# Patient Record
Sex: Female | Born: 1945 | Race: White | Hispanic: No | Smoking: Never smoker
Health system: Southern US, Community
[De-identification: ages and names within clinical notes are randomized; demographics above are authoritative.]

## PROBLEM LIST (undated history)

## (undated) DIAGNOSIS — M069 Rheumatoid arthritis, unspecified: Secondary | ICD-10-CM

## (undated) DIAGNOSIS — D369 Benign neoplasm, unspecified site: Secondary | ICD-10-CM

## (undated) DIAGNOSIS — M810 Age-related osteoporosis without current pathological fracture: Secondary | ICD-10-CM

## (undated) DIAGNOSIS — G809 Cerebral palsy, unspecified: Secondary | ICD-10-CM

## (undated) DIAGNOSIS — I10 Essential (primary) hypertension: Secondary | ICD-10-CM

## (undated) DIAGNOSIS — M1990 Unspecified osteoarthritis, unspecified site: Secondary | ICD-10-CM

## (undated) DIAGNOSIS — J189 Pneumonia, unspecified organism: Secondary | ICD-10-CM

## (undated) DIAGNOSIS — I509 Heart failure, unspecified: Secondary | ICD-10-CM

## (undated) DIAGNOSIS — S7290XA Unspecified fracture of unspecified femur, initial encounter for closed fracture: Secondary | ICD-10-CM

## (undated) DIAGNOSIS — Z9289 Personal history of other medical treatment: Secondary | ICD-10-CM

## (undated) HISTORY — PX: COLONOSCOPY: SHX174

## (undated) HISTORY — DX: Pneumonia, unspecified organism: J18.9

## (undated) HISTORY — PX: TOTAL KNEE ARTHROPLASTY: SHX125

## (undated) HISTORY — PX: JOINT REPLACEMENT: SHX530

## (undated) HISTORY — PX: CORONARY ANGIOPLASTY WITH STENT PLACEMENT: SHX49

## (undated) HISTORY — DX: Heart failure, unspecified: I50.9

---

## 1978-07-17 HISTORY — PX: BREAST ENHANCEMENT SURGERY: SHX7

## 1998-07-17 HISTORY — PX: HIP ARTHROPLASTY: SHX981

## 2007-09-18 DIAGNOSIS — F4321 Adjustment disorder with depressed mood: Secondary | ICD-10-CM | POA: Insufficient documentation

## 2007-09-18 DIAGNOSIS — F4320 Adjustment disorder, unspecified: Secondary | ICD-10-CM | POA: Insufficient documentation

## 2007-09-18 DIAGNOSIS — I10 Essential (primary) hypertension: Secondary | ICD-10-CM | POA: Insufficient documentation

## 2007-09-18 DIAGNOSIS — G809 Cerebral palsy, unspecified: Secondary | ICD-10-CM | POA: Insufficient documentation

## 2007-09-18 DIAGNOSIS — A6000 Herpesviral infection of urogenital system, unspecified: Secondary | ICD-10-CM | POA: Insufficient documentation

## 2008-06-02 ENCOUNTER — Ambulatory Visit: Payer: Self-pay | Admitting: Gastroenterology

## 2008-07-03 DIAGNOSIS — R87629 Unspecified abnormal cytological findings in specimens from vagina: Secondary | ICD-10-CM | POA: Insufficient documentation

## 2008-07-03 DIAGNOSIS — Z860101 Personal history of adenomatous and serrated colon polyps: Secondary | ICD-10-CM | POA: Insufficient documentation

## 2008-07-03 DIAGNOSIS — Z86010 Personal history of colonic polyps: Secondary | ICD-10-CM | POA: Insufficient documentation

## 2008-07-03 DIAGNOSIS — M810 Age-related osteoporosis without current pathological fracture: Secondary | ICD-10-CM | POA: Insufficient documentation

## 2008-07-11 ENCOUNTER — Inpatient Hospital Stay: Payer: Self-pay | Admitting: Internal Medicine

## 2008-08-11 ENCOUNTER — Ambulatory Visit: Payer: Self-pay | Admitting: Internal Medicine

## 2008-08-19 ENCOUNTER — Ambulatory Visit: Payer: Self-pay | Admitting: Internal Medicine

## 2008-10-21 ENCOUNTER — Ambulatory Visit: Payer: Self-pay | Admitting: Family Medicine

## 2008-10-21 DIAGNOSIS — I2510 Atherosclerotic heart disease of native coronary artery without angina pectoris: Secondary | ICD-10-CM | POA: Insufficient documentation

## 2008-10-21 DIAGNOSIS — R319 Hematuria, unspecified: Secondary | ICD-10-CM | POA: Insufficient documentation

## 2009-06-23 ENCOUNTER — Ambulatory Visit: Payer: Self-pay | Admitting: Gastroenterology

## 2009-09-18 DIAGNOSIS — M542 Cervicalgia: Secondary | ICD-10-CM | POA: Insufficient documentation

## 2009-11-29 ENCOUNTER — Ambulatory Visit: Payer: Self-pay | Admitting: Family Medicine

## 2009-11-29 DIAGNOSIS — J309 Allergic rhinitis, unspecified: Secondary | ICD-10-CM | POA: Insufficient documentation

## 2011-10-09 ENCOUNTER — Ambulatory Visit: Payer: Self-pay | Admitting: Cardiology

## 2013-04-07 DIAGNOSIS — I890 Lymphedema, not elsewhere classified: Secondary | ICD-10-CM | POA: Insufficient documentation

## 2013-05-23 LAB — HM MAMMOGRAPHY: HM MAMMO: NORMAL

## 2013-06-02 ENCOUNTER — Ambulatory Visit: Payer: Self-pay | Admitting: Gastroenterology

## 2013-06-02 LAB — HM COLONOSCOPY

## 2013-06-03 LAB — PATHOLOGY REPORT

## 2013-10-07 DIAGNOSIS — M059 Rheumatoid arthritis with rheumatoid factor, unspecified: Secondary | ICD-10-CM | POA: Insufficient documentation

## 2013-10-07 DIAGNOSIS — M545 Low back pain: Secondary | ICD-10-CM

## 2013-10-07 DIAGNOSIS — G8929 Other chronic pain: Secondary | ICD-10-CM | POA: Insufficient documentation

## 2013-10-07 DIAGNOSIS — G809 Cerebral palsy, unspecified: Secondary | ICD-10-CM | POA: Insufficient documentation

## 2013-12-19 LAB — HM PAP SMEAR: HM Pap smear: NORMAL

## 2014-11-18 DIAGNOSIS — M069 Rheumatoid arthritis, unspecified: Secondary | ICD-10-CM | POA: Insufficient documentation

## 2014-11-18 DIAGNOSIS — W19XXXA Unspecified fall, initial encounter: Secondary | ICD-10-CM | POA: Insufficient documentation

## 2014-11-18 DIAGNOSIS — E559 Vitamin D deficiency, unspecified: Secondary | ICD-10-CM | POA: Insufficient documentation

## 2014-11-18 DIAGNOSIS — E038 Other specified hypothyroidism: Secondary | ICD-10-CM | POA: Insufficient documentation

## 2014-11-18 DIAGNOSIS — Z6823 Body mass index (BMI) 23.0-23.9, adult: Secondary | ICD-10-CM | POA: Insufficient documentation

## 2014-11-18 DIAGNOSIS — M19049 Primary osteoarthritis, unspecified hand: Secondary | ICD-10-CM | POA: Insufficient documentation

## 2014-11-18 DIAGNOSIS — G4700 Insomnia, unspecified: Secondary | ICD-10-CM | POA: Insufficient documentation

## 2014-11-18 DIAGNOSIS — R32 Unspecified urinary incontinence: Secondary | ICD-10-CM | POA: Insufficient documentation

## 2014-11-18 DIAGNOSIS — E039 Hypothyroidism, unspecified: Secondary | ICD-10-CM | POA: Insufficient documentation

## 2014-11-18 DIAGNOSIS — E7800 Pure hypercholesterolemia, unspecified: Secondary | ICD-10-CM | POA: Insufficient documentation

## 2015-01-21 ENCOUNTER — Ambulatory Visit (INDEPENDENT_AMBULATORY_CARE_PROVIDER_SITE_OTHER): Payer: Medicare PPO | Admitting: Family Medicine

## 2015-01-21 ENCOUNTER — Encounter: Payer: Self-pay | Admitting: Family Medicine

## 2015-01-21 VITALS — BP 116/74 | HR 76 | Temp 97.5°F | Resp 16 | Wt 118.0 lb

## 2015-01-21 DIAGNOSIS — N393 Stress incontinence (female) (male): Secondary | ICD-10-CM

## 2015-01-21 DIAGNOSIS — E039 Hypothyroidism, unspecified: Secondary | ICD-10-CM

## 2015-01-21 DIAGNOSIS — I10 Essential (primary) hypertension: Secondary | ICD-10-CM | POA: Diagnosis not present

## 2015-01-21 DIAGNOSIS — M069 Rheumatoid arthritis, unspecified: Secondary | ICD-10-CM

## 2015-01-21 DIAGNOSIS — E038 Other specified hypothyroidism: Secondary | ICD-10-CM | POA: Diagnosis not present

## 2015-01-21 DIAGNOSIS — E7800 Pure hypercholesterolemia, unspecified: Secondary | ICD-10-CM

## 2015-01-21 DIAGNOSIS — R110 Nausea: Secondary | ICD-10-CM | POA: Diagnosis not present

## 2015-01-21 MED ORDER — HYDROCODONE-ACETAMINOPHEN 7.5-325 MG PO TABS: 2.0000 | ORAL_TABLET | Freq: Four times a day (QID) | ORAL | Status: DC

## 2015-01-21 MED ORDER — PROMETHAZINE HCL 25 MG PO TABS: 25.0000 mg | ORAL_TABLET | ORAL | Status: DC | PRN

## 2015-01-21 MED ORDER — CYCLOBENZAPRINE HCL 10 MG PO TABS
10.0000 mg | ORAL_TABLET | Freq: Three times a day (TID) | ORAL | Status: DC | PRN
Start: 2015-01-21 — End: 2015-09-10

## 2015-01-21 MED ORDER — TOLTERODINE TARTRATE ER 4 MG PO CP24: 4.0000 mg | ORAL_CAPSULE | Freq: Every day | ORAL | Status: DC
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
## 2015-01-22 ENCOUNTER — Other Ambulatory Visit: Payer: Self-pay | Admitting: Family Medicine

## 2015-01-22 DIAGNOSIS — R110 Nausea: Secondary | ICD-10-CM

## 2015-01-22 MED ORDER — PROMETHAZINE HCL 25 MG PO TABS: 25.0000 mg | ORAL_TABLET | ORAL | Status: DC | PRN

## 2015-01-23 ENCOUNTER — Other Ambulatory Visit: Payer: Self-pay | Admitting: Family Medicine

## 2015-01-23 DIAGNOSIS — F4322 Adjustment disorder with anxiety: Secondary | ICD-10-CM

## 2015-01-25 NOTE — Telephone Encounter (Signed)
Please call in rx.  Thanks.  

## 2015-02-26 ENCOUNTER — Ambulatory Visit: Payer: Self-pay | Admitting: Family Medicine

## 2015-02-26 ENCOUNTER — Telehealth: Payer: Self-pay | Admitting: Family Medicine

## 2015-03-12 ENCOUNTER — Ambulatory Visit (INDEPENDENT_AMBULATORY_CARE_PROVIDER_SITE_OTHER): Payer: Medicare PPO | Admitting: Family Medicine

## 2015-03-12 ENCOUNTER — Encounter: Payer: Self-pay | Admitting: Family Medicine

## 2015-03-12 VITALS — BP 112/70 | HR 72 | Temp 98.3°F | Resp 16 | Wt 119.0 lb

## 2015-03-12 DIAGNOSIS — R110 Nausea: Secondary | ICD-10-CM | POA: Diagnosis not present

## 2015-03-12 DIAGNOSIS — K5900 Constipation, unspecified: Secondary | ICD-10-CM

## 2015-03-12 DIAGNOSIS — R52 Pain, unspecified: Secondary | ICD-10-CM

## 2015-03-12 DIAGNOSIS — N393 Stress incontinence (female) (male): Secondary | ICD-10-CM

## 2015-03-12 DIAGNOSIS — I10 Essential (primary) hypertension: Secondary | ICD-10-CM | POA: Diagnosis not present

## 2015-03-12 DIAGNOSIS — Z5189 Encounter for other specified aftercare: Secondary | ICD-10-CM | POA: Diagnosis not present

## 2015-03-12 MED ORDER — ONDANSETRON HCL 4 MG PO TABS: 4.0000 mg | ORAL_TABLET | Freq: Three times a day (TID) | ORAL | Status: DC | PRN

## 2015-03-12 MED ORDER — SENNOSIDES-DOCUSATE SODIUM 8.6-50 MG PO TABS: 1.0000 | ORAL_TABLET | Freq: Two times a day (BID) | ORAL | Status: DC

## 2015-03-12 NOTE — Progress Notes (Signed)
Patient ID: Tina Salinas, female   DOB: August 02, 1945, 69 y.o.   MRN: [id number]         Patient: Tina Salinas Female    DOB: 24-Oct-1945   69 y.o.   MRN: [id number] Visit Date: 03/12/2015  Today's Provider: Margarita Rana, MD   Chief Complaint  Patient presents with  . Constipation  . Hypertension   Subjective:    Constipation This is a chronic problem. The problem is unchanged. The stool is described as firm. The patient is on a high fiber diet. She exercises regularly. There has been adequate water intake. Pertinent negatives include no abdominal pain, back pain, bloating, diarrhea, fever, nausea, rectal pain or vomiting. Risk factors include change in medication usage/dosage.  Hypertension This is a chronic problem. The problem is controlled. Pertinent negatives include no blurred vision, chest pain, headaches, malaise/fatigue, palpitations or shortness of breath. There are no compliance problems.   Arthritis Presents for follow-up visit. The disease course has been stable. She complains of pain and stiffness. The symptoms have been stable. Pertinent negatives include no diarrhea, fatigue or fever. Her past medical history is significant for rheumatoid arthritis. Past treatments include methotrexate and corticosteroids. The treatment provided significant relief. Compliance with prior treatments has been good.       Allergies  Allergen Reactions  . Etodolac     Other reaction(s): Unknown  . Sulfa Antibiotics   . Tetracycline    Previous Medications   ALENDRONATE (FOSAMAX) 70 MG TABLET    Take by mouth once a week.   ALPRAZOLAM (XANAX) 0.5 MG TABLET    TAKE 1/2 TO 1 TABLET AT BEDTIME   ASPIRIN 81 MG TABLET    Take 81 mg by mouth daily.   CETIRIZINE HCL 10 MG CAPS    Take by mouth daily.   CHOLECALCIFEROL (VITAMIN D) 1000 UNITS TABLET    Take by mouth daily.   CYCLOBENZAPRINE (FLEXERIL) 10 MG TABLET    Take 1 tablet (10 mg total) by mouth 3 (three) times daily as needed.   DIPHENOXYLATE-ATROPINE (LOMOTIL) 2.5-0.025 MG PER TABLET    Take by mouth as needed.   ESCITALOPRAM (LEXAPRO) 5 MG TABLET    Take by mouth daily.   FLUTICASONE (FLONASE) 50 MCG/ACT NASAL SPRAY    Place 2 sprays into the nose daily.   FOLIC ACID (FOLVITE) 1 MG TABLET    Take by mouth daily.   HYDROCODONE-ACETAMINOPHEN (NORCO) 7.5-325 MG PER TABLET    Take 2 tablets by mouth QID. 3 months supply.   HYDROXYCHLOROQUINE (PLAQUENIL) 200 MG TABLET    Take by mouth daily.   IBUPROFEN (ADVIL,MOTRIN) 800 MG TABLET    Take 1 tablet by mouth 3 (three) times daily.   METHOTREXATE 2.5 MG TABLET    Take by mouth once a week.   METOPROLOL TARTRATE (LOPRESSOR) 25 MG TABLET    Take by mouth daily.   MONTELUKAST (SINGULAIR) 10 MG TABLET    Take by mouth daily.   PRAVASTATIN (PRAVACHOL) 40 MG TABLET    Take by mouth at bedtime.   PREDNISONE (DELTASONE) 1 MG TABLET       PROMETHAZINE (PHENERGAN) 25 MG TABLET    Take 1 tablet (25 mg total) by mouth every 4 (four) hours as needed for nausea.   TOLTERODINE (DETROL LA) 4 MG 24 HR CAPSULE    Take 1 capsule (4 mg total) by mouth daily.   VALACYCLOVIR (VALTREX) 1000 MG TABLET    as needed.  Review of Systems  Constitutional: Negative for fever, chills, malaise/fatigue, diaphoresis, activity change, appetite change, fatigue and unexpected weight change.  Eyes: Negative for blurred vision.  Respiratory: Negative for apnea, cough, choking, chest tightness, shortness of breath, wheezing and stridor.   Cardiovascular: Negative for chest pain, palpitations and leg swelling.  Gastrointestinal: Positive for constipation. Negative for nausea, vomiting, abdominal pain, diarrhea, blood in stool, abdominal distention, anal bleeding, rectal pain and bloating.  Musculoskeletal: Positive for arthritis and stiffness. Negative for back pain.  Neurological: Negative for dizziness, tremors, seizures, syncope, facial asymmetry, speech difficulty, weakness, light-headedness, numbness and  headaches.    Social History  Substance Use Topics  . Smoking status: Never Smoker   . Smokeless tobacco: Never Used  . Alcohol Use: No   Objective:   BP 112/70 mmHg  Pulse 72  Temp(Src) 98.3 F (36.8 C) (Oral)  Resp 16  Wt 119 lb (53.978 kg)  Physical Exam  Constitutional: She is oriented to person, place, and time. She appears well-developed and well-nourished.  Neurological: She is alert and oriented to person, place, and time. She has normal reflexes.  Psychiatric: She has a normal mood and affect. Her behavior is normal. Judgment and thought content normal.      Assessment & Plan:     1. Essential (primary) hypertension Condition is stable. Please continue current medication and  plan of care as noted.    2. Constipation, unspecified constipation type Stable on improved medication.  Continue current medication.   - senna-docusate (SENOKOT-S) 8.6-50 MG per tablet; Take 1 tablet by mouth 2 (two) times daily.  Dispense: 60 tablet; Refill: 5  3. Stress incontinence Unchanged.   4. Pain management Condition is stable. Please continue current medication and  plan of care as noted.   - Pain Management Screening Profile (10S)  5. Nausea Refill medication.   - ondansetron (ZOFRAN) 4 MG tablet; Take 1 tablet (4 mg total) by mouth every 8 (eight) hours as needed for nausea or vomiting.  Dispense: 20 tablet; Refill: 0      Margarita Rana, MD  [facility]

## 2015-03-13 LAB — PMP SCREEN PROFILE (10S), URINE
Amphetamine Screen, Ur: NEGATIVE ng/mL
BENZODIAZEPINE SCREEN, URINE: NEGATIVE ng/mL
Barbiturate Screen, Ur: NEGATIVE ng/mL
CREATININE(CRT), U: 50.5 mg/dL (ref 20.0–300.0)
Cannabinoids Ur Ql Scn: NEGATIVE ng/mL
Cocaine(Metab.)Screen, Urine: NEGATIVE ng/mL
Methadone Scn, Ur: NEGATIVE ng/mL
OXYCODONE+OXYMORPHONE UR QL SCN: NEGATIVE ng/mL
Opiate Scrn, Ur: POSITIVE ng/mL
PCP Scrn, Ur: NEGATIVE ng/mL
PROPOXYPHENE SCREEN: NEGATIVE ng/mL
Ph of Urine: 6.8 (ref 4.5–8.9)

## 2015-03-15 ENCOUNTER — Telehealth: Payer: Self-pay

## 2015-03-15 NOTE — Telephone Encounter (Signed)
Patient advised as directed below. Patient requested a copy. Copy printed, up front for pick up.

## 2015-03-15 NOTE — Telephone Encounter (Signed)
-----   Message from Margarita Rana, MD sent at 03/13/2015  2:08 PM EDT ----- Urine tox screen is as expected. Thanks.

## 2015-03-18 ENCOUNTER — Telehealth: Payer: Self-pay | Admitting: Family Medicine

## 2015-03-18 NOTE — Telephone Encounter (Signed)
Pt advised.  She will be due for her flu shot.  She will call back and schedule soon.   Thanks,   -Mickel Baas

## 2015-03-18 NOTE — Telephone Encounter (Signed)
Pt ask Mickel Baas to call her back regarding her pneumonia and flu vaccines.  CAll back is [phone]  Thanks Con Memos

## 2015-03-19 ENCOUNTER — Other Ambulatory Visit: Payer: Self-pay

## 2015-03-19 DIAGNOSIS — A6000 Herpesviral infection of urogenital system, unspecified: Secondary | ICD-10-CM

## 2015-03-19 MED ORDER — ACYCLOVIR 800 MG PO TABS: 800.0000 mg | ORAL_TABLET | Freq: Two times a day (BID) | ORAL | Status: DC

## 2015-03-19 NOTE — Telephone Encounter (Signed)
Pt called and wanted to let Mickel Baas know that she spoke with someone from the mail order pharmacy and she has one refill left and they are going to mail it to her. I asked what medication this was in regards to and she stated she doesn't know how to say it or spell it, just to let Mickel Baas know. Thanks TNP

## 2015-03-29 ENCOUNTER — Telehealth: Payer: Self-pay | Admitting: Family Medicine

## 2015-03-29 NOTE — Telephone Encounter (Signed)
Pt is requesting a call back from Mickel Baas to discuss her medications.  CB#[phone]/MW

## 2015-03-30 NOTE — Telephone Encounter (Signed)
Pt says her Acyclovir 800mg  1 three times a day for five days as needed and she says she normally gets 270 at a time.    Thanks,   -Mickel Baas

## 2015-03-30 NOTE — Telephone Encounter (Signed)
Pt stated that she needs Mickel Baas to call her back today because pt needs Mickel Baas to contact her mail order. Thanks TNP

## 2015-03-30 NOTE — Telephone Encounter (Signed)
LMTCB 03/30/2015   Thanks,   -Mickel Baas

## 2015-03-31 ENCOUNTER — Other Ambulatory Visit: Payer: Self-pay

## 2015-03-31 DIAGNOSIS — I10 Essential (primary) hypertension: Secondary | ICD-10-CM

## 2015-03-31 DIAGNOSIS — M069 Rheumatoid arthritis, unspecified: Secondary | ICD-10-CM

## 2015-03-31 MED ORDER — IBUPROFEN 800 MG PO TABS: 800.0000 mg | ORAL_TABLET | Freq: Three times a day (TID) | ORAL | Status: DC

## 2015-03-31 MED ORDER — METOPROLOL TARTRATE 25 MG PO TABS: 25.0000 mg | ORAL_TABLET | Freq: Two times a day (BID) | ORAL | Status: DC

## 2015-04-08 ENCOUNTER — Ambulatory Visit: Payer: Self-pay

## 2015-04-12 DIAGNOSIS — Z96659 Presence of unspecified artificial knee joint: Secondary | ICD-10-CM | POA: Insufficient documentation

## 2015-04-12 DIAGNOSIS — T849XXA Unspecified complication of internal orthopedic prosthetic device, implant and graft, initial encounter: Secondary | ICD-10-CM | POA: Insufficient documentation

## 2015-04-12 DIAGNOSIS — R269 Unspecified abnormalities of gait and mobility: Secondary | ICD-10-CM | POA: Insufficient documentation

## 2015-04-17 ENCOUNTER — Ambulatory Visit (INDEPENDENT_AMBULATORY_CARE_PROVIDER_SITE_OTHER): Payer: Medicare PPO

## 2015-04-17 DIAGNOSIS — Z23 Encounter for immunization: Secondary | ICD-10-CM

## 2015-05-14 ENCOUNTER — Ambulatory Visit (INDEPENDENT_AMBULATORY_CARE_PROVIDER_SITE_OTHER): Payer: Medicare PPO | Admitting: Family Medicine

## 2015-05-14 ENCOUNTER — Telehealth: Payer: Self-pay | Admitting: Family Medicine

## 2015-05-14 ENCOUNTER — Encounter: Payer: Self-pay | Admitting: Family Medicine

## 2015-05-14 VITALS — BP 116/72 | HR 80 | Temp 98.3°F | Resp 16 | Wt 120.0 lb

## 2015-05-14 DIAGNOSIS — M059 Rheumatoid arthritis with rheumatoid factor, unspecified: Secondary | ICD-10-CM

## 2015-05-14 DIAGNOSIS — M05749 Rheumatoid arthritis with rheumatoid factor of unspecified hand without organ or systems involvement: Secondary | ICD-10-CM

## 2015-05-14 DIAGNOSIS — M158 Other polyosteoarthritis: Secondary | ICD-10-CM | POA: Diagnosis not present

## 2015-05-14 DIAGNOSIS — M1990 Unspecified osteoarthritis, unspecified site: Secondary | ICD-10-CM | POA: Insufficient documentation

## 2015-05-14 MED ORDER — HYDROCODONE-ACETAMINOPHEN 7.5-325 MG PO TABS: 2.0000 | ORAL_TABLET | Freq: Four times a day (QID) | ORAL | Status: DC

## 2015-05-14 NOTE — Telephone Encounter (Signed)
LMTCB 05/14/2015  Thanks,   -Mickel Baas

## 2015-05-14 NOTE — Telephone Encounter (Signed)
Please triage. Thanks.

## 2015-05-14 NOTE — Telephone Encounter (Signed)
Pt just left office but forgot to tell you that she was having some ear pain,   Please call back at  [phone]  Thanks Con Memos

## 2015-05-14 NOTE — Progress Notes (Signed)
Patient ID: Tina Salinas, female   DOB: Sep 12, 1945, 69 y.o.   MRN: [id number]         Patient: Tina Salinas Female    DOB: 07-05-1946   69 y.o.   MRN: [id number] Visit Date: 05/14/2015  Today's Provider: Margarita Rana, MD   Chief Complaint  Patient presents with  . Rheumatoid Arthritis   Subjective:    Arthritis Presents for follow-up visit. The disease course has been stable. She complains of pain. She reports no joint swelling. Affected locations include the left knee and right hip (did see specialist and said nothing could be done. ). Pertinent negatives include no diarrhea, dry eyes, dry mouth, dysuria, fatigue, fever, pain at night, pain while resting, rash, Raynaud's syndrome, uveitis or weight loss. Her past medical history is significant for osteoarthritis and rheumatoid arthritis. Past treatments include methotrexate, corticosteroids and OTC med. The treatment provided mild relief. Compliance with prior treatments has been good.    Needs refill on her pain medication.  Allergies  Allergen Reactions  . Etodolac     Other reaction(s): Unknown  . Sulfa Antibiotics   . Tetracycline    Previous Medications   ACYCLOVIR (ZOVIRAX) 800 MG TABLET    Take 1 tablet (800 mg total) by mouth 2 (two) times daily.   ALENDRONATE (FOSAMAX) 70 MG TABLET    Take by mouth once a week.   ALPRAZOLAM (XANAX) 0.5 MG TABLET    TAKE 1/2 TO 1 TABLET AT BEDTIME   ASPIRIN 81 MG TABLET    Take 81 mg by mouth daily.   CETIRIZINE HCL 10 MG CAPS    Take by mouth daily.   CHOLECALCIFEROL (VITAMIN D) 1000 UNITS TABLET    Take by mouth daily.   CYCLOBENZAPRINE (FLEXERIL) 10 MG TABLET    Take 1 tablet (10 mg total) by mouth 3 (three) times daily as needed.   DIPHENOXYLATE-ATROPINE (LOMOTIL) 2.5-0.025 MG PER TABLET    Take by mouth as needed.   ESCITALOPRAM (LEXAPRO) 5 MG TABLET    Take by mouth daily.   FLUTICASONE (FLONASE) 50 MCG/ACT NASAL SPRAY    Place 2 sprays into the nose daily.   FOLIC ACID (FOLVITE)  1 MG TABLET    Take by mouth daily.   HYDROCODONE-ACETAMINOPHEN (NORCO) 7.5-325 MG PER TABLET    Take 2 tablets by mouth QID. 3 months supply.   HYDROXYCHLOROQUINE (PLAQUENIL) 200 MG TABLET    Take by mouth daily.   IBUPROFEN (ADVIL,MOTRIN) 800 MG TABLET    Take 1 tablet (800 mg total) by mouth 3 (three) times daily.   METHOTREXATE 2.5 MG TABLET    Take by mouth once a week.   METOPROLOL TARTRATE (LOPRESSOR) 25 MG TABLET    Take 1 tablet (25 mg total) by mouth 2 (two) times daily.   MONTELUKAST (SINGULAIR) 10 MG TABLET    Take by mouth daily.   ONDANSETRON (ZOFRAN) 4 MG TABLET    Take 1 tablet (4 mg total) by mouth every 8 (eight) hours as needed for nausea or vomiting.   PRAVASTATIN (PRAVACHOL) 40 MG TABLET    Take by mouth at bedtime.   PREDNISONE (DELTASONE) 1 MG TABLET       PROMETHAZINE (PHENERGAN) 25 MG TABLET    Take 1 tablet (25 mg total) by mouth every 4 (four) hours as needed for nausea.   SENNA-DOCUSATE (SENOKOT-S) 8.6-50 MG PER TABLET    Take 1 tablet by mouth 2 (two) times daily.   TOLTERODINE (DETROL  LA) 4 MG 24 HR CAPSULE    Take 1 capsule (4 mg total) by mouth daily.   VALACYCLOVIR (VALTREX) 1000 MG TABLET    as needed.    Review of Systems  Constitutional: Negative.  Negative for fever, weight loss and fatigue.  Respiratory: Negative.   Cardiovascular: Negative.   Gastrointestinal: Negative for diarrhea.  Genitourinary: Negative for dysuria.  Musculoskeletal: Positive for arthralgias (Chronic issue; under good control with medication.  ) and arthritis. Negative for myalgias, back pain, joint swelling, gait problem, neck pain and neck stiffness.  Skin: Negative for rash.  Neurological: Negative for dizziness, weakness, light-headedness and headaches.    Social History  Substance Use Topics  . Smoking status: Never Smoker   . Smokeless tobacco: Never Used  . Alcohol Use: No   Objective:   BP 116/72 mmHg  Pulse 80  Temp(Src) 98.3 F (36.8 C) (Oral)  Resp 16  Wt  120 lb (54.432 kg)  Physical Exam  Constitutional: She is oriented to person, place, and time. She appears well-developed and well-nourished.  Neurological: She is alert and oriented to person, place, and time. She exhibits abnormal muscle tone. Coordination abnormal.  Psychiatric: She has a normal mood and affect. Her behavior is normal. Judgment and thought content normal.        Assessment & Plan:    1. Seropositive rheumatoid arthritis ([place]) Continue medication as below.   2. Rheumatoid arthritis involving hand with positive rheumatoid factor, unspecified laterality (HCC) Refill pain medication and follow up with rheumatology.  - HYDROcodone-acetaminophen (NORCO) 7.5-325 MG tablet; Take 2 tablets by mouth QID. 3 months supply.  Dispense: 720 tablet; Refill: 0  3. Other osteoarthritis involving multiple joints Continue pain medication.   Margarita Rana, MD  [facility]

## 2015-06-01 ENCOUNTER — Telehealth: Payer: Self-pay | Admitting: Family Medicine

## 2015-06-01 NOTE — Telephone Encounter (Signed)
Pt states her left ear is popping and feels like it is stopped up.  Pt is requesting a name of a specialist to see about her ear if possible.  CB#[phone]/MW

## 2015-06-01 NOTE — Telephone Encounter (Signed)
Would be an ENT if nasal steroids do not work. Please see if patient want a referral. Thanks.

## 2015-06-01 NOTE — Telephone Encounter (Signed)
Tried calling; no answer.  06/01/2015   Thanks,   -Mickel Baas

## 2015-06-04 NOTE — Telephone Encounter (Signed)
LM for pt requesting to call if she is interested in ENT referral. sd

## 2015-06-22 NOTE — Telephone Encounter (Signed)
Advised pt of last year's lab results.   Thanks,   -Mickel Baas

## 2015-06-22 NOTE — Telephone Encounter (Signed)
Pt would like her last years lab results were from her physical  Call back is [phone]  Thanks teri

## 2015-06-25 ENCOUNTER — Encounter: Payer: Self-pay | Admitting: Family Medicine

## 2015-06-28 ENCOUNTER — Encounter: Payer: Self-pay | Admitting: Family Medicine

## 2015-06-28 ENCOUNTER — Ambulatory Visit (INDEPENDENT_AMBULATORY_CARE_PROVIDER_SITE_OTHER): Payer: Medicare PPO | Admitting: Family Medicine

## 2015-06-28 VITALS — BP 112/68 | HR 68 | Temp 98.3°F | Resp 16 | Wt 119.0 lb

## 2015-06-28 DIAGNOSIS — E039 Hypothyroidism, unspecified: Secondary | ICD-10-CM

## 2015-06-28 DIAGNOSIS — E038 Other specified hypothyroidism: Secondary | ICD-10-CM | POA: Diagnosis not present

## 2015-06-28 DIAGNOSIS — I10 Essential (primary) hypertension: Secondary | ICD-10-CM | POA: Diagnosis not present

## 2015-06-28 DIAGNOSIS — E7800 Pure hypercholesterolemia, unspecified: Secondary | ICD-10-CM | POA: Diagnosis not present

## 2015-06-28 DIAGNOSIS — M810 Age-related osteoporosis without current pathological fracture: Secondary | ICD-10-CM

## 2015-06-28 DIAGNOSIS — Z0000 Encounter for general adult medical examination without abnormal findings: Secondary | ICD-10-CM

## 2015-06-28 DIAGNOSIS — E559 Vitamin D deficiency, unspecified: Secondary | ICD-10-CM | POA: Diagnosis not present
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
## 2015-06-29 ENCOUNTER — Telehealth: Payer: Self-pay | Admitting: Family Medicine

## 2015-06-29 NOTE — Telephone Encounter (Signed)
Pt reports she is "Catching a cold" and wanted to know what she could take for it.  I advised her to try Vitamin C, Musinex 600mg , and Delsym OTC.  I told her if she doesn't improve or worsens that she will have to schedule an office visit.  She agreed to call if she isn't better.   Thanks,   -Mickel Baas

## 2015-06-29 NOTE — Telephone Encounter (Signed)
Pt request that Mickel Baas return her call. Thanks TNP

## 2015-06-29 NOTE — Telephone Encounter (Signed)
LMTCB 06/29/2015  Thanks,   -Mickel Baas

## 2015-07-01 NOTE — Telephone Encounter (Signed)
Pt called saying she isnt feeling any better. She would like for you to call her back.  [place]

## 2015-07-01 NOTE — Telephone Encounter (Signed)
FYI...  Pt called to report she is still having cold symptoms, she is not running fevers, not wheezing or short of breath.  She is going to continue using OTC medications to help with her symptoms.  She will call back if anything changes.   Thanks,   -Mickel Baas

## 2015-07-05 ENCOUNTER — Encounter: Payer: Self-pay | Admitting: Family Medicine

## 2015-07-15 ENCOUNTER — Telehealth: Payer: Self-pay | Admitting: Family Medicine

## 2015-07-15 NOTE — Telephone Encounter (Signed)
Pt has lost her lab slip.  Pt is requesting a new lab slip printed.  Pt would like to pick this up today if possible/MW

## 2015-07-15 NOTE — Telephone Encounter (Signed)
Lab slip printed and placed up front for patient to pick up. Patient advised.

## 2015-07-17 LAB — CBC WITH DIFFERENTIAL/PLATELET
BASOS ABS: 0 10*3/uL (ref 0.0–0.2)
Basos: 0 %
EOS (ABSOLUTE): 0.2 10*3/uL (ref 0.0–0.4)
EOS: 3 %
HEMATOCRIT: 39.7 % (ref 34.0–46.6)
HEMOGLOBIN: 13.5 g/dL (ref 11.1–15.9)
IMMATURE GRANS (ABS): 0 10*3/uL (ref 0.0–0.1)
Immature Granulocytes: 0 %
LYMPHS ABS: 1 10*3/uL (ref 0.7–3.1)
LYMPHS: 13 %
MCH: 33.5 pg — AB (ref 26.6–33.0)
MCHC: 34 g/dL (ref 31.5–35.7)
MCV: 99 fL — ABNORMAL HIGH (ref 79–97)
MONOCYTES: 9 %
Monocytes Absolute: 0.7 10*3/uL (ref 0.1–0.9)
NEUTROS ABS: 5.7 10*3/uL (ref 1.4–7.0)
Neutrophils: 75 %
Platelets: 234 10*3/uL (ref 150–379)
RBC: 4.03 x10E6/uL (ref 3.77–5.28)
RDW: 13.9 % (ref 12.3–15.4)
WBC: 7.6 10*3/uL (ref 3.4–10.8)

## 2015-07-17 LAB — LIPID PANEL
CHOL/HDL RATIO: 1.9 ratio (ref 0.0–4.4)
CHOLESTEROL TOTAL: 169 mg/dL (ref 100–199)
HDL: 87 mg/dL (ref >39)
LDL Calculated: 71 mg/dL (ref 0–99)
TRIGLYCERIDES: 56 mg/dL (ref 0–149)
VLDL Cholesterol Cal: 11 mg/dL (ref 5–40)

## 2015-07-17 LAB — COMPREHENSIVE METABOLIC PANEL
ALBUMIN: 3.9 g/dL (ref 3.6–4.8)
ALK PHOS: 90 IU/L (ref 39–117)
ALT: 11 IU/L (ref 0–32)
AST: 23 IU/L (ref 0–40)
Albumin/Globulin Ratio: 2 (ref 1.1–2.5)
BUN/Creatinine Ratio: 16 (ref 11–26)
BUN: 13 mg/dL (ref 8–27)
Bilirubin Total: 0.7 mg/dL (ref 0.0–1.2)
CO2: 23 mmol/L (ref 18–29)
Calcium: 9.4 mg/dL (ref 8.7–10.3)
Chloride: 103 mmol/L (ref 96–106)
Creatinine, Ser: 0.79 mg/dL (ref 0.57–1.00)
GFR calc Af Amer: 90 mL/min/{1.73_m2} (ref >59)
GFR, EST NON AFRICAN AMERICAN: 78 mL/min/{1.73_m2} (ref >59)
GLOBULIN, TOTAL: 2 g/dL (ref 1.5–4.5)
GLUCOSE: 75 mg/dL (ref 65–99)
POTASSIUM: 4.2 mmol/L (ref 3.5–5.2)
SODIUM: 142 mmol/L (ref 134–144)
TOTAL PROTEIN: 5.9 g/dL — AB (ref 6.0–8.5)

## 2015-07-17 LAB — TSH: TSH: 4.32 u[IU]/mL (ref 0.450–4.500)

## 2015-07-17 LAB — VITAMIN D 25 HYDROXY (VIT D DEFICIENCY, FRACTURES): Vit D, 25-Hydroxy: 29.4 ng/mL — ABNORMAL LOW (ref 30.0–100.0)

## 2015-07-20 ENCOUNTER — Other Ambulatory Visit: Payer: Self-pay

## 2015-07-20 ENCOUNTER — Other Ambulatory Visit: Payer: Self-pay | Admitting: Family Medicine

## 2015-07-20 ENCOUNTER — Telehealth: Payer: Self-pay

## 2015-07-20 DIAGNOSIS — R05 Cough: Secondary | ICD-10-CM

## 2015-07-20 DIAGNOSIS — R059 Cough, unspecified: Secondary | ICD-10-CM

## 2015-07-20 MED ORDER — BENZONATATE 100 MG PO CAPS: ORAL_CAPSULE | ORAL | Status: DC

## 2015-07-20 NOTE — Telephone Encounter (Signed)
Pt advised as directed below.   Thanks,   -Marten Iles  

## 2015-07-20 NOTE — Telephone Encounter (Signed)
-----   Message from Margarita Rana, MD sent at 07/17/2015  8:01 AM EST ----- Labs stable except vit d right at lower limits.  Add more food containing D like fortified milk products or low dose supplement, maybe 500 iu a day. Thanks.

## 2015-07-22 ENCOUNTER — Other Ambulatory Visit: Payer: Self-pay | Admitting: Family Medicine

## 2015-07-22 DIAGNOSIS — R05 Cough: Secondary | ICD-10-CM

## 2015-07-22 DIAGNOSIS — R059 Cough, unspecified: Secondary | ICD-10-CM

## 2015-07-22 MED ORDER — BENZONATATE 100 MG PO CAPS: ORAL_CAPSULE | ORAL | Status: DC

## 2015-07-22 NOTE — Telephone Encounter (Signed)
Sent medication in to pharmacy.

## 2015-07-22 NOTE — Telephone Encounter (Signed)
Pt called back.    Please return [phone]  Thanks teri

## 2015-07-22 NOTE — Telephone Encounter (Signed)
Tina Salinas with [facility] would like Tina Salinas to return his call b/c pt had called the pharmacy stating that we were going to send in an RX for benzonatate (TESSALON) 100 MG capsule and wanted to know if plans had changed. It looks like the RX was sent to [facility] on 07/20/15 instead of [facility]. Please advise. Thanks TNP

## 2015-08-23 ENCOUNTER — Other Ambulatory Visit: Payer: Self-pay | Admitting: Family Medicine

## 2015-08-23 DIAGNOSIS — F4322 Adjustment disorder with anxiety: Secondary | ICD-10-CM

## 2015-08-23 MED ORDER — ALPRAZOLAM 0.5 MG PO TABS: ORAL_TABLET | ORAL | Status: DC

## 2015-08-23 NOTE — Telephone Encounter (Signed)
Pt contacted office for refill request on the following medications:  ALPRAZolam (XANAX) 0.5 MG tablet.  90 day supply.  [facility] mail order.  CB#[phone]/MW

## 2015-08-23 NOTE — Telephone Encounter (Signed)
Printed, please fax or call in to pharmacy. Thank you.   

## 2015-09-10 ENCOUNTER — Ambulatory Visit (INDEPENDENT_AMBULATORY_CARE_PROVIDER_SITE_OTHER): Payer: Medicare PPO | Admitting: Family Medicine

## 2015-09-10 ENCOUNTER — Encounter: Payer: Self-pay | Admitting: Family Medicine

## 2015-09-10 VITALS — BP 114/68 | HR 72 | Temp 98.5°F | Resp 16 | Wt 118.0 lb

## 2015-09-10 DIAGNOSIS — M05749 Rheumatoid arthritis with rheumatoid factor of unspecified hand without organ or systems involvement: Secondary | ICD-10-CM | POA: Diagnosis not present

## 2015-09-10 DIAGNOSIS — F4322 Adjustment disorder with anxiety: Secondary | ICD-10-CM | POA: Diagnosis not present

## 2015-09-10 DIAGNOSIS — A6000 Herpesviral infection of urogenital system, unspecified: Secondary | ICD-10-CM | POA: Diagnosis not present

## 2015-09-10 DIAGNOSIS — T84099D Other mechanical complication of unspecified internal joint prosthesis, subsequent encounter: Secondary | ICD-10-CM

## 2015-09-10 MED ORDER — ACYCLOVIR 800 MG PO TABS: 800.0000 mg | ORAL_TABLET | Freq: Three times a day (TID) | ORAL | Status: DC | PRN

## 2015-09-10 MED ORDER — CYCLOBENZAPRINE HCL 10 MG PO TABS
10.0000 mg | ORAL_TABLET | Freq: Three times a day (TID) | ORAL | Status: DC | PRN
Start: 2015-09-10 — End: 2015-10-07

## 2015-09-10 MED ORDER — ALPRAZOLAM 0.5 MG PO TABS: ORAL_TABLET | ORAL | Status: DC

## 2015-09-10 MED ORDER — HYDROCODONE-ACETAMINOPHEN 7.5-325 MG PO TABS: 2.0000 | ORAL_TABLET | Freq: Four times a day (QID) | ORAL | Status: DC

## 2015-09-10 MED ORDER — IBUPROFEN 800 MG PO TABS: 800.0000 mg | ORAL_TABLET | Freq: Three times a day (TID) | ORAL | Status: DC

## 2015-09-10 MED ORDER — HYDROCODONE-ACETAMINOPHEN 7.5-325 MG PO TABS: 2.0000 | ORAL_TABLET | Freq: Three times a day (TID) | ORAL | Status: DC

## 2015-09-10 NOTE — Progress Notes (Signed)
Patient ID: Tina Salinas, female   DOB: 1945/12/23, 70 y.o.   MRN: [id number]        Patient: Tina Salinas Female    DOB: 11/29/1945   70 y.o.   MRN: [id number] Visit Date: 09/10/2015  Today's Provider: Margarita Rana, MD   Chief Complaint  Patient presents with  . Arthritis   Subjective:    Arthritis Presents for follow-up visit. The disease course has been stable. She complains of pain and joint swelling. (Under good control with current medications.) The symptoms have been stable. Pertinent negatives include no fatigue, fever, rash or Raynaud's syndrome. Her past medical history is significant for osteoarthritis and rheumatoid arthritis. Past treatments include methotrexate, NSAIDs and an opioid. The treatment provided significant relief. Compliance with prior treatments has been good.       Allergies  Allergen Reactions  . Etodolac     Other reaction(s): Unknown  . Sulfa Antibiotics   . Tetracycline    Previous Medications   ACYCLOVIR (ZOVIRAX) 800 MG TABLET    Take 1 tablet (800 mg total) by mouth 2 (two) times daily.   ALENDRONATE (FOSAMAX) 70 MG TABLET    Take by mouth once a week.   ALPRAZOLAM (XANAX) 0.5 MG TABLET    TAKE 1/2 TO 1 TABLET AT BEDTIME   ASPIRIN 81 MG TABLET    Take 81 mg by mouth daily.   BENZONATATE (TESSALON) 100 MG CAPSULE    Take 1-2 every four hours as needed for cough.   CETIRIZINE HCL 10 MG CAPS    Take by mouth daily.   CHOLECALCIFEROL (VITAMIN D) 1000 UNITS TABLET    Take by mouth daily.   CYCLOBENZAPRINE (FLEXERIL) 10 MG TABLET    Take 1 tablet (10 mg total) by mouth 3 (three) times daily as needed.   DIPHENOXYLATE-ATROPINE (LOMOTIL) 2.5-0.025 MG PER TABLET    Take by mouth as needed.   ESCITALOPRAM (LEXAPRO) 5 MG TABLET    Take by mouth daily.   FLUTICASONE (FLONASE) 50 MCG/ACT NASAL SPRAY    Place 2 sprays into the nose daily.   FOLIC ACID (FOLVITE) 1 MG TABLET    Take by mouth daily.   HYDROCODONE-ACETAMINOPHEN (NORCO) 7.5-325 MG TABLET     Take 2 tablets by mouth QID. 3 months supply.   HYDROXYCHLOROQUINE (PLAQUENIL) 200 MG TABLET    Take by mouth daily.   IBUPROFEN (ADVIL,MOTRIN) 800 MG TABLET    Take 1 tablet (800 mg total) by mouth 3 (three) times daily.   METHOTREXATE 2.5 MG TABLET    Take by mouth once a week.   METOPROLOL TARTRATE (LOPRESSOR) 25 MG TABLET    Take 1 tablet (25 mg total) by mouth 2 (two) times daily.   MONTELUKAST (SINGULAIR) 10 MG TABLET    Take by mouth daily.   ONDANSETRON (ZOFRAN) 4 MG TABLET    Take 1 tablet (4 mg total) by mouth every 8 (eight) hours as needed for nausea or vomiting.   PRAVASTATIN (PRAVACHOL) 40 MG TABLET    Take by mouth at bedtime.   PREDNISONE (DELTASONE) 1 MG TABLET       PROMETHAZINE (PHENERGAN) 25 MG TABLET    Take 1 tablet (25 mg total) by mouth every 4 (four) hours as needed for nausea.   SENNA-DOCUSATE (SENOKOT-S) 8.6-50 MG PER TABLET    Take 1 tablet by mouth 2 (two) times daily.   TOLTERODINE (DETROL LA) 4 MG 24 HR CAPSULE    Take 1 capsule (4  mg total) by mouth daily.   VALACYCLOVIR (VALTREX) 1000 MG TABLET    as needed.    Review of Systems  Constitutional: Negative.  Negative for fever and fatigue.  Respiratory: Negative.   Cardiovascular: Negative.   Gastrointestinal: Negative.   Musculoskeletal: Positive for back pain, joint swelling, arthralgias and arthritis. Negative for myalgias, gait problem, neck pain and neck stiffness.       History of RA; pt reports symptoms are under good control with current medications.   Skin: Negative for rash.  Neurological: Negative for dizziness, light-headedness and headaches.    Social History  Substance Use Topics  . Smoking status: Never Smoker   . Smokeless tobacco: Never Used  . Alcohol Use: No   Objective:   BP 114/68 mmHg  Pulse 72  Temp(Src) 98.5 F (36.9 C) (Oral)  Resp 16  Wt 118 lb (53.524 kg)  Physical Exam      Assessment & Plan:     1. Rheumatoid arthritis involving hand with positive rheumatoid  factor, unspecified laterality ([place]) Continue current medication. Will attempt to decrease medication.   Recheck in 3 months.    - ibuprofen (ADVIL,MOTRIN) 800 MG tablet; Take 1 tablet (800 mg total) by mouth 3 (three) times daily.  Dispense: 270 tablet; Refill: 1 - cyclobenzaprine (FLEXERIL) 10 MG tablet; Take 1 tablet (10 mg total) by mouth 3 (three) times daily as needed.  Dispense: 270 tablet; Refill: 1 - HYDROcodone-acetaminophen (NORCO) 7.5-325 MG tablet; Take 2 tablets by mouth QID. 3 months supply.  Dispense: 720 tablet; Refill: 0  2. Genital herpes Refill medication.   - acyclovir (ZOVIRAX) 800 MG tablet; Take 1 tablet (800 mg total) by mouth 3 (three) times daily as needed.  Dispense: 270 tablet; Refill: 1  3. Adjustment disorder with anxious mood Continue current medication  - ALPRAZolam (XANAX) 0.5 MG tablet; TAKE 1/2 TO 1 TABLET AT BEDTIME  Dispense: 90 tablet; Refill: 1     Patient was seen and examined by Jerrell Belfast, MD, and note scribed by Ashley Royalty, CMA.  I have reviewed the document for accuracy and completeness and I agree with above. - Jerrell Belfast, MD   Margarita Rana, MD  [facility]

## 2015-10-07 ENCOUNTER — Telehealth: Payer: Self-pay | Admitting: Family Medicine

## 2015-10-07 ENCOUNTER — Other Ambulatory Visit: Payer: Self-pay | Admitting: Family Medicine

## 2015-10-07 DIAGNOSIS — M05749 Rheumatoid arthritis with rheumatoid factor of unspecified hand without organ or systems involvement: Secondary | ICD-10-CM

## 2015-10-07 DIAGNOSIS — F4322 Adjustment disorder with anxiety: Secondary | ICD-10-CM

## 2015-10-07 MED ORDER — ALPRAZOLAM 0.5 MG PO TABS: ORAL_TABLET | ORAL | Status: DC

## 2015-10-07 MED ORDER — CYCLOBENZAPRINE HCL 10 MG PO TABS: 10.0000 mg | ORAL_TABLET | Freq: Three times a day (TID) | ORAL | Status: DC | PRN

## 2015-10-07 NOTE — Telephone Encounter (Signed)
Printed, please fax or call in to pharmacy. Thank you.   

## 2015-10-07 NOTE — Telephone Encounter (Signed)
Pt contacted office for refill request on the following medications:  ALPRAZolam (XANAX) 0.5 MG tablet.  [facility].  CB#[phone]/MW   Pt states the mail order has not mailed this yet so she is requesting this resent to the local pharmacy.

## 2015-10-07 NOTE — Telephone Encounter (Signed)
Pt contacted office for refill request on the following medications:  cyclobenzaprine (FLEXERIL) 10 MG tablet.  [facility].  CB#[phone]/MW  Pt states this was sent to [facility] order but should be sent to local pharmacy.

## 2015-11-29 ENCOUNTER — Other Ambulatory Visit: Payer: Self-pay | Admitting: Family Medicine

## 2015-11-29 DIAGNOSIS — E7800 Pure hypercholesterolemia, unspecified: Secondary | ICD-10-CM

## 2015-11-29 MED ORDER — PRAVASTATIN SODIUM 40 MG PO TABS: 40.0000 mg | ORAL_TABLET | Freq: Every day | ORAL | Status: DC

## 2015-11-29 NOTE — Telephone Encounter (Signed)
Pt contacted office for refill request on the following medications:  pravastatin (PRAVACHOL) 40 MG tablet.  [facility] mail order.  CB#[phone]/MW

## 2015-11-30 NOTE — Telephone Encounter (Signed)
Opened in error

## 2015-12-10 ENCOUNTER — Ambulatory Visit (INDEPENDENT_AMBULATORY_CARE_PROVIDER_SITE_OTHER): Payer: Medicare PPO | Admitting: Family Medicine

## 2015-12-10 ENCOUNTER — Encounter: Payer: Self-pay | Admitting: Family Medicine

## 2015-12-10 VITALS — BP 108/64 | HR 84 | Temp 98.7°F | Resp 16 | Wt 117.0 lb

## 2015-12-10 DIAGNOSIS — I10 Essential (primary) hypertension: Secondary | ICD-10-CM | POA: Diagnosis not present

## 2015-12-10 DIAGNOSIS — J309 Allergic rhinitis, unspecified: Secondary | ICD-10-CM

## 2015-12-10 DIAGNOSIS — M05749 Rheumatoid arthritis with rheumatoid factor of unspecified hand without organ or systems involvement: Secondary | ICD-10-CM | POA: Diagnosis not present

## 2015-12-10 DIAGNOSIS — M05741 Rheumatoid arthritis with rheumatoid factor of right hand without organ or systems involvement: Secondary | ICD-10-CM | POA: Diagnosis not present

## 2015-12-10 DIAGNOSIS — E7800 Pure hypercholesterolemia, unspecified: Secondary | ICD-10-CM | POA: Diagnosis not present

## 2015-12-10 DIAGNOSIS — M79676 Pain in unspecified toe(s): Secondary | ICD-10-CM

## 2015-12-10 MED ORDER — METOPROLOL TARTRATE 25 MG PO TABS: 12.5000 mg | ORAL_TABLET | Freq: Two times a day (BID) | ORAL | Status: DC

## 2015-12-10 NOTE — Progress Notes (Signed)
Patient ID: REES OBENOUR, female   DOB: [birth year], 70 y.o.   MRN: [id number]         Patient: EMILYNN Salinas Female    DOB: Sep 26, 1945   70 y.o.   MRN: [id number] Visit Date: 12/10/2015  Today's Provider: Margarita Rana, MD   Chief Complaint  Patient presents with  . Arthritis  . Hypertension   Subjective:    Arthritis Presents for follow-up visit. The disease course has been worsening (Pt is having a RA flare in her hand.  She has an appointment with Dr. Jefm Bryant on Wednesday.). She complains of pain, stiffness and joint swelling. Affected location: Left Hand. Pertinent negatives include no diarrhea, dry eyes, dry mouth, dysuria, fatigue, fever, pain at night, pain while resting, rash, Raynaud's syndrome, uveitis or weight loss. Her past medical history is significant for rheumatoid arthritis. Past treatments include methotrexate, NSAIDs and an opioid. Compliance with prior treatments has been good.  Hypertension This is a chronic problem. The problem is unchanged. The problem is controlled. Associated symptoms include anxiety (Pt reports she is under stress this week. ). Pertinent negatives include no blurred vision, chest pain, headaches, malaise/fatigue, neck pain, orthopnea, palpitations, peripheral edema, PND, shortness of breath or sweats. There are no compliance problems.        Allergies  Allergen Reactions  . Etodolac     Other reaction(s): Unknown  . Sulfa Antibiotics   . Tetracycline    Previous Medications   ACYCLOVIR (ZOVIRAX) 800 MG TABLET    Take 1 tablet (800 mg total) by mouth 3 (three) times daily as needed.   ALENDRONATE (FOSAMAX) 70 MG TABLET    Take by mouth once a week.   ALPRAZOLAM (XANAX) 0.5 MG TABLET    TAKE 1/2 TO 1 TABLET AT BEDTIME   ASPIRIN 81 MG TABLET    Take 81 mg by mouth daily.   CHOLECALCIFEROL (VITAMIN D) 1000 UNITS TABLET    Take by mouth daily.   CYCLOBENZAPRINE (FLEXERIL) 10 MG TABLET    Take 1 tablet (10 mg total) by mouth 3 (three) times  daily as needed.   FOLIC ACID (FOLVITE) 1 MG TABLET    Take by mouth daily.   HYDROCODONE-ACETAMINOPHEN (NORCO) 7.5-325 MG TABLET    Take 2 tablets by mouth 3 (three) times daily. 3 months supply.   HYDROXYCHLOROQUINE (PLAQUENIL) 200 MG TABLET    Take by mouth daily.   IBUPROFEN (ADVIL,MOTRIN) 800 MG TABLET    Take 1 tablet (800 mg total) by mouth 3 (three) times daily.   METHOTREXATE 2.5 MG TABLET    Take by mouth once a week.   METOPROLOL TARTRATE (LOPRESSOR) 25 MG TABLET    Take 1 tablet (25 mg total) by mouth 2 (two) times daily.   PRAVASTATIN (PRAVACHOL) 40 MG TABLET    Take 1 tablet (40 mg total) by mouth at bedtime.   PREDNISONE (DELTASONE) 1 MG TABLET       SENNA-DOCUSATE (SENOKOT-S) 8.6-50 MG PER TABLET    Take 1 tablet by mouth 2 (two) times daily.   TOLTERODINE (DETROL LA) 4 MG 24 HR CAPSULE    Take 1 capsule (4 mg total) by mouth daily.   VALACYCLOVIR (VALTREX) 1000 MG TABLET    as needed.    Review of Systems  Constitutional: Negative for fever, weight loss, malaise/fatigue and fatigue.  Eyes: Negative for blurred vision.  Respiratory: Negative for shortness of breath.   Cardiovascular: Negative for chest pain, palpitations, orthopnea and PND.  Gastrointestinal: Negative for diarrhea.  Genitourinary: Negative for dysuria.  Musculoskeletal: Positive for joint swelling, arthritis and stiffness. Negative for neck pain.  Skin: Negative for rash.  Neurological: Negative for headaches.    Social History  Substance Use Topics  . Smoking status: Never Smoker   . Smokeless tobacco: Never Used  . Alcohol Use: No   Objective:   BP 108/64 mmHg  Pulse 84  Temp(Src) 98.7 F (37.1 C) (Oral)  Resp 16  Wt 117 lb (53.071 kg)  Physical Exam  Constitutional: She is oriented to person, place, and time. She appears well-developed and well-nourished.  Cardiovascular: Normal rate, regular rhythm and normal heart sounds.   Pulmonary/Chest: Effort normal and breath sounds normal.    Musculoskeletal: She exhibits edema (left lower extremity with some erythema.   Left great toe over 2nd digit. Callous noted.  ).  Neurological: She is alert and oriented to person, place, and time.  Skin: Skin is warm and dry.  Psychiatric: She has a normal mood and affect. Her behavior is normal. Judgment and thought content normal.      Assessment & Plan:     1. Rheumatoid arthritis involving right hand with positive rheumatoid factor (HCC) Recent flare; will follow up with Dr. Jefm Bryant on Wednesday.  2. Essential (primary) hypertension BP under good control; will reduce Metoprolol to 12.5mg  twice a day.   - metoprolol tartrate (LOPRESSOR) 25 MG tablet; Take 0.5 tablets (12.5 mg total) by mouth 2 (two) times daily.  Dispense: 90 tablet; Refill: 1  3. Hypercholesteremia Stable continue current medications.   4. Allergic rhinitis, unspecified allergic rhinitis type Pt reports allergies under good control right now.  She does not need allergy medications right now.    Patient was seen and examined by Jerrell Belfast, MD, and note scribed by Ashley Royalty, CMA.  I have reviewed the document for accuracy and completeness and I agree with above. - Jerrell Belfast, MD      Margarita Rana, MD  [facility]

## 2015-12-30 ENCOUNTER — Other Ambulatory Visit: Payer: Self-pay | Admitting: Family Medicine

## 2015-12-30 DIAGNOSIS — R05 Cough: Secondary | ICD-10-CM

## 2015-12-30 DIAGNOSIS — R059 Cough, unspecified: Secondary | ICD-10-CM

## 2015-12-30 NOTE — Telephone Encounter (Signed)
Pt is requesting a Rx to help with her cough.  [facility].  CB#[phone]/MW

## 2015-12-31 MED ORDER — BENZONATATE 100 MG PO CAPS: ORAL_CAPSULE | ORAL | Status: DC

## 2015-12-31 NOTE — Telephone Encounter (Signed)
Rx sent to [facility]  Thanks,   -laura

## 2015-12-31 NOTE — Telephone Encounter (Signed)
Ok to send in [facility]  Thanks.

## 2016-01-07 ENCOUNTER — Ambulatory Visit (INDEPENDENT_AMBULATORY_CARE_PROVIDER_SITE_OTHER): Payer: Medicare PPO | Admitting: Family Medicine

## 2016-01-07 ENCOUNTER — Encounter: Payer: Self-pay | Admitting: Family Medicine

## 2016-01-07 VITALS — BP 114/72 | HR 88 | Temp 97.6°F | Resp 16 | Wt 117.0 lb

## 2016-01-07 DIAGNOSIS — R05 Cough: Secondary | ICD-10-CM | POA: Diagnosis not present

## 2016-01-07 DIAGNOSIS — M05749 Rheumatoid arthritis with rheumatoid factor of unspecified hand without organ or systems involvement: Secondary | ICD-10-CM | POA: Diagnosis not present

## 2016-01-07 DIAGNOSIS — F4322 Adjustment disorder with anxiety: Secondary | ICD-10-CM

## 2016-01-07 DIAGNOSIS — R059 Cough, unspecified: Secondary | ICD-10-CM

## 2016-01-07 DIAGNOSIS — J019 Acute sinusitis, unspecified: Secondary | ICD-10-CM

## 2016-01-07 DIAGNOSIS — M05741 Rheumatoid arthritis with rheumatoid factor of right hand without organ or systems involvement: Secondary | ICD-10-CM | POA: Diagnosis not present

## 2016-01-07 DIAGNOSIS — M158 Other polyosteoarthritis: Secondary | ICD-10-CM | POA: Diagnosis not present

## 2016-01-07 MED ORDER — CYCLOBENZAPRINE HCL 10 MG PO TABS: 10.0000 mg | ORAL_TABLET | Freq: Three times a day (TID) | ORAL | Status: DC | PRN

## 2016-01-07 MED ORDER — AMOXICILLIN-POT CLAVULANATE 875-125 MG PO TABS
1.0000 | ORAL_TABLET | Freq: Two times a day (BID) | ORAL | Status: DC
Start: 2016-01-07 — End: 2016-05-19

## 2016-01-07 MED ORDER — PROMETHAZINE HCL 25 MG PO TABS: 25.0000 mg | ORAL_TABLET | Freq: Three times a day (TID) | ORAL | Status: DC | PRN

## 2016-01-07 MED ORDER — ALPRAZOLAM 0.5 MG PO TABS: ORAL_TABLET | ORAL | Status: DC

## 2016-01-07 MED ORDER — FLUCONAZOLE 150 MG PO TABS: 150.0000 mg | ORAL_TABLET | Freq: Once | ORAL | Status: DC

## 2016-01-07 MED ORDER — BENZONATATE 100 MG PO CAPS
ORAL_CAPSULE | ORAL | Status: DC
Start: 2016-01-07 — End: 2016-05-19

## 2016-01-07 MED ORDER — HYDROCODONE-ACETAMINOPHEN 7.5-325 MG PO TABS: 2.0000 | ORAL_TABLET | Freq: Three times a day (TID) | ORAL | Status: DC

## 2016-01-07 NOTE — Progress Notes (Signed)
Patient: Tina Salinas Female    DOB: 24-May-1946   70 y.o.   MRN: [id number] Visit Date: 01/07/2016  Today's Provider: Margarita Rana, MD   Chief Complaint  Patient presents with  . Arthritis  . Sinusitis   Subjective:    Arthritis Presents for follow-up visit. She complains of joint swelling. Pertinent negatives include no fatigue, fever or rash.  URI  This is a new problem. The current episode started 1 to 4 weeks ago. The problem has been gradually improving. Associated symptoms include congestion and coughing. Pertinent negatives include no abdominal pain, chest pain, ear pain, headaches, neck pain, rash, rhinorrhea, sinus pain, sneezing, sore throat, swollen glands or wheezing.       Allergies  Allergen Reactions  . Etodolac     Other reaction(s): Unknown  . Sulfa Antibiotics   . Tetracycline    No outpatient prescriptions have been marked as taking for the 01/07/16 encounter (Office Visit) with Margarita Rana, MD.    Review of Systems  Constitutional: Negative.  Negative for fever, chills, diaphoresis, fatigue and unexpected weight change.  HENT: Positive for congestion and postnasal drip. Negative for ear discharge, ear pain, hoarse voice, nosebleeds, rhinorrhea, sinus pressure, sneezing, sore throat, tinnitus, trouble swallowing and voice change.   Respiratory: Positive for cough. Negative for apnea, choking, chest tightness, shortness of breath and wheezing.   Cardiovascular: Negative for chest pain.  Gastrointestinal: Negative.  Negative for abdominal pain.  Musculoskeletal: Positive for joint swelling, arthralgias, gait problem and arthritis. Negative for myalgias, back pain, neck pain and neck stiffness.  Skin: Negative for rash.  Neurological: Negative for dizziness, light-headedness and headaches.    Social History  Substance Use Topics  . Smoking status: Never Smoker   . Smokeless tobacco: Never Used  . Alcohol Use: No   Objective:   BP 114/72  mmHg  Pulse 88  Temp(Src) 97.6 F (36.4 C) (Oral)  Resp 16  Wt 117 lb (53.071 kg)  Physical Exam  Constitutional: She is oriented to person, place, and time. She appears well-developed and well-nourished.  HENT:  Head: Normocephalic and atraumatic.  Right Ear: External ear normal.  Left Ear: External ear normal.  Nose: Mucosal edema present. Right sinus exhibits no maxillary sinus tenderness and no frontal sinus tenderness. Left sinus exhibits no maxillary sinus tenderness and no frontal sinus tenderness.  Mouth/Throat: Oropharynx is clear and moist.  Cardiovascular: Normal rate, regular rhythm and normal heart sounds.   Pulmonary/Chest: Effort normal and breath sounds normal.  Neurological: She is alert and oriented to person, place, and time.  Skin: Skin is warm and dry.  Psychiatric: She has a normal mood and affect. Her behavior is normal. Judgment and thought content normal.      Assessment & Plan:     1. 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 Follow up with Dr. Jefm Bryant.    2. Other osteoarthritis involving multiple joints Followed up with Dr. Jefm Bryant.    3. Acute sinusitis, recurrence not specified, unspecified location Condition is stable. Please continue current medication and  plan of care as noted.   - amoxicillin-clavulanate (AUGMENTIN) 875-125 MG tablet; Take 1 tablet by mouth 2 (two) times daily.  Dispense: 14 tablet; Refill: 0  4. 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 Cough Continue current medication.  -  benzonatate (TESSALON) 100 MG capsule; Take 1-2 every four hours as needed for cough.  Dispense: 60 capsule; Refill: 0  6. Rheumatoid arthritis involving hand with positive rheumatoid factor, unspecified laterality (HCC) Condition is stable. Please continue current medication and   plan of care as noted.   - cyclobenzaprine (FLEXERIL) 10 MG tablet; Take 1 tablet (10 mg total) by mouth 3 (three) times daily as needed.  Dispense: 270 tablet; Refill: 0 - HYDROcodone-acetaminophen (NORCO) 7.5-325 MG tablet; Take 2 tablets by mouth 3 (three) times daily. 3 months supply.  Dispense: 540 tablet; Refill: 0      Patient was seen and examined by Jerrell Belfast, MD, and note scribed by Ashley Royalty, CMA.  I have reviewed the document for accuracy and completeness and I agree with above. - Jerrell Belfast, MD   Margarita Rana, MD  [facility]

## 2016-01-11 ENCOUNTER — Other Ambulatory Visit: Payer: Self-pay | Admitting: Family Medicine

## 2016-01-11 DIAGNOSIS — I872 Venous insufficiency (chronic) (peripheral): Secondary | ICD-10-CM

## 2016-01-11 NOTE — Telephone Encounter (Signed)
Pt wants to know if ALPRAZolam Duanne Moron) 0.5 MG tablet was called into mail order.  She states she was in Friday and didn't get the ALPRAZolam Duanne Moron) 0.5 MG tablet at the local pharmacy.  She is requesting Mickel Baas call her back

## 2016-01-12 ENCOUNTER — Telehealth: Payer: Self-pay | Admitting: Family Medicine

## 2016-01-12 NOTE — Telephone Encounter (Signed)
That should be fine. Thanks

## 2016-01-12 NOTE — Telephone Encounter (Signed)
Pt's appointment with Dr Lucky Cowboy is not until 02/08/16.She is worried this may be to far out.Please advise.Pt also wanted to thank you for everything.She also wants you to know that she loves you and is going to miss you

## 2016-01-12 NOTE — Addendum Note (Signed)
Addended by: Ashley Royalty E on: 01/12/2016 01:12 PM   Modules accepted: Orders

## 2016-01-12 NOTE — Telephone Encounter (Signed)
LMTCB 01/12/2016  Thanks,   -Mickel Baas

## 2016-01-12 NOTE — Telephone Encounter (Signed)
Pt advised that she has a hard copy of Alprazolam (Printed with her Hydrocodone).  She will send that into her mail order.  Pt also wanted to talk about the redness in her legs.  She is concern that there is something vascular wrong and would like a referral to a vascular surgeon to evaluate and treat.    Thanks,   -Mickel Baas

## 2016-01-31 ENCOUNTER — Ambulatory Visit: Payer: Self-pay | Admitting: Family Medicine

## 2016-02-13 LAB — HM DEXA SCAN

## 2016-02-23 NOTE — Telephone Encounter (Signed)
error 

## 2016-03-30 ENCOUNTER — Ambulatory Visit (INDEPENDENT_AMBULATORY_CARE_PROVIDER_SITE_OTHER): Payer: Medicare PPO

## 2016-03-30 DIAGNOSIS — Z23 Encounter for immunization: Secondary | ICD-10-CM

## 2016-04-01 ENCOUNTER — Ambulatory Visit: Payer: Self-pay

## 2016-04-08 ENCOUNTER — Ambulatory Visit: Payer: Self-pay

## 2016-04-10 ENCOUNTER — Other Ambulatory Visit: Payer: Self-pay

## 2016-04-10 ENCOUNTER — Telehealth: Payer: Self-pay | Admitting: Family Medicine

## 2016-04-10 DIAGNOSIS — N393 Stress incontinence (female) (male): Secondary | ICD-10-CM

## 2016-04-10 MED ORDER — TOLTERODINE TARTRATE ER 4 MG PO CP24: 4.0000 mg | ORAL_CAPSULE | Freq: Every day | ORAL | 3 refills | Status: DC

## 2016-04-10 NOTE — Telephone Encounter (Signed)
  I believe the patient is now a Dr. Caryn Section patient. However, can you still refill for the patient? Please review. Thanks!      Tina Salinas at 04/10/2016 11:46 AM   Status: Signed    Pt contacted office for refill request on the following medications:  tolterodine (DETROL LA) 4 MG 24 hr capsule. 90 day supply.   [facility].  CB#[phone]/MW  This is a Dr Venia Minks pt/MW

## 2016-04-10 NOTE — Telephone Encounter (Signed)
Sent Tawanna Sat a message regarding the patient's refill.

## 2016-04-10 NOTE — Telephone Encounter (Signed)
Pt contacted office for refill request on the following medications:  tolterodine (DETROL LA) 4 MG 24 hr capsule. 90 day supply.   [facility].  CB#[phone]/MW  This is a Dr Venia Minks pt/MW

## 2016-04-19 ENCOUNTER — Other Ambulatory Visit: Payer: Self-pay | Admitting: Family Medicine

## 2016-04-19 DIAGNOSIS — F4322 Adjustment disorder with anxiety: Secondary | ICD-10-CM

## 2016-04-19 MED ORDER — ALPRAZOLAM 0.5 MG PO TABS: ORAL_TABLET | ORAL | 1 refills | Status: DC

## 2016-04-19 NOTE — Telephone Encounter (Signed)
Pt contacted office for refill request on the following medications:  ALPRAZolam (XANAX) 0.5 MG tablet.  Pt is requesting this resent to  [facility] mail order.  CB#[phone]/MW  This is a Dr Venia Minks pt/MW

## 2016-04-19 NOTE — Telephone Encounter (Signed)
Please call in alprazolam.  

## 2016-04-20 NOTE — Telephone Encounter (Signed)
Rx called in to pharmacy. 

## 2016-05-19 ENCOUNTER — Encounter: Payer: Self-pay | Admitting: Family Medicine

## 2016-05-19 ENCOUNTER — Ambulatory Visit (INDEPENDENT_AMBULATORY_CARE_PROVIDER_SITE_OTHER): Payer: Medicare PPO | Admitting: Family Medicine

## 2016-05-19 VITALS — BP 92/56 | HR 91 | Temp 98.0°F | Resp 16 | Wt 114.0 lb

## 2016-05-19 DIAGNOSIS — M810 Age-related osteoporosis without current pathological fracture: Secondary | ICD-10-CM | POA: Diagnosis not present

## 2016-05-19 DIAGNOSIS — N393 Stress incontinence (female) (male): Secondary | ICD-10-CM | POA: Diagnosis not present

## 2016-05-19 DIAGNOSIS — I2510 Atherosclerotic heart disease of native coronary artery without angina pectoris: Secondary | ICD-10-CM

## 2016-05-19 DIAGNOSIS — M05741 Rheumatoid arthritis with rheumatoid factor of right hand without organ or systems involvement: Secondary | ICD-10-CM

## 2016-05-19 DIAGNOSIS — M05749 Rheumatoid arthritis with rheumatoid factor of unspecified hand without organ or systems involvement: Secondary | ICD-10-CM | POA: Diagnosis not present

## 2016-05-19 DIAGNOSIS — I10 Essential (primary) hypertension: Secondary | ICD-10-CM

## 2016-05-19 MED ORDER — HYDROCODONE-ACETAMINOPHEN 7.5-325 MG PO TABS: 2.0000 | ORAL_TABLET | Freq: Three times a day (TID) | ORAL | 0 refills | Status: DC

## 2016-05-19 NOTE — Progress Notes (Signed)
Patient: Tina Salinas Female    DOB: February 17, 1946   70 y.o.   MRN: [id number] Visit Date: 05/19/2016  Today's Provider: Lelon Huh, MD   Chief Complaint  Patient presents with  . Follow-up  . Rheumatoid Arthritis   Subjective:     This is a previous patient of Dr. Venia Minks present today as new patient to me to establish care and follow up on chronic medical problems.   HPI Follow up Rheumatoid Arthritis: Patient was last seen by Dr. Venia Minks 4 months ago and no changes were made. Patient reports good compliance with treatment, good tolerance and fair symptom control. She continues routine follow up with Dr. Jefm Bryant and reports he recently changed her from low dose prednisone to methotrexate.    Follow up Adaptation Reaction: Patient was last seen for this problem 4 months ago and no changes were made. Xanax was refilled. Patient reports good compliance with treatment, good tolerance and good symptom control.    Follow up Vitamin D Deficiency: Patient was last seen 11 months ago for this problem and was started on OTC Vitamin D 500 units daily. Patient reports poor compliance with treatment. She states she has not been taking Vitamin D supplements regularly.   Follow up osteoporosis.  Reports she is still taking Fosamax and tolerating well. She states she is scheduled form BMD at Dr. Nancie Neas office next week.   Irritable bladder She states generic Detrol is working well and wishes to continue current dose unchanged. Not aware of any adverse effects.      Allergies  Allergen Reactions  . Etodolac     Other reaction(s): Unknown  . Sulfa Antibiotics   . Tetracycline      Current Outpatient Prescriptions:  .  acyclovir (ZOVIRAX) 800 MG tablet, Take 1 tablet (800 mg total) by mouth 3 (three) times daily as needed., Disp: 270 tablet, Rfl: 1 .  alendronate (FOSAMAX) 70 MG tablet, Take by mouth once a week., Disp: , Rfl:  .  ALPRAZolam (XANAX) 0.5 MG tablet, TAKE 1/2  TO 1 TABLET AT BEDTIME, Disp: 90 tablet, Rfl: 1 .  aspirin 81 MG tablet, Take 81 mg by mouth daily., Disp: , Rfl:  .  cyclobenzaprine (FLEXERIL) 10 MG tablet, Take 1 tablet (10 mg total) by mouth 3 (three) times daily as needed., Disp: 270 tablet, Rfl: 0 .  folic acid (FOLVITE) 1 MG tablet, Take by mouth daily., Disp: , Rfl:  .  HYDROcodone-acetaminophen (NORCO) 7.5-325 MG tablet, Take 2 tablets by mouth 3 (three) times daily. 3 months supply., Disp: 540 tablet, Rfl: 0 .  hydroxychloroquine (PLAQUENIL) 200 MG tablet, Take by mouth daily., Disp: , Rfl:  .  ibuprofen (ADVIL,MOTRIN) 800 MG tablet, Take 1 tablet (800 mg total) by mouth 3 (three) times daily., Disp: 270 tablet, Rfl: 1 .  Methotrexate, PF, 20 MG/0.4ML SOAJ, Inject into the skin. Inject 20 mg subcutaneously once a week., Disp: , Rfl:  .  metoprolol tartrate (LOPRESSOR) 25 MG tablet, Take 0.5 tablets (12.5 mg total) by mouth 2 (two) times daily., Disp: 90 tablet, Rfl: 1 .  pravastatin (PRAVACHOL) 40 MG tablet, Take 1 tablet (40 mg total) by mouth at bedtime., Disp: 90 tablet, Rfl: 3 .  promethazine (PHENERGAN) 25 MG tablet, Take 1 tablet (25 mg total) by mouth every 8 (eight) hours as needed for nausea or vomiting., Disp: 120 tablet, Rfl: 0 .  senna-docusate (SENOKOT-S) 8.6-50 MG per tablet, Take 1 tablet by mouth 2 (  two) times daily., Disp: 60 tablet, Rfl: 5 .  tolterodine (DETROL LA) 4 MG 24 hr capsule, Take 1 capsule (4 mg total) by mouth daily., Disp: 90 capsule, Rfl: 3 .  valACYclovir (VALTREX) 1000 MG tablet, as needed., Disp: , Rfl:  .  cholecalciferol (VITAMIN D) 1000 UNITS tablet, Take by mouth daily., Disp: , Rfl:   Review of Systems  Constitutional: Negative for appetite change, chills, fatigue and fever.  Respiratory: Negative for chest tightness and shortness of breath.   Cardiovascular: Negative for chest pain and palpitations.  Gastrointestinal: Positive for diarrhea. Negative for abdominal pain, nausea and vomiting.    Endocrine: Negative for cold intolerance, heat intolerance, polydipsia, polyphagia and polyuria.  Musculoskeletal: Positive for arthralgias and gait problem.  Neurological: Negative for dizziness and weakness.   Social History  Substance Use Topics  . Smoking status: Never Smoker  . Smokeless tobacco: Never Used  . Alcohol use No    Social History  Substance Use Topics  . Smoking status: Never Smoker  . Smokeless tobacco: Never Used  . Alcohol use No   Objective:   BP (!) 92/56 (BP Location: Left Arm, Patient Position: Sitting, Cuff Size: Normal)   Pulse 91   Temp 98 F (36.7 C) (Oral)   Resp 16   Wt 114 lb (51.7 kg)   SpO2 97% Comment: room air  BMI 21.54 kg/m   Physical Exam   General Appearance:    Alert, cooperative, no distress  Eyes:    PERRL, conjunctiva/corneas clear, EOM's intact       Lungs:     Clear to auscultation bilaterally, respirations unlabored  Heart:    Regular rate and rhythm  Neurologic:   Awake, alert, oriented x 3. No apparent focal neurological           defect.           Assessment & Plan:     1. Rheumatoid arthritis involving hand with positive rheumatoid factor, unspecified laterality (HCC) Pain adequately controlled on current medications.  - HYDROcodone-acetaminophen (NORCO) 7.5-325 MG tablet; Take 2 tablets by mouth 3 (three) times daily. 3 months supply.  Dispense: 540 tablet; Refill: 0  2. Essential (primary) hypertension Well controlled.  Continue current medications.    3. Arteriosclerosis of coronary artery Asymptomatic. Compliant with medication.  Continue aggressive risk factor modification.    4. Osteoporosis, unspecified osteoporosis type, unspecified pathological fracture presence She reports compliance with alendronate. To have BMD at Dr[facility] later this month.   5. 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 Stress incontinence of urine Continue generic detrol.         Lelon Huh, MD  [facility]

## 2016-05-26 ENCOUNTER — Ambulatory Visit: Payer: Self-pay | Admitting: Family Medicine

## 2016-05-31 ENCOUNTER — Other Ambulatory Visit: Payer: Self-pay | Admitting: Family Medicine

## 2016-05-31 DIAGNOSIS — I10 Essential (primary) hypertension: Secondary | ICD-10-CM

## 2016-05-31 DIAGNOSIS — E7800 Pure hypercholesterolemia, unspecified: Secondary | ICD-10-CM
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
## 2016-06-01 MED ORDER — PRAVASTATIN SODIUM 40 MG PO TABS
40.0000 mg | ORAL_TABLET | Freq: Every day | ORAL | 3 refills | Status: DC
Start: 2016-06-01 — End: 2016-06-01

## 2016-06-01 MED ORDER — METOPROLOL TARTRATE 25 MG PO TABS: 12.5000 mg | ORAL_TABLET | Freq: Two times a day (BID) | ORAL | 0 refills | Status: DC

## 2016-06-01 MED ORDER — METOPROLOL TARTRATE 25 MG PO TABS
12.5000 mg | ORAL_TABLET | Freq: Two times a day (BID) | ORAL | 3 refills | Status: DC
Start: 2016-06-01 — End: 2016-06-01

## 2016-06-01 MED ORDER — PRAVASTATIN SODIUM 40 MG PO TABS: 40.0000 mg | ORAL_TABLET | Freq: Every day | ORAL | 0 refills | Status: DC

## 2016-06-01 NOTE — Telephone Encounter (Signed)
Can you please cancel the refills that were sent to [facility] this morning. They have been resent to [facility]. Thanks.

## 2016-06-01 NOTE — Telephone Encounter (Signed)
Please review. KW 

## 2016-06-01 NOTE — Telephone Encounter (Signed)
Called [facility] and canceled rx's.

## 2016-06-01 NOTE — Telephone Encounter (Signed)
See request below. KW

## 2016-06-02 ENCOUNTER — Telehealth: Payer: Self-pay | Admitting: Family Medicine

## 2016-06-02 DIAGNOSIS — F4322 Adjustment disorder with anxiety: Secondary | ICD-10-CM

## 2016-06-02 MED ORDER — ALPRAZOLAM 0.5 MG PO TABS: ORAL_TABLET | ORAL | 1 refills | Status: DC

## 2016-06-02 NOTE — Telephone Encounter (Signed)
Prescription has been called in and patient has been notified. KW

## 2016-06-02 NOTE — Telephone Encounter (Signed)
Please advise 

## 2016-06-02 NOTE — Telephone Encounter (Signed)
Please call in alprazolam.  

## 2016-06-02 NOTE — Telephone Encounter (Signed)
no
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
## 2016-06-02 NOTE — Telephone Encounter (Addendum)
Patient is requesting that we send in a 3 month supply. KW

## 2016-06-15 LAB — HM DEXA SCAN

## 2016-06-30 ENCOUNTER — Encounter: Payer: Self-pay | Admitting: Family Medicine

## 2016-06-30 ENCOUNTER — Ambulatory Visit (INDEPENDENT_AMBULATORY_CARE_PROVIDER_SITE_OTHER): Payer: Medicare PPO | Admitting: Family Medicine

## 2016-06-30 VITALS — BP 88/60 | HR 81 | Temp 98.2°F | Resp 16 | Ht 61.0 in | Wt 116.0 lb

## 2016-06-30 DIAGNOSIS — E559 Vitamin D deficiency, unspecified: Secondary | ICD-10-CM

## 2016-06-30 DIAGNOSIS — Z0000 Encounter for general adult medical examination without abnormal findings: Secondary | ICD-10-CM | POA: Diagnosis not present

## 2016-06-30 DIAGNOSIS — M810 Age-related osteoporosis without current pathological fracture: Secondary | ICD-10-CM

## 2016-06-30 DIAGNOSIS — I10 Essential (primary) hypertension: Secondary | ICD-10-CM

## 2016-06-30 DIAGNOSIS — E7800 Pure hypercholesterolemia, unspecified: Secondary | ICD-10-CM

## 2016-06-30 DIAGNOSIS — E039 Hypothyroidism, unspecified: Secondary | ICD-10-CM | POA: Diagnosis not present

## 2016-06-30 DIAGNOSIS — E038 Other specified hypothyroidism: Secondary | ICD-10-CM
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
## 2016-07-07 ENCOUNTER — Telehealth: Payer: Self-pay | Admitting: Family Medicine

## 2016-07-07 NOTE — Telephone Encounter (Signed)
Pt states she has lost the lab slip that Dr Caryn Section gave her from her last appointment.  Pt will come in Tuesday to pick up another lab slip/MW

## 2016-07-07 NOTE — Telephone Encounter (Signed)
Pt informed and voiced understanding of results. 

## 2016-07-26 ENCOUNTER — Telehealth: Payer: Self-pay | Admitting: Family Medicine

## 2016-07-26 MED ORDER — FLUTICASONE PROPIONATE 50 MCG/ACT NA SUSP: 2.0000 | Freq: Every day | NASAL | 1 refills | Status: DC

## 2016-07-26 NOTE — Telephone Encounter (Signed)
LMTCB to get further information about symptoms

## 2016-07-26 NOTE — Telephone Encounter (Signed)
Patient reports that she has had a runny nose X 5 days. She reports that she has been taking Mucinex, Zyrtrec and store brand cold and flu medication to help with symptoms. Patient is requesting a nasal spray to help with her runny nose. She also wants to know how long should she continue taking the Mucinex. Please advise. Thanks!

## 2016-07-26 NOTE — Telephone Encounter (Signed)
Pt states she has a sinus congestion and runny nose.  Pt is requesting a Rx to help with this.  Pt does not want to come in for an appointment.  [facility]  CB#[phone]/MW

## 2016-09-05 ENCOUNTER — Encounter: Payer: Self-pay | Admitting: Family Medicine

## 2016-09-05 ENCOUNTER — Telehealth: Payer: Self-pay | Admitting: *Deleted

## 2016-09-05 ENCOUNTER — Ambulatory Visit (INDEPENDENT_AMBULATORY_CARE_PROVIDER_SITE_OTHER): Payer: Medicare PPO | Admitting: Family Medicine

## 2016-09-05 VITALS — BP 102/60 | HR 106 | Temp 98.0°F | Resp 18 | Wt 122.0 lb

## 2016-09-05 DIAGNOSIS — E7800 Pure hypercholesterolemia, unspecified: Secondary | ICD-10-CM

## 2016-09-05 DIAGNOSIS — L249 Irritant contact dermatitis, unspecified cause: Secondary | ICD-10-CM | POA: Diagnosis not present

## 2016-09-05 DIAGNOSIS — E039 Hypothyroidism, unspecified: Secondary | ICD-10-CM

## 2016-09-05 DIAGNOSIS — E038 Other specified hypothyroidism: Secondary | ICD-10-CM

## 2016-09-05 DIAGNOSIS — E559 Vitamin D deficiency, unspecified: Secondary | ICD-10-CM

## 2016-09-05 DIAGNOSIS — I10 Essential (primary) hypertension: Secondary | ICD-10-CM

## 2016-09-05 NOTE — Progress Notes (Signed)
Patient: Tina Salinas Female    DOB: 1946/02/14   71 y.o.   MRN: [id number] Visit Date: 09/05/2016  Today's Provider: Lelon Huh, MD   Chief Complaint  Patient presents with  . Rash   Subjective:    Rash  This is a new problem. The current episode started in the past 7 days. The problem is unchanged. Location: lower back. The rash is characterized by itchiness and redness. She was exposed to nothing. Pertinent negatives include no fatigue, fever, shortness of breath or vomiting. Past treatments include nothing.  Patient has small red bumps on the lower back.      Allergies  Allergen Reactions  . Etodolac     Other reaction(s): Unknown  . Sulfa Antibiotics   . Tetracycline      Current Outpatient Prescriptions:  .  acyclovir (ZOVIRAX) 800 MG tablet, Take 1 tablet (800 mg total) by mouth 3 (three) times daily as needed., Disp: 270 tablet, Rfl: 1 .  alendronate (FOSAMAX) 70 MG tablet, Take by mouth once a week., Disp: , Rfl:  .  ALPRAZolam (XANAX) 0.5 MG tablet, TAKE 1/2 TO 1 TABLET AT BEDTIME, Disp: 30 tablet, Rfl: 1 .  aspirin 81 MG tablet, Take 81 mg by mouth daily., Disp: , Rfl:  .  cholecalciferol (VITAMIN D) 1000 UNITS tablet, Take by mouth daily., Disp: , Rfl:  .  cyclobenzaprine (FLEXERIL) 10 MG tablet, Take 1 tablet (10 mg total) by mouth 3 (three) times daily as needed., Disp: 270 tablet, Rfl: 0 .  fluticasone (FLONASE) 50 MCG/ACT nasal spray, Place 2 sprays into both nostrils daily., Disp: 16 g, Rfl: 1 .  folic acid (FOLVITE) 1 MG tablet, Take by mouth daily., Disp: , Rfl:  .  HYDROcodone-acetaminophen (NORCO) 7.5-325 MG tablet, Take 2 tablets by mouth 3 (three) times daily. 3 months supply., Disp: 540 tablet, Rfl: 0 .  hydroxychloroquine (PLAQUENIL) 200 MG tablet, Take by mouth daily., Disp: , Rfl:  .  ibuprofen (ADVIL,MOTRIN) 800 MG tablet, Take 1 tablet (800 mg total) by mouth 3 (three) times daily., Disp: 270 tablet, Rfl: 1 .  Methotrexate, PF, 20  MG/0.4ML SOAJ, Inject into the skin. Inject 20 mg subcutaneously once a week., Disp: , Rfl:  .  metoprolol tartrate (LOPRESSOR) 25 MG tablet, Take 0.5 tablets (12.5 mg total) by mouth 2 (two) times daily., Disp: 60 tablet, Rfl: 0 .  pravastatin (PRAVACHOL) 40 MG tablet, Take 1 tablet (40 mg total) by mouth at bedtime., Disp: 30 tablet, Rfl: 0 .  promethazine (PHENERGAN) 25 MG tablet, Take 1 tablet (25 mg total) by mouth every 8 (eight) hours as needed for nausea or vomiting., Disp: 120 tablet, Rfl: 0 .  senna-docusate (SENOKOT-S) 8.6-50 MG per tablet, Take 1 tablet by mouth 2 (two) times daily., Disp: 60 tablet, Rfl: 5 .  tolterodine (DETROL LA) 4 MG 24 hr capsule, Take 1 capsule (4 mg total) by mouth daily., Disp: 90 capsule, Rfl: 3 .  valACYclovir (VALTREX) 1000 MG tablet, as needed., Disp: , Rfl:   Review of Systems  Constitutional: Negative for appetite change, chills, fatigue and fever.  Respiratory: Negative for chest tightness and shortness of breath.   Cardiovascular: Negative for chest pain and palpitations.  Gastrointestinal: Negative for abdominal pain, nausea and vomiting.  Skin: Positive for rash.  Neurological: Negative for dizziness and weakness.    Social History  Substance Use Topics  . Smoking status: Never Smoker  . Smokeless tobacco: Never Used  .  Alcohol use No   Objective:   BP 102/60 (BP Location: Left Arm, Patient Position: Sitting, Cuff Size: Normal)   Pulse (!) 106   Temp 98 F (36.7 C) (Oral)   Resp 18   Wt 122 lb (55.3 kg)   SpO2 98% Comment: room air  BMI 23.05 kg/m   Physical Exam  Several small papules across lower back bilaterally.     Assessment & Plan:     1. Irritant contact dermatitis, unspecified trigger Can apply small amounts of OTC hydrocortisone cream.  Call if symptoms change or if not rapidly improving.       The entirety of the information documented in the History of Present Illness, Review of Systems and Physical Exam were  personally obtained by me. Portions of this information were initially documented by Meyer Cory, CMA and reviewed by me for thoroughness and accuracy.    Lelon Huh, MD  [facility]

## 2016-09-05 NOTE — Telephone Encounter (Addendum)
Patient called office requesting a copy of her lab slip. No labs pending in epic. Is patient due for any labs?

## 2016-09-13 ENCOUNTER — Telehealth: Payer: Self-pay | Admitting: Family Medicine

## 2016-09-13 DIAGNOSIS — N393 Stress incontinence (female) (male): Secondary | ICD-10-CM

## 2016-09-13 NOTE — Telephone Encounter (Signed)
Pt needs refill on her incontinence medication.  She said it is Oxybuton 5mg ?  I did not see it listed in her rx's  She [place] now.  Her call back is [phone]  Thank s Con Memos

## 2016-09-13 NOTE — Telephone Encounter (Signed)
Per her list she is on Detrol she use to be on Oxybutynin in 2016-aa

## 2016-09-13 NOTE — Telephone Encounter (Signed)
Did she call the pharmacy. A years worth of refills was sent to [facility] on 04/10/2017

## 2016-09-13 NOTE — Telephone Encounter (Signed)
[id number]-aa 

## 2016-09-14 MED ORDER — OXYBUTYNIN CHLORIDE ER 5 MG PO TB24: 5.0000 mg | ORAL_TABLET | Freq: Every day | ORAL | 4 refills | Status: DC

## 2016-09-14 NOTE — Telephone Encounter (Signed)
Patient states that she spoke with representative at insurance and they will not cover Detrol (brand or generic) and that they will cover Oxybutynin 5 mg at [place] for 90 day supply. Please review.  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 She just wanted to let you know this part-aa

## 2016-09-18 ENCOUNTER — Telehealth: Payer: Self-pay | Admitting: Family Medicine

## 2016-09-18 DIAGNOSIS — R21 Rash and other nonspecific skin eruption: Secondary | ICD-10-CM

## 2016-09-18 DIAGNOSIS — H409 Unspecified glaucoma: Secondary | ICD-10-CM

## 2016-09-18 NOTE — Telephone Encounter (Signed)
Pt called requesting a referral to a dermatologist for a rash and would also like to see an ophthalmologist for glaucoma per pt.Records from previous doctors are on your desk

## 2016-09-18 NOTE — Telephone Encounter (Signed)
Please review. Thanks!  

## 2016-09-19 NOTE — Telephone Encounter (Signed)
OK for referral to dermatology and ophthalmology

## 2016-09-29 ENCOUNTER — Encounter: Payer: Self-pay | Admitting: Family Medicine

## 2016-10-05 ENCOUNTER — Other Ambulatory Visit: Payer: Self-pay | Admitting: *Deleted

## 2016-10-05 DIAGNOSIS — F4322 Adjustment disorder with anxiety: Secondary | ICD-10-CM

## 2016-10-05 DIAGNOSIS — M05749 Rheumatoid arthritis with rheumatoid factor of unspecified hand without organ or systems involvement: Secondary | ICD-10-CM

## 2016-10-05 MED ORDER — ALPRAZOLAM 0.5 MG PO TABS: ORAL_TABLET | ORAL | 1 refills | Status: DC

## 2016-10-05 MED ORDER — IBUPROFEN 800 MG PO TABS: 800.0000 mg | ORAL_TABLET | Freq: Three times a day (TID) | ORAL | 1 refills | Status: DC | PRN

## 2016-10-05 NOTE — Telephone Encounter (Signed)
Please call in alprazolam.  

## 2016-10-05 NOTE — Telephone Encounter (Signed)
Done. Alprazolam called into mail order pharmacy.

## 2016-10-05 NOTE — Telephone Encounter (Signed)
Mail order pharmacy requesting refills. Thanks!  

## 2016-10-06 ENCOUNTER — Ambulatory Visit (INDEPENDENT_AMBULATORY_CARE_PROVIDER_SITE_OTHER): Payer: Medicare PPO | Admitting: Family Medicine

## 2016-10-06 ENCOUNTER — Encounter: Payer: Self-pay | Admitting: Family Medicine

## 2016-10-06 VITALS — BP 110/60 | HR 96 | Temp 98.0°F | Resp 16 | Wt 127.0 lb

## 2016-10-06 DIAGNOSIS — E039 Hypothyroidism, unspecified: Secondary | ICD-10-CM

## 2016-10-06 DIAGNOSIS — M810 Age-related osteoporosis without current pathological fracture: Secondary | ICD-10-CM | POA: Diagnosis not present

## 2016-10-06 DIAGNOSIS — M05749 Rheumatoid arthritis with rheumatoid factor of unspecified hand without organ or systems involvement: Secondary | ICD-10-CM

## 2016-10-06 DIAGNOSIS — I10 Essential (primary) hypertension: Secondary | ICD-10-CM

## 2016-10-06 DIAGNOSIS — E038 Other specified hypothyroidism: Secondary | ICD-10-CM

## 2016-10-06 MED ORDER — HYDROCODONE-ACETAMINOPHEN 7.5-325 MG PO TABS: 2.0000 | ORAL_TABLET | Freq: Three times a day (TID) | ORAL | 0 refills | Status: DC

## 2016-10-06 NOTE — Progress Notes (Signed)
Patient: Tina Salinas Female    DOB: 10-18-1945   71 y.o.   MRN: [id number] Visit Date: 10/06/2016  Today's Provider: Lelon Huh, MD   Chief Complaint  Patient presents with  . Hypertension    follow up  . Hypothyroidism    follow up  . Osteoporosis    follow up   Subjective:    HPI  Hypertension, follow-up:  BP Readings from Last 3 Encounters:  09/05/16 102/60  06/30/16 (!) 88/60  05/19/16 (!) 92/56    She was last seen for hypertension 3 months ago.  BP at that visit was 88/60. Management since that visit includes no changes. She reports good compliance with treatment. She is not having side effects.  She is not exercising. She is adherent to low salt diet.   Outside blood pressures are not being checked. She is experiencing lower extremity edema.  Patient denies chest pain, chest pressure/discomfort, claudication, dyspnea, fatigue, irregular heart beat, near-syncope, orthopnea, palpitations, paroxysmal nocturnal dyspnea, syncope and tachypnea.   Cardiovascular risk factors include advanced age (older than 20 for men, 55 for women), hypertension and sedentary lifestyle.  Use of agents associated with hypertension: NSAIDS.     Weight trend: fluctuating a bit Wt Readings from Last 3 Encounters:  09/05/16 122 lb (55.3 kg)  06/30/16 116 lb (52.6 kg)  05/19/16 114 lb (51.7 kg)    Current diet: in general, an "unhealthy" diet  ------------------------------------------------------------------------ Follow up Hypothyroidism:  Patient was last seen for this problem 3 months ago and no changes were made.    Lab Results  Component Value Date   TSH 4.320 07/16/2015     Follow up of Osteoporosis and Vitamin D deficiency:  Patient was last seen for this problem 3 months ago and no changes were made. Patient has not been taking any Vitamin D supplements. Lab Results  Component Value Date   VD25OH 29.4 (L) 07/16/2015     She had labs ordered in  February, but she has not been to lab yet to get blood drawn. She anticipates going next week.     Allergies  Allergen Reactions  . Etodolac     Other reaction(s): Unknown  . Sulfa Antibiotics   . Tetracycline      Current Outpatient Prescriptions:  .  acyclovir (ZOVIRAX) 800 MG tablet, Take 1 tablet (800 mg total) by mouth 3 (three) times daily as needed., Disp: 270 tablet, Rfl: 1 .  alendronate (FOSAMAX) 70 MG tablet, Take by mouth once a week., Disp: , Rfl:  .  ALPRAZolam (XANAX) 0.5 MG tablet, TAKE 1/2 TO 1 TABLET AT BEDTIME, Disp: 90 tablet, Rfl: 1 .  aspirin 81 MG tablet, Take 81 mg by mouth daily., Disp: , Rfl:  .  cyclobenzaprine (FLEXERIL) 10 MG tablet, Take 1 tablet (10 mg total) by mouth 3 (three) times daily as needed., Disp: 270 tablet, Rfl: 0 .  fluticasone (FLONASE) 50 MCG/ACT nasal spray, Place 2 sprays into both nostrils daily., Disp: 16 g, Rfl: 1 .  folic acid (FOLVITE) 1 MG tablet, Take by mouth daily., Disp: , Rfl:  .  HYDROcodone-acetaminophen (NORCO) 7.5-325 MG tablet, Take 2 tablets by mouth 3 (three) times daily. 3 months supply., Disp: 540 tablet, Rfl: 0 .  hydroxychloroquine (PLAQUENIL) 200 MG tablet, Take by mouth daily., Disp: , Rfl:  .  ibuprofen (ADVIL,MOTRIN) 800 MG tablet, Take 1 tablet (800 mg total) by mouth every 8 (eight) hours as needed for moderate pain.,  Disp: 270 tablet, Rfl: 1 .  Methotrexate, PF, 20 MG/0.4ML SOAJ, Inject into the skin. Inject 20 mg subcutaneously once a week., Disp: , Rfl:  .  metoprolol tartrate (LOPRESSOR) 25 MG tablet, Take 0.5 tablets (12.5 mg total) by mouth 2 (two) times daily., Disp: 60 tablet, Rfl: 0 .  oxybutynin (DITROPAN-XL) 5 MG 24 hr tablet, Take 1 tablet (5 mg total) by mouth at bedtime., Disp: 90 tablet, Rfl: 4 .  pravastatin (PRAVACHOL) 40 MG tablet, Take 1 tablet (40 mg total) by mouth at bedtime., Disp: 30 tablet, Rfl: 0 .  promethazine (PHENERGAN) 25 MG tablet, Take 1 tablet (25 mg total) by mouth every 8 (eight)  hours as needed for nausea or vomiting., Disp: 120 tablet, Rfl: 0 .  senna-docusate (SENOKOT-S) 8.6-50 MG per tablet, Take 1 tablet by mouth 2 (two) times daily., Disp: 60 tablet, Rfl: 5 .  valACYclovir (VALTREX) 1000 MG tablet, as needed., Disp: , Rfl:  .  cholecalciferol (VITAMIN D) 1000 UNITS tablet, Take by mouth daily., Disp: , Rfl:   Review of Systems  Constitutional: Negative for appetite change, chills, fatigue and fever.  Respiratory: Negative for chest tightness and shortness of breath.   Cardiovascular: Negative for chest pain and palpitations.  Gastrointestinal: Negative for abdominal pain, nausea and vomiting.  Musculoskeletal: Positive for arthralgias, back pain and gait problem.  Neurological: Negative for dizziness and weakness.  Hematological: Bruises/bleeds easily.    Social History  Substance Use Topics  . Smoking status: Never Smoker  . Smokeless tobacco: Never Used  . Alcohol use No   Objective:   BP 110/60 (BP Location: Right Arm, Patient Position: Sitting, Cuff Size: Normal)   Pulse 96   Temp 98 F (36.7 C) (Oral)   Resp 16   Wt 127 lb (57.6 kg)   SpO2 97% Comment: room air  BMI 24.00 kg/m  There were no vitals filed for this visit.   Physical Exam   General Appearance:    Alert, cooperative, no distress  Eyes:    PERRL, conjunctiva/corneas clear, EOM's intact       Lungs:     Clear to auscultation bilaterally, respirations unlabored  Heart:    Regular rate and rhythm  Neurologic:   Awake, alert, oriented x 3. No apparent focal neurological           defect.           Assessment & Plan:     1. Essential (primary) hypertension Well controlled.  Continue current medications.    2. 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  - HYDROcodone-acetaminophen (NORCO) 7.5-325 MG tablet; Take 2 tablets by mouth 3 (three) times  daily. 3 months supply.  Dispense: 540 tablet; Refill: 0  3. Osteoporosis, unspecified osteoporosis type, unspecified pathological fracture presence Continue Fosamax.   4. Subclinical hypothyroidism She still has lab order from February and plans on taking it to [place] in the next several day.        Lelon Huh, MD  [facility]

## 2016-10-24 ENCOUNTER — Telehealth: Payer: Self-pay | Admitting: Family Medicine

## 2016-10-24 NOTE — Telephone Encounter (Signed)
Pt and [employer] called wanting to get PA for her    oxybutynin (DITROPAN-XL) 5 MG 24 hr tablet  ALPRAZolam (XANAX) 0.5 MG tablet  Taking 10/05/16 -- Birdie Sons, MD    TAKE 1/2 TO 1 TABLET AT BEDTIME     You can either fax the PA to [phone]  Or call [phone]

## 2016-10-24 NOTE — Telephone Encounter (Signed)
Please advise 

## 2016-10-26 NOTE — Telephone Encounter (Signed)
Have filled out prior auth already, but insurance returned them for more information. Filled out more forms today.

## 2016-10-26 NOTE — Telephone Encounter (Signed)
Pt called today to see if this has been completed.  CB#[phone]/MW

## 2016-10-27 ENCOUNTER — Other Ambulatory Visit: Payer: Self-pay | Admitting: Family Medicine

## 2016-10-27 MED ORDER — OXYBUTYNIN CHLORIDE 5 MG PO TABS: 5.0000 mg | ORAL_TABLET | Freq: Two times a day (BID) | ORAL | 1 refills | Status: DC | PRN

## 2016-11-02 ENCOUNTER — Other Ambulatory Visit: Payer: Self-pay | Admitting: Family Medicine

## 2016-11-02 NOTE — Telephone Encounter (Signed)
Please review

## 2016-11-02 NOTE — Telephone Encounter (Signed)
Per pharmacist at [facility], medication oxybutynin 5 mg bid is approved through [place] for 90 day supply with $0 out of pocket for pt.

## 2016-11-02 NOTE — Telephone Encounter (Signed)
Patient and [place] with [employer] calling to request a appeal on the following medication that the patient is in need of. [place] states she will not be in the office after today until [facility] she left the phone # and fax # to appeal the medication. If you have and questions to please call patient also. Patient states she really needs this medication, that it really helps her. Thanks CC  To appeal a medication   Phone # [phone] Fax #  [phone]     oxybutynin (DITROPAN) 5 MG tablet

## 2016-11-02 NOTE — Telephone Encounter (Signed)
I already did an appeal and was denied. Please call and find out which urinary incontinence medication they do cover. They must cover either oxybutynin, tolterodine, or solifenacin. I don't care which one she takes, but she needs something.

## 2016-11-03 NOTE — Telephone Encounter (Signed)
LMOVM for pt to return call 

## 2016-11-03 NOTE — Telephone Encounter (Signed)
That's what was prescribed and sent to [facility] on 10-27-2016. I don't understand what the problem is.

## 2016-11-06 NOTE — Telephone Encounter (Signed)
Tina Salinas spoke to patient concerning medication PA. Patient is fine with taking oxybutyin 5 mg bid.

## 2016-11-09 ENCOUNTER — Other Ambulatory Visit: Payer: Self-pay | Admitting: Family Medicine

## 2016-11-09 MED ORDER — ACYCLOVIR 800 MG PO TABS: 800.0000 mg | ORAL_TABLET | Freq: Three times a day (TID) | ORAL | 3 refills | Status: DC | PRN

## 2016-11-09 NOTE — Telephone Encounter (Signed)
[facility] faxed a request for the following medication.  Thanks CC   acyclovir (ZOVIRAX) 800 MG tablet  Take 1 Tablet three times daily as needed.

## 2017-01-05 ENCOUNTER — Ambulatory Visit: Payer: Self-pay | Admitting: Family Medicine

## 2017-01-08 ENCOUNTER — Ambulatory Visit: Payer: Self-pay | Admitting: Family Medicine

## 2017-01-15 ENCOUNTER — Ambulatory Visit: Payer: Self-pay | Admitting: Family Medicine

## 2017-01-15 ENCOUNTER — Telehealth: Payer: Self-pay | Admitting: Family Medicine

## 2017-01-15 DIAGNOSIS — I10 Essential (primary) hypertension: Secondary | ICD-10-CM

## 2017-01-15 DIAGNOSIS — E7800 Pure hypercholesterolemia, unspecified: Secondary | ICD-10-CM

## 2017-01-15 DIAGNOSIS — Z86010 Personal history of colonic polyps: Secondary | ICD-10-CM

## 2017-01-15 DIAGNOSIS — E038 Other specified hypothyroidism: Secondary | ICD-10-CM

## 2017-01-15 DIAGNOSIS — E559 Vitamin D deficiency, unspecified: Secondary | ICD-10-CM

## 2017-01-15 DIAGNOSIS — E039 Hypothyroidism, unspecified: Secondary | ICD-10-CM

## 2017-01-15 NOTE — Telephone Encounter (Signed)
Please reprint labs ordered 09/05/2016. Order for colonoscopy has been sent to [facility]

## 2017-01-15 NOTE — Telephone Encounter (Signed)
Pt is requesting another lab slip to pick up on Friday.  Pt is requesting a referral to have a colonoscopy with [facility].  CB#[phone]/MW

## 2017-01-15 NOTE — Telephone Encounter (Signed)
Please review-aa 

## 2017-01-15 NOTE — Telephone Encounter (Signed)
Labs re ordered and printed, LMTCB-aa

## 2017-01-22 ENCOUNTER — Ambulatory Visit: Payer: Medicare PPO | Admitting: Family Medicine

## 2017-01-29 ENCOUNTER — Encounter: Payer: Self-pay | Admitting: Family Medicine

## 2017-01-29 ENCOUNTER — Ambulatory Visit (INDEPENDENT_AMBULATORY_CARE_PROVIDER_SITE_OTHER): Payer: Medicare PPO | Admitting: Family Medicine

## 2017-01-29 VITALS — BP 102/62 | HR 72 | Temp 98.1°F | Resp 16 | Wt 117.0 lb

## 2017-01-29 DIAGNOSIS — T148XXA Other injury of unspecified body region, initial encounter: Secondary | ICD-10-CM

## 2017-01-29 DIAGNOSIS — E038 Other specified hypothyroidism: Secondary | ICD-10-CM

## 2017-01-29 DIAGNOSIS — S61439A Puncture wound without foreign body of unspecified hand, initial encounter: Secondary | ICD-10-CM | POA: Diagnosis not present

## 2017-01-29 DIAGNOSIS — I2510 Atherosclerotic heart disease of native coronary artery without angina pectoris: Secondary | ICD-10-CM | POA: Diagnosis not present

## 2017-01-29 DIAGNOSIS — M05749 Rheumatoid arthritis with rheumatoid factor of unspecified hand without organ or systems involvement: Secondary | ICD-10-CM | POA: Diagnosis not present

## 2017-01-29 DIAGNOSIS — E039 Hypothyroidism, unspecified: Secondary | ICD-10-CM | POA: Diagnosis not present

## 2017-01-29 DIAGNOSIS — Z23 Encounter for immunization: Secondary | ICD-10-CM

## 2017-01-29 DIAGNOSIS — I10 Essential (primary) hypertension: Secondary | ICD-10-CM

## 2017-01-29 MED ORDER — HYDROCODONE-ACETAMINOPHEN 7.5-325 MG PO TABS: 2.0000 | ORAL_TABLET | Freq: Three times a day (TID) | ORAL | 0 refills | Status: DC

## 2017-01-29 NOTE — Progress Notes (Signed)
Patient: Tina Salinas Female    DOB: [birth year]   71 y.o.   MRN: [id number] Visit Date: 01/29/2017  Today's Provider: Lelon Huh, MD   Chief Complaint  Patient presents with  . Hypertension  . Hypothyroidism  . Rheumatoid Arthritis   Subjective:    HPI  Hypertension, follow-up:  BP Readings from Last 3 Encounters:  01/29/17 102/62  10/06/16 110/60  09/05/16 102/60    She was last seen for hypertension 4 months ago.  BP at that visit was 110/60. Management since that visit includes no changes. She reports good compliance with treatment. She is not having side effects.  She is not exercising. She is adherent to low salt diet.   Outside blood pressures are not being checked. She is experiencing none.  Patient denies chest pressure/discomfort, lower extremity edema and palpitations.        Weight trend: stable Wt Readings from Last 3 Encounters:  01/29/17 117 lb (53.1 kg)  10/06/16 127 lb (57.6 kg)  09/05/16 122 lb (55.3 kg)    Current diet: well balanced   Osteoporosis, follow up: Patient was seen in the office 4 months ago. No changes were made in her medications. She is currently taking fosamax once weekly, and reports good symptom control. She also takes OTC Vitamin D.   Rheumatoid arthritis, follow up: Patient was last seen 4 months ago. No changes were made in her medications. She reports good symptom control.  She also reports she was bit by a small dog on her hand a few days ago and would like me take a look at wound.     Allergies  Allergen Reactions  . Etodolac     Other reaction(s): Unknown  . Sulfa Antibiotics   . Tetracycline      Current Outpatient Prescriptions:  .  acyclovir (ZOVIRAX) 800 MG tablet, Take 1 tablet (800 mg total) by mouth 3 (three) times daily as needed., Disp: 270 tablet, Rfl: 3 .  alendronate (FOSAMAX) 70 MG tablet, Take by mouth once a week., Disp: , Rfl:  .  ALPRAZolam (XANAX) 0.5 MG tablet, TAKE 1/2 TO 1  TABLET AT BEDTIME, Disp: 90 tablet, Rfl: 1 .  aspirin 81 MG tablet, Take 81 mg by mouth daily., Disp: , Rfl:  .  cholecalciferol (VITAMIN D) 1000 UNITS tablet, Take by mouth daily., Disp: , Rfl:  .  cyclobenzaprine (FLEXERIL) 10 MG tablet, Take 1 tablet (10 mg total) by mouth 3 (three) times daily as needed., Disp: 270 tablet, Rfl: 0 .  fluticasone (FLONASE) 50 MCG/ACT nasal spray, Place 2 sprays into both nostrils daily., Disp: 16 g, Rfl: 1 .  folic acid (FOLVITE) 1 MG tablet, Take by mouth daily., Disp: , Rfl:  .  HYDROcodone-acetaminophen (NORCO) 7.5-325 MG tablet, Take 2 tablets by mouth 3 (three) times daily. 3 months supply., Disp: 540 tablet, Rfl: 0 .  hydroxychloroquine (PLAQUENIL) 200 MG tablet, Take by mouth daily., Disp: , Rfl:  .  ibuprofen (ADVIL,MOTRIN) 800 MG tablet, Take 1 tablet (800 mg total) by mouth every 8 (eight) hours as needed for moderate pain., Disp: 270 tablet, Rfl: 1 .  Methotrexate, PF, 20 MG/0.4ML SOAJ, Inject into the skin. Inject 20 mg subcutaneously once a week., Disp: , Rfl:  .  metoprolol tartrate (LOPRESSOR) 25 MG tablet, Take 0.5 tablets (12.5 mg total) by mouth 2 (two) times daily., Disp: 60 tablet, Rfl: 0 .  oxybutynin (DITROPAN) 5 MG tablet, Take 1 tablet (5 mg  total) by mouth 2 (two) times daily as needed for bladder spasms., Disp: 180 tablet, Rfl: 1 .  pravastatin (PRAVACHOL) 40 MG tablet, Take 1 tablet (40 mg total) by mouth at bedtime., Disp: 30 tablet, Rfl: 0 .  promethazine (PHENERGAN) 25 MG tablet, Take 1 tablet (25 mg total) by mouth every 8 (eight) hours as needed for nausea or vomiting., Disp: 120 tablet, Rfl: 0 .  senna-docusate (SENOKOT-S) 8.6-50 MG per tablet, Take 1 tablet by mouth 2 (two) times daily., Disp: 60 tablet, Rfl: 5 .  valACYclovir (VALTREX) 1000 MG tablet, as needed., Disp: , Rfl:   Review of Systems  Constitutional: Negative.   Respiratory: Negative.   Cardiovascular: Negative.   Musculoskeletal: Negative.   Neurological:  Negative.     Social History  Substance Use Topics  . Smoking status: Never Smoker  . Smokeless tobacco: Never Used  . Alcohol use No   Objective:   BP 102/62 (BP Location: Right Arm, Patient Position: Sitting, Cuff Size: Normal)   Pulse 72   Temp 98.1 F (36.7 C)   Resp 16   Wt 117 lb (53.1 kg)   BMI 22.11 kg/m     Physical Exam   General Appearance:    Alert, cooperative, no distress  Eyes:    PERRL, conjunctiva/corneas clear, EOM's intact       Lungs:     Clear to auscultation bilaterally, respirations unlabored  Heart:    Regular rate and rhythm  Neurologic:   Awake, alert, oriented x 3. No apparent focal neurological           defect.   Skin:   Small puncture and abraded skin around bit mark on dorsum of hand, no drainage, no erythema.        Assessment & Plan:     1. Subclinical hypothyroidism Check TSH.   2. Essential (primary) hypertension Well controlled.  Continue current medications.  Check renal   3. Arteriosclerosis of coronary artery Check lipid panel, currently on pravastatin.   4. 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 3 months supply.  Dispense: 540 tablet; Refill: 0  5. Bite by animal No sign of infection.  - Td : Tetanus/diphtheria >7yo Preservative  free     The entirety of the information documented in the History of Present Illness, Review of Systems and Physical Exam were personally obtained by me. Portions of this information were initially documented by Wilburt Finlay, CMA and reviewed by me for thoroughness and accuracy.    Lelon Huh, MD  [facility]

## 2017-03-15 ENCOUNTER — Other Ambulatory Visit: Payer: Self-pay | Admitting: Family Medicine

## 2017-03-15 DIAGNOSIS — F4322 Adjustment disorder with anxiety: Secondary | ICD-10-CM

## 2017-03-15 NOTE — Telephone Encounter (Signed)
Please call in alprazolam.  

## 2017-03-16 NOTE — Telephone Encounter (Signed)
Rx called in to pharmacy. 

## 2017-05-02 ENCOUNTER — Ambulatory Visit: Payer: Self-pay | Admitting: Family Medicine

## 2017-05-09 ENCOUNTER — Other Ambulatory Visit: Payer: Self-pay | Admitting: Cardiology

## 2017-05-15 ENCOUNTER — Other Ambulatory Visit: Payer: Self-pay | Admitting: Cardiology

## 2017-05-16 ENCOUNTER — Encounter: Payer: Self-pay | Admitting: *Deleted

## 2017-05-16 ENCOUNTER — Ambulatory Visit
Admission: RE | Admit: 2017-05-16 | Discharge: 2017-05-16 | Disposition: A | Discharge status: Home / Self Care | Payer: Medicare PPO | Source: Ambulatory Visit | Attending: Cardiology | Admitting: Cardiology

## 2017-05-16 ENCOUNTER — Encounter: Admission: RE | Payer: Self-pay | Source: Ambulatory Visit

## 2017-05-16 ENCOUNTER — Ambulatory Visit: Admission: RE | Admit: 2017-05-16 | Payer: Medicare PPO | Source: Ambulatory Visit | Admitting: Cardiology

## 2017-05-16 ENCOUNTER — Encounter
Admission: RE | Disposition: A | Discharge status: Home / Self Care | Payer: Self-pay | Source: Ambulatory Visit | Attending: Cardiology

## 2017-05-16 DIAGNOSIS — R079 Chest pain, unspecified: Secondary | ICD-10-CM | POA: Diagnosis present

## 2017-05-16 DIAGNOSIS — I2510 Atherosclerotic heart disease of native coronary artery without angina pectoris: Secondary | ICD-10-CM | POA: Insufficient documentation

## 2017-05-16 DIAGNOSIS — I340 Nonrheumatic mitral (valve) insufficiency: Secondary | ICD-10-CM | POA: Diagnosis not present

## 2017-05-16 DIAGNOSIS — M1990 Unspecified osteoarthritis, unspecified site: Secondary | ICD-10-CM | POA: Diagnosis not present

## 2017-05-16 DIAGNOSIS — M069 Rheumatoid arthritis, unspecified: Secondary | ICD-10-CM | POA: Insufficient documentation

## 2017-05-16 DIAGNOSIS — I10 Essential (primary) hypertension: Secondary | ICD-10-CM | POA: Insufficient documentation

## 2017-05-16 DIAGNOSIS — Z881 Allergy status to other antibiotic agents status: Secondary | ICD-10-CM | POA: Insufficient documentation

## 2017-05-16 DIAGNOSIS — G809 Cerebral palsy, unspecified: Secondary | ICD-10-CM | POA: Insufficient documentation

## 2017-05-16 DIAGNOSIS — Z794 Long term (current) use of insulin: Secondary | ICD-10-CM | POA: Diagnosis not present

## 2017-05-16 DIAGNOSIS — Z7982 Long term (current) use of aspirin: Secondary | ICD-10-CM | POA: Diagnosis not present

## 2017-05-16 DIAGNOSIS — Z79899 Other long term (current) drug therapy: Secondary | ICD-10-CM | POA: Insufficient documentation

## 2017-05-16 DIAGNOSIS — I890 Lymphedema, not elsewhere classified: Secondary | ICD-10-CM | POA: Diagnosis not present

## 2017-05-16 HISTORY — PX: LEFT HEART CATH AND CORONARY ANGIOGRAPHY: CATH118249

## 2017-05-16 SURGERY — LEFT HEART CATH AND CORONARY ANGIOGRAPHY
Anesthesia: Moderate Sedation | Laterality: Left

## 2017-05-16 SURGERY — LEFT HEART CATH AND CORONARY ANGIOGRAPHY
Anesthesia: Moderate Sedation

## 2017-05-16 MED ORDER — ASPIRIN 81 MG PO CHEW
81.0000 mg | CHEWABLE_TABLET | ORAL | Status: AC
  Administered 2017-05-16: 81 mg via ORAL

## 2017-05-16 MED ORDER — ASPIRIN 81 MG PO CHEW
CHEWABLE_TABLET | ORAL | Status: AC
  Filled 2017-05-16: qty 1

## 2017-05-16 MED ORDER — LIDOCAINE HCL (PF) 1 % IJ SOLN
INTRAMUSCULAR | Status: AC
  Filled 2017-05-16: qty 30

## 2017-05-16 MED ORDER — SODIUM CHLORIDE 0.9 % IV SOLN: INTRAVENOUS | Status: DC

## 2017-05-16 MED ORDER — ASPIRIN 81 MG PO CHEW: 81.0000 mg | CHEWABLE_TABLET | ORAL | Status: DC

## 2017-05-16 MED ORDER — MIDAZOLAM HCL 2 MG/2ML IJ SOLN
INTRAMUSCULAR | Status: AC
  Filled 2017-05-16: qty 2

## 2017-05-16 MED ORDER — SODIUM CHLORIDE 0.9% FLUSH: 3.0000 mL | Freq: Two times a day (BID) | INTRAVENOUS | Status: DC

## 2017-05-16 MED ORDER — SODIUM CHLORIDE 0.9% FLUSH: 3.0000 mL | INTRAVENOUS | Status: DC | PRN

## 2017-05-16 MED ORDER — SODIUM CHLORIDE 0.9 % IV SOLN
INTRAVENOUS | Status: DC
  Administered 2017-05-16: 08:00:00 via INTRAVENOUS

## 2017-05-16 MED ORDER — HEPARIN (PORCINE) IN NACL 2-0.9 UNIT/ML-% IJ SOLN
INTRAMUSCULAR | Status: AC
  Filled 2017-05-16: qty 500

## 2017-05-16 MED ORDER — IOPAMIDOL (ISOVUE-300) INJECTION 61%
INTRAVENOUS | Status: DC | PRN
  Administered 2017-05-16: 100 mL via INTRA_ARTERIAL

## 2017-05-16 MED ORDER — MIDAZOLAM HCL 2 MG/2ML IJ SOLN
INTRAMUSCULAR | Status: DC | PRN
  Administered 2017-05-16: 1 mg via INTRAVENOUS

## 2017-05-16 MED ORDER — SODIUM CHLORIDE 0.9 % IV SOLN: 250.0000 mL | INTRAVENOUS | Status: DC | PRN

## 2017-05-16 MED ORDER — LIDOCAINE HCL (PF) 1 % IJ SOLN
INTRAMUSCULAR | Status: DC | PRN
  Administered 2017-05-16: 18 mL via INTRADERMAL

## 2017-05-16 SURGICAL SUPPLY — 9 items
CATH INFINITI 5FR ANG PIGTAIL (CATHETERS) ×3 IMPLANT
CATH INFINITI 5FR JL4 (CATHETERS) ×3 IMPLANT
CATH INFINITI JR4 5F (CATHETERS) ×3 IMPLANT
DEVICE CLOSURE MYNXGRIP 5F (Vascular Products) ×3 IMPLANT
KIT MANI 3VAL PERCEP (MISCELLANEOUS) ×3 IMPLANT
NEEDLE PERC 18GX7CM (NEEDLE) ×3 IMPLANT
PACK CARDIAC CATH (CUSTOM PROCEDURE TRAY) ×3 IMPLANT
SHEATH AVANTI 5FR X 11CM (SHEATH) ×3 IMPLANT
WIRE EMERALD 3MM-J .035X150CM (WIRE) ×3 IMPLANT

## 2017-05-16 NOTE — H&P (Signed)
Chief Complaint: Chief Complaint  Patient presents with  . Follow-up  had an Echo and Lexiscan  . Chest Pain  under some stress sat and did have some chest pain  Date of Service: 05/07/2017 Date of Birth: [birth year] PCP: Birdie Sons, MD  History of Present Illness: Ms. Fehrenbach is a 71 y.o.female patient who returns for follow-up visit. Patient recently began noting more chest pain. This can occur with rest but predominantly with exertion. She has some associated shortness of breath. She is limited in her activity due to her cerebral palsy however. She had an EF 33% in the past. She underwent an echocardiogram showed ejection fraction of 20-25% with a functional study showing similar ejection fraction and evidence of possible inferolateral scar with peri-infarct ischemia. She has ongoing exertional chest pain. Will need to proceed with left heart cath to evaluate coronary anatomy given her high risk functional study. Past Medical and Surgical History  Past Medical History Past Medical History:  Diagnosis Date  . Adenoma  . Cerebral palsy (CMS-HCC)  . Coronary artery disease  . Femur fracture (CMS-HCC)  . History of blood transfusion 1994  . Hypertension  . Osteoarthritis  . Osteoporosis, post-menopausal  Femur fracture. Alendronate  . Rheumatoid arthritis (CMS-HCC)  Seropositive. Positive any CCP antibodies some. Methotrexate. Plaquenil.   Past Surgical History She has a past surgical history that includes Joint replacement; Fusion Arthrodesis Ankle W/Arthroscopy; Knee arthroscopy; and Colonoscopy (06/02/2013).   Medications and Allergies  Current Medications  Current Outpatient Medications  Medication Sig Dispense Refill  . acyclovir (ZOVIRAX) 800 MG tablet Take by mouth.  Marland Kitchen alendronate (FOSAMAX) 70 MG tablet Take 1 tablet (70 mg total) by mouth every 7 (seven) days. Take with a full glass of water. Do not lie down for the next 30 min. 12 tablet 3  . ALPRAZolam (XANAX) 0.5  MG tablet  . aspirin 81 MG EC tablet 1 tab by mouth daily  . benzonatate (TESSALON) 100 MG capsule Take 1-2 every four hours as needed for cough.  . calcipotriene (DOVONEX) 0.005 % cream Apply topically 2 (two) times daily. To chest and back 60 g 5  . calcium carbonate-vitamin D3 500mg  (1,250mg ) -600 unit Tab  . cyclobenzaprine (FLEXERIL) 10 MG tablet  . folic acid (FOLVITE) 1 MG tablet TAKE TWO TABLETS EVERY DAY 360 tablet 0  . HYDROcodone-acetaminophen (NORCO) 7.5-325 mg tablet  . hydroxychloroquine (PLAQUENIL) 200 mg tablet Take 2 tablets (400 mg total) by mouth once daily. 180 tablet 1  . ibuprofen (ADVIL,MOTRIN) 800 MG tablet  . insulin syringe-needle U-100 (INSULIN SYRINGE MICROFINE) 1 mL 27 gauge x 5/8" Syrg 1 syringe to use once a week to administer methotrexate. 12 Syringe 1  . metoprolol tartrate (LOPRESSOR) 25 MG tablet Take 1 tablet (25 mg total) by mouth daily after dinner. 90 tablet 3  . oxybutynin (DITROPAN) 5 MG tablet  . pravastatin (PRAVACHOL) 40 MG tablet Take 1 tablet (40 mg total) by mouth nightly. 90 tablet 3  . predniSONE (DELTASONE) 5 MG tablet TAKE 1 TABLET ONE TIME DAILY 90 tablet 1  . promethazine (PHENERGAN) 25 MG tablet Take by mouth.  . triamcinolone 0.1 % cream Apply topically 2 (two) times daily.  Marland Kitchen alcohol swabs PadM Apply 2 each topically once a week. 40 each 1  . methotrexate 25 mg/mL injection 0.57mL subcue once a week. 10 mL 1   No current facility-administered medications for this visit.   Allergies: Etodolac; Tetracycline; and Sulfa (sulfonamide antibiotics)  Social  and Family History  Social History reports that she has never smoked. She has never used smokeless tobacco. She reports that she does not drink alcohol or use drugs.  Family History Family History  Problem Relation Age of Onset  . Alcohol abuse Father  . Raynaud syndrome Sister   Review of Systems  Review of Systems  Constitutional: Negative for chills, diaphoresis, fever,  malaise/fatigue and weight loss.  HENT: Negative for congestion, ear discharge, hearing loss and tinnitus.  Eyes: Negative for blurred vision.  Respiratory: Positive for shortness of breath. Negative for cough, hemoptysis, sputum production and wheezing.  Cardiovascular: Positive for chest pain. Negative for palpitations, orthopnea, claudication, leg swelling and PND.  Gastrointestinal: Negative for abdominal pain, blood in stool, constipation, diarrhea, heartburn, melena, nausea and vomiting.  Genitourinary: Negative for dysuria, frequency, hematuria and urgency.  Musculoskeletal: Positive for joint pain and myalgias. Negative for back pain and falls.  Skin: Negative for itching and rash.  Neurological: Negative for dizziness, tingling, focal weakness, loss of consciousness, weakness and headaches.  Endo/Heme/Allergies: Negative for polydipsia. Does not bruise/bleed easily.  Psychiatric/Behavioral: Negative for depression, memory loss and substance abuse. The patient is not nervous/anxious.   Physical Examination   Vitals:BP 100/60  Pulse 64  Resp 12  Ht 154.9 cm (5\' 1" )  Wt 51.7 kg (114 lb)  BMI 21.54 kg/m  Ht:154.9 cm (5\' 1" ) Wt:51.7 kg (114 lb) SWF:UXNA surface area is 1.49 meters squared. Body mass index is 21.54 kg/m.  Wt Readings from Last 3 Encounters:  05/07/17 51.7 kg (114 lb)  04/12/17 51.7 kg (114 lb)  04/02/17 52.2 kg (115 lb)   BP Readings from Last 3 Encounters:  05/07/17 100/60  04/12/17 100/64  04/02/17 128/64   General appearance appears in no acute distress  Head Mouth and Eye exam Normocephalic, without obvious abnormality, atraumatic Dentition is good Eyes appear anicteric   Neck exam Thyroid: normal  Nodes: no obvious adenopathy  LUNGS Breath Sounds: Normal Percussion: Normal  CARDIOVASCULAR JVP CV wave: no HJR: no Elevation at 90 degrees: None Carotid Pulse: normal pulsation bilaterally Bruit: None Apex: apical impulse  normal  Auscultation Rhythm: normal sinus rhythm S1: normal S2: normal Clicks: no Rub: no Murmurs: no murmurs  Gallop: None ABDOMEN Liver enlargement: no Pulsatile aorta: no Ascites: no Bruits: no  EXTREMITIES Clubbing: no Edema: trace to 1+ bilateral pedal edema Pulses: peripheral pulses symmetrical Femoral Bruits: no Amputation: no SKIN Rash: no Cyanosis: no Embolic phemonenon: no Bruising: no NEURO Alert and Oriented to person, place and time: yes Non focal: yes  PSYCH: Pt appears to have normal affect  LABS REVIEWED Last 3 CBC results: Lab Results  Component Value Date  WBC 6.3 05/07/2017  WBC 6.3 03/26/2017  WBC 6.6 12/26/2016   Lab Results  Component Value Date  HGB 12.6 05/07/2017  HGB 12.2 03/26/2017  HGB 12.9 12/26/2016   Lab Results  Component Value Date  HCT 39.5 05/07/2017  HCT 37.7 03/26/2017  HCT 40.1 12/26/2016   Lab Results  Component Value Date  PLT 212 05/07/2017  PLT 216 03/26/2017  PLT 228 12/26/2016   Lab Results  Component Value Date  CREATININE 0.7 03/26/2017  BUN 6 (L) 01/30/2008  NA 140 01/30/2008  K 3.4 (L) 01/30/2008  CL 103 01/30/2008  CO2 31 (H) 01/30/2008   No results found for: HGBA1C  Lab Results  Component Value Date  HDL 73.9 09/19/2016   Lab Results  Component Value Date  LDLCALC 71 09/19/2016   Lab  Results  Component Value Date  TRIG 69 09/19/2016   Lab Results  Component Value Date  ALT 7 03/26/2017  AST 19 03/26/2017  ALKPHOS 73 09/19/2016   Lab Results  Component Value Date  TSH 1.50 04/21/2009   Diagnostic Studies Reviewed:  EKG EKG demonstrated normal sinus rhythm, nonspecific ST and T waves changes.  Assessment and Plan   71 y.o. female with  ICD-10-CM ICD-9-CM  1. Essential hypertension-doing well with current regimen. Will continue with her current regimen including metoprolol 25 mg twice daily. DASH diet is recommended. I10 401.9   2. Coronary artery disease of native  artery of native heart with stable angina pectoris (CMS-HCC)-etiology of chest pain is unclear. Given reduced LV function as well as evidence of inferolateral scar with peri-infarct ischemia will need to proceed with left heart cath to evaluate coronary anatomy to guide further therapy. I25.118 414.01  413.9  3. Lymphedema-currently stable. I89.0 457.1   Return in about 4 weeks (around 06/04/2017).  These notes generated with voice recognition software. I apologize for typographical errors.  Sydnee Levans, MD     Pt seen and examined. No change from above.

## 2017-05-18 ENCOUNTER — Ambulatory Visit (INDEPENDENT_AMBULATORY_CARE_PROVIDER_SITE_OTHER): Payer: Medicare PPO

## 2017-05-18 ENCOUNTER — Ambulatory Visit (INDEPENDENT_AMBULATORY_CARE_PROVIDER_SITE_OTHER): Payer: Medicare PPO | Admitting: Family Medicine

## 2017-05-18 VITALS — BP 88/52 | HR 80 | Temp 98.3°F | Ht 61.0 in | Wt 116.6 lb

## 2017-05-18 DIAGNOSIS — M05749 Rheumatoid arthritis with rheumatoid factor of unspecified hand without organ or systems involvement: Secondary | ICD-10-CM

## 2017-05-18 DIAGNOSIS — E038 Other specified hypothyroidism: Secondary | ICD-10-CM

## 2017-05-18 DIAGNOSIS — Z0000 Encounter for general adult medical examination without abnormal findings: Secondary | ICD-10-CM | POA: Diagnosis not present

## 2017-05-18 DIAGNOSIS — Z23 Encounter for immunization: Secondary | ICD-10-CM | POA: Diagnosis not present

## 2017-05-18 DIAGNOSIS — Z1159 Encounter for screening for other viral diseases: Secondary | ICD-10-CM | POA: Diagnosis not present

## 2017-05-18 DIAGNOSIS — E039 Hypothyroidism, unspecified: Secondary | ICD-10-CM | POA: Diagnosis not present

## 2017-05-18 DIAGNOSIS — M810 Age-related osteoporosis without current pathological fracture: Secondary | ICD-10-CM | POA: Diagnosis not present

## 2017-05-18 DIAGNOSIS — E559 Vitamin D deficiency, unspecified: Secondary | ICD-10-CM

## 2017-05-18 DIAGNOSIS — I2510 Atherosclerotic heart disease of native coronary artery without angina pectoris: Secondary | ICD-10-CM

## 2017-05-18 DIAGNOSIS — E7800 Pure hypercholesterolemia, unspecified: Secondary | ICD-10-CM

## 2017-05-18 MED ORDER — HYDROCODONE-ACETAMINOPHEN 7.5-325 MG PO TABS: 2.0000 | ORAL_TABLET | Freq: Three times a day (TID) | ORAL | 0 refills | Status: DC

## 2017-05-18 NOTE — Progress Notes (Signed)
Patient: Tina Salinas Female    DOB: [birth year]   71 y.o.   MRN: [id number] Visit Date: 05/18/2017  Today's Provider: Lelon Huh, MD   Chief Complaint  Patient presents with  . Hypertension    follow up  . Hypothyroidism    follow up  . Rheumatoid Arthritis    follow up   Subjective:    HPI  Hypertension, follow-up:  BP Readings from Last 3 Encounters:  05/18/17 (!) 88/52  05/16/17 118/64  01/29/17 102/62   Weight trend: stable Wt Readings from Last 3 Encounters:  05/18/17 116 lb 9.6 oz (52.9 kg)  05/16/17 115 lb (52.2 kg)  01/29/17 117 lb (53.1 kg)    Current diet: not asked  ------------------------------------------------------------------------ Follow up of Hypothyroidism:  Patient was last seen for this problem 4 months ago and no changes were made. Patient reports good compliance with treatment, and good tolerance.  Lab Results  Component Value Date   TSH 4.320 07/16/2015     Follow up of Rheumatoid arthritis:  Patient was last seen for this problem 3 months ago and no changes were made. Patient reports good compliance with treatment, good tolerance and good symptom control.     Allergies  Allergen Reactions  . Etodolac     Other reaction(s): Unknown  . Sulfa Antibiotics   . Tetracycline      Current Outpatient Prescriptions:  .  acyclovir (ZOVIRAX) 800 MG tablet, Take 1 tablet (800 mg total) by mouth 3 (three) times daily as needed., Disp: 270 tablet, Rfl: 3 .  alendronate (FOSAMAX) 70 MG tablet, Take by mouth once a week., Disp: , Rfl:  .  ALPRAZolam (XANAX) 0.5 MG tablet, TAKE 1/2 TO 1 TABLET AT BEDTIME (Patient not taking: Reported on 05/16/2017), Disp: 90 tablet, Rfl: 1 .  aspirin 81 MG tablet, Take 81 mg by mouth daily., Disp: , Rfl:  .  cholecalciferol (VITAMIN D) 1000 UNITS tablet, Take by mouth daily., Disp: , Rfl:  .  cyclobenzaprine (FLEXERIL) 10 MG tablet, Take 1 tablet (10 mg total) by mouth 3 (three) times daily as needed.  (Patient not taking: Reported on 05/16/2017), Disp: 270 tablet, Rfl: 0 .  fluticasone (FLONASE) 50 MCG/ACT nasal spray, Place 2 sprays into both nostrils daily. (Patient not taking: Reported on 05/16/2017), Disp: 16 g, Rfl: 1 .  folic acid (FOLVITE) 1 MG tablet, Take by mouth daily., Disp: , Rfl:  .  HYDROcodone-acetaminophen (NORCO) 7.5-325 MG tablet, Take 2 tablets by mouth 3 (three) times daily. 3 months supply., Disp: 540 tablet, Rfl: 0 .  hydroxychloroquine (PLAQUENIL) 200 MG tablet, Take by mouth daily., Disp: , Rfl:  .  ibuprofen (ADVIL,MOTRIN) 800 MG tablet, Take 1 tablet (800 mg total) by mouth every 8 (eight) hours as needed for moderate pain. (Patient not taking: Reported on 05/16/2017), Disp: 270 tablet, Rfl: 1 .  Methotrexate, PF, 20 MG/0.4ML SOAJ, Inject into the skin. Inject 20 mg subcutaneously once a week., Disp: , Rfl:  .  metoprolol tartrate (LOPRESSOR) 25 MG tablet, Take 0.5 tablets (12.5 mg total) by mouth 2 (two) times daily., Disp: 60 tablet, Rfl: 0 .  oxybutynin (DITROPAN) 5 MG tablet, Take 1 tablet (5 mg total) by mouth 2 (two) times daily as needed for bladder spasms., Disp: 180 tablet, Rfl: 1 .  pravastatin (PRAVACHOL) 40 MG tablet, Take 1 tablet (40 mg total) by mouth at bedtime., Disp: 30 tablet, Rfl: 0 .  predniSONE (DELTASONE) 5 MG tablet, Take  5 mg by mouth daily with breakfast., Disp: , Rfl:  .  promethazine (PHENERGAN) 25 MG tablet, Take 1 tablet (25 mg total) by mouth every 8 (eight) hours as needed for nausea or vomiting. (Patient not taking: Reported on 05/16/2017), Disp: 120 tablet, Rfl: 0 .  senna-docusate (SENOKOT-S) 8.6-50 MG per tablet, Take 1 tablet by mouth 2 (two) times daily. (Patient not taking: Reported on 05/16/2017), Disp: 60 tablet, Rfl: 5 .  valACYclovir (VALTREX) 1000 MG tablet, as needed., Disp: , Rfl:   Review of Systems  Constitutional: Negative for appetite change, chills, fatigue and fever.  Respiratory: Negative for chest tightness and  shortness of breath.   Cardiovascular: Negative for chest pain and palpitations.  Gastrointestinal: Negative for abdominal pain, nausea and vomiting.  Neurological: Negative for dizziness and weakness.    Social History  Substance Use Topics  . Smoking status: Never Smoker  . Smokeless tobacco: Never Used  . Alcohol use No   Objective:   There were no vitals taken for this visit. There were no vitals filed for this visit.  BP  88/52 (BP Location: Left Arm)   Pulse 80   Temp 98.3 F (36.8 C) (Oral)   Ht 5\' 1"  (1.549 m)   Wt 116 lb 9.6 oz (52.9 kg)   BMI 22.03 kg/m   BSA 1.51 m      Physical Exam   General Appearance:    Alert, cooperative, no distress  Eyes:    PERRL, conjunctiva/corneas clear, EOM's intact       Lungs:     Clear to auscultation bilaterally, respirations unlabored  Heart:    Regular rate and rhythm  Neurologic:   Awake, alert, oriented x 3. No apparent focal neurological           defect.           Assessment & Plan:     1. Arteriosclerosis of coronary artery Asymptomatic. Compliant with medication.  Continue aggressive risk factor modification.   - Lipid panel  2. Subclinical hypothyroidism  - TSH  3. Osteoporosis, unspecified osteoporosis type, unspecified pathological fracture presence Followed by Dr. Jefm Bryant.  4. Hypercholesteremia She is tolerating pravastatin well with no adverse effects.    5. Avitaminosis D - VITAMIN D 25 Hydroxy (Vit-D Deficiency, Fractures)  6. Need for pneumococcal vaccination  - Pneumococcal polysaccharide vaccine 23-valent greater than or equal to 2yo subcutaneous/IM  7. Need for hepatitis C screening test  - Hepatitis C antibody  8. 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 3 months supply.  Dispense: 540  tablet; Refill: 0       Lelon Huh, MD  [facility]

## 2017-05-18 NOTE — Patient Instructions (Signed)
Tina Salinas , Thank you for taking time to come for your Medicare Wellness Visit. I appreciate your ongoing commitment to your health goals. Please review the following plan we discussed and let me know if I can assist you in the future.   Screening recommendations/referrals: Colonoscopy: Up to date Mammogram: pt to set this up Bone Density: Up to date Recommended yearly ophthalmology/optometry visit for glaucoma screening and checkup Recommended yearly dental visit for hygiene and checkup  Vaccinations: Influenza vaccine: completed Pneumococcal vaccine: declined Pneumovax 23 today Tdap vaccine: completed Shingles vaccine: completed  Advanced directives: Advance directive discussed with you today. I have provided a copy for you to complete at home and have notarized. Once this is complete please bring a copy in to our office so we can scan it into your chart.  Conditions/risks identified: Recommend increasing water intake to 4-6 gasses a day.   Next appointment: 2:30 PM today   Preventive Care 65 Years and Older, Female Preventive care refers to lifestyle choices and visits with your health care provider that can promote health and wellness. What does preventive care include?  A yearly physical exam. This is also called an annual well check.  Dental exams once or twice a year.  Routine eye exams. Ask your health care provider how often you should have your eyes checked.  Personal lifestyle choices, including:  Daily care of your teeth and gums.  Regular physical activity.  Eating a healthy diet.  Avoiding tobacco and drug use.  Limiting alcohol use.  Practicing safe sex.  Taking low-dose aspirin every day.  Taking vitamin and mineral supplements as recommended by your health care provider. What happens during an annual well check? The services and screenings done by your health care provider during your annual well check will depend on your age, overall health,  lifestyle risk factors, and family history of disease. Counseling  Your health care provider may ask you questions about your:  Alcohol use.  Tobacco use.  Drug use.  Emotional well-being.  Home and relationship well-being.  Sexual activity.  Eating habits.  History of falls.  Memory and ability to understand (cognition).  Work and work Statistician.  Reproductive health. Screening  You may have the following tests or measurements:  Height, weight, and BMI.  Blood pressure.  Lipid and cholesterol levels. These may be checked every 5 years, or more frequently if you are over 36 years old.  Skin check.  Lung cancer screening. You may have this screening every year starting at age 46 if you have a 30-pack-year history of smoking and currently smoke or have quit within the past 15 years.  Fecal occult blood test (FOBT) of the stool. You may have this test every year starting at age 67.  Flexible sigmoidoscopy or colonoscopy. You may have a sigmoidoscopy every 5 years or a colonoscopy every 10 years starting at age 29.  Hepatitis C blood test.  Hepatitis B blood test.  Sexually transmitted disease (STD) testing.  Diabetes screening. This is done by checking your blood sugar (glucose) after you have not eaten for a while (fasting). You may have this done every 1-3 years.  Bone density scan. This is done to screen for osteoporosis. You may have this done starting at age 47.  Mammogram. This may be done every 1-2 years. Talk to your health care provider about how often you should have regular mammograms. Talk with your health care provider about your test results, treatment options, and if necessary, the  need for more tests. Vaccines  Your health care provider may recommend certain vaccines, such as:  Influenza vaccine. This is recommended every year.  Tetanus, diphtheria, and acellular pertussis (Tdap, Td) vaccine. You may need a Td booster every 10 years.  Zoster  vaccine. You may need this after age 68.  Pneumococcal 13-valent conjugate (PCV13) vaccine. One dose is recommended after age 39.  Pneumococcal polysaccharide (PPSV23) vaccine. One dose is recommended after age 74. Talk to your health care provider about which screenings and vaccines you need and how often you need them. This information is not intended to replace advice given to you by your health care provider. Make sure you discuss any questions you have with your health care provider. Document Released: 07/30/2015 Document Revised: 03/22/2016 Document Reviewed: 05/04/2015 Elsevier Interactive Patient Education  2017 [place] Prevention in the Home Falls can cause injuries. They can happen to people of all ages. There are many things you can do to make your home safe and to help prevent falls. What can I do on the outside of my home?  Regularly fix the edges of walkways and driveways and fix any cracks.  Remove anything that might make you trip as you walk through a door, such as a raised step or threshold.  Trim any bushes or trees on the path to your home.  Use bright outdoor lighting.  Clear any walking paths of anything that might make someone trip, such as rocks or tools.  Regularly check to see if handrails are loose or broken. Make sure that both sides of any steps have handrails.  Any raised decks and porches should have guardrails on the edges.  Have any leaves, snow, or ice cleared regularly.  Use sand or salt on walking paths during winter.  Clean up any spills in your garage right away. This includes oil or grease spills. What can I do in the bathroom?  Use night lights.  Install grab bars by the toilet and in the tub and shower. Do not use towel bars as grab bars.  Use non-skid mats or decals in the tub or shower.  If you need to sit down in the shower, use a plastic, non-slip stool.  Keep the floor dry. Clean up any water that spills on the  floor as soon as it happens.  Remove soap buildup in the tub or shower regularly.  Attach bath mats securely with double-sided non-slip rug tape.  Do not have throw rugs and other things on the floor that can make you trip. What can I do in the bedroom?  Use night lights.  Make sure that you have a light by your bed that is easy to reach.  Do not use any sheets or blankets that are too big for your bed. They should not hang down onto the floor.  Have a firm chair that has side arms. You can use this for support while you get dressed.  Do not have throw rugs and other things on the floor that can make you trip. What can I do in the kitchen?  Clean up any spills right away.  Avoid walking on wet floors.  Keep items that you use a lot in easy-to-reach places.  If you need to reach something above you, use a strong step stool that has a grab bar.  Keep electrical cords out of the way.  Do not use floor polish or wax that makes floors slippery. If you must use wax,  use non-skid floor wax.  Do not have throw rugs and other things on the floor that can make you trip. What can I do with my stairs?  Do not leave any items on the stairs.  Make sure that there are handrails on both sides of the stairs and use them. Fix handrails that are broken or loose. Make sure that handrails are as long as the stairways.  Check any carpeting to make sure that it is firmly attached to the stairs. Fix any carpet that is loose or worn.  Avoid having throw rugs at the top or bottom of the stairs. If you do have throw rugs, attach them to the floor with carpet tape.  Make sure that you have a light switch at the top of the stairs and the bottom of the stairs. If you do not have them, ask someone to add them for you. What else can I do to help prevent falls?  Wear shoes that:  Do not have high heels.  Have rubber bottoms.  Are comfortable and fit you well.  Are closed at the toe. Do not wear  sandals.  If you use a stepladder:  Make sure that it is fully opened. Do not climb a closed stepladder.  Make sure that both sides of the stepladder are locked into place.  Ask someone to hold it for you, if possible.  Clearly mark and make sure that you can see:  Any grab bars or handrails.  First and last steps.  Where the edge of each step is.  Use tools that help you move around (mobility aids) if they are needed. These include:  Canes.  Walkers.  Scooters.  Crutches.  Turn on the lights when you go into a dark area. Replace any light bulbs as soon as they burn out.  Set up your furniture so you have a clear path. Avoid moving your furniture around.  If any of your floors are uneven, fix them.  If there are any pets around you, be aware of where they are.  Review your medicines with your doctor. Some medicines can make you feel dizzy. This can increase your chance of falling. Ask your doctor what other things that you can do to help prevent falls. This information is not intended to replace advice given to you by your health care provider. Make sure you discuss any questions you have with your health care provider. Document Released: 04/29/2009 Document Revised: 12/09/2015 Document Reviewed: 08/07/2014 Elsevier Interactive Patient Education  2017 [employer].

## 2017-05-18 NOTE — Progress Notes (Signed)
Subjective:   Tina Salinas is a 71 y.o. female who presents for Medicare Annual (Subsequent) preventive examination.  Review of Systems:  N/A  Cardiac Risk Factors include: advanced age (>27men, >22 women);dyslipidemia     Objective:     Vitals: BP (!) 88/52 (BP Location: Left Arm)   Pulse 80   Temp 98.3 F (36.8 C) (Oral)   Ht 5\' 1"  (1.549 m)   Wt 116 lb 9.6 oz (52.9 kg)   BMI 22.03 kg/m   Body mass index is 22.03 kg/m.   Tobacco History  Smoking Status  . Never Smoker  Smokeless Tobacco  . Never Used     Counseling given: Not Answered   Past Medical History:  Diagnosis Date  . Hyperlipidemia    Past Surgical History:  Procedure Laterality Date  . BREAST ENHANCEMENT SURGERY Bilateral 1980  . CORONARY ANGIOPLASTY WITH STENT PLACEMENT  07/13/2008, 08/19/2008  . HIP ARTHROPLASTY N/A 2000  . LEFT HEART CATH AND CORONARY ANGIOGRAPHY N/A 05/16/2017   Procedure: LEFT HEART CATH AND CORONARY ANGIOGRAPHY;  Surgeon: Teodoro Spray, MD;  Location: [facility];  Service: Cardiovascular;  Laterality: N/A;  . TOTAL KNEE ARTHROPLASTY Left 09/2007, 01/2008   History reviewed. No pertinent family history. History  Sexual Activity  . Sexual activity: Not on file    Outpatient Encounter Prescriptions as of 05/18/2017  Medication Sig  . acyclovir (ZOVIRAX) 800 MG tablet Take 1 tablet (800 mg total) by mouth 3 (three) times daily as needed.  Marland Kitchen alendronate (FOSAMAX) 70 MG tablet Take by mouth once a week.  . ALPRAZolam (XANAX) 0.5 MG tablet TAKE 1/2 TO 1 TABLET AT BEDTIME  . aspirin 81 MG tablet Take 81 mg by mouth daily.  . cholecalciferol (VITAMIN D) 1000 UNITS tablet Take by mouth daily.  . cyclobenzaprine (FLEXERIL) 10 MG tablet Take 1 tablet (10 mg total) by mouth 3 (three) times daily as needed.  . fluticasone (FLONASE) 50 MCG/ACT nasal spray Place 2 sprays into both nostrils daily.  . folic acid (FOLVITE) 1 MG tablet Take by mouth daily.  Marland Kitchen  HYDROcodone-acetaminophen (NORCO) 7.5-325 MG tablet Take 2 tablets by mouth 3 (three) times daily. 3 months supply.  . hydroxychloroquine (PLAQUENIL) 200 MG tablet Take by mouth daily.  Marland Kitchen ibuprofen (ADVIL,MOTRIN) 800 MG tablet Take 1 tablet (800 mg total) by mouth every 8 (eight) hours as needed for moderate pain.  . Methotrexate, PF, 20 MG/0.4ML SOAJ Inject into the skin. Inject 20 mg subcutaneously once a week.  . Methylcellulose, Laxative, (GNP FIBER THERAPY PO) Take by mouth.  . metoprolol tartrate (LOPRESSOR) 25 MG tablet Take 0.5 tablets (12.5 mg total) by mouth 2 (two) times daily.  Marland Kitchen oxybutynin (DITROPAN) 5 MG tablet Take 1 tablet (5 mg total) by mouth 2 (two) times daily as needed for bladder spasms.  . pravastatin (PRAVACHOL) 40 MG tablet Take 1 tablet (40 mg total) by mouth at bedtime.  . predniSONE (DELTASONE) 5 MG tablet Take 5 mg by mouth daily with breakfast.  . promethazine (PHENERGAN) 25 MG tablet Take 1 tablet (25 mg total) by mouth every 8 (eight) hours as needed for nausea or vomiting.  . valACYclovir (VALTREX) 1000 MG tablet as needed.  . [DISCONTINUED] senna-docusate (SENOKOT-S) 8.6-50 MG per tablet Take 1 tablet by mouth 2 (two) times daily.   No facility-administered encounter medications on file as of 05/18/2017.     Activities of Daily Living In your present state of health, do you have  any difficulty performing the following activities: 05/18/2017 06/30/2016  Hearing? N N  Vision? N N  Difficulty concentrating or making decisions? N N  Walking or climbing stairs? N Y  Dressing or bathing? N N  Doing errands, shopping? N N  Preparing Food and eating ? N -  Using the Toilet? N -  In the past six months, have you accidently leaked urine? Y -  Comment on Oxybutynin for this -  Do you have problems with loss of bowel control? N -  Managing your Medications? N -  Managing your Finances? N -  Housekeeping or managing your Housekeeping? N -  Some recent data might be  hidden    Patient Care Team: Birdie Sons, MD as PCP - General (Family Medicine) Emmaline Kluver., MD (Rheumatology) Teodoro Spray, MD as Consulting Physician (Cardiology) Lorelee Cover., MD as Consulting Physician (Ophthalmology)    Assessment:     Exercise Activities and Dietary recommendations Current Exercise Habits: The patient does not participate in regular exercise at present, Exercise limited by: orthopedic condition(s)  Goals    . Increase water intake          Recommend increasing water intake to 4-6 gasses a day.       Fall Risk Fall Risk  05/18/2017 06/30/2016 06/28/2015  Falls in the past year? No Yes No  Number falls in past yr: - 1 -  Injury with Fall? - No -   Depression Screen PHQ 2/9 Scores 05/18/2017 06/30/2016 06/28/2015  PHQ - 2 Score 0 1 0  PHQ- 9 Score - 1 -     Cognitive Function     6CIT Screen 05/18/2017  What Year? 0 points  What month? 0 points  What time? 0 points  Count back from 20 0 points  Months in reverse 0 points  Repeat phrase 4 points  Total Score 4    Immunization History  Administered Date(s) Administered  . Influenza, High Dose Seasonal PF 04/17/2015, 03/30/2016  . Influenza-Unspecified 03/17/2014  . PPD Test 06/02/2014  . Pneumococcal Conjugate-13 03/30/2014  . Pneumococcal Polysaccharide-23 04/10/2012  . Td 01/29/2017  . Zoster 03/13/2011   Screening Tests Health Maintenance  Topic Date Due  . Hepatitis C Screening  18-Oct-1945  . MAMMOGRAM  05/24/2015  . INFLUENZA VACCINE  02/14/2017  . PNA vac Low Risk Adult (2 of 2 - PPSV23) 04/10/2017  . COLONOSCOPY  06/03/2023  . TETANUS/TDAP  01/30/2027  . DEXA SCAN  Completed      Plan:  I have personally reviewed and addressed the Medicare Annual Wellness questionnaire and have noted the following in the patient's chart:  A. Medical and social history B. Use of alcohol, tobacco or illicit drugs  C. Current medications and supplements D. Functional  ability and status E.  Nutritional status F.  Physical activity G. Advance directives H. List of other physicians I.  Hospitalizations, surgeries, and ER visits in previous 12 months J.  [place] such as hearing and vision if needed, cognitive and depression L. Referrals and appointments - none  In addition, I have reviewed and discussed with patient certain preventive protocols, quality metrics, and best practice recommendations. A written personalized care plan for preventive services as well as general preventive health recommendations were provided to patient.  See attached scanned questionnaire for additional information.   Signed,  Fabio Neighbors, LPN Nurse Health Advisor   MD Recommendations: None. Pt declined the Hepatitis C screening today. Pt to  have the Hep C lab done with additional blood work at physical in 12/18. Pt would like to discuss receiving the Pneumovax 23 with PCP today. Pt states she will set up a mammogram this year.

## 2017-05-28 ENCOUNTER — Other Ambulatory Visit: Payer: Self-pay | Admitting: Family Medicine

## 2017-05-28 DIAGNOSIS — M05749 Rheumatoid arthritis with rheumatoid factor of unspecified hand without organ or systems involvement: Secondary | ICD-10-CM

## 2017-05-31 ENCOUNTER — Other Ambulatory Visit: Payer: Self-pay | Admitting: Family Medicine

## 2017-06-01 ENCOUNTER — Ambulatory Visit: Admit: 2017-06-01 | Payer: Self-pay | Admitting: Gastroenterology

## 2017-06-01 SURGERY — COLONOSCOPY WITH PROPOFOL
Anesthesia: General

## 2017-06-21 ENCOUNTER — Telehealth: Payer: Self-pay | Admitting: Family Medicine

## 2017-06-21 DIAGNOSIS — Z1231 Encounter for screening mammogram for malignant neoplasm of breast: Secondary | ICD-10-CM

## 2017-06-21 NOTE — Telephone Encounter (Signed)
Patient is also due for a mammogram. Ok to put the order in if she agrees? Please advise. Thanks!

## 2017-06-21 NOTE — Telephone Encounter (Signed)
Please advise 

## 2017-06-21 NOTE — Telephone Encounter (Signed)
Pt is requesting a lab slip to have labs done before her CPE.  CB#[phone]/MW

## 2017-06-26 NOTE — Telephone Encounter (Signed)
OK for screening mammogram

## 2017-06-26 NOTE — Telephone Encounter (Signed)
Mammogram ordered. Pt aware to call number to schedule. She usually gets these done through [facility], and will need to contact them to ensure it has been a year since her mammogram.  FYI- pt is asking about Td vaccine given on 01/29/2017. Is not covered by insurance. Was informed that the diagnosis code was changed, but she states she is still getting calls from collection agency regarding this charge. Please review.

## 2017-06-28 ENCOUNTER — Inpatient Hospital Stay: Admission: RE | Admit: 2017-06-28 | Payer: Medicare PPO | Source: Ambulatory Visit

## 2017-07-02 ENCOUNTER — Encounter: Payer: Self-pay | Admitting: Family Medicine

## 2017-07-02 ENCOUNTER — Telehealth: Payer: Self-pay | Admitting: Family Medicine

## 2017-07-02 NOTE — Telephone Encounter (Signed)
Please review. Ok to order labs?  

## 2017-07-02 NOTE — Telephone Encounter (Signed)
Please print req for labs ordered 05-18-17

## 2017-07-02 NOTE — Telephone Encounter (Signed)
Pt is requesting a lab slip to have labs done before her Physical.  CB#[phone]/MW

## 2017-07-03 NOTE — Telephone Encounter (Signed)
Lab reg printed and pt advised.

## 2017-07-13 ENCOUNTER — Other Ambulatory Visit: Payer: Self-pay

## 2017-07-13 ENCOUNTER — Other Ambulatory Visit: Payer: Self-pay | Admitting: Family Medicine

## 2017-07-13 ENCOUNTER — Ambulatory Visit (INDEPENDENT_AMBULATORY_CARE_PROVIDER_SITE_OTHER): Payer: Medicare PPO | Admitting: Family Medicine

## 2017-07-13 ENCOUNTER — Encounter: Payer: Self-pay | Admitting: Family Medicine

## 2017-07-13 VITALS — BP 92/58 | HR 76 | Temp 98.6°F | Resp 16 | Ht 61.0 in | Wt 117.0 lb

## 2017-07-13 DIAGNOSIS — Z1159 Encounter for screening for other viral diseases: Secondary | ICD-10-CM

## 2017-07-13 DIAGNOSIS — E038 Other specified hypothyroidism: Secondary | ICD-10-CM

## 2017-07-13 DIAGNOSIS — E039 Hypothyroidism, unspecified: Secondary | ICD-10-CM

## 2017-07-13 DIAGNOSIS — E559 Vitamin D deficiency, unspecified: Secondary | ICD-10-CM | POA: Diagnosis not present

## 2017-07-13 DIAGNOSIS — I2510 Atherosclerotic heart disease of native coronary artery without angina pectoris: Secondary | ICD-10-CM | POA: Diagnosis not present

## 2017-07-13 DIAGNOSIS — Z0000 Encounter for general adult medical examination without abnormal findings: Secondary | ICD-10-CM

## 2017-07-13 MED ORDER — FLUTICASONE PROPIONATE 50 MCG/ACT NA SUSP: 2.0000 | Freq: Every day | NASAL | 6 refills | Status: DC

## 2017-07-13 NOTE — Telephone Encounter (Signed)
[facility] pharmacy faxed a refill request for the following medication. Thanks CC  fluticasone (FLONASE) 50 MCG/ACT nasal spray

## 2017-07-13 NOTE — Telephone Encounter (Signed)
Rx was already sent

## 2017-07-13 NOTE — Telephone Encounter (Signed)
Patient was seen for an office visit today and forgot to mention that she needs a refill on Flonase sent to her mail order pharmacy.

## 2017-07-13 NOTE — Patient Instructions (Addendum)
   The CDC recommends two doses of Shingrix (the shingles vaccine) separated by 2 to 6 months for adults age 69 years and older. I recommend checking with your insurance plan regarding coverage for this vaccine.    Please call the [facility] [phone]) to schedule a routine screening mammogram.
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
## 2017-08-09 ENCOUNTER — Other Ambulatory Visit: Payer: Self-pay | Admitting: Family Medicine

## 2017-08-09 DIAGNOSIS — F4322 Adjustment disorder with anxiety: Secondary | ICD-10-CM

## 2017-09-06 ENCOUNTER — Telehealth: Payer: Self-pay | Admitting: Family Medicine

## 2017-09-06 NOTE — Telephone Encounter (Signed)
Patient advised lab order is still active from December 2018. Lab slip reprinted.

## 2017-09-06 NOTE — Telephone Encounter (Signed)
Pt stated she didn't have her labs done that were ordered in December 2018. Pt is requesting to pick up a new lab slip so she can have the same labs done before her F/U appt on 09/11/17. Pt request a call back when lab slip is ready. Please advise. Thanks TNP

## 2017-09-11 ENCOUNTER — Ambulatory Visit: Payer: Medicare PPO | Admitting: Family Medicine

## 2017-09-11 ENCOUNTER — Encounter: Payer: Self-pay | Admitting: Family Medicine

## 2017-09-11 VITALS — BP 124/72 | HR 83 | Temp 98.3°F | Resp 18 | Wt 119.6 lb

## 2017-09-11 DIAGNOSIS — R110 Nausea: Secondary | ICD-10-CM | POA: Diagnosis not present

## 2017-09-11 DIAGNOSIS — E039 Hypothyroidism, unspecified: Secondary | ICD-10-CM

## 2017-09-11 DIAGNOSIS — G4700 Insomnia, unspecified: Secondary | ICD-10-CM | POA: Diagnosis not present

## 2017-09-11 DIAGNOSIS — I10 Essential (primary) hypertension: Secondary | ICD-10-CM

## 2017-09-11 DIAGNOSIS — M05749 Rheumatoid arthritis with rheumatoid factor of unspecified hand without organ or systems involvement: Secondary | ICD-10-CM

## 2017-09-11 DIAGNOSIS — E038 Other specified hypothyroidism: Secondary | ICD-10-CM

## 2017-09-11 MED ORDER — ALPRAZOLAM 0.5 MG PO TABS: ORAL_TABLET | ORAL | 1 refills | Status: DC

## 2017-09-11 MED ORDER — HYDROCODONE-ACETAMINOPHEN 7.5-325 MG PO TABS: 2.0000 | ORAL_TABLET | Freq: Three times a day (TID) | ORAL | 0 refills | Status: DC

## 2017-09-11 MED ORDER — CYCLOBENZAPRINE HCL 10 MG PO TABS: 10.0000 mg | ORAL_TABLET | Freq: Three times a day (TID) | ORAL | 0 refills | Status: DC | PRN

## 2017-09-11 MED ORDER — PROMETHAZINE HCL 25 MG PO TABS: 25.0000 mg | ORAL_TABLET | Freq: Three times a day (TID) | ORAL | 0 refills | Status: DC | PRN

## 2017-09-11 MED ORDER — PROMETHAZINE HCL 25 MG PO TABS: 25.0000 mg | ORAL_TABLET | Freq: Three times a day (TID) | ORAL | 1 refills | Status: DC | PRN

## 2017-09-11 NOTE — Progress Notes (Signed)
Patient: Tina Salinas Female    DOB: 03-03-1946   72 y.o.   MRN: [id number] Visit Date: 09/11/2017  Today's Provider: Lelon Huh, MD   No chief complaint on file.  Subjective:    HPI  Hypertension, follow-up:  BP Readings from Last 3 Encounters:  09/11/17 124/72  07/13/17 (!) 92/58  05/18/17 (!) 88/52    She was last seen for hypertension 7 months ago.  BP at that visit was 102/62. Management since that visit includes no changes. She reports good compliance with treatment. She is not having side effects.  She is not exercising. She is adherent to low salt diet.   Outside blood pressures are not being checked. She is experiencing none.  Patient denies exertional chest pressure/discomfort, fatigue and palpitations.   Cardiovascular risk factors include sedentary lifestyle.   Weight trend: stable Wt Readings from Last 3 Encounters:  09/11/17 119 lb 9.6 oz (54.3 kg)  07/13/17 117 lb (53.1 kg)  05/18/17 116 lb 9.6 oz (52.9 kg)    Current diet: well balanced  Hypothyroidism, follow up: Patient was seen 8 weeks ago. No changes were made in her medications. She is currently not taking anything for her thyroid. TSH was ordered in December but she has not been to the lab yet.  Lab Results  Component Value Date   TSH 4.320 07/16/2015    Follow up pain medications for RA which she feels are working well and wishes to continue  Follow up on alprazolam which she takes every night. She requests any increase in dose to 1mg  , but advised should only take lowest effective dose due to potential of cognitive side effects.      Allergies  Allergen Reactions  . Etodolac     Other reaction(s): Unknown  . Sulfa Antibiotics   . Tetracycline      Current Outpatient Medications:  .  acyclovir (ZOVIRAX) 800 MG tablet, Take 1 tablet (800 mg total) by mouth 3 (three) times daily as needed., Disp: 270 tablet, Rfl: 3 .  alendronate (FOSAMAX) 70 MG tablet, Take by mouth once  a week., Disp: , Rfl:  .  ALPRAZolam (XANAX) 0.5 MG tablet, TAKE 1/2 TO 1 TABLET AT BEDTIME, Disp: 90 tablet, Rfl: 1 .  aspirin 81 MG tablet, Take 81 mg by mouth daily., Disp: , Rfl:  .  carvedilol (COREG) 3.125 MG tablet, Take 1 tablet by mouth 2 (two) times daily., Disp: , Rfl:  .  cholecalciferol (VITAMIN D) 1000 UNITS tablet, Take by mouth daily., Disp: , Rfl:  .  cyclobenzaprine (FLEXERIL) 10 MG tablet, Take 1 tablet (10 mg total) by mouth 3 (three) times daily as needed., Disp: 270 tablet, Rfl: 0 .  fluticasone (FLONASE) 50 MCG/ACT nasal spray, Place 2 sprays into both nostrils daily., Disp: 16 g, Rfl: 6 .  folic acid (FOLVITE) 1 MG tablet, Take by mouth daily., Disp: , Rfl:  .  HYDROcodone-acetaminophen (NORCO) 7.5-325 MG tablet, Take 2 tablets by mouth 3 (three) times daily. 3 months supply., Disp: 540 tablet, Rfl: 0 .  hydroxychloroquine (PLAQUENIL) 200 MG tablet, Take by mouth daily., Disp: , Rfl:  .  ibuprofen (ADVIL,MOTRIN) 800 MG tablet, TAKE 1 TABLET EVERY 8 HOURS AS NEEDED  FOR  MODERATE  PAIN, Disp: 270 tablet, Rfl: 3 .  Methotrexate, PF, 20 MG/0.4ML SOAJ, Inject into the skin. Inject 20 mg subcutaneously once a week., Disp: , Rfl:  .  Methylcellulose, Laxative, (GNP FIBER THERAPY PO), Take  by mouth., Disp: , Rfl:  .  oxybutynin (DITROPAN) 5 MG tablet, TAKE 1 TABLET TWICE DAILY AS NEEDED  FOR  BLADDER  SPASMS, Disp: 180 tablet, Rfl: 4 .  pravastatin (PRAVACHOL) 40 MG tablet, Take 1 tablet (40 mg total) by mouth at bedtime., Disp: 30 tablet, Rfl: 0 .  predniSONE (DELTASONE) 5 MG tablet, Take 5 mg by mouth daily with breakfast., Disp: , Rfl:  .  promethazine (PHENERGAN) 25 MG tablet, Take 1 tablet (25 mg total) by mouth every 8 (eight) hours as needed for nausea or vomiting., Disp: 120 tablet, Rfl: 0 .  valACYclovir (VALTREX) 1000 MG tablet, as needed., Disp: , Rfl:   Review of Systems  Constitutional: Negative.   Respiratory: Negative.   Cardiovascular: Negative.     Musculoskeletal: Positive for arthralgias, back pain, gait problem and myalgias.    Social History   Tobacco Use  . Smoking status: Never Smoker  . Smokeless tobacco: Never Used  Substance Use Topics  . Alcohol use: No   Objective:   BP 124/72 (BP Location: Right Arm, Patient Position: Sitting, Cuff Size: Normal)   Pulse 83   Temp 98.3 F (36.8 C)   Resp 18   Wt 119 lb 9.6 oz (54.3 kg)   SpO2 98%   BMI 22.60 kg/m  Vitals:   09/11/17 1652  BP: 124/72  Pulse: 83  Resp: 18  Temp: 98.3 F (36.8 C)  SpO2: 98%  Weight: 119 lb 9.6 oz (54.3 kg)     Physical Exam   General Appearance:    Alert, cooperative, no distress  Eyes:    PERRL, conjunctiva/corneas clear, EOM's intact       eLungs:     Clear to auscultation bilaterally, respirations unlabored  Heart:    Regular rate and rhythm  Neurologic:   Awake, alert, oriented x 3. No apparent focal neurological           defect.           Assessment & Plan:     1. 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  - HYDROcodone-acetaminophen (NORCO) 7.5-325 MG tablet; Take 2 tablets by mouth 3 (three) times daily. 3 months supply.  Dispense: 540 tablet; Refill: 0 - cyclobenzaprine (FLEXERIL) 10 MG tablet; Take 1 tablet (10 mg total) by mouth 3 (three) times daily as needed.  Dispense: 270 tablet; Refill: 0  2. Essential (primary) hypertension Well controlled.  Continue current medications.    4. Subclinical hypothyroidism She has order for TSH but not done yet. She states she will be getting blood work done by Dr. Jefm Bryant soon and will return to our lab after words  5. Insomnia, unspecified type Doing well on  ALPRAZolam (XANAX) 0.5 MG tablet; TAKE 1/2 TO 1 TABLET AT BEDTIME  Dispense: 90 tablet; Refill: 1 Which is refilled today.   6. Nausea She requests refill for  promethazine (PHENERGAN)  25 MG tablet; Take 1 tablet (25 mg total) by mouth every 8 (eight) hours as needed for nausea or vomiting.  Dispense: 75 tablet; Refill: 1 Which she states she takes from time to time and has had refilled since 2017.   Return in about 3 months (around 12/09/2017).       Lelon Huh, MD  [facility]

## 2017-09-17 ENCOUNTER — Other Ambulatory Visit: Payer: Self-pay | Admitting: Orthopedic Surgery

## 2017-09-27 ENCOUNTER — Other Ambulatory Visit: Payer: Self-pay | Admitting: Orthopedic Surgery

## 2017-10-22 ENCOUNTER — Telehealth: Payer: Self-pay | Admitting: Family Medicine

## 2017-10-22 NOTE — Telephone Encounter (Signed)
Pt requesting lab slip.  States she wasn't able to get the lab due to being ill and now the slip has expired.  Pt states to leave a message when the lab slip is ready for pick up.

## 2017-10-22 NOTE — Telephone Encounter (Signed)
Patient advised labs ordered are still active. ahe just needs to come by office and get labs drawn.

## 2017-11-14 ENCOUNTER — Telehealth: Payer: Self-pay

## 2017-11-14 ENCOUNTER — Ambulatory Visit: Payer: Self-pay | Admitting: Family Medicine

## 2017-11-14 NOTE — Telephone Encounter (Signed)
Patient called the office to reschedule her missed appointment from this morning. She told me she lost her balance and fell onto the floor. Patient has not been able to get up. She states she called her neighbor who is on his way to help her up. Patient denies sustaining any injuries or bleeding. She denies any loss of consciousness. Patient refused to call 911, and refused to have me call 911 for her. She says she is fine and her neighbor will arrive shortly. Patient rescheduled her appointment for tomorrow at 11:20am. I called patient back 10 minutes later and spoke with her neighbor who was with patient. She confirmed with me that the patient is out of the floor and is ok.

## 2017-11-14 NOTE — Progress Notes (Deleted)
Patient: Tina Salinas Female    DOB: 11/06/1945   72 y.o.   MRN: [id number] Visit Date: 11/14/2017  Today's Provider: Lelon Huh, MD   No chief complaint on file.  Subjective:    Cough  Pertinent negatives include no chest pain, chills, fever or shortness of breath.       Allergies  Allergen Reactions  . Etodolac     Other reaction(s): Unknown  . Sulfa Antibiotics   . Tetracycline      Current Outpatient Medications:  .  acyclovir (ZOVIRAX) 800 MG tablet, Take 1 tablet (800 mg total) by mouth 3 (three) times daily as needed., Disp: 270 tablet, Rfl: 3 .  alendronate (FOSAMAX) 70 MG tablet, Take by mouth once a week., Disp: , Rfl:  .  ALPRAZolam (XANAX) 0.5 MG tablet, TAKE 1/2 TO 1 TABLET AT BEDTIME, Disp: 90 tablet, Rfl: 1 .  aspirin 81 MG tablet, Take 81 mg by mouth daily., Disp: , Rfl:  .  carvedilol (COREG) 3.125 MG tablet, Take 1 tablet by mouth 2 (two) times daily., Disp: , Rfl:  .  cholecalciferol (VITAMIN D) 1000 UNITS tablet, Take by mouth daily., Disp: , Rfl:  .  cyclobenzaprine (FLEXERIL) 10 MG tablet, Take 1 tablet (10 mg total) by mouth 3 (three) times daily as needed., Disp: 270 tablet, Rfl: 0 .  fluticasone (FLONASE) 50 MCG/ACT nasal spray, Place 2 sprays into both nostrils daily., Disp: 16 g, Rfl: 6 .  folic acid (FOLVITE) 1 MG tablet, Take by mouth daily., Disp: , Rfl:  .  HYDROcodone-acetaminophen (NORCO) 7.5-325 MG tablet, Take 2 tablets by mouth 3 (three) times daily. 3 months supply., Disp: 540 tablet, Rfl: 0 .  hydroxychloroquine (PLAQUENIL) 200 MG tablet, Take by mouth daily., Disp: , Rfl:  .  ibuprofen (ADVIL,MOTRIN) 800 MG tablet, TAKE 1 TABLET EVERY 8 HOURS AS NEEDED  FOR  MODERATE  PAIN, Disp: 270 tablet, Rfl: 3 .  Methotrexate, PF, 20 MG/0.4ML SOAJ, Inject into the skin. Inject 20 mg subcutaneously once a week., Disp: , Rfl:  .  Methylcellulose, Laxative, (GNP FIBER THERAPY PO), Take by mouth., Disp: , Rfl:  .  oxybutynin (DITROPAN) 5 MG  tablet, TAKE 1 TABLET TWICE DAILY AS NEEDED  FOR  BLADDER  SPASMS, Disp: 180 tablet, Rfl: 4 .  pravastatin (PRAVACHOL) 40 MG tablet, Take 1 tablet (40 mg total) by mouth at bedtime., Disp: 30 tablet, Rfl: 0 .  predniSONE (DELTASONE) 5 MG tablet, Take 5 mg by mouth daily with breakfast., Disp: , Rfl:  .  promethazine (PHENERGAN) 25 MG tablet, Take 1 tablet (25 mg total) by mouth every 8 (eight) hours as needed for nausea or vomiting., Disp: 75 tablet, Rfl: 1 .  valACYclovir (VALTREX) 1000 MG tablet, as needed., Disp: , Rfl:   Review of Systems  Constitutional: Negative for appetite change, chills, fatigue and fever.  Respiratory: Positive for cough. Negative for chest tightness and shortness of breath.   Cardiovascular: Negative for chest pain and palpitations.  Gastrointestinal: Negative for abdominal pain, nausea and vomiting.  Neurological: Negative for dizziness and weakness.    Social History   Tobacco Use  . Smoking status: Never Smoker  . Smokeless tobacco: Never Used  Substance Use Topics  . Alcohol use: No   Objective:   There were no vitals taken for this visit. There were no vitals filed for this visit.   Physical Exam      Assessment & Plan:  Lelon Huh, MD  [facility]

## 2017-11-15 ENCOUNTER — Encounter: Payer: Self-pay | Admitting: Emergency Medicine

## 2017-11-15 ENCOUNTER — Ambulatory Visit: Payer: Self-pay | Admitting: Physician Assistant

## 2017-11-15 ENCOUNTER — Encounter: Payer: Self-pay | Admitting: Family Medicine

## 2017-11-15 ENCOUNTER — Telehealth: Payer: Self-pay

## 2017-11-15 ENCOUNTER — Ambulatory Visit
Admission: RE | Admit: 2017-11-15 | Discharge: 2017-11-15 | Disposition: A | Discharge status: Home / Self Care | Payer: Medicare PPO | Source: Ambulatory Visit | Attending: Family Medicine | Admitting: Family Medicine

## 2017-11-15 ENCOUNTER — Ambulatory Visit: Payer: Medicare PPO | Admitting: Family Medicine

## 2017-11-15 ENCOUNTER — Other Ambulatory Visit: Payer: Self-pay

## 2017-11-15 ENCOUNTER — Inpatient Hospital Stay
Admission: EM | Admit: 2017-11-15 | Discharge: 2017-11-21 | DRG: 291 | Disposition: A | Discharge status: Skilled Nursing Facility | Payer: Medicare PPO | Attending: Internal Medicine | Admitting: Internal Medicine

## 2017-11-15 VITALS — BP 110/60 | HR 99 | Temp 98.4°F | Resp 16

## 2017-11-15 DIAGNOSIS — M810 Age-related osteoporosis without current pathological fracture: Secondary | ICD-10-CM | POA: Insufficient documentation

## 2017-11-15 DIAGNOSIS — M25552 Pain in left hip: Secondary | ICD-10-CM

## 2017-11-15 DIAGNOSIS — Z79899 Other long term (current) drug therapy: Secondary | ICD-10-CM

## 2017-11-15 DIAGNOSIS — I5043 Acute on chronic combined systolic (congestive) and diastolic (congestive) heart failure: Secondary | ICD-10-CM | POA: Diagnosis present

## 2017-11-15 DIAGNOSIS — R05 Cough: Secondary | ICD-10-CM | POA: Insufficient documentation

## 2017-11-15 DIAGNOSIS — J069 Acute upper respiratory infection, unspecified: Secondary | ICD-10-CM

## 2017-11-15 DIAGNOSIS — M79605 Pain in left leg: Secondary | ICD-10-CM

## 2017-11-15 DIAGNOSIS — R937 Abnormal findings on diagnostic imaging of other parts of musculoskeletal system: Secondary | ICD-10-CM | POA: Diagnosis present

## 2017-11-15 DIAGNOSIS — I4891 Unspecified atrial fibrillation: Secondary | ICD-10-CM | POA: Diagnosis present

## 2017-11-15 DIAGNOSIS — G809 Cerebral palsy, unspecified: Secondary | ICD-10-CM | POA: Diagnosis not present

## 2017-11-15 DIAGNOSIS — M059 Rheumatoid arthritis with rheumatoid factor, unspecified: Secondary | ICD-10-CM | POA: Diagnosis present

## 2017-11-15 DIAGNOSIS — R296 Repeated falls: Secondary | ICD-10-CM

## 2017-11-15 DIAGNOSIS — Z9114 Patient's other noncompliance with medication regimen: Secondary | ICD-10-CM

## 2017-11-15 DIAGNOSIS — Z882 Allergy status to sulfonamides status: Secondary | ICD-10-CM

## 2017-11-15 DIAGNOSIS — Z96652 Presence of left artificial knee joint: Secondary | ICD-10-CM | POA: Insufficient documentation

## 2017-11-15 DIAGNOSIS — Z881 Allergy status to other antibiotic agents status: Secondary | ICD-10-CM

## 2017-11-15 DIAGNOSIS — I480 Paroxysmal atrial fibrillation: Secondary | ICD-10-CM | POA: Diagnosis present

## 2017-11-15 DIAGNOSIS — R778 Other specified abnormalities of plasma proteins: Secondary | ICD-10-CM | POA: Diagnosis present

## 2017-11-15 DIAGNOSIS — I517 Cardiomegaly: Secondary | ICD-10-CM | POA: Insufficient documentation

## 2017-11-15 DIAGNOSIS — Z7952 Long term (current) use of systemic steroids: Secondary | ICD-10-CM

## 2017-11-15 DIAGNOSIS — I5042 Chronic combined systolic (congestive) and diastolic (congestive) heart failure: Secondary | ICD-10-CM | POA: Diagnosis present

## 2017-11-15 DIAGNOSIS — I10 Essential (primary) hypertension: Secondary | ICD-10-CM | POA: Diagnosis present

## 2017-11-15 DIAGNOSIS — J189 Pneumonia, unspecified organism: Secondary | ICD-10-CM

## 2017-11-15 DIAGNOSIS — Z811 Family history of alcohol abuse and dependence: Secondary | ICD-10-CM

## 2017-11-15 DIAGNOSIS — Z955 Presence of coronary angioplasty implant and graft: Secondary | ICD-10-CM

## 2017-11-15 DIAGNOSIS — I248 Other forms of acute ischemic heart disease: Secondary | ICD-10-CM | POA: Diagnosis present

## 2017-11-15 DIAGNOSIS — J9811 Atelectasis: Secondary | ICD-10-CM

## 2017-11-15 DIAGNOSIS — R410 Disorientation, unspecified: Secondary | ICD-10-CM | POA: Diagnosis not present

## 2017-11-15 DIAGNOSIS — Z7982 Long term (current) use of aspirin: Secondary | ICD-10-CM

## 2017-11-15 DIAGNOSIS — E785 Hyperlipidemia, unspecified: Secondary | ICD-10-CM | POA: Diagnosis present

## 2017-11-15 DIAGNOSIS — I110 Hypertensive heart disease with heart failure: Secondary | ICD-10-CM | POA: Diagnosis not present

## 2017-11-15 DIAGNOSIS — R059 Cough, unspecified: Secondary | ICD-10-CM

## 2017-11-15 DIAGNOSIS — Z7983 Long term (current) use of bisphosphonates: Secondary | ICD-10-CM

## 2017-11-15 DIAGNOSIS — I080 Rheumatic disorders of both mitral and aortic valves: Secondary | ICD-10-CM | POA: Diagnosis present

## 2017-11-15 DIAGNOSIS — R29898 Other symptoms and signs involving the musculoskeletal system: Secondary | ICD-10-CM

## 2017-11-15 DIAGNOSIS — R531 Weakness: Secondary | ICD-10-CM

## 2017-11-15 DIAGNOSIS — M19049 Primary osteoarthritis, unspecified hand: Secondary | ICD-10-CM

## 2017-11-15 DIAGNOSIS — R7989 Other specified abnormal findings of blood chemistry: Secondary | ICD-10-CM

## 2017-11-15 DIAGNOSIS — R0902 Hypoxemia: Secondary | ICD-10-CM | POA: Diagnosis present

## 2017-11-15 DIAGNOSIS — R0989 Other specified symptoms and signs involving the circulatory and respiratory systems: Secondary | ICD-10-CM | POA: Diagnosis present

## 2017-11-15 DIAGNOSIS — E440 Moderate protein-calorie malnutrition: Secondary | ICD-10-CM | POA: Diagnosis present

## 2017-11-15 DIAGNOSIS — I2510 Atherosclerotic heart disease of native coronary artery without angina pectoris: Secondary | ICD-10-CM | POA: Diagnosis present

## 2017-11-15 DIAGNOSIS — Z792 Long term (current) use of antibiotics: Secondary | ICD-10-CM

## 2017-11-15 DIAGNOSIS — E7800 Pure hypercholesterolemia, unspecified: Secondary | ICD-10-CM | POA: Diagnosis present

## 2017-11-15 DIAGNOSIS — Z6820 Body mass index (BMI) 20.0-20.9, adult: Secondary | ICD-10-CM

## 2017-11-15 DIAGNOSIS — I420 Dilated cardiomyopathy: Secondary | ICD-10-CM | POA: Diagnosis present

## 2017-11-15 DIAGNOSIS — R269 Unspecified abnormalities of gait and mobility: Secondary | ICD-10-CM | POA: Diagnosis present

## 2017-11-15 DIAGNOSIS — Z791 Long term (current) use of non-steroidal anti-inflammatories (NSAID): Secondary | ICD-10-CM

## 2017-11-15 DIAGNOSIS — I493 Ventricular premature depolarization: Secondary | ICD-10-CM | POA: Diagnosis present

## 2017-11-15 DIAGNOSIS — Z888 Allergy status to other drugs, medicaments and biological substances status: Secondary | ICD-10-CM

## 2017-11-15 DIAGNOSIS — Z86010 Personal history of colonic polyps: Secondary | ICD-10-CM

## 2017-11-15 DIAGNOSIS — Z9181 History of falling: Secondary | ICD-10-CM

## 2017-11-15 LAB — COMPREHENSIVE METABOLIC PANEL
ALBUMIN: 3.2 g/dL — AB (ref 3.5–5.0)
ALK PHOS: 63 U/L (ref 38–126)
ALT: 21 U/L (ref 14–54)
ANION GAP: 11 (ref 5–15)
AST: 45 U/L — ABNORMAL HIGH (ref 15–41)
BUN: 12 mg/dL (ref 6–20)
CHLORIDE: 102 mmol/L (ref 101–111)
CO2: 21 mmol/L — AB (ref 22–32)
Calcium: 8.2 mg/dL — ABNORMAL LOW (ref 8.9–10.3)
Creatinine, Ser: 0.78 mg/dL (ref 0.44–1.00)
GFR calc non Af Amer: >60 mL/min (ref >60)
Glucose, Bld: 119 mg/dL — ABNORMAL HIGH (ref 65–99)
Potassium: 3 mmol/L — ABNORMAL LOW (ref 3.5–5.1)
SODIUM: 134 mmol/L — AB (ref 135–145)
Total Bilirubin: 2.2 mg/dL — ABNORMAL HIGH (ref 0.3–1.2)
Total Protein: 6.7 g/dL (ref 6.5–8.1)

## 2017-11-15 LAB — CBC
HEMATOCRIT: 38.2 % (ref 35.0–47.0)
HEMOGLOBIN: 13.1 g/dL (ref 12.0–16.0)
MCH: 33.2 pg (ref 26.0–34.0)
MCHC: 34.4 g/dL (ref 32.0–36.0)
MCV: 96.3 fL (ref 80.0–100.0)
Platelets: 204 10*3/uL (ref 150–440)
RBC: 3.96 MIL/uL (ref 3.80–5.20)
RDW: 17.1 % — ABNORMAL HIGH (ref 11.5–14.5)
WBC: 9.6 10*3/uL (ref 3.6–11.0)

## 2017-11-15 LAB — TROPONIN I: Troponin I: 0.12 ng/mL (ref <0.03)

## 2017-11-15 MED ORDER — AZITHROMYCIN 250 MG PO TABS: ORAL_TABLET | ORAL | 0 refills | Status: DC

## 2017-11-15 NOTE — Telephone Encounter (Signed)
Megan from [facility] CT called with a call report:   IMPRESSION: 1. No acute osseous injury of the pelvis. Given the patient's age and osteopenia, if there is persistent clinical concern for an occult hip fracture, a MRI of the hip is recommended for increased sensitivity.  I called Dr. Caryn Section and advised him over the phone of the results. He advised me that patient could go home, and we would be making arrangements for a home health nurse to come out tomorrow to check on patient. Patient was advised of this and was in agreement with plan. I have placed an order for home heath referral. Patient is requesting medication for her cold symptoms.

## 2017-11-15 NOTE — Patient Instructions (Signed)
   Go to [facility] Entrance at [facility] and ask to be directed to outpatient radiology

## 2017-11-15 NOTE — Progress Notes (Signed)
Patient: Tina Salinas Female    DOB: 09-May-1946   72 y.o.   MRN: [id number] Visit Date: 11/15/2017  Today's Provider: Lelon Huh, MD   Chief Complaint  Patient presents with  . Cough  . Fall   Subjective:    Cough  This is a new problem. The current episode started in the past 7 days (4 days ). The problem has been unchanged. The cough is non-productive. Associated symptoms include ear congestion, nasal congestion, rhinorrhea, shortness of breath and wheezing. Pertinent negatives include no chest pain, chills, ear pain, fever, headaches, heartburn, hemoptysis, myalgias, postnasal drip, rash, sore throat, sweats or weight loss. She has tried nothing for the symptoms.   Patient has had a non-productive cough for 4 days. Patient also has symptoms of shortness of breath, wheezing, nasal congestion, and ear congestion. Patient has also been off balance and had fell yesterday. She was unable to get herself off the floor and had to wait several hours for a friend to help her get up. Today she has bruise on back of her head and has pain in her left leg and hip.  Patient states she is feeling weak due to her cold. She usually ambulates with walker but is now unable to even lift herself out of a chair.     Allergies  Allergen Reactions  . Etodolac     Other reaction(s): Unknown  . Sulfa Antibiotics   . Tetracycline      Current Outpatient Medications:  .  acyclovir (ZOVIRAX) 800 MG tablet, Take 1 tablet (800 mg total) by mouth 3 (three) times daily as needed., Disp: 270 tablet, Rfl: 3 .  alendronate (FOSAMAX) 70 MG tablet, Take by mouth once a week., Disp: , Rfl:  .  ALPRAZolam (XANAX) 0.5 MG tablet, TAKE 1/2 TO 1 TABLET AT BEDTIME, Disp: 90 tablet, Rfl: 1 .  aspirin 81 MG tablet, Take 81 mg by mouth daily., Disp: , Rfl:  .  carvedilol (COREG) 3.125 MG tablet, Take 1 tablet by mouth 2 (two) times daily., Disp: , Rfl:  .  cholecalciferol (VITAMIN D) 1000 UNITS tablet, Take by mouth  daily., Disp: , Rfl:  .  cyclobenzaprine (FLEXERIL) 10 MG tablet, Take 1 tablet (10 mg total) by mouth 3 (three) times daily as needed., Disp: 270 tablet, Rfl: 0 .  fluticasone (FLONASE) 50 MCG/ACT nasal spray, Place 2 sprays into both nostrils daily., Disp: 16 g, Rfl: 6 .  folic acid (FOLVITE) 1 MG tablet, Take by mouth daily., Disp: , Rfl:  .  HYDROcodone-acetaminophen (NORCO) 7.5-325 MG tablet, Take 2 tablets by mouth 3 (three) times daily. 3 months supply., Disp: 540 tablet, Rfl: 0 .  hydroxychloroquine (PLAQUENIL) 200 MG tablet, Take by mouth daily., Disp: , Rfl:  .  ibuprofen (ADVIL,MOTRIN) 800 MG tablet, TAKE 1 TABLET EVERY 8 HOURS AS NEEDED  FOR  MODERATE  PAIN, Disp: 270 tablet, Rfl: 3 .  Methotrexate, PF, 20 MG/0.4ML SOAJ, Inject into the skin. Inject 20 mg subcutaneously once a week., Disp: , Rfl:  .  Methylcellulose, Laxative, (GNP FIBER THERAPY PO), Take by mouth., Disp: , Rfl:  .  oxybutynin (DITROPAN) 5 MG tablet, TAKE 1 TABLET TWICE DAILY AS NEEDED  FOR  BLADDER  SPASMS, Disp: 180 tablet, Rfl: 4 .  pravastatin (PRAVACHOL) 40 MG tablet, Take 1 tablet (40 mg total) by mouth at bedtime., Disp: 30 tablet, Rfl: 0 .  predniSONE (DELTASONE) 5 MG tablet, Take 5 mg by mouth  daily with breakfast., Disp: , Rfl:  .  promethazine (PHENERGAN) 25 MG tablet, Take 1 tablet (25 mg total) by mouth every 8 (eight) hours as needed for nausea or vomiting., Disp: 75 tablet, Rfl: 1 .  valACYclovir (VALTREX) 1000 MG tablet, as needed., Disp: , Rfl:   Review of Systems  Constitutional: Negative for appetite change, chills, fatigue, fever and weight loss.  HENT: Positive for rhinorrhea. Negative for ear pain, postnasal drip and sore throat.   Respiratory: Positive for cough, shortness of breath and wheezing. Negative for hemoptysis and chest tightness.   Cardiovascular: Negative for chest pain and palpitations.  Gastrointestinal: Negative for abdominal pain, heartburn, nausea and vomiting.    Musculoskeletal: Negative for myalgias.  Skin: Negative for rash.  Neurological: Negative for dizziness, weakness and headaches.    Social History   Tobacco Use  . Smoking status: Never Smoker  . Smokeless tobacco: Never Used  Substance Use Topics  . Alcohol use: No   Objective:   BP 110/60 (BP Location: Right Arm, Patient Position: Sitting, Cuff Size: Normal)   Pulse 99   Temp 98.4 F (36.9 C) (Oral)   Resp 16   SpO2 95%  Vitals:   11/15/17 1157  BP: 110/60  Pulse: 99  Resp: 16  Temp: 98.4 F (36.9 C)  TempSrc: Oral  SpO2: 95%     Physical Exam   General Appearance:    Alert, cooperative, no distress  HENT:   ENT exam normal, no neck nodes or sinus tenderness  Eyes:    PERRL, conjunctiva/corneas clear, EOM's intact       Lungs:     Occasional expiratory wheeze, no rales,  respirations unlabored  Heart:    Regular rate and rhythm  MS:   Left lower leg red, tender and swollen. She can only stand from chair with assistance and cannot bear full weight on left leg.   Neurologic:   Awake, alert, oriented x 3.           Assessment & Plan:     1. Falls frequently  - DG Tibia/Fibula Left; Future - DG HIP UNILAT WITH PELVIS 2-3 VIEWS LEFT; Future - CT PELVIS WO CONTRAST; Future  2. Upper respiratory tract infection, unspecified type   3. Cough  - DG Chest 2 View; Future  4. Left hip pain  - DG HIP UNILAT WITH PELVIS 2-3 VIEWS LEFT; Future  5. Left leg pain  - DG Tibia/Fibula Left; Future  6. Osteoarthritis of hand, unspecified laterality, unspecified osteoarthritis type   7. Cerebral palsy, unspecified type ([place])  Hip/pelvic xray shows area of possible pelvic fracture and requires further imaging for definitive diagnosis. She is at high risk for pelvic fracture. She is strongly opposed to hospitalization. However if she does have fracture she will need extensive physical therapy and will not be safe to return home as she lives alone. Will obtain CT  of pelvis for definitve diagnosis.    Over half of this complicated 45 minute visit were spent in counseling and coordinating care of multiple medical problems.        Lelon Huh, MD  [facility]

## 2017-11-15 NOTE — ED Notes (Signed)
Pt to room 4 via w/c by EDT [facility] to be placed on card monitor for further eval; report called to care nurse Ebony Hail, RN

## 2017-11-15 NOTE — Telephone Encounter (Signed)
Patient seen 11-15-2017

## 2017-11-15 NOTE — ED Triage Notes (Addendum)
Pt to triage via w/c with no distress noted, brought in by EMS; pt irritable with triage questioning & insisting that she was seen here in the ED today and "they should have kept me"; pt was not seen here but eval by her PCP; pt doesn't not know why or what was told to her; pt st "I'm hoarse"; pt denies pain; pt is accomp by friend who reports pt lives alone and seems confused; pt apparently eval by PCP today and had CXR, hip xray with CT

## 2017-11-15 NOTE — Addendum Note (Signed)
Addended by: Birdie Sons on: 11/15/2017 03:40 PM   Modules accepted: Orders

## 2017-11-16 ENCOUNTER — Inpatient Hospital Stay
Admit: 2017-11-16 | Discharge: 2017-11-16 | Disposition: A | Discharge status: Home / Self Care | Payer: Medicare PPO | Attending: Internal Medicine | Admitting: Internal Medicine

## 2017-11-16 ENCOUNTER — Other Ambulatory Visit: Payer: Self-pay

## 2017-11-16 ENCOUNTER — Encounter: Payer: Self-pay | Admitting: Internal Medicine

## 2017-11-16 ENCOUNTER — Emergency Department: Payer: Medicare PPO

## 2017-11-16 DIAGNOSIS — R410 Disorientation, unspecified: Secondary | ICD-10-CM | POA: Diagnosis present

## 2017-11-16 DIAGNOSIS — Z882 Allergy status to sulfonamides status: Secondary | ICD-10-CM | POA: Diagnosis not present

## 2017-11-16 DIAGNOSIS — J189 Pneumonia, unspecified organism: Secondary | ICD-10-CM | POA: Diagnosis present

## 2017-11-16 DIAGNOSIS — E440 Moderate protein-calorie malnutrition: Secondary | ICD-10-CM | POA: Diagnosis present

## 2017-11-16 DIAGNOSIS — M059 Rheumatoid arthritis with rheumatoid factor, unspecified: Secondary | ICD-10-CM | POA: Diagnosis present

## 2017-11-16 DIAGNOSIS — Z811 Family history of alcohol abuse and dependence: Secondary | ICD-10-CM | POA: Diagnosis not present

## 2017-11-16 DIAGNOSIS — J9811 Atelectasis: Secondary | ICD-10-CM | POA: Diagnosis present

## 2017-11-16 DIAGNOSIS — I2510 Atherosclerotic heart disease of native coronary artery without angina pectoris: Secondary | ICD-10-CM | POA: Diagnosis present

## 2017-11-16 DIAGNOSIS — R778 Other specified abnormalities of plasma proteins: Secondary | ICD-10-CM | POA: Diagnosis present

## 2017-11-16 DIAGNOSIS — Z7982 Long term (current) use of aspirin: Secondary | ICD-10-CM | POA: Diagnosis not present

## 2017-11-16 DIAGNOSIS — I110 Hypertensive heart disease with heart failure: Secondary | ICD-10-CM | POA: Diagnosis present

## 2017-11-16 DIAGNOSIS — R0902 Hypoxemia: Secondary | ICD-10-CM | POA: Diagnosis present

## 2017-11-16 DIAGNOSIS — Z792 Long term (current) use of antibiotics: Secondary | ICD-10-CM | POA: Diagnosis not present

## 2017-11-16 DIAGNOSIS — I5042 Chronic combined systolic (congestive) and diastolic (congestive) heart failure: Secondary | ICD-10-CM | POA: Diagnosis present

## 2017-11-16 DIAGNOSIS — Z955 Presence of coronary angioplasty implant and graft: Secondary | ICD-10-CM | POA: Diagnosis not present

## 2017-11-16 DIAGNOSIS — Z96652 Presence of left artificial knee joint: Secondary | ICD-10-CM | POA: Diagnosis present

## 2017-11-16 DIAGNOSIS — Z881 Allergy status to other antibiotic agents status: Secondary | ICD-10-CM | POA: Diagnosis not present

## 2017-11-16 DIAGNOSIS — I480 Paroxysmal atrial fibrillation: Secondary | ICD-10-CM | POA: Diagnosis present

## 2017-11-16 DIAGNOSIS — Z791 Long term (current) use of non-steroidal anti-inflammatories (NSAID): Secondary | ICD-10-CM | POA: Diagnosis not present

## 2017-11-16 DIAGNOSIS — R269 Unspecified abnormalities of gait and mobility: Secondary | ICD-10-CM | POA: Diagnosis present

## 2017-11-16 DIAGNOSIS — E785 Hyperlipidemia, unspecified: Secondary | ICD-10-CM | POA: Diagnosis present

## 2017-11-16 DIAGNOSIS — I248 Other forms of acute ischemic heart disease: Secondary | ICD-10-CM | POA: Diagnosis present

## 2017-11-16 DIAGNOSIS — I5043 Acute on chronic combined systolic (congestive) and diastolic (congestive) heart failure: Secondary | ICD-10-CM | POA: Diagnosis present

## 2017-11-16 DIAGNOSIS — I4891 Unspecified atrial fibrillation: Secondary | ICD-10-CM | POA: Diagnosis present

## 2017-11-16 DIAGNOSIS — Z888 Allergy status to other drugs, medicaments and biological substances status: Secondary | ICD-10-CM | POA: Diagnosis not present

## 2017-11-16 DIAGNOSIS — R7989 Other specified abnormal findings of blood chemistry: Secondary | ICD-10-CM

## 2017-11-16 DIAGNOSIS — R0989 Other specified symptoms and signs involving the circulatory and respiratory systems: Secondary | ICD-10-CM | POA: Diagnosis present

## 2017-11-16 DIAGNOSIS — E7800 Pure hypercholesterolemia, unspecified: Secondary | ICD-10-CM | POA: Diagnosis present

## 2017-11-16 DIAGNOSIS — Z6820 Body mass index (BMI) 20.0-20.9, adult: Secondary | ICD-10-CM | POA: Diagnosis not present

## 2017-11-16 HISTORY — DX: Pneumonia, unspecified organism: J18.9

## 2017-11-16 LAB — URINALYSIS, COMPLETE (UACMP) WITH MICROSCOPIC
BILIRUBIN URINE: NEGATIVE
GLUCOSE, UA: NEGATIVE mg/dL
Ketones, ur: 5 mg/dL — AB
Leukocytes, UA: NEGATIVE
Nitrite: NEGATIVE
PH: 5 (ref 5.0–8.0)
Protein, ur: 100 mg/dL — AB
SPECIFIC GRAVITY, URINE: 1.021 (ref 1.005–1.030)
Squamous Epithelial / LPF: NONE SEEN (ref 0–5)

## 2017-11-16 LAB — BASIC METABOLIC PANEL
Anion gap: 10 (ref 5–15)
BUN: 11 mg/dL (ref 6–20)
CHLORIDE: 108 mmol/L (ref 101–111)
CO2: 20 mmol/L — ABNORMAL LOW (ref 22–32)
Calcium: 7.2 mg/dL — ABNORMAL LOW (ref 8.9–10.3)
Creatinine, Ser: 0.65 mg/dL (ref 0.44–1.00)
GFR calc Af Amer: >60 mL/min (ref >60)
GLUCOSE: 94 mg/dL (ref 65–99)
POTASSIUM: 2.9 mmol/L — AB (ref 3.5–5.1)
Sodium: 138 mmol/L (ref 135–145)

## 2017-11-16 LAB — PROCALCITONIN: PROCALCITONIN: 1.71 ng/mL

## 2017-11-16 LAB — INFLUENZA PANEL BY PCR (TYPE A & B)
INFLBPCR: NEGATIVE
Influenza A By PCR: NEGATIVE

## 2017-11-16 LAB — CBC
HEMATOCRIT: 34.4 % — AB (ref 35.0–47.0)
Hemoglobin: 11.7 g/dL — ABNORMAL LOW (ref 12.0–16.0)
MCH: 33.2 pg (ref 26.0–34.0)
MCHC: 34.1 g/dL (ref 32.0–36.0)
MCV: 97.3 fL (ref 80.0–100.0)
Platelets: 178 10*3/uL (ref 150–440)
RBC: 3.53 MIL/uL — ABNORMAL LOW (ref 3.80–5.20)
RDW: 16.9 % — AB (ref 11.5–14.5)
WBC: 8.5 10*3/uL (ref 3.6–11.0)

## 2017-11-16 LAB — MAGNESIUM: MAGNESIUM: 1.8 mg/dL (ref 1.7–2.4)

## 2017-11-16 LAB — TROPONIN I
Troponin I: 0.12 ng/mL (ref <0.03)
Troponin I: 0.13 ng/mL (ref <0.03)
Troponin I: 0.12 ng/mL (ref <0.03)
Troponin I: 0.1 ng/mL (ref <0.03)

## 2017-11-16 LAB — ECHOCARDIOGRAM COMPLETE
HEIGHTINCHES: 61 in
Weight: 1736 oz

## 2017-11-16 LAB — POTASSIUM: POTASSIUM: 3.4 mmol/L — AB (ref 3.5–5.1)

## 2017-11-16 LAB — BRAIN NATRIURETIC PEPTIDE: B Natriuretic Peptide: 1015 pg/mL — ABNORMAL HIGH (ref 0.0–100.0)

## 2017-11-16 MED ORDER — AMIODARONE LOAD VIA INFUSION: 150.0000 mg | Freq: Once | INTRAVENOUS | Status: DC

## 2017-11-16 MED ORDER — PREDNISONE 10 MG PO TABS
5.0000 mg | ORAL_TABLET | Freq: Every day | ORAL | Status: DC
  Administered 2017-11-16 – 2017-11-21 (×6): 5 mg via ORAL
  Filled 2017-11-16 (×6): qty 1

## 2017-11-16 MED ORDER — POTASSIUM CHLORIDE CRYS ER 20 MEQ PO TBCR
40.0000 meq | EXTENDED_RELEASE_TABLET | Freq: Once | ORAL | Status: AC
  Administered 2017-11-16: 40 meq via ORAL

## 2017-11-16 MED ORDER — MAGNESIUM SULFATE 2 GM/50ML IV SOLN
2.0000 g | Freq: Once | INTRAVENOUS | Status: AC
  Administered 2017-11-16: 2 g via INTRAVENOUS
  Filled 2017-11-16: qty 50

## 2017-11-16 MED ORDER — POTASSIUM CHLORIDE 20 MEQ PO PACK
40.0000 meq | PACK | Freq: Once | ORAL | Status: DC
  Filled 2017-11-16: qty 2

## 2017-11-16 MED ORDER — SODIUM CHLORIDE 0.9 % IV SOLN: 500.0000 mg | INTRAVENOUS | Status: DC

## 2017-11-16 MED ORDER — POTASSIUM CHLORIDE 20 MEQ PO PACK
40.0000 meq | PACK | Freq: Once | ORAL | Status: AC
  Administered 2017-11-16: 40 meq via ORAL
  Filled 2017-11-16: qty 2

## 2017-11-16 MED ORDER — AMIODARONE HCL IN DEXTROSE 360-4.14 MG/200ML-% IV SOLN
INTRAVENOUS | Status: AC
  Administered 2017-11-16: 150 mg via INTRAVENOUS
  Filled 2017-11-16: qty 200

## 2017-11-16 MED ORDER — AMIODARONE HCL 200 MG PO TABS
400.0000 mg | ORAL_TABLET | Freq: Two times a day (BID) | ORAL | Status: DC
  Administered 2017-11-16 – 2017-11-21 (×10): 400 mg via ORAL
  Filled 2017-11-16 (×10): qty 2

## 2017-11-16 MED ORDER — SODIUM CHLORIDE 0.9 % IV SOLN
1.0000 g | Freq: Once | INTRAVENOUS | Status: AC
  Administered 2017-11-16: 1 g via INTRAVENOUS
  Filled 2017-11-16: qty 10

## 2017-11-16 MED ORDER — ASPIRIN EC 81 MG PO TBEC
81.0000 mg | DELAYED_RELEASE_TABLET | Freq: Every day | ORAL | Status: DC
  Administered 2017-11-16 – 2017-11-21 (×6): 81 mg via ORAL
  Filled 2017-11-16 (×6): qty 1

## 2017-11-16 MED ORDER — AZITHROMYCIN 250 MG PO TABS
500.0000 mg | ORAL_TABLET | Freq: Every day | ORAL | Status: DC
  Administered 2017-11-16 – 2017-11-18 (×3): 500 mg via ORAL
  Filled 2017-11-16 (×3): qty 2

## 2017-11-16 MED ORDER — ONDANSETRON HCL 4 MG PO TABS: 4.0000 mg | ORAL_TABLET | Freq: Four times a day (QID) | ORAL | Status: DC | PRN

## 2017-11-16 MED ORDER — CARVEDILOL 3.125 MG PO TABS
3.1250 mg | ORAL_TABLET | Freq: Two times a day (BID) | ORAL | Status: DC
  Administered 2017-11-16: 3.125 mg via ORAL
  Filled 2017-11-16: qty 1

## 2017-11-16 MED ORDER — SODIUM CHLORIDE 0.9 % IV SOLN
500.0000 mg | Freq: Once | INTRAVENOUS | Status: AC
  Administered 2017-11-16: 500 mg via INTRAVENOUS
  Filled 2017-11-16: qty 500

## 2017-11-16 MED ORDER — ALPRAZOLAM 0.25 MG PO TABS
0.2500 mg | ORAL_TABLET | Freq: Every evening | ORAL | Status: DC | PRN
  Administered 2017-11-19 – 2017-11-20 (×2): 0.25 mg via ORAL
  Filled 2017-11-16 (×2): qty 1

## 2017-11-16 MED ORDER — AMIODARONE LOAD VIA INFUSION
150.0000 mg | Freq: Once | INTRAVENOUS | Status: AC
  Administered 2017-11-16: 150 mg via INTRAVENOUS
  Filled 2017-11-16: qty 83.34

## 2017-11-16 MED ORDER — PRAVASTATIN SODIUM 40 MG PO TABS
40.0000 mg | ORAL_TABLET | Freq: Every day | ORAL | Status: DC
  Administered 2017-11-16 – 2017-11-20 (×5): 40 mg via ORAL
  Filled 2017-11-16 (×5): qty 1

## 2017-11-16 MED ORDER — AMIODARONE HCL IN DEXTROSE 360-4.14 MG/200ML-% IV SOLN
30.0000 mg/h | INTRAVENOUS | Status: DC
  Administered 2017-11-16: 30 mg/h via INTRAVENOUS

## 2017-11-16 MED ORDER — ONDANSETRON HCL 4 MG/2ML IJ SOLN: 4.0000 mg | Freq: Four times a day (QID) | INTRAMUSCULAR | Status: DC | PRN

## 2017-11-16 MED ORDER — HYDROXYCHLOROQUINE SULFATE 200 MG PO TABS
200.0000 mg | ORAL_TABLET | Freq: Every day | ORAL | Status: DC
  Administered 2017-11-16 – 2017-11-21 (×6): 200 mg via ORAL
  Filled 2017-11-16 (×6): qty 1

## 2017-11-16 MED ORDER — DIGOXIN 0.25 MG/ML IJ SOLN
0.2500 mg | Freq: Once | INTRAMUSCULAR | Status: AC
  Administered 2017-11-16: 0.25 mg via INTRAVENOUS
  Filled 2017-11-16: qty 2

## 2017-11-16 MED ORDER — ACETAMINOPHEN 650 MG RE SUPP: 650.0000 mg | Freq: Four times a day (QID) | RECTAL | Status: DC | PRN

## 2017-11-16 MED ORDER — SODIUM CHLORIDE 0.9 % IV BOLUS: 500.0000 mL | Freq: Once | INTRAVENOUS | Status: DC

## 2017-11-16 MED ORDER — AMIODARONE HCL IN DEXTROSE 360-4.14 MG/200ML-% IV SOLN
60.0000 mg/h | INTRAVENOUS | Status: AC
  Administered 2017-11-16: 60 mg/h via INTRAVENOUS
  Filled 2017-11-16: qty 200

## 2017-11-16 MED ORDER — DIGOXIN 0.25 MG/ML IJ SOLN
0.2500 mg | Freq: Once | INTRAMUSCULAR | Status: DC
  Filled 2017-11-16 (×2): qty 1

## 2017-11-16 MED ORDER — DIGOXIN 0.25 MG/ML IJ SOLN
125.0000 ug | Freq: Every day | INTRAMUSCULAR | Status: DC
  Administered 2017-11-16: 125 ug via INTRAVENOUS
  Filled 2017-11-16 (×3): qty 0.5

## 2017-11-16 MED ORDER — CEFTRIAXONE SODIUM 1 G IJ SOLR
1.0000 g | INTRAMUSCULAR | Status: DC
Start: 2017-11-17 — End: 2017-11-19
  Administered 2017-11-17 – 2017-11-19 (×3): 1 g via INTRAVENOUS
  Filled 2017-11-16 (×3): qty 10

## 2017-11-16 MED ORDER — ADULT MULTIVITAMIN W/MINERALS CH
1.0000 | ORAL_TABLET | Freq: Every day | ORAL | Status: DC
  Administered 2017-11-16 – 2017-11-21 (×6): 1 via ORAL
  Filled 2017-11-16 (×6): qty 1

## 2017-11-16 MED ORDER — GUAIFENESIN-DM 100-10 MG/5ML PO SYRP
5.0000 mL | ORAL_SOLUTION | ORAL | Status: DC | PRN
  Administered 2017-11-16 – 2017-11-17 (×3): 5 mL via ORAL
  Filled 2017-11-16 (×3): qty 5

## 2017-11-16 MED ORDER — ACETAMINOPHEN 325 MG PO TABS: 650.0000 mg | ORAL_TABLET | Freq: Four times a day (QID) | ORAL | Status: DC | PRN

## 2017-11-16 MED ORDER — ENOXAPARIN SODIUM 40 MG/0.4ML ~~LOC~~ SOLN
40.0000 mg | SUBCUTANEOUS | Status: DC
  Administered 2017-11-16 – 2017-11-20 (×5): 40 mg via SUBCUTANEOUS
  Filled 2017-11-16 (×5): qty 0.4

## 2017-11-16 MED ORDER — SODIUM CHLORIDE 0.9 % IV BOLUS
1000.0000 mL | Freq: Once | INTRAVENOUS | Status: AC
  Administered 2017-11-16: 1000 mL via INTRAVENOUS

## 2017-11-16 MED ORDER — AMIODARONE HCL IN DEXTROSE 360-4.14 MG/200ML-% IV SOLN
INTRAVENOUS | Status: AC
  Filled 2017-11-16: qty 200

## 2017-11-16 NOTE — Progress Notes (Signed)
Pharmacy Antibiotic Note  Tina Salinas is a 72 y.o. female admitted on 11/15/2017 with pneumonia.  Pharmacy has been consulted for ceftriaxone dosing.  Plan: Ceftriaxone 1 gram daily ordered.  Height: 5\' 1"  (154.9 cm) Weight: 107 lb (48.5 kg) IBW/kg (Calculated) : 47.8  Temp (24hrs), Avg:98.9 F (37.2 C), Min:98.4 F (36.9 C), Max:99.6 F (37.6 C)  Recent Labs  Lab 11/15/17 2243  WBC 9.6  CREATININE 0.78    Estimated Creatinine Clearance: 49.4 mL/min (by C-G formula based on SCr of 0.78 mg/dL).    Allergies  Allergen Reactions  . Etodolac     Other reaction(s): Unknown  . Sulfa Antibiotics   . Tetracycline     Antimicrobials this admission: Azithromycin, ceftriaxone 5/3  >>    >>  Dose adjustments this admission:   Microbiology results: No micro  Thank you for allowing pharmacy to be a part of this patient's care.  Terrance Lanahan S 11/16/2017 5:19 AM

## 2017-11-16 NOTE — H&P (Signed)
[facility] [place] NAME: Tina Salinas    MR#:  [id number]  DATE OF BIRTH:  June 29, 1946  DATE OF ADMISSION:  11/15/2017  PRIMARY CARE PHYSICIAN: Birdie Sons, MD   REQUESTING/REFERRING PHYSICIAN: Joni Fears, MD  CHIEF COMPLAINT:   Chief Complaint  Patient presents with  . Altered Mental Status    HISTORY OF PRESENT ILLNESS:  Tina Salinas  is a 72 y.o. female who presents with cough, shortness of breath, malaise.  Patient states she has had some sputum production, yellow in color.  Here in the ED work-up is consistent with heart failure exacerbation, and potentially community acquired pneumonia.  Hospitalist were called for admission  PAST MEDICAL HISTORY:   Past Medical History:  Diagnosis Date  . CAD (coronary artery disease)   . Chronic combined systolic and diastolic CHF (congestive heart failure) ([place])   . HTN (hypertension)   . Hyperlipidemia      PAST SURGICAL HISTORY:   Past Surgical History:  Procedure Laterality Date  . BREAST ENHANCEMENT SURGERY Bilateral 1980  . CORONARY ANGIOPLASTY WITH STENT PLACEMENT  07/13/2008, 08/19/2008  . HIP ARTHROPLASTY N/A 2000  . LEFT HEART CATH AND CORONARY ANGIOGRAPHY N/A 05/16/2017   Procedure: LEFT HEART CATH AND CORONARY ANGIOGRAPHY;  Surgeon: Teodoro Spray, MD;  Location: [facility];  Service: Cardiovascular;  Laterality: N/A;  . TOTAL KNEE ARTHROPLASTY Left 09/2007, 01/2008     SOCIAL HISTORY:   Social History   Tobacco Use  . Smoking status: Never Smoker  . Smokeless tobacco: Never Used  Substance Use Topics  . Alcohol use: No     FAMILY HISTORY:   Family History  Problem Relation Age of Onset  . Alcohol abuse Father   . Raynaud syndrome Sister      DRUG ALLERGIES:   Allergies  Allergen Reactions  . Etodolac     Other reaction(s): Unknown  . Sulfa Antibiotics   . Tetracycline     MEDICATIONS AT HOME:   Prior to Admission medications    Medication Sig Start Date End Date Taking? Authorizing Provider  acyclovir (ZOVIRAX) 800 MG tablet Take 1 tablet (800 mg total) by mouth 3 (three) times daily as needed. 11/09/16   Birdie Sons, MD  alendronate (FOSAMAX) 70 MG tablet Take by mouth once a week. 03/30/14   [provider]  ALPRAZolam Duanne Moron) 0.5 MG tablet TAKE 1/2 TO 1 TABLET AT BEDTIME 09/11/17   Birdie Sons, MD  aspirin 81 MG tablet Take 81 mg by mouth daily.    [provider]  azithromycin (ZITHROMAX) 250 MG tablet 2 by mouth today, then 1 daily for 4 days 11/15/17 11/20/17  Birdie Sons, MD  carvedilol (COREG) 3.125 MG tablet Take 1 tablet by mouth 2 (two) times daily. 05/28/17 05/28/18  [provider]  cholecalciferol (VITAMIN D) 1000 UNITS tablet Take by mouth daily. 04/25/10   [provider]  cyclobenzaprine (FLEXERIL) 10 MG tablet Take 1 tablet (10 mg total) by mouth 3 (three) times daily as needed. 09/11/17   Birdie Sons, MD  fluticasone (FLONASE) 50 MCG/ACT nasal spray Place 2 sprays into both nostrils daily. 07/13/17   Birdie Sons, MD  folic acid (FOLVITE) 1 MG tablet Take by mouth daily. 10/31/13   [provider]  HYDROcodone-acetaminophen (NORCO) 7.5-325 MG tablet Take 2 tablets by mouth 3 (three) times daily. 3 months supply. 09/11/17   Birdie Sons, MD  hydroxychloroquine (  PLAQUENIL) 200 MG tablet Take by mouth daily.    [provider]  ibuprofen (ADVIL,MOTRIN) 800 MG tablet TAKE 1 TABLET EVERY 8 HOURS AS NEEDED  FOR  MODERATE  PAIN 05/29/17   Birdie Sons, MD  Methotrexate, PF, 20 MG/0.4ML SOAJ Inject 20 mg into the skin once a week. Inject 20 mg subcutaneously once a week. 05/10/16   [provider]  Methylcellulose, Laxative, (GNP FIBER THERAPY PO) Take by mouth.    [provider]  oxybutynin (DITROPAN) 5 MG tablet TAKE 1 TABLET TWICE DAILY AS NEEDED  FOR  BLADDER  SPASMS 05/31/17   Birdie Sons, MD  pravastatin  (PRAVACHOL) 40 MG tablet Take 1 tablet (40 mg total) by mouth at bedtime. 06/01/16   Birdie Sons, MD  predniSONE (DELTASONE) 5 MG tablet Take 5 mg by mouth daily with breakfast.    [provider]  promethazine (PHENERGAN) 25 MG tablet Take 1 tablet (25 mg total) by mouth every 8 (eight) hours as needed for nausea or vomiting. 09/11/17   Birdie Sons, MD  valACYclovir (VALTREX) 1000 MG tablet as needed. 06/03/09   [provider]    REVIEW OF SYSTEMS:  Review of Systems  Constitutional: Negative for chills, fever, malaise/fatigue and weight loss.  HENT: Negative for ear pain, hearing loss and tinnitus.   Eyes: Negative for blurred vision, double vision, pain and redness.  Respiratory: Positive for cough, sputum production and shortness of breath. Negative for hemoptysis.   Cardiovascular: Negative for chest pain, palpitations, orthopnea and leg swelling.  Gastrointestinal: Negative for abdominal pain, constipation, diarrhea, nausea and vomiting.  Genitourinary: Negative for dysuria, frequency and hematuria.  Musculoskeletal: Negative for back pain, joint pain and neck pain.  Skin:       No acne, rash, or lesions  Neurological: Negative for dizziness, tremors, focal weakness and weakness.  Endo/Heme/Allergies: Negative for polydipsia. Does not bruise/bleed easily.  Psychiatric/Behavioral: Negative for depression. The patient is not nervous/anxious and does not have insomnia.      VITAL SIGNS:   Vitals:   11/16/17 0052 11/16/17 0109 11/16/17 0130 11/16/17 0200  BP:   (!) 106/59 110/64  Pulse: (!) 101  (!) 106 98  Resp: 19  (!) 25 19  Temp:  98.8 F (37.1 C)    TempSrc:  Oral    SpO2: 94%  96% 93%  Weight:      Height:       Wt Readings from Last 3 Encounters:  11/15/17 48.5 kg (107 lb)  09/11/17 54.3 kg (119 lb 9.6 oz)  07/13/17 53.1 kg (117 lb)    PHYSICAL EXAMINATION:  Physical Exam  Vitals reviewed. Constitutional: She is oriented to person,  place, and time. She appears well-developed and well-nourished. No distress.  HENT:  Head: Normocephalic and atraumatic.  Mouth/Throat: Oropharynx is clear and moist.  Eyes: Pupils are equal, round, and reactive to light. Conjunctivae and EOM are normal. No scleral icterus.  Neck: Normal range of motion. Neck supple. No JVD present. No thyromegaly present.  Cardiovascular: Normal rate, regular rhythm and intact distal pulses. Exam reveals no gallop and no friction rub.  No murmur heard. Respiratory: Effort normal. No respiratory distress. She has no wheezes. She has no rales.  coarse breath sounds  GI: Soft. Bowel sounds are normal. She exhibits no distension. There is no tenderness.  Musculoskeletal: Normal range of motion. She exhibits no edema.  No arthritis, no gout  Lymphadenopathy:    She has no  cervical adenopathy.  Neurological: She is alert and oriented to person, place, and time. No cranial nerve deficit.  No dysarthria, no aphasia  Skin: Skin is warm and dry. No rash noted. No erythema.  Psychiatric: She has a normal mood and affect. Her behavior is normal. Judgment and thought content normal.    LABORATORY PANEL:   CBC Recent Labs  Lab 11/15/17 2243  WBC 9.6  HGB 13.1  HCT 38.2  PLT 204   ------------------------------------------------------------------------------------------------------------------  Chemistries  Recent Labs  Lab 11/15/17 2243  NA 134*  K 3.0*  CL 102  CO2 21*  GLUCOSE 119*  BUN 12  CREATININE 0.78  CALCIUM 8.2*  AST 45*  ALT 21  ALKPHOS 63  BILITOT 2.2*   ------------------------------------------------------------------------------------------------------------------  Cardiac Enzymes Recent Labs  Lab 11/15/17 2243  TROPONINI 0.12*   ------------------------------------------------------------------------------------------------------------------  RADIOLOGY:  Dg Chest 2 View  Result Date: 11/16/2017 CLINICAL DATA:   72 year old female with cough and shortness of breath. EXAM: CHEST - 2 VIEW COMPARISON:  Chest radiograph dated 11/15/2017 FINDINGS: There is no focal consolidation, pleural effusion, or pneumothorax. There is moderate cardiomegaly. Coronary vascular stent and calcified mitral annulus noted. There is osteopenia with degenerative changes of the spine. No acute osseous pathology. IMPRESSION: No acute cardiopulmonary process.  Cardiomegaly. Electronically Signed   By: Anner Crete M.D.   On: 11/16/2017 01:01   Dg Chest 2 View  Result Date: 11/15/2017 CLINICAL DATA:  Nonproductive cough. EXAM: CHEST - 2 VIEW COMPARISON:  11/29/2009. FINDINGS: Mediastinum hilar structures normal. Mild right base subsegmental atelectasis. No pleural effusion or pneumothorax. Coronary stents. No acute bony abnormality. Degenerative changes both shoulders. Degenerative changes thoracic spine. IMPRESSION: 1.  Cardiomegaly.  No pulmonary venous congestion.  Coronary stents. 2.  Low lung volumes with mild right base subsegmental atelectasis. Electronically Signed   By: Marcello Moores  Register   On: 11/15/2017 13:36   Dg Tibia/fibula Left  Result Date: 11/15/2017 CLINICAL DATA:  Pain following fall EXAM: LEFT TIBIA AND FIBULA - 2 VIEW COMPARISON:  None. FINDINGS: Frontal and lateral views were obtained. Bones are osteoporotic. There is an old healed fracture of the distal fibular diaphysis with alignment near anatomic. There is postoperative change in the talus region. There is a total knee replacement with prosthetic components well-seated. No acute fracture or dislocation. No abnormal periosteal reaction. There are phleboliths at several sites in the lower extremity. IMPRESSION: No acute fracture or dislocation. Bones osteoporotic. Old healed fracture with remodeling distal fibula. Postoperative change in the talus noted as well as total knee replacement. Electronically Signed   By: Lowella Grip III M.D.   On: 11/15/2017 13:36   Ct  Pelvis Wo Contrast  Result Date: 11/15/2017 CLINICAL DATA:  Status post fall. Pelvic fracture suspected. Fell 2 weeks ago. EXAM: CT PELVIS WITHOUT CONTRAST TECHNIQUE: Multidetector CT imaging of the pelvis was performed following the standard protocol without intravenous contrast. COMPARISON:  None. FINDINGS: Bones/Joint/Cartilage Generalized osteopenia. No acute fracture or dislocation. Old right hip fracture status post ORIF. Normal alignment. No joint effusion. Mild osteoarthritis of sacroiliac joints. Ligaments Ligaments are suboptimally evaluated by CT. Muscles and Tendons Muscles are normal Soft tissue No fluid collection or hematoma.  No soft tissue mass. IMPRESSION: 1. No acute osseous injury of the pelvis. Given the patient's age and osteopenia, if there is persistent clinical concern for an occult hip fracture, a MRI of the hip is recommended for increased sensitivity. Electronically Signed   By: Kathreen Devoid   On: 11/15/2017  15:02   Dg Hip Unilat With Pelvis 2-3 Views Left  Result Date: 11/15/2017 CLINICAL DATA:  Left hip pain and weakness. EXAM: DG HIP (WITH OR WITHOUT PELVIS) 2-3V LEFT COMPARISON:  None. FINDINGS: Bones are diffusely demineralized. Patient is status post ORIF of the right acetabulum and proximal right femur. Right femoral hardware incompletely visualized. SI joints and symphysis pubis unremarkable. AP and frog-leg lateral views of the left hip show no evidence for femoral neck fracture. Focus of cortical irregularity identified along the posterior aspect of the left inferior pubic ramus with subtle linear lucency in the anterior aspect of the left pubic bone. IMPRESSION: 1. No evidence for left femoral neck fracture. 2. Subtle changes in the left inferior pubic bone and posterior left inferior pubic ramus raising the question of nondisplaced fracture. Although not definite, if the patient has signs/symptoms referable to this region, Judet views of the pelvis may prove helpful to  further evaluate. Electronically Signed   By: Misty Stanley M.D.   On: 11/15/2017 14:06    EKG:   Orders placed or performed during the hospital encounter of 11/15/17  . ED EKG  . ED EKG    IMPRESSION AND PLAN:  Principal Problem:   Acute on chronic combined systolic and diastolic CHF (congestive heart failure) ([place]) -patient has significant elevated BNP as well as elevated troponin.  She has a history of severe heart failure with an EF of 20% and grade 3-4 diastolic dysfunction.  Her blood pressure is on the low end tonight, so we are unable to give her much diuretic.  However, I suspect that her heart failure exacerbation is also somewhat due to her infection, see below for treatment.  We will trend her cardiac enzymes tonight and get an echocardiogram and a cardiology consult. Active Problems:   CAP (community acquired pneumonia) -IV antibiotics given, imaging did not show focal pneumonia, though on my read of her x-ray there is potentially some right basilar infiltrate, is consistent with physical exam and coarse breath sounds.  Continue IV antibiotics, other supportive treatment PRN   Essential (primary) hypertension -blood pressure on the low end, hold antihypertensives for now   Rheumatoid arthritis with rheumatoid factor ([place]) -continue home meds   Elevated troponin -cycle enzymes, other work-up as above   Hypercholesteremia -Home dose antilipid  Chart review performed and case discussed with ED provider. Labs, imaging and/or ECG reviewed by provider and discussed with patient/family. Management plans discussed with the patient and/or family.  DVT PROPHYLAXIS: SubQ lovenox  GI PROPHYLAXIS: None  ADMISSION STATUS: Observation  CODE STATUS: Full Code Status History    Date Active Date Inactive Code Status Order ID Comments User Context   05/16/2017 0938 05/16/2017 1504 Full Code [id number]  Teodoro Spray, MD Inpatient      TOTAL TIME TAKING CARE OF THIS PATIENT: 40  minutes.   Jannifer Franklin, Courtenay Creger FIELDING 11/16/2017, 3:01 AM  [employer]  [id number]  CC: Primary care physician; Birdie Sons, MD  Note:  This document was prepared using Dragon voice recognition software and may include unintentional dictation errors.

## 2017-11-16 NOTE — Progress Notes (Signed)
Pharmacy Antibiotic Note  Tina Salinas is a 72 y.o. female admitted on 11/15/2017 with pneumonia.  Pharmacy has been consulted for ceftriaxone dosing.  Plan: Continue ceftriaxone 1 gram daily  Pharmacy to sign off  Height: 5\' 1"  (154.9 cm) Weight: 108 lb 8 oz (49.2 kg) IBW/kg (Calculated) : 47.8  Temp (24hrs), Avg:98.7 F (37.1 C), Min:97.5 F (36.4 C), Max:99.6 F (37.6 C)  Recent Labs  Lab 11/15/17 2243 11/16/17 0516  WBC 9.6 8.5  CREATININE 0.78 0.65    Estimated Creatinine Clearance: 49.4 mL/min (by C-G formula based on SCr of 0.65 mg/dL).    Allergies  Allergen Reactions  . Etodolac     Other reaction(s): Unknown  . Sulfa Antibiotics   . Tetracycline     Antimicrobials this admission: Azithromycin, ceftriaxone 5/3  >>    >>  Dose adjustments this admission:   Microbiology results: No micro  Thank you for allowing pharmacy to be a part of this patient's care.  Ramond Dial 11/16/2017 2:57 PM

## 2017-11-16 NOTE — Progress Notes (Signed)
Principal Problem: *Acute on chronic combined systolic and diastolic CHF (congestive heart failure) ([facility]) -patient has significant elevated BNP as well as elevated troponin.  She has a history of severe heart failure with an EF of 20% and grade 3-4 diastolic dysfunction.  Her blood pressure is on the low end tonight, so we are unable to give her much diuretic.  However, I suspect that her heart failure exacerbation is also somewhat due to her infection, see below for treatment.  We will trend her cardiac enzymes tonight and get an echocardiogram and a cardiology consult. Active Problems:  *  CAP (community acquired pneumonia) -IV antibiotics given, imaging did not show focal pneumonia, though on my read of her x-ray there is potentially some right basilar infiltrate, is consistent with physical exam and coarse breath sounds.  Continue IV antibiotics, other supportive treatment PRN   Essential (primary) hypertension -blood pressure on the low end, hold antihypertensives for now   Rheumatoid arthritis with rheumatoid factor (HCC) -continue home meds   Elevated troponin -cycle enzymes, other work-up as above   Hypercholesteremia -Home dose antilipid  *Acute A. fib with RVR, newly diagnosed Rapid response call, Dr. fast/cardiology to see patient while in house-patient improved on amiodarone drip Rule out acute coronary syndrome with cardiac enzymes, supplement oxygen as needed, replete potassium, check magnesium level, check potassium level at 2 PM, follow-up on echocardiogram, and await further cardiology recommendations, change to admit status   Disposition pending clinical course  Full code

## 2017-11-16 NOTE — Progress Notes (Signed)
Chaplain responded to Rapid Response and offered energetic prayer to patient and care team.  No family were present requiring care and the patient was awake and alert. Chaplain closed response with a prayer for staff skill and discernment.

## 2017-11-16 NOTE — Care Management (Signed)
Attempted to have observation notice signed and a rapid response being called for elevated heart rate.  Notified UR so can monitor for admission

## 2017-11-16 NOTE — Plan of Care (Signed)
Patient's HR maintaining from 140s to 180s without resolving.  Patinet asymptomatic on assessment. Do Fath(Cardio) paged to inform.

## 2017-11-16 NOTE — Progress Notes (Signed)
Just received pt from ED, pt with productive cough with yellow/green phlegm, low grade fever. MD paged to see if flu swab needed, Dr. Jannifer [place] gave orders to swab pt for the flu. Isolation placed. Will continue to monitor. Conley Simmonds, RN, BSN

## 2017-11-16 NOTE — Progress Notes (Signed)
Page prime to inform pt. Has converted to SR with HR in 60 and seventies and request amiodarone gtt be discontinued. Dr. Vianne Bulls returned page and said to call cardiologist. Will page Dr. Ubaldo Glassing.

## 2017-11-16 NOTE — Progress Notes (Signed)
*  PRELIMINARY RESULTS* Echocardiogram 2D Echocardiogram has been performed.  Tina Salinas [name] 11/16/2017, 2:58 PM

## 2017-11-16 NOTE — Progress Notes (Signed)
Initial Nutrition Assessment  DOCUMENTATION CODES:   Non-severe (moderate) malnutrition in context of acute illness/injury  INTERVENTION:   Magic cup TID with meals, each supplement provides 290 kcal and 9 grams of protein  MVI daily  Liberalize diet   Vital Cuisine TID, each supplement provides 520kcal and 22g of protein.   Pt is likely at moderate refeeding risk; recommend monitor K, Mg, and P labs.    NUTRITION DIAGNOSIS:   Moderate Malnutrition related to acute illness(CAP, CHF) as evidenced by energy intake < 75% for > 7 days, mild fat depletion, mild muscle depletion, 9 percent weight loss in 2 months.  GOAL:   Patient will meet greater than or equal to 90% of their needs  MONITOR:   PO intake, Supplement acceptance, Labs, Weight trends, Skin, I & O's  REASON FOR ASSESSMENT:   Malnutrition Screening Tool    ASSESSMENT:   72 year old female with nonischemic cardiomyopathy and EF of 20 to 25% admitted with shortness of breath and noted to have developed atrial fibrillation with rapid ventricular response.   Met with pt in room today. Pt reports poor appetite and oral intake for the past 1-2 weeks. Pt reports that her appetite is still poor today. Pt does not like supplements. Pt does love vanilla and orange ice cream; RD will add Magic Cups. RD will also liberalize pt's diet. Per chart, pt has lost 11lbs(9%) over the past 2 months; this is significant given the time frame. Pt is likely at moderate refeeding risk; recommend monitor K, Mg, and P labs.    Medications reviewed and include: aspirin, lovenox, KCl, prednisone, azithromycin, ceftriaxone   Labs reviewed: K 2.9(L), Ca 7.2(L) BNP- 1015(H)- 5/2  NUTRITION - FOCUSED PHYSICAL EXAM:    Most Recent Value  Orbital Region  Mild depletion  Upper Arm Region  Mild depletion  Thoracic and Lumbar Region  Mild depletion  Buccal Region  Mild depletion  Temple Region  No depletion  Clavicle Bone Region  Mild depletion   Clavicle and Acromion Bone Region  Mild depletion  Scapular Bone Region  Mild depletion  Dorsal Hand  Moderate depletion  Patellar Region  Mild depletion  Anterior Thigh Region  Mild depletion  Posterior Calf Region  Mild depletion  Edema (RD Assessment)  Mild  Hair  Reviewed  Eyes  Reviewed  Mouth  Reviewed  Skin  Reviewed  Nails  Reviewed     Diet Order:   Diet Order           Diet Heart Room service appropriate? Yes; Fluid consistency: Thin  Diet effective now         EDUCATION NEEDS:   Education needs have been addressed  Skin:  Skin Assessment: Reviewed RN Assessment(Cellulitis BLE)  Last BM:  pta  Height:   Ht Readings from Last 1 Encounters:  11/16/17 5' 1"  (1.549 m)    Weight:   Wt Readings from Last 1 Encounters:  11/16/17 108 lb 8 oz (49.2 kg)    Ideal Body Weight:  47.7 kg  BMI:  Body mass index is 20.5 kg/m.  Estimated Nutritional Needs:   Kcal:  1200-1400kcal/day   Protein:  74-84g/day   Fluid:  per MD  Koleen Distance MS, RD, LDN Pager #[phone] After Hours Pager: [phone]

## 2017-11-16 NOTE — Plan of Care (Addendum)
Patient informed me that she has had a throbbing pain in her Rt carotid for about 15-20 minutes. D/t her continued high HR a Rapid was called. STAT EKG completed.  Dr. Ubaldo Glassing re-paged and returned call. Gave verbal orders to give 180mg  Amiodarone bolus and then start on drip. Picture taken of EKG results and sent to Dr. Ubaldo Glassing phone. Will round when able.  Patient remains asymptomatic except continued Rt carotid pressure and pain.  Caryl Pina, RN resuming care to administer drips as ordered. Patient informed of change in care. Hosp informed of Rapid Response.

## 2017-11-16 NOTE — Care Management Obs Status (Signed)
[facility] NOTIFICATION   Patient Details  Name: KASEY HANSELL MRN: [id number] Date of Birth: 06/25/1946   Medicare Observation Status Notification Given:  No  Discharge order placed in < 24hr of being placed on observation   Katrina Stack, RN 11/16/2017, 3:19 PM

## 2017-11-16 NOTE — Progress Notes (Signed)
Dr. Ubaldo Glassing returned page, D/C amiodarone gtt and start pt. On amiodarone p.o. 400mg , BID starting at 2200.

## 2017-11-16 NOTE — Progress Notes (Signed)
PHARMACIST - PHYSICIAN COMMUNICATION DR:   Salary CONCERNING: Antibiotic IV to Oral Route Change Policy  RECOMMENDATION: This patient is receiving azithromycin by the intravenous route.  Based on criteria approved by the Pharmacy and Therapeutics Committee, the antibiotic(s) is/are being converted to the equivalent oral dose form(s).   DESCRIPTION: These criteria include:  Patient being treated for a respiratory tract infection, urinary tract infection, cellulitis or clostridium difficile associated diarrhea if on metronidazole  The patient is not neutropenic and does not exhibit a GI malabsorption state  The patient is eating (either orally or via tube) and/or has been taking other orally administered medications for a least 24 hours  The patient is improving clinically and has a Tmax < 100.5  If you have questions about this conversion, please contact the Pharmacy Department  []  ( [phone] )  [place] [x]  ( [phone] )  [facility] []  ( [phone] )  [place] []  ( [phone] )  [facility] []  ( [phone] )  [facility]   
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
## 2017-11-16 NOTE — ED Notes (Addendum)
Pt assisted to BR (x2 assist) to attempt to obtain urine specimen; pt with strong odor of urine, generalized weakness noted upon standing; pt unable to void at this time; pt assisted back into w/c with warm blanket; charge nurse made award of pt's weakness; pt to lobby accomp by friend with instructions not to get up unassisted
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
## 2017-11-16 NOTE — Progress Notes (Addendum)
Noted that patient had not had much output today so a bladder scan was preformed and showed 545. Notified Dr. Jerelyn Charles orders received to place a foley catheter due to patient being on a drip.

## 2017-11-16 NOTE — ED Notes (Signed)
Water provided, oral swabs and mouth moisturizer.

## 2017-11-16 NOTE — Consult Note (Signed)
Cardiology Consultation Note    Patient ID: Tina Salinas, MRN: [id number], DOB/AGE: 70-Jul-1947 70 y.o. Admit date: 11/15/2017   Date of Consult: 11/16/2017 Primary Physician: Birdie Sons, MD Primary Cardiologist: Ubaldo Glassing  Chief Complaint: chf/sob Reason for Consultation: afib with rvr Requesting MD: Dr. Jerelyn Charles  HPI: Tina Salinas is a 72 y.o. 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  She was not felt to have critical coronary artery disease.  She was treated medically.  She states she was noncompliant with her medications for approximately 1 week prior to presentation.  She presented to the emergency room complaining of shortness of breath and fatigue.  She has a history of chronic lower extremity lymphedema.  She presented to the emergency room with complaints of shortness of breath cough and sputum production of yellow sputum.  Chest x-ray revealed no acute cardiopulmonary problems.  There was no pulmonary edema.  There is moderate cardiomegaly.  EKG revealed probable sinus tachycardia with PVCs.  She developed atrial fibrillation with rapid ventricular response today with resultant drop in her blood pressure.  She was given IV amiodarone bolus and drip with further drop in her blood pressure.  She was given a mild fluid bolus with improvement.  She remains somewhat hypertensive and tachycardic but is able to respond appropriately to questions and given.  She was on carvedilol 3.125 mg twice daily as an outpatient.  She is also on pravastatin.  She appears to have ruled out for myocardial infarction with minimal troponin elevation no significant troponin elevation.  Past Medical History:  Diagnosis Date  . CAD (coronary artery disease)   . Chronic  combined systolic and diastolic CHF (congestive heart failure) ([place])   . HTN (hypertension)   . Hyperlipidemia       Surgical History:  Past Surgical History:  Procedure Laterality Date  . BREAST ENHANCEMENT SURGERY Bilateral 1980  . CORONARY ANGIOPLASTY WITH STENT PLACEMENT  07/13/2008, 08/19/2008  . HIP ARTHROPLASTY N/A 2000  . LEFT HEART CATH AND CORONARY ANGIOGRAPHY N/A 05/16/2017   Procedure: LEFT HEART CATH AND CORONARY ANGIOGRAPHY;  Surgeon: Teodoro Spray, MD;  Location: [facility];  Service: Cardiovascular;  Laterality: N/A;  . TOTAL KNEE ARTHROPLASTY Left 09/2007, 01/2008     Home Meds: Prior to Admission medications   Medication Sig Start Date End Date Taking? Authorizing Provider  acyclovir (ZOVIRAX) 800 MG tablet Take 1 tablet (800 mg total) by mouth 3 (three) times daily as needed. 11/09/16   Birdie Sons, MD  alendronate (FOSAMAX) 70 MG tablet Take by mouth once a week. 03/30/14   [provider]  ALPRAZolam Duanne Moron) 0.5 MG tablet TAKE 1/2 TO 1 TABLET AT BEDTIME 09/11/17   Birdie Sons, MD  aspirin 81 MG tablet Take 81 mg by mouth daily.    [provider]  azithromycin (ZITHROMAX) 250 MG tablet 2 by mouth today, then 1 daily for 4 days 11/15/17 11/20/17  Birdie Sons, MD  carvedilol (COREG) 3.125 MG tablet Take 1 tablet by mouth 2 (two) times daily. 05/28/17 05/28/18  [provider]  cholecalciferol (VITAMIN D) 1000 UNITS tablet Take by mouth daily. 04/25/10   [provider]  cyclobenzaprine (FLEXERIL) 10 MG tablet Take 1 tablet (10  mg total) by mouth 3 (three) times daily as needed. 09/11/17   Birdie Sons, MD  fluticasone (FLONASE) 50 MCG/ACT nasal spray Place 2 sprays into both nostrils daily. 07/13/17   Birdie Sons, MD  folic acid (FOLVITE) 1 MG tablet Take by mouth daily. 10/31/13   [provider]  HYDROcodone-acetaminophen (NORCO) 7.5-325 MG tablet Take 2 tablets by mouth 3 (three) times daily. 3  months supply. 09/11/17   Birdie Sons, MD  hydroxychloroquine (PLAQUENIL) 200 MG tablet Take by mouth daily.    [provider]  ibuprofen (ADVIL,MOTRIN) 800 MG tablet TAKE 1 TABLET EVERY 8 HOURS AS NEEDED  FOR  MODERATE  PAIN 05/29/17   Birdie Sons, MD  Methotrexate, PF, 20 MG/0.4ML SOAJ Inject 20 mg into the skin once a week. Inject 20 mg subcutaneously once a week. 05/10/16   [provider]  Methylcellulose, Laxative, (GNP FIBER THERAPY PO) Take by mouth.    [provider]  oxybutynin (DITROPAN) 5 MG tablet TAKE 1 TABLET TWICE DAILY AS NEEDED  FOR  BLADDER  SPASMS 05/31/17   Birdie Sons, MD  pravastatin (PRAVACHOL) 40 MG tablet Take 1 tablet (40 mg total) by mouth at bedtime. 06/01/16   Birdie Sons, MD  predniSONE (DELTASONE) 5 MG tablet Take 5 mg by mouth daily with breakfast.    [provider]  promethazine (PHENERGAN) 25 MG tablet Take 1 tablet (25 mg total) by mouth every 8 (eight) hours as needed for nausea or vomiting. 09/11/17   Birdie Sons, MD  valACYclovir (VALTREX) 1000 MG tablet as needed. 06/03/09   [provider]    Inpatient Medications:  . aspirin EC  81 mg Oral Daily  . digoxin  125 mcg Intravenous Daily  . enoxaparin (LOVENOX) injection  40 mg Subcutaneous Q24H  . hydroxychloroquine  200 mg Oral Daily  . potassium chloride  40 mEq Oral Once  . pravastatin  40 mg Oral QHS  . predniSONE  5 mg Oral Q breakfast   . amiodarone 60 mg/hr (11/16/17 1123)   Followed by  . amiodarone    . [START ON 11/17/2017] azithromycin (ZITHROMAX) 500 MG IVPB (Vial-Mate [employer])    . [START ON 11/17/2017] cefTRIAXone (ROCEPHIN) IVPB 1 gram/100 mL NS (Mini-Bag Plus)      Allergies:  Allergies  Allergen Reactions  . Etodolac     Other reaction(s): Unknown  . Sulfa Antibiotics   . Tetracycline     Social History   Socioeconomic History  . Marital status: Widowed    Spouse name: Not on file  . Number of children:  Not on file  . Years of education: Not on file  . Highest education level: Not on file  Occupational History  . Not on file  Social Needs  . Financial resource strain: Not on file  . Food insecurity:    Worry: Not on file    Inability: Not on file  . Transportation needs:    Medical: Not on file    Non-medical: Not on file  Tobacco Use  . Smoking status: Never Smoker  . Smokeless tobacco: Never Used  Substance and Sexual Activity  . Alcohol use: No  . Drug use: No  . Sexual activity: Not on file  Lifestyle  . Physical activity:    Days per week: Not on file    Minutes per session: Not on file  . Stress: Not on file  Relationships  . Social connections:  Talks on phone: Not on file    Gets together: Not on file    Attends religious service: Not on file    Active member of club or organization: Not on file    Attends meetings of clubs or organizations: Not on file    Relationship status: Not on file  . Intimate partner violence:    Fear of current or ex partner: Not on file    Emotionally abused: Not on file    Physically abused: Not on file    Forced sexual activity: Not on file  Other Topics Concern  . Not on file  Social History Narrative  . Not on file     Family History  Problem Relation Age of Onset  . Alcohol abuse Father   . Raynaud syndrome Sister      Review of Systems: A 12-system review of systems was performed and is negative except as noted in the HPI.  Labs: Recent Labs    11/15/17 2243 11/16/17 0153 11/16/17 0516 11/16/17 1016  TROPONINI 0.12* 0.12* 0.13* 0.12*   Lab Results  Component Value Date   WBC 8.5 11/16/2017   HGB 11.7 (L) 11/16/2017   HCT 34.4 (L) 11/16/2017   MCV 97.3 11/16/2017   PLT 178 11/16/2017    Recent Labs  Lab 11/15/17 2243 11/16/17 0516  NA 134* 138  K 3.0* 2.9*  CL 102 108  CO2 21* 20*  BUN 12 11  CREATININE 0.78 0.65  CALCIUM 8.2* 7.2*  PROT 6.7  --   BILITOT 2.2*  --   ALKPHOS 63  --   ALT 21   --   AST 45*  --   GLUCOSE 119* 94   Lab Results  Component Value Date   CHOL 169 07/16/2015   HDL 87 07/16/2015   LDLCALC 71 07/16/2015   TRIG 56 07/16/2015   No results found for: DDIMER  Radiology/Studies:  Dg Chest 2 View  Result Date: 11/16/2017 CLINICAL DATA:  72 year old female with cough and shortness of breath. EXAM: CHEST - 2 VIEW COMPARISON:  Chest radiograph dated 11/15/2017 FINDINGS: There is no focal consolidation, pleural effusion, or pneumothorax. There is moderate cardiomegaly. Coronary vascular stent and calcified mitral annulus noted. There is osteopenia with degenerative changes of the spine. No acute osseous pathology. IMPRESSION: No acute cardiopulmonary process.  Cardiomegaly. Electronically Signed   By: Anner Crete M.D.   On: 11/16/2017 01:01   Dg Chest 2 View  Result Date: 11/15/2017 CLINICAL DATA:  Nonproductive cough. EXAM: CHEST - 2 VIEW COMPARISON:  11/29/2009. FINDINGS: Mediastinum hilar structures normal. Mild right base subsegmental atelectasis. No pleural effusion or pneumothorax. Coronary stents. No acute bony abnormality. Degenerative changes both shoulders. Degenerative changes thoracic spine. IMPRESSION: 1.  Cardiomegaly.  No pulmonary venous congestion.  Coronary stents. 2.  Low lung volumes with mild right base subsegmental atelectasis. Electronically Signed   By: Marcello Moores  Register   On: 11/15/2017 13:36   Dg Tibia/fibula Left  Result Date: 11/15/2017 CLINICAL DATA:  Pain following fall EXAM: LEFT TIBIA AND FIBULA - 2 VIEW COMPARISON:  None. FINDINGS: Frontal and lateral views were obtained. Bones are osteoporotic. There is an old healed fracture of the distal fibular diaphysis with alignment near anatomic. There is postoperative change in the talus region. There is a total knee replacement with prosthetic components well-seated. No acute fracture or dislocation. No abnormal periosteal reaction. There are phleboliths at several sites in the lower  extremity. IMPRESSION: No acute fracture or dislocation. Bones  osteoporotic. Old healed fracture with remodeling distal fibula. Postoperative change in the talus noted as well as total knee replacement. Electronically Signed   By: Lowella Grip III M.D.   On: 11/15/2017 13:36   Ct Pelvis Wo Contrast  Result Date: 11/15/2017 CLINICAL DATA:  Status post fall. Pelvic fracture suspected. Fell 2 weeks ago. EXAM: CT PELVIS WITHOUT CONTRAST TECHNIQUE: Multidetector CT imaging of the pelvis was performed following the standard protocol without intravenous contrast. COMPARISON:  None. FINDINGS: Bones/Joint/Cartilage Generalized osteopenia. No acute fracture or dislocation. Old right hip fracture status post ORIF. Normal alignment. No joint effusion. Mild osteoarthritis of sacroiliac joints. Ligaments Ligaments are suboptimally evaluated by CT. Muscles and Tendons Muscles are normal Soft tissue No fluid collection or hematoma.  No soft tissue mass. IMPRESSION: 1. No acute osseous injury of the pelvis. Given the patient's age and osteopenia, if there is persistent clinical concern for an occult hip fracture, a MRI of the hip is recommended for increased sensitivity. Electronically Signed   By: Kathreen Devoid   On: 11/15/2017 15:02   Dg Hip Unilat With Pelvis 2-3 Views Left  Result Date: 11/15/2017 CLINICAL DATA:  Left hip pain and weakness. EXAM: DG HIP (WITH OR WITHOUT PELVIS) 2-3V LEFT COMPARISON:  None. FINDINGS: Bones are diffusely demineralized. Patient is status post ORIF of the right acetabulum and proximal right femur. Right femoral hardware incompletely visualized. SI joints and symphysis pubis unremarkable. AP and frog-leg lateral views of the left hip show no evidence for femoral neck fracture. Focus of cortical irregularity identified along the posterior aspect of the left inferior pubic ramus with subtle linear lucency in the anterior aspect of the left pubic bone. IMPRESSION: 1. No evidence for left  femoral neck fracture. 2. Subtle changes in the left inferior pubic bone and posterior left inferior pubic ramus raising the question of nondisplaced fracture. Although not definite, if the patient has signs/symptoms referable to this region, Judet views of the pelvis may prove helpful to further evaluate. Electronically Signed   By: Misty Stanley M.D.   On: 11/15/2017 14:06    Wt Readings from Last 3 Encounters:  11/16/17 49.2 kg (108 lb 8 oz)  09/11/17 54.3 kg (119 lb 9.6 oz)  07/13/17 53.1 kg (117 lb)    EKG: Initially sinus tachycardia with nonspecific ST-T wave changes.  Currently atrial fibrillation with rapid ventricular response.  Physical Exam:  Blood pressure (!) 75/64, pulse 79, temperature (!) 97.5 F (36.4 C), resp. rate 18, height 5\' 1"  (1.549 m), weight 49.2 kg (108 lb 8 oz), SpO2 92 %. Body mass index is 20.5 kg/m. General: Well developed, well nourished, in no acute distress. Head: Normocephalic, atraumatic, sclera non-icteric, no xanthomas, nares are without discharge.  Neck: Negative for carotid bruits. JVD not elevated. Lungs: Clear bilaterally to auscultation without wheezes, rales does have bilateral rhonchi. Breathing is occasionally labored. Heart: Irregularly irregular with S1 S2. No murmurs, rubs, or gallops appreciated. Abdomen: Soft, non-tender, non-distended with normoactive bowel sounds. No hepatomegaly. No rebound/guarding. No obvious abdominal masses. Msk:  Strength and tone appear normal for age. Extremities: No clubbing or cyanosis. No edema.  Distal pedal pulses are 2+ and equal bilaterally. Neuro: Alert and oriented X 3. No facial asymmetry. No focal deficit. Moves all extremities spontaneously. Psych:  Responds to questions appropriately with a normal affect.     Assessment and Plan  72 year old female with nonischemic cardiomyopathy and EF of 20 to 25% admitted with shortness of breath and noted to have  developed atrial fibrillation with rapid  ventricular response.  Patient reports noncompliance with the medications instructions for approximately 1 week.  She is currently in atrial fibrillation with fairly rapid ventricular response.  She has hemodynamic instability with blood pressure of 70-1 10 depending on her heart rate.  She was given IV amiodarone with some improvement in her heart rate but consistent hypotension when her heart rate increases over 140.  She is mildly hypokalemic.  We will need to replete potassium and continue with amiodarone drip.  Will use digoxin 0.125 mg IV as needed for rate control due to hemodynamics.  Will follow and make further recommendations based on clinical response to the aforementioned maneuvers.  Her troponin does not extend to suggest acute ischemic event.  Signed, Teodoro Spray MD 11/16/2017, 12:32 PM Pager: [phone]

## 2017-11-16 NOTE — Progress Notes (Signed)
Rapid Response Event Note  Overview: Patient complains of neck pain radiating to the jaw but denies chest pain but states she has a heaviness. HR on monitor found to be between 170-180's. BP 70's/60's. Alert and oriented. Ekg done and read by Dr. Fletcher Anon and interpreted to be AFIB RVR. Dr. Ubaldo Glassing notified and orders received to place patient on an amiodarone drip. Dr. Ubaldo Glassing did note patients BP. Will monitor patients bp while amio drip.       Initial Focused Assessment: HR 170's-180's BP 75/64. SPO2 97%. Heart sounds irregular lung sound fine crackles in bases. Alert and oriented.   Interventions:Initiated Amiodarone drip with bolus. Started an additional IV in the right upper forearm.   Plan of Care (if not transferred):keep patient on Amio drip and monitor blood pressure for further evaluation.   Event Summary:   at      at          [facility]

## 2017-11-17 DIAGNOSIS — E440 Moderate protein-calorie malnutrition: Secondary | ICD-10-CM

## 2017-11-17 MED ORDER — BENZONATATE 100 MG PO CAPS
100.0000 mg | ORAL_CAPSULE | Freq: Three times a day (TID) | ORAL | Status: DC | PRN
  Administered 2017-11-17 – 2017-11-21 (×3): 100 mg via ORAL
  Filled 2017-11-17 (×3): qty 1

## 2017-11-17 MED ORDER — HYDROCOD POLST-CPM POLST ER 10-8 MG/5ML PO SUER
5.0000 mL | Freq: Two times a day (BID) | ORAL | Status: DC | PRN
  Administered 2017-11-20 – 2017-11-21 (×2): 5 mL via ORAL
  Filled 2017-11-17 (×2): qty 5

## 2017-11-17 NOTE — Progress Notes (Addendum)
[name] NAME: Tina Salinas    MR#:  [id number]  DATE OF BIRTH:  12-Jan-1946  SUBJECTIVE:  Patient seen and evaluated today Has decreased shortness of breath Has palpitations No chest pain  Has borderline blood pressure No dizziness  REVIEW OF SYSTEMS:    ROS  CONSTITUTIONAL: No documented fever. No fatigue, weakness. No weight gain, no weight loss.  EYES: No blurry or double vision.  ENT: No tinnitus. No postnasal drip. No redness of the oropharynx.  RESPIRATORY: No cough, no wheeze, no hemoptysis. Has dyspnea.  CARDIOVASCULAR: No chest pain. No orthopnea. Has palpitations. No syncope.  GASTROINTESTINAL: No nausea, no vomiting or diarrhea. No abdominal pain. No melena or hematochezia.  GENITOURINARY: No dysuria or hematuria.  ENDOCRINE: No polyuria or nocturia. No heat or cold intolerance.  HEMATOLOGY: No anemia. No bruising. No bleeding.  INTEGUMENTARY: No rashes. No lesions.  MUSCULOSKELETAL: No arthritis. No swelling. No gout.  NEUROLOGIC: No numbness, tingling, or ataxia. No seizure-type activity.  PSYCHIATRIC: No anxiety. No insomnia. No ADD.   DRUG ALLERGIES:   Allergies  Allergen Reactions  . Etodolac     Other reaction(s): Unknown  . Sulfa Antibiotics   . Tetracycline     VITALS:  Blood pressure (!) 91/46, pulse 60, temperature 98.1 F (36.7 C), temperature source Oral, resp. rate 18, height 5\' 1"  (1.549 m), weight 49.2 kg (108 lb 8 oz), SpO2 97 %.  PHYSICAL EXAMINATION:   Physical Exam  GENERAL:  72 y.o.-year-old patient lying in the bed with no acute distress.  EYES: Pupils equal, round, reactive to light and accommodation. No scleral icterus. Extraocular muscles intact.  HEENT: Head atraumatic, normocephalic. Oropharynx and nasopharynx clear.  NECK:  Supple, no jugular venous distention. No thyroid enlargement, no tenderness.  LUNGS: Decreased breath sounds bilaterally, bibasilar crepitations heard.  No use of accessory muscles of respiration.  CARDIOVASCULAR: S1, S2 irregular. No murmurs, rubs, or gallops.  ABDOMEN: Soft, nontender, nondistended. Bowel sounds present. No organomegaly or mass.  EXTREMITIES: No cyanosis, clubbing or edema b/l.    NEUROLOGIC: Cranial nerves II through XII are intact. No focal Motor or sensory deficits b/l.   PSYCHIATRIC: The patient is alert and oriented x 3.  SKIN: No obvious rash, lesion, or ulcer.   LABORATORY PANEL:   CBC Recent Labs  Lab 11/16/17 0516  WBC 8.5  HGB 11.7*  HCT 34.4*  PLT 178   ------------------------------------------------------------------------------------------------------------------ Chemistries  Recent Labs  Lab 11/15/17 2243 11/16/17 0516 11/16/17 1401  NA 134* 138  --   K 3.0* 2.9* 3.4*  CL 102 108  --   CO2 21* 20*  --   GLUCOSE 119* 94  --   BUN 12 11  --   CREATININE 0.78 0.65  --   CALCIUM 8.2* 7.2*  --   MG  --   --  1.8  AST 45*  --   --   ALT 21  --   --   ALKPHOS 63  --   --   BILITOT 2.2*  --   --    ------------------------------------------------------------------------------------------------------------------  Cardiac Enzymes Recent Labs  Lab 11/16/17 1634  TROPONINI 0.10*   ------------------------------------------------------------------------------------------------------------------  RADIOLOGY:  Dg Chest 2 View  Result Date: 11/16/2017 CLINICAL DATA:  72 year old female with cough and shortness of breath. EXAM: CHEST - 2 VIEW COMPARISON:  Chest radiograph dated 11/15/2017 FINDINGS: There is no focal consolidation, pleural effusion, or pneumothorax. There is moderate cardiomegaly. Coronary vascular  stent and calcified mitral annulus noted. There is osteopenia with degenerative changes of the spine. No acute osseous pathology. IMPRESSION: No acute cardiopulmonary process.  Cardiomegaly. Electronically Signed   By: Anner Crete M.D.   On: 11/16/2017 01:01   Dg Chest 2  View  Result Date: 11/15/2017 CLINICAL DATA:  Nonproductive cough. EXAM: CHEST - 2 VIEW COMPARISON:  11/29/2009. FINDINGS: Mediastinum hilar structures normal. Mild right base subsegmental atelectasis. No pleural effusion or pneumothorax. Coronary stents. No acute bony abnormality. Degenerative changes both shoulders. Degenerative changes thoracic spine. IMPRESSION: 1.  Cardiomegaly.  No pulmonary venous congestion.  Coronary stents. 2.  Low lung volumes with mild right base subsegmental atelectasis. Electronically Signed   By: Marcello Moores  Register   On: 11/15/2017 13:36   Dg Tibia/fibula Left  Result Date: 11/15/2017 CLINICAL DATA:  Pain following fall EXAM: LEFT TIBIA AND FIBULA - 2 VIEW COMPARISON:  None. FINDINGS: Frontal and lateral views were obtained. Bones are osteoporotic. There is an old healed fracture of the distal fibular diaphysis with alignment near anatomic. There is postoperative change in the talus region. There is a total knee replacement with prosthetic components well-seated. No acute fracture or dislocation. No abnormal periosteal reaction. There are phleboliths at several sites in the lower extremity. IMPRESSION: No acute fracture or dislocation. Bones osteoporotic. Old healed fracture with remodeling distal fibula. Postoperative change in the talus noted as well as total knee replacement. Electronically Signed   By: Lowella Grip III M.D.   On: 11/15/2017 13:36   Ct Pelvis Wo Contrast  Result Date: 11/15/2017 CLINICAL DATA:  Status post fall. Pelvic fracture suspected. Fell 2 weeks ago. EXAM: CT PELVIS WITHOUT CONTRAST TECHNIQUE: Multidetector CT imaging of the pelvis was performed following the standard protocol without intravenous contrast. COMPARISON:  None. FINDINGS: Bones/Joint/Cartilage Generalized osteopenia. No acute fracture or dislocation. Old right hip fracture status post ORIF. Normal alignment. No joint effusion. Mild osteoarthritis of sacroiliac joints. Ligaments  Ligaments are suboptimally evaluated by CT. Muscles and Tendons Muscles are normal Soft tissue No fluid collection or hematoma.  No soft tissue mass. IMPRESSION: 1. No acute osseous injury of the pelvis. Given the patient's age and osteopenia, if there is persistent clinical concern for an occult hip fracture, a MRI of the hip is recommended for increased sensitivity. Electronically Signed   By: Kathreen Devoid   On: 11/15/2017 15:02   Dg Hip Unilat With Pelvis 2-3 Views Left  Result Date: 11/15/2017 CLINICAL DATA:  Left hip pain and weakness. EXAM: DG HIP (WITH OR WITHOUT PELVIS) 2-3V LEFT COMPARISON:  None. FINDINGS: Bones are diffusely demineralized. Patient is status post ORIF of the right acetabulum and proximal right femur. Right femoral hardware incompletely visualized. SI joints and symphysis pubis unremarkable. AP and frog-leg lateral views of the left hip show no evidence for femoral neck fracture. Focus of cortical irregularity identified along the posterior aspect of the left inferior pubic ramus with subtle linear lucency in the anterior aspect of the left pubic bone. IMPRESSION: 1. No evidence for left femoral neck fracture. 2. Subtle changes in the left inferior pubic bone and posterior left inferior pubic ramus raising the question of nondisplaced fracture. Although not definite, if the patient has signs/symptoms referable to this region, Judet views of the pelvis may prove helpful to further evaluate. Electronically Signed   By: Misty Stanley M.D.   On: 11/15/2017 14:06     ASSESSMENT AND PLAN:  72 year old female patient with history of coronary artery disease, combined systolic  and diastolic heart failure, hypertension, hyperlipidemia currently under hospitalist service for heart failure exacerbation.  1 decompensated heart failure.  Blood pressure is borderline so we will hold off on diuretics Troponins were elevated secondary to demand ischemia CHF exacerbation secondary to  pneumonia Echo - Left ventricle: The cavity size was moderately dilated. Systolic   function was severely reduced. The estimated ejection fraction   was in the range of 20% to 25%. - Aortic valve: There was moderate stenosis. Valve area (VTI): 0.84   cm^2. Valve area (Vmax): 1 cm^2. Valve area (Vmean): 1.01 cm^2. - Mitral valve: There was moderate regurgitation. Valve area by   continuity equation (using LVOT flow): 1.25 cm^2. - Left atrium: The atrium was mildly dilated.   2 atrial fibrillation with rapid rate.  Off amiodarone drip Continue oral amiodarone Cardiology follow-up  3.  Right lung pneumonia Continue Rocephin and Zithromax antibiotics  4.  Coronary artery disease Continue aspirin and statin  5.  Monitor electrolytes  6.  DVT prophylaxis subcu Lovenox daily  All the records are reviewed and case discussed with Care Management/Social Worker. Management plans discussed with the patient, family and they are in agreement.  CODE STATUS: Full code  DVT Prophylaxis: SCDs  TOTAL TIME TAKING CARE OF THIS PATIENT: 36 minutes.   POSSIBLE D/C IN 2 to 3 DAYS, DEPENDING ON CLINICAL CONDITION.  Saundra Shelling M.D on 11/17/2017 at 12:18 PM  Between 7am to 6pm - Pager - [phone]  After 6pm go to [email or web address] - password [email or web address] [facility]  [facility] [place] [facility]  Office  [phone]  CC: Primary care physician; Birdie Sons, MD  Note: This dictation was prepared with Dragon dictation along with smaller phrase technology. Any transcriptional errors that result from this process are unintentional.

## 2017-11-17 NOTE — Plan of Care (Signed)
Patient alert and oriented, denies any pain this shift. Minimal dyspnea with exertion noted, patient compliant with medications and states that she feels she is doing better than yesterday, other than cough is still bothering her. Vitals stable- BP baseline low per report. MD aware. Patient on PO amiodarone this shift. IV abx being given for PNA.

## 2017-11-17 NOTE — Progress Notes (Signed)
Pt. Has been awake all night. She keeps the television and the bright light on throughout the night. Pt. Asked if we could dim her lights, she said no, "I like the bright light".

## 2017-11-17 NOTE — Progress Notes (Signed)
CCMD called- patient had a 5 beat run of V-tach, patient asymptomatic- MD, Pyreddy made aware. Patient just received PO amiodarone. No new orders at this time.

## 2017-11-18 LAB — BASIC METABOLIC PANEL
ANION GAP: 5 (ref 5–15)
BUN: 10 mg/dL (ref 6–20)
CHLORIDE: 111 mmol/L (ref 101–111)
CO2: 23 mmol/L (ref 22–32)
Calcium: 7.8 mg/dL — ABNORMAL LOW (ref 8.9–10.3)
Creatinine, Ser: 0.69 mg/dL (ref 0.44–1.00)
GFR calc non Af Amer: >60 mL/min (ref >60)
Glucose, Bld: 91 mg/dL (ref 65–99)
Potassium: 3.9 mmol/L (ref 3.5–5.1)
Sodium: 139 mmol/L (ref 135–145)

## 2017-11-18 LAB — MAGNESIUM: Magnesium: 2.4 mg/dL (ref 1.7–2.4)

## 2017-11-18 NOTE — Evaluation (Addendum)
Physical Therapy Evaluation Patient Details Name: Tina Salinas MRN: [id number] DOB: 10-24-1945 Today's Date: 11/18/2017   History of Present Illness  presented to ER secondary to cough, SOB; admitted with acute/chronic CHF (EF 20%), CAP.  Hospital course complicated by afib with RVR (managed with IV, now transitioned to PO, amioderone); ruled out for ACS.  Clinical Impression  Upon evaluation, patient alert and oriented; follows all commands and demonstrates good effort with all mobility tasks.  Eager for OOB, gait attempts; strongly voicing desire to return home at discharge.  Bilat UE strength and ROM grossly symmetrical and WFL; LEs with baseline strength, tone and ROM deficits (associated with baseline CP).  Currently able to complete all bed mobility with min assist; sit/stand, basic transfer and gait (175') with RW, cga/min assist.  Partially reciprocal stepping pattern with excessive bilat LE adduct/IR (R > L) noted throughout gait cycle; no overt buckling or LOB noted.  Confidence in gait improving as distance progressed. HR stable in 80-90s throughout session; denies chest pain/pressure. Would benefit from skilled PT to address above deficits and promote optimal return to PLOF; Recommend transition to [place] upon discharge from acute hospitalization.     Follow Up Recommendations Home health PT(HHOT, HHRN, HHaide)    Equipment Recommendations  Rolling walker with 5" wheels(youth RW-states hers is in bad repair; would like another if qualified)    Recommendations for Other Services       Precautions / Restrictions Precautions Precautions: Fall Restrictions Weight Bearing Restrictions: No      Mobility  Bed Mobility Overal bed mobility: Needs Assistance Bed Mobility: Supine to Sit     Supine to sit: Min assist        Transfers Overall transfer level: Needs assistance Equipment used: Rolling walker (2 wheeled) Transfers: Sit to/from Stand Sit to Stand: Min guard;Min  assist         General transfer comment: very heavy reliance of UEs on RW for movement transitions  Ambulation/Gait Ambulation/Gait assistance: Min guard Ambulation Distance (Feet): 175 Feet Assistive device: Rolling walker (2 wheeled)       General Gait Details: partially reciprocal stepping pattern with excessive bilat LE IR (R > L); forward flexed posture; slow and guarded with increased adduct/HS tone (?). No overt buckling or LOB; patient feels performance is near baseline (and much improved from week prior to admission)  Stairs            Wheelchair Mobility    Modified Rankin (Stroke Patients Only)       Balance Overall balance assessment: Needs assistance Sitting-balance support: No upper extremity supported;Feet supported Sitting balance-Leahy Scale: Good     Standing balance support: Bilateral upper extremity supported Standing balance-Leahy Scale: Fair                               Pertinent Vitals/Pain Pain Assessment: No/denies pain    Home Living Family/patient expects to be discharged to:: Private residence Living Arrangements: Alone Available Help at Discharge: Friend(s);Available PRN/intermittently Type of Home: House Home Access: Ramped entrance     Home Layout: One level Home Equipment: Walker - 2 wheels(3WRW)      Prior Function Level of Independence: Independent with assistive device(s)         Comments: Mod indep with ADLs, household and limited community mobilization with RW; denies fall history in previous six months (though week immediately prior to admission, reports daily falls related to this illness)  Hand Dominance        Extremity/Trunk Assessment   Upper Extremity Assessment Upper Extremity Assessment: Overall WFL for tasks assessed    Lower Extremity Assessment Lower Extremity Assessment: Generalized weakness(grossly 3-/5 throughout bilat LEs, bilat hip IR with increased tone/activation of  bilat hip adduct, bilat ankle DF to neutral)       Communication   Communication: HOH  Cognition Arousal/Alertness: Awake/alert Behavior During Therapy: WFL for tasks assessed/performed Overall Cognitive Status: Within Functional Limits for tasks assessed                                        General Comments      Exercises Other Exercises Other Exercises: Rolling bilat, min assist, for mangement of incontinent bowel.  Reviewed toileting schedule to assist with bowel/bladder management and prevention of incontinence episodes.  Patient voiced understanding of information; CNA encouraged for OOB to [facility] for all future toileting needs.   Assessment/Plan    PT Assessment Patient needs continued PT services  PT Problem List Decreased strength;Decreased range of motion;Decreased activity tolerance;Decreased balance;Decreased mobility;Cardiopulmonary status limiting activity;Impaired tone       PT Treatment Interventions DME instruction;Gait training;Functional mobility training;Therapeutic activities;Therapeutic exercise;Balance training;Patient/family education    PT Goals (Current goals can be found in the Care Plan section)  Acute Rehab PT Goals Patient Stated Goal: I want to go home! PT Goal Formulation: With patient Time For Goal Achievement: 12/02/17 Potential to Achieve Goals: Good    Frequency Min 2X/week   Barriers to discharge Decreased caregiver support      Co-evaluation               AM-PAC PT "6 Clicks" Daily Activity  Outcome Measure Difficulty turning over in bed (including adjusting bedclothes, sheets and blankets)?: Unable Difficulty moving from lying on back to sitting on the side of the bed? : Unable Difficulty sitting down on and standing up from a chair with arms (e.g., wheelchair, bedside commode, etc,.)?: A Little Help needed moving to and from a bed to chair (including a wheelchair)?: A Little Help needed walking in hospital  room?: A Little Help needed climbing 3-5 steps with a railing? : A Lot 6 Click Score: 13    End of Session Equipment Utilized During Treatment: Gait belt Activity Tolerance: Patient tolerated treatment well Patient left: in chair;with chair alarm set;with call bell/phone within reach;with nursing/sitter in room(CNA at bedside for ADL) Nurse Communication: Mobility status PT Visit Diagnosis: Muscle weakness (generalized) (M62.81);Difficulty in walking, not elsewhere classified (R26.2)    Time: 6712-4580 PT Time Calculation (min) (ACUTE ONLY): 37 min   Charges:   PT Evaluation $PT Eval Moderate Complexity: 1 Mod PT Treatments $Gait Training: 8-22 mins $Therapeutic Activity: 8-22 mins   PT G Codes:        Roderick Sweezy H. Owens Shark, PT, DPT, NCS 11/18/17, 4:13 PM [phone]

## 2017-11-18 NOTE — Plan of Care (Signed)
  Problem: Clinical Measurements: Goal: Ability to maintain clinical measurements within normal limits will improve Outcome: Not Progressing Note:  RBC's are only at 3.5 today. Will continue to monitor lab values. Wenda Low [facility]

## 2017-11-18 NOTE — Progress Notes (Signed)
Asked patient this AM if she knows when she needs to have a BM. Looks straight at me and says, "I haven't looked." Then patient tries calling the cafeteria to ask for corn flakes with the TV remote. Had to explain to the patient that the phone would be a better option for ordering food. Wenda Low [facility]

## 2017-11-18 NOTE — Progress Notes (Signed)
Patient Name: Tina Salinas Date of Encounter: 11/18/2017  Hospital Problem List     Principal Problem:   Acute on chronic combined systolic and diastolic CHF (congestive heart failure) ([place]) Active Problems:   Essential (primary) hypertension   Hypercholesteremia   Rheumatoid arthritis with rheumatoid factor (HCC)   Elevated troponin   CAP (community acquired pneumonia)   A-fib ([place])   Malnutrition of moderate degree    Patient Profile     Pt with cardiomyopathy admitted with incresaing sob and noted to be in afib with rvr. COnverted to nsr with amiodarone.   Subjective   Somewhat confused but remains in nsr. Desires to go home.  Inpatient Medications    . amiodarone  400 mg Oral BID  . aspirin EC  81 mg Oral Daily  . azithromycin  500 mg Oral Q2200  . enoxaparin (LOVENOX) injection  40 mg Subcutaneous Q24H  . hydroxychloroquine  200 mg Oral Daily  . multivitamin with minerals  1 tablet Oral Daily  . pravastatin  40 mg Oral QHS  . predniSONE  5 mg Oral Q breakfast    Vital Signs    Vitals:   11/17/17 1624 11/17/17 2014 11/18/17 0516 11/18/17 0853  BP: 108/85 (!) 97/56 110/65 (!) 107/57  Pulse: 78 72 70 70  Resp: 18 17 17 18   Temp: 97.6 F (36.4 C) 97.7 F (36.5 C) 98.3 F (36.8 C) 97.9 F (36.6 C)  TempSrc: Oral Oral Oral Oral  SpO2: 96% 99% 96% 97%  Weight:   50.6 kg (111 lb 8 oz)   Height:        Intake/Output Summary (Last 24 hours) at 11/18/2017 1323 Last data filed at 11/18/2017 1123 Gross per 24 hour  Intake 240 ml  Output 725 ml  Net -485 ml   Filed Weights   11/16/17 0423 11/17/17 0524 11/18/17 0516  Weight: 49.2 kg (108 lb 8 oz) 49.2 kg (108 lb 8 oz) 50.6 kg (111 lb 8 oz)    Physical Exam    GEN: Well nourished, well developed, in no acute distress.  HEENT: normal.  Neck: Supple, no JVD, carotid bruits, or masses. Cardiac: RRR, no murmurs, rubs, or gallops. No clubbing, cyanosis, edema.  Radials/DP/PT 2+ and equal bilaterally.   Respiratory:  Respirations regular and unlabored, clear to auscultation bilaterally. GI: Soft, nontender, nondistended, BS + x 4. MS: no deformity or atrophy. Skin: warm and dry, no rash. Neuro:  Strength and sensation are intact. Psych: Normal affect.  Labs    CBC Recent Labs    11/15/17 2243 11/16/17 0516  WBC 9.6 8.5  HGB 13.1 11.7*  HCT 38.2 34.4*  MCV 96.3 97.3  PLT 204 956   Basic Metabolic Panel Recent Labs    11/16/17 0516 11/16/17 1401 11/18/17 0525  NA 138  --  139  K 2.9* 3.4* 3.9  CL 108  --  111  CO2 20*  --  23  GLUCOSE 94  --  91  BUN 11  --  10  CREATININE 0.65  --  0.69  CALCIUM 7.2*  --  7.8*  MG  --  1.8 2.4   Liver Function Tests Recent Labs    11/15/17 2243  AST 45*  ALT 21  ALKPHOS 63  BILITOT 2.2*  PROT 6.7  ALBUMIN 3.2*   No results for input(s): LIPASE, AMYLASE in the last 72 hours. Cardiac Enzymes Recent Labs    11/16/17 0516 11/16/17 1016 11/16/17 1634  TROPONINI 0.13*  0.12* 0.10*   BNP Recent Labs    11/15/17 2243  BNP 1,015.0*   D-Dimer No results for input(s): DDIMER in the last 72 hours. Hemoglobin A1C No results for input(s): HGBA1C in the last 72 hours. Fasting Lipid Panel No results for input(s): CHOL, HDL, LDLCALC, TRIG, CHOLHDL, LDLDIRECT in the last 72 hours. Thyroid Function Tests No results for input(s): TSH, T4TOTAL, T3FREE, THYROIDAB in the last 72 hours.  Invalid input(s): FREET3  Telemetry    nsr  ECG    nsr  Radiology    Dg Chest 2 View  Result Date: 11/16/2017 CLINICAL DATA:  72 year old female with cough and shortness of breath. EXAM: CHEST - 2 VIEW COMPARISON:  Chest radiograph dated 11/15/2017 FINDINGS: There is no focal consolidation, pleural effusion, or pneumothorax. There is moderate cardiomegaly. Coronary vascular stent and calcified mitral annulus noted. There is osteopenia with degenerative changes of the spine. No acute osseous pathology. IMPRESSION: No acute cardiopulmonary  process.  Cardiomegaly. Electronically Signed   By: Anner Crete M.D.   On: 11/16/2017 01:01   Dg Chest 2 View  Result Date: 11/15/2017 CLINICAL DATA:  Nonproductive cough. EXAM: CHEST - 2 VIEW COMPARISON:  11/29/2009. FINDINGS: Mediastinum hilar structures normal. Mild right base subsegmental atelectasis. No pleural effusion or pneumothorax. Coronary stents. No acute bony abnormality. Degenerative changes both shoulders. Degenerative changes thoracic spine. IMPRESSION: 1.  Cardiomegaly.  No pulmonary venous congestion.  Coronary stents. 2.  Low lung volumes with mild right base subsegmental atelectasis. Electronically Signed   By: Marcello Moores  Register   On: 11/15/2017 13:36   Dg Tibia/fibula Left  Result Date: 11/15/2017 CLINICAL DATA:  Pain following fall EXAM: LEFT TIBIA AND FIBULA - 2 VIEW COMPARISON:  None. FINDINGS: Frontal and lateral views were obtained. Bones are osteoporotic. There is an old healed fracture of the distal fibular diaphysis with alignment near anatomic. There is postoperative change in the talus region. There is a total knee replacement with prosthetic components well-seated. No acute fracture or dislocation. No abnormal periosteal reaction. There are phleboliths at several sites in the lower extremity. IMPRESSION: No acute fracture or dislocation. Bones osteoporotic. Old healed fracture with remodeling distal fibula. Postoperative change in the talus noted as well as total knee replacement. Electronically Signed   By: Lowella Grip III M.D.   On: 11/15/2017 13:36   Ct Pelvis Wo Contrast  Result Date: 11/15/2017 CLINICAL DATA:  Status post fall. Pelvic fracture suspected. Fell 2 weeks ago. EXAM: CT PELVIS WITHOUT CONTRAST TECHNIQUE: Multidetector CT imaging of the pelvis was performed following the standard protocol without intravenous contrast. COMPARISON:  None. FINDINGS: Bones/Joint/Cartilage Generalized osteopenia. No acute fracture or dislocation. Old right hip fracture  status post ORIF. Normal alignment. No joint effusion. Mild osteoarthritis of sacroiliac joints. Ligaments Ligaments are suboptimally evaluated by CT. Muscles and Tendons Muscles are normal Soft tissue No fluid collection or hematoma.  No soft tissue mass. IMPRESSION: 1. No acute osseous injury of the pelvis. Given the patient's age and osteopenia, if there is persistent clinical concern for an occult hip fracture, a MRI of the hip is recommended for increased sensitivity. Electronically Signed   By: Kathreen Devoid   On: 11/15/2017 15:02   Dg Hip Unilat With Pelvis 2-3 Views Left  Result Date: 11/15/2017 CLINICAL DATA:  Left hip pain and weakness. EXAM: DG HIP (WITH OR WITHOUT PELVIS) 2-3V LEFT COMPARISON:  None. FINDINGS: Bones are diffusely demineralized. Patient is status post ORIF of the right acetabulum and proximal right femur.  Right femoral hardware incompletely visualized. SI joints and symphysis pubis unremarkable. AP and frog-leg lateral views of the left hip show no evidence for femoral neck fracture. Focus of cortical irregularity identified along the posterior aspect of the left inferior pubic ramus with subtle linear lucency in the anterior aspect of the left pubic bone. IMPRESSION: 1. No evidence for left femoral neck fracture. 2. Subtle changes in the left inferior pubic bone and posterior left inferior pubic ramus raising the question of nondisplaced fracture. Although not definite, if the patient has signs/symptoms referable to this region, Judet views of the pelvis may prove helpful to further evaluate. Electronically Signed   By: Misty Stanley M.D.   On: 11/15/2017 14:06    Assessment & Plan    afib-remains in nsr. Will continue with amidarone. High fall risk with frequent falls at home. Will defer antiocagulation at presnt.   Signed, Javier Docker Lorelee Mclaurin MD 11/18/2017, 1:23 PM  Pager: [phone]

## 2017-11-18 NOTE — Progress Notes (Signed)
[place] at [place] NAME: Tina Salinas    MR#:  [id number]  DATE OF BIRTH:  09-15-1945  SUBJECTIVE:  Patient seen and evaluated today Has decreased shortness of breath and weaned off oxygen Has no palpitations No chest pain  No dizziness Patient has APS following at home  REVIEW OF SYSTEMS:    ROS  CONSTITUTIONAL: No documented fever. No fatigue, weakness. No weight gain, no weight loss.  EYES: No blurry or double vision.  ENT: No tinnitus. No postnasal drip. No redness of the oropharynx.  RESPIRATORY: No cough, no wheeze, no hemoptysis. Has dyspnea.  CARDIOVASCULAR: No chest pain. No orthopnea. Has palpitations. No syncope.  GASTROINTESTINAL: No nausea, no vomiting or diarrhea. No abdominal pain. No melena or hematochezia.  GENITOURINARY: No dysuria or hematuria.  ENDOCRINE: No polyuria or nocturia. No heat or cold intolerance.  HEMATOLOGY: No anemia. No bruising. No bleeding.  INTEGUMENTARY: No rashes. No lesions.  MUSCULOSKELETAL: No arthritis. No swelling. No gout.  NEUROLOGIC: No numbness, tingling, or ataxia. No seizure-type activity.  PSYCHIATRIC: No anxiety. No insomnia. No ADD.   DRUG ALLERGIES:   Allergies  Allergen Reactions  . Etodolac     Other reaction(s): Unknown  . Sulfa Antibiotics   . Tetracycline     VITALS:  Blood pressure (!) 107/57, pulse 70, temperature 97.9 F (36.6 C), temperature source Oral, resp. rate 18, height 5\' 1"  (1.549 m), weight 50.6 kg (111 lb 8 oz), SpO2 97 %.  PHYSICAL EXAMINATION:   Physical Exam  GENERAL:  72 y.o.-year-old patient lying in the bed with no acute distress.  EYES: Pupils equal, round, reactive to light and accommodation. No scleral icterus. Extraocular muscles intact.  HEENT: Head atraumatic, normocephalic. Oropharynx and nasopharynx clear.  NECK:  Supple, no jugular venous distention. No thyroid enlargement, no tenderness.  LUNGS: Improved breath sounds bilaterally,  bibasilar crepitations improved. No use of accessory muscles of respiration.  CARDIOVASCULAR: S1, S2 irregular. No murmurs, rubs, or gallops.  ABDOMEN: Soft, nontender, nondistended. Bowel sounds present. No organomegaly or mass.  EXTREMITIES: No cyanosis, clubbing or edema b/l.    NEUROLOGIC: Cranial nerves II through XII are intact. No focal Motor or sensory deficits b/l.   PSYCHIATRIC: The patient is alert and oriented x 3.  SKIN: No obvious rash, lesion, or ulcer.   LABORATORY PANEL:   CBC Recent Labs  Lab 11/16/17 0516  WBC 8.5  HGB 11.7*  HCT 34.4*  PLT 178   ------------------------------------------------------------------------------------------------------------------ Chemistries  Recent Labs  Lab 11/15/17 2243  11/18/17 0525  NA 134*   < > 139  K 3.0*   < > 3.9  CL 102   < > 111  CO2 21*   < > 23  GLUCOSE 119*   < > 91  BUN 12   < > 10  CREATININE 0.78   < > 0.69  CALCIUM 8.2*   < > 7.8*  MG  --    < > 2.4  AST 45*  --   --   ALT 21  --   --   ALKPHOS 63  --   --   BILITOT 2.2*  --   --    < > = values in this interval not displayed.   ------------------------------------------------------------------------------------------------------------------  Cardiac Enzymes Recent Labs  Lab 11/16/17 1634  TROPONINI 0.10*   ------------------------------------------------------------------------------------------------------------------  RADIOLOGY:  No results found.   ASSESSMENT AND PLAN:  72 year old female patient with history of coronary artery disease,  combined systolic and diastolic heart failure, hypertension, hyperlipidemia currently under hospitalist service for heart failure exacerbation.  1 decompensated heart failure.  Blood pressure is borderline so diuretics on hold Troponins were elevated secondary to demand ischemia CHF exacerbation secondary to pneumonia Echo - Left ventricle: The cavity size was moderately dilated. Systolic   function  was severely reduced. The estimated ejection fraction   was in the range of 20% to 25%. - Aortic valve: There was moderate stenosis. Valve area (VTI): 0.84   cm^2. Valve area (Vmax): 1 cm^2. Valve area (Vmean): 1.01 cm^2. - Mitral valve: There was moderate regurgitation. Valve area by   continuity equation (using LVOT flow): 1.25 cm^2. - Left atrium: The atrium was mildly dilated.   2 atrial fibrillation with rapid rate.  Off amiodarone drip Continue oral amiodarone Cardiology follow-up Rete better controlled  3.  Right lung pneumonia improving Continue Rocephin and Zithromax antibiotics  4.  Coronary artery disease Continue aspirin and statin Elevated troponin secondary to demand ischemia  5.  Monitor electrolytes  6.  DVT prophylaxis subcu Lovenox daily  7.  Physical therapy evaluation for gait and balance training   All the records are reviewed and case discussed with Care Management/Social Worker. Management plans discussed with the patient, family and they are in agreement.  CODE STATUS: Full code  DVT Prophylaxis: SCDs  TOTAL TIME TAKING CARE OF THIS PATIENT: 36 minutes.   POSSIBLE D/C IN 1 to 2 DAYS, DEPENDING ON CLINICAL CONDITION.  Saundra Shelling M.D on 11/18/2017 at 10:36 AM  Between 7am to 6pm - Pager - [phone]  After 6pm go to [email or web address] - password [email or web address] [facility]  [facility] [place] [facility]  Office  [phone]  CC: Primary care physician; Birdie Sons, MD  Note: This dictation was prepared with Dragon dictation along with smaller phrase technology. Any transcriptional errors that result from this process are unintentional.

## 2017-11-18 NOTE — Progress Notes (Signed)
There was no overnight nursing assessment charted on this patient. This is the second of my patients today with no overnight head to toe assessment charted. Working on putting my dayshift assessment in the computer now. Wenda Low [facility]

## 2017-11-19 MED ORDER — GUAIFENESIN ER 600 MG PO TB12
600.0000 mg | ORAL_TABLET | Freq: Two times a day (BID) | ORAL | Status: DC
  Administered 2017-11-19 – 2017-11-21 (×5): 600 mg via ORAL
  Filled 2017-11-19 (×5): qty 1

## 2017-11-19 MED ORDER — AMOXICILLIN-POT CLAVULANATE 875-125 MG PO TABS
1.0000 | ORAL_TABLET | Freq: Two times a day (BID) | ORAL | Status: DC
  Administered 2017-11-19 – 2017-11-21 (×4): 1 via ORAL
  Filled 2017-11-19 (×5): qty 1

## 2017-11-19 MED ORDER — FUROSEMIDE 20 MG PO TABS
20.0000 mg | ORAL_TABLET | Freq: Every day | ORAL | Status: DC
  Administered 2017-11-19 – 2017-11-21 (×3): 20 mg via ORAL
  Filled 2017-11-19 (×3): qty 1

## 2017-11-19 NOTE — Progress Notes (Signed)
[place] at [place] NAME: Tina Salinas    MR#:  [id number]  DATE OF BIRTH:  May 16, 1946  SUBJECTIVE:  Patient seen and evaluated today Has decreased shortness of breath and weaned off oxygen Has no palpitations No chest pain  Has difficulty hearing in the left ear secondary to wax Complains of chest congestion No dizziness  REVIEW OF SYSTEMS:    ROS  CONSTITUTIONAL: No documented fever. No fatigue, weakness. No weight gain, no weight loss.  EYES: No blurry or double vision.  ENT: No tinnitus. No postnasal drip. No redness of the oropharynx.  RESPIRATORY: No cough, no wheeze, no hemoptysis. Has dyspnea.  CARDIOVASCULAR: No chest pain. No orthopnea. Has palpitations. No syncope.  GASTROINTESTINAL: No nausea, no vomiting or diarrhea. No abdominal pain. No melena or hematochezia.  GENITOURINARY: No dysuria or hematuria.  ENDOCRINE: No polyuria or nocturia. No heat or cold intolerance.  HEMATOLOGY: No anemia. No bruising. No bleeding.  INTEGUMENTARY: No rashes. No lesions.  MUSCULOSKELETAL: No arthritis. No swelling. No gout.  NEUROLOGIC: No numbness, tingling, or ataxia. No seizure-type activity.  PSYCHIATRIC: No anxiety. No insomnia. No ADD.   DRUG ALLERGIES:   Allergies  Allergen Reactions  . Etodolac     Other reaction(s): Unknown  . Sulfa Antibiotics   . Tetracycline     VITALS:  Blood pressure 115/64, pulse 80, temperature 98.3 F (36.8 C), resp. rate 18, height 5\' 1"  (1.549 m), weight 50.2 kg (110 lb 11.2 oz), SpO2 98 %.  PHYSICAL EXAMINATION:   Physical Exam  GENERAL:  72 y.o.-year-old patient lying in the bed with no acute distress.  EYES: Pupils equal, round, reactive to light and accommodation. No scleral icterus. Extraocular muscles intact.  HEENT: Head atraumatic, normocephalic. Oropharynx and nasopharynx clear.  NECK:  Supple, no jugular venous distention. No thyroid enlargement, no tenderness.  LUNGS: Improved  breath sounds bilaterally, Decreased bibasilar crepitations heard. No use of accessory muscles of respiration.  CARDIOVASCULAR: S1, S2 irregular. No murmurs, rubs, or gallops.  ABDOMEN: Soft, nontender, nondistended. Bowel sounds present. No organomegaly or mass.  EXTREMITIES: No cyanosis, clubbing or edema b/l.    NEUROLOGIC: Cranial nerves II through XII are intact. No focal Motor or sensory deficits b/l.   PSYCHIATRIC: The patient is alert and oriented x 3.  SKIN: No obvious rash, lesion, or ulcer.   LABORATORY PANEL:   CBC Recent Labs  Lab 11/16/17 0516  WBC 8.5  HGB 11.7*  HCT 34.4*  PLT 178   ------------------------------------------------------------------------------------------------------------------ Chemistries  Recent Labs  Lab 11/15/17 2243  11/18/17 0525  NA 134*   < > 139  K 3.0*   < > 3.9  CL 102   < > 111  CO2 21*   < > 23  GLUCOSE 119*   < > 91  BUN 12   < > 10  CREATININE 0.78   < > 0.69  CALCIUM 8.2*   < > 7.8*  MG  --    < > 2.4  AST 45*  --   --   ALT 21  --   --   ALKPHOS 63  --   --   BILITOT 2.2*  --   --    < > = values in this interval not displayed.   ------------------------------------------------------------------------------------------------------------------  Cardiac Enzymes Recent Labs  Lab 11/16/17 1634  TROPONINI 0.10*   ------------------------------------------------------------------------------------------------------------------  RADIOLOGY:  No results found.   ASSESSMENT AND PLAN:  72 year old female patient with  history of coronary artery disease, combined systolic and diastolic heart failure, hypertension, hyperlipidemia currently under hospitalist service for heart failure exacerbation.  1 acute on chronic systolic heart failure Resume diuretics at low-dose if ok with cardiology Troponins were elevated secondary to demand ischemia CHF exacerbation secondary to pneumonia Echo - Left ventricle: The cavity size  was moderately dilated. Systolic   function was severely reduced. The estimated ejection fraction   was in the range of 20% to 25%. - Aortic valve: There was moderate stenosis. Valve area (VTI): 0.84   cm^2. Valve area (Vmax): 1 cm^2. Valve area (Vmean): 1.01 cm^2. - Mitral valve: There was moderate regurgitation. Valve area by   continuity equation (using LVOT flow): 1.25 cm^2. - Left atrium: The atrium was mildly dilated.   2 atrial fibrillation with rapid rate.  Off amiodarone drip Continue oral amiodarone for rate control Cardiology follow-up appreciated  3.  Right lung pneumonia improved Disontinue Rocephin and Zithromax antibiotics Start oral Augmentin Start oral decongestant that is Mucinex  4.  Coronary artery disease Continue aspirin and statin  5.  Monitor electrolytes  6.  DVT prophylaxis subcu Lovenox daily  7.  Discontinue Foley catheter Ambulation recommended  8.  I appreciate physical therapy evaluation  9.  Disposition Home with home health services and physical therapy in a.m.  All the records are reviewed and case discussed with Care Management/Social Worker. Management plans discussed with the patient, family and they are in agreement.  CODE STATUS: Full code  DVT Prophylaxis: SCDs  TOTAL TIME TAKING CARE OF THIS PATIENT: 35 minutes.   POSSIBLE D/C IN 1 DAYS, DEPENDING ON CLINICAL CONDITION.  Saundra Shelling M.D on 11/19/2017 at 2:24 PM  Between 7am to 6pm - Pager - [phone]  After 6pm go to [email or web address] - password [email or web address] [facility]  [facility] [place] [facility]  Office  [phone]  CC: Primary care physician; Birdie Sons, MD  Note: This dictation was prepared with Dragon dictation along with smaller phrase technology. Any transcriptional errors that result from this process are unintentional.

## 2017-11-19 NOTE — Plan of Care (Signed)

## 2017-11-19 NOTE — Care Management (Signed)
Attempted to speak with patient regarding discharge but she had to go to the bathroom and refused to discuss anything until she went

## 2017-11-19 NOTE — Care Management Important Message (Signed)
Copy of signed IM left in patient's room.    

## 2017-11-19 NOTE — Evaluation (Signed)
Occupational Therapy Evaluation Patient Details Name: Tina Salinas MRN: [id number] DOB: 09-04-1945 Today's Date: 11/19/2017    History of Present Illness 72yo female pt presented to ER secondary to cough, SOB; admitted with acute/chronic CHF (EF 20%), CAP.  Hospital course complicated by afib with RVR (managed with IV, now transitioned to PO, amioderone); ruled out for ACS.   Clinical Impression   Pt seen for OT evaluation this date. Pt was independent in all ADLs and modified independent using a RW for mobility, driving, and living in a 1 story home with ramped entrance. Pt reports becoming fatigued or out of breath with minimize exertion, endorses frequent falls within past 1 week of onset of illness. Pt currently requires minimal assist for LB ADL and CGA for transfers due to poor activity tolerance and mild safety awareness deficits. Pt states "I'm not moving anything in my home. I've got everything where I want it for the past 30 years." Pt educated in energy conservation conservation strategies and falls prevention strategies with a handout provided. Pt unable to verbalize reasons for or possible contributors to recent falls other than due to head congestion. Pt would benefit from additional skilled OT services to maximize problem solving and carryover of learned techniques and facilitate implementation of learned techniques into daily routines. Upon discharge, recommend [facility] services.      Follow Up Recommendations  Home health OT;Supervision - Intermittent    Equipment Recommendations  None recommended by OT    Recommendations for Other Services       Precautions / Restrictions Precautions Precautions: Fall Restrictions Weight Bearing Restrictions: No      Mobility Bed Mobility               General bed mobility comments: deferred, up in recliner   Transfers Overall transfer level: Needs assistance Equipment used: Rolling walker (2 wheeled) Transfers: Sit to/from  Stand Sit to Stand: Min guard         General transfer comment: additional time/effort to perform, heavy reliance on BUE    Balance Overall balance assessment: Needs assistance Sitting-balance support: No upper extremity supported;Feet supported Sitting balance-Leahy Scale: Good     Standing balance support: Bilateral upper extremity supported Standing balance-Leahy Scale: Fair                             ADL either performed or assessed with clinical judgement   ADL Overall ADL's : Needs assistance/impaired Eating/Feeding: Sitting;Independent   Grooming: Sitting;Independent   Upper Body Bathing: Sitting;Supervision/ safety;Set up   Lower Body Bathing: Sit to/from stand;Minimal assistance   Upper Body Dressing : Sitting;Supervision/safety;Set up   Lower Body Dressing: Sit to/from stand;Minimal assistance   Toilet Transfer: RW;Ambulation;BSC   Toileting- Clothing Manipulation and Hygiene: Sitting/lateral lean;Independent       Functional mobility during ADLs: Min guard;Rolling walker       Vision Baseline Vision/History: Wears glasses Wears Glasses: At all times Patient Visual Report: No change from baseline Vision Assessment?: No apparent visual deficits     Perception     Praxis      Pertinent Vitals/Pain Pain Assessment: No/denies pain(reports "not that much", just "aches" and her back is "sore" from when she fell into the tub)     Hand Dominance Right   Extremity/Trunk Assessment Upper Extremity Assessment Upper Extremity Assessment: Overall WFL for tasks assessed   Lower Extremity Assessment Lower Extremity Assessment: Generalized weakness       Communication  Communication Communication: HOH(L < R pt reports congestion in ears has worsened her HOH)   Cognition Arousal/Alertness: Awake/alert Behavior During Therapy: WFL for tasks assessed/performed Overall Cognitive Status: Within Functional Limits for tasks assessed                                      General Comments       Exercises Other Exercises Other Exercises: Pt educated in energy conservation strategies including with handout provided in order to minimize fatigue, flare up of arthritis pain, minimize falls risk, and minimize caregiver burden.    Shoulder Instructions      Home Living Family/patient expects to be discharged to:: Private residence Living Arrangements: Alone Available Help at Discharge: Friend(s);Available PRN/intermittently Type of Home: House Home Access: Ramped entrance     Home Layout: One level     Bathroom Shower/Tub: Teacher, early years/pre: Standard     Home Equipment: Environmental consultant - 2 wheels;Tub bench(3WRW)          Prior Functioning/Environment Level of Independence: Independent with assistive device(s)        Comments: Mod indep with ADLs, household and limited community mobilization with RW; denies fall history in previous six months (though week immediately prior to admission, reports daily falls related to this illness). +drives        OT Problem List: Cardiopulmonary status limiting activity;Decreased activity tolerance;Impaired balance (sitting and/or standing);Decreased strength;Decreased knowledge of use of DME or AE;Decreased safety awareness      OT Treatment/Interventions: Self-care/ADL training;Balance training;Therapeutic exercise;Therapeutic activities;Energy conservation;DME and/or AE instruction;Patient/family education    OT Goals(Current goals can be found in the care plan section) Acute Rehab OT Goals Patient Stated Goal: I want to go home! OT Goal Formulation: With patient Time For Goal Achievement: 12/03/17 Potential to Achieve Goals: Good ADL Goals Additional ADL Goal #1: Pt will verbalize plan to implement at least 1 learned energy conservation strategy into her daily routine to maximize safety and independence. Additional ADL Goal #2: Pt will trial use of  toileting schedule and/or use of incontinence products and verbalize plan for implementing into daily routine in order to improve self management of incontinence and minimize risk of falls or caregiver burden.  OT Frequency: Min 1X/week   Barriers to D/C:            Co-evaluation              AM-PAC PT "6 Clicks" Daily Activity     Outcome Measure Help from another person eating meals?: None Help from another person taking care of personal grooming?: A Little Help from another person toileting, which includes using toliet, bedpan, or urinal?: A Little Help from another person bathing (including washing, rinsing, drying)?: A Little Help from another person to put on and taking off regular upper body clothing?: A Little Help from another person to put on and taking off regular lower body clothing?: A Little 6 Click Score: 19   End of Session    Activity Tolerance: Patient tolerated treatment well Patient left: in chair;with call bell/phone within reach;with family/visitor present  OT Visit Diagnosis: Other abnormalities of gait and mobility (R26.89)                Time: 1610-9604 OT Time Calculation (min): 22 min Charges:  OT General Charges $OT Visit: 1 Visit OT Evaluation $OT Eval Low Complexity: 1 Low OT Treatments $Self  Care/Home Management : 8-22 mins  Jeni Salles, MPH, MS, OTR/L ascom [phone] 11/19/17, 3:15 PM

## 2017-11-20 NOTE — Progress Notes (Signed)
[facility] NAME: Tina Salinas    MR#:  [id number]  DATE OF BIRTH:  1946/05/22  SUBJECTIVE:  Patient seen and evaluated today Has been weaned off oxygen Has no palpitations No chest pain  Has difficulty hearing in the left ear secondary to wax Decreased chest congestion No dizziness  REVIEW OF SYSTEMS:    ROS  CONSTITUTIONAL: No documented fever. No fatigue, weakness. No weight gain, no weight loss.  EYES: No blurry or double vision.  ENT: No tinnitus. No postnasal drip. No redness of the oropharynx.  RESPIRATORY: No cough, no wheeze, no hemoptysis. Has dyspnea.  CARDIOVASCULAR: No chest pain. No orthopnea. Has palpitations. No syncope.  GASTROINTESTINAL: No nausea, no vomiting or diarrhea. No abdominal pain. No melena or hematochezia.  GENITOURINARY: No dysuria or hematuria.  ENDOCRINE: No polyuria or nocturia. No heat or cold intolerance.  HEMATOLOGY: No anemia. No bruising. No bleeding.  INTEGUMENTARY: No rashes. No lesions.  MUSCULOSKELETAL: No arthritis. No swelling. No gout.  NEUROLOGIC: No numbness, tingling, or ataxia. No seizure-type activity.  PSYCHIATRIC: No anxiety. No insomnia. No ADD.   DRUG ALLERGIES:   Allergies  Allergen Reactions  . Etodolac     Other reaction(s): Unknown  . Sulfa Antibiotics   . Tetracycline     VITALS:  Blood pressure (!) 108/58, pulse 84, temperature 99.5 F (37.5 C), temperature source Oral, resp. rate 18, height 5\' 1"  (1.549 m), weight 48.4 kg (106 lb 9.6 oz), SpO2 96 %.  PHYSICAL EXAMINATION:   Physical Exam  GENERAL:  72 y.o.-year-old patient lying in the bed with no acute distress.  EYES: Pupils equal, round, reactive to light and accommodation. No scleral icterus. Extraocular muscles intact.  HEENT: Head atraumatic, normocephalic. Oropharynx and nasopharynx clear.  NECK:  Supple, no jugular venous distention. No thyroid enlargement, no tenderness.  LUNGS: Improved breath  sounds bilaterally, No crepitations heard. No use of accessory muscles of respiration.  CARDIOVASCULAR: S1, S2 irregular. No murmurs, rubs, or gallops.  ABDOMEN: Soft, nontender, nondistended. Bowel sounds present. No organomegaly or mass.  EXTREMITIES: No cyanosis, clubbing or edema b/l.    NEUROLOGIC: Cranial nerves II through XII are intact. No focal Motor or sensory deficits b/l.   PSYCHIATRIC: The patient is alert and oriented x 3.  SKIN: No obvious rash, lesion, or ulcer.   LABORATORY PANEL:   CBC Recent Labs  Lab 11/16/17 0516  WBC 8.5  HGB 11.7*  HCT 34.4*  PLT 178   ------------------------------------------------------------------------------------------------------------------ Chemistries  Recent Labs  Lab 11/15/17 2243  11/18/17 0525  NA 134*   < > 139  K 3.0*   < > 3.9  CL 102   < > 111  CO2 21*   < > 23  GLUCOSE 119*   < > 91  BUN 12   < > 10  CREATININE 0.78   < > 0.69  CALCIUM 8.2*   < > 7.8*  MG  --    < > 2.4  AST 45*  --   --   ALT 21  --   --   ALKPHOS 63  --   --   BILITOT 2.2*  --   --    < > = values in this interval not displayed.   ------------------------------------------------------------------------------------------------------------------  Cardiac Enzymes Recent Labs  Lab 11/16/17 1634  TROPONINI 0.10*   ------------------------------------------------------------------------------------------------------------------  RADIOLOGY:  No results found.   ASSESSMENT AND PLAN:  72 year old female patient with history of  coronary artery disease, combined systolic and diastolic heart failure, hypertension, hyperlipidemia currently under hospitalist service for heart failure exacerbation.  1 acute on chronic systolic heart failure Resumed diuretics at low-dose  Troponins were elevated secondary to demand ischemia CHF exacerbation secondary to pneumonia Echo - Left ventricle: The cavity size was moderately dilated. Systolic   function  was severely reduced. The estimated ejection fraction   was in the range of 20% to 25%. - Aortic valve: There was moderate stenosis. Valve area (VTI): 0.84   cm^2. Valve area (Vmax): 1 cm^2. Valve area (Vmean): 1.01 cm^2. - Mitral valve: There was moderate regurgitation. Valve area by   continuity equation (using LVOT flow): 1.25 cm^2. - Left atrium: The atrium was mildly dilated.   2 atrial fibrillation with rapid rate.  Off amiodarone drip Continue oral amiodarone for rate control Cardiology follow-up appreciated  3.  Right lung pneumonia improved Disontinued Rocephin and Zithromax antibiotics continue oral Augmentin Start oral decongestant that is Mucinex  4.  Coronary artery disease Continue aspirin and statin  5.  Monitor electrolytes  6.  DVT prophylaxis subcu Lovenox daily  7.  Discontinue Foley catheter Ambulation recommended  8.  I appreciate physical therapy evaluation  9.  Disposition Patient says she lives alone unable to take care of herself at home  And also was able to talk to her friends today Has no family Patient opts for rehab placement Social worker consultation done for rehab placement  All the records are reviewed and case discussed with Care Management/Social Worker. Management plans discussed with the patient, family and they are in agreement.  CODE STATUS: Full code  DVT Prophylaxis: SCDs  TOTAL TIME TAKING CARE OF THIS PATIENT: 35 minutes.   POSSIBLE D/C IN 1 DAYS, DEPENDING ON CLINICAL CONDITION.  Saundra Shelling M.D on 11/20/2017 at 3:07 PM  Between 7am to 6pm - Pager - [phone]  After 6pm go to [email or web address] - password [email or web address]  [facility]  Office  [phone]  CC: Primary care physician; Birdie Sons, MD  Note: This dictation was prepared with Dragon dictation along with smaller phrase technology. Any transcriptional errors that result from this process are unintentional.

## 2017-11-20 NOTE — Progress Notes (Signed)
Patient resting in the chair alert and oriented, family at bedside, patient ambulate with PT, no distress noted

## 2017-11-20 NOTE — Progress Notes (Signed)
Occupational Therapy Treatment Patient Details Name: Tina Salinas MRN: [id number] DOB: [birth year] Today's Date: 11/20/2017    History of present illness 72yo female pt presented to ER secondary to cough, SOB; admitted with acute/chronic CHF (EF 20%), CAP.  Hospital course complicated by afib with RVR (managed with IV, now transitioned to PO, amioderone); ruled out for ACS.   OT comments  Pt seen for OT treatment this date. Education/instruction provided in energy conservation strategies, AE/DME, home/routines modifications and falls prevention strategies to maximize safety and functional independence while minimizing falls risk and need for caregiver assist/burden. Pt educated in urinary incontinence/bladder mgt strategies including her current use of washable chuck pads on her bed while also trialing use of depends, depends with overnight pad, or regular undergarments with use of overnight pad, with the goal to minimize need to rush to the bathroom overnight or in the morning. Pt endorses having "slipped out of bed" several times in the past and she contributes this to the "slippery" nature of the chuck pads. Pt notes she does not use depends because they are expensive and frequently does not wear underwear. Educated pt on energy use and time use associated with changing soiled/wet chuck pads versus decreased effort/energy spent to change pad or undergarments if she does have a bladder accident. Also discussed the risk of injury due to falls when needing to rush to the bathroom. Discussed use of BSC beside bed overnight. Pt verbalized that this may not work because she doesn't have anyone to help her clean out the [facility] after use. Educated pt in use of bed rail (secured around box spring of bed) to improve pt's independence and safety with bed mobility and with bed transfers. Pt verbalized being very interested in this to help improve her safety. She reports finances being a barrier to using disposable  undergarments and was willing to consider use of overnight pads for added protection. Pt demonstrated mildly improved insight into current deficits and safety awareness today. Endorses that she is not at her baseline functional level and is unable to safely care for herself at home at this time. Based on pt's current functional mobility, safety, ability to perform ADL safely, no reliable or consistent assist available to her when home, and current very high falls risk and risk for readmission, updating OT recommendation to STR to facilitate an ultimate safe return home.    Follow Up Recommendations  SNF    Equipment Recommendations  Other (comment)(bed rail, reacher, disposable undergarments and overnight pads)    Recommendations for Other Services      Precautions / Restrictions Precautions Precautions: Fall Restrictions Weight Bearing Restrictions: No       Mobility Bed Mobility               General bed mobility comments: deferred, up in recliner. Educated pt in benefits of a bed rail to improve safety and bed mobility to minimize risk of slipping OOB   Transfers Overall transfer level: Needs assistance Equipment used: Rolling walker (2 wheeled) Transfers: Sit to/from Stand Sit to Stand: Min assist         General transfer comment: educated in benefits of a bed rail to improve safety with transfers in/out of bed    Balance Overall balance assessment: Needs assistance Sitting-balance support: No upper extremity supported;Feet supported Sitting balance-Leahy Scale: Good     Standing balance support: Bilateral upper extremity supported Standing balance-Leahy Scale: Poor  ADL either performed or assessed with clinical judgement   ADL Overall ADL's : Needs assistance/impaired Eating/Feeding: Sitting;Independent   Grooming: Sitting;Independent Grooming Details (indicate cue type and reason): would require CGA if standing with  modified set up to minimize reaching or bending outside BOS Upper Body Bathing: Sitting;Supervision/ safety;Set up Upper Body Bathing Details (indicate cue type and reason): with rest breaks to minimize fatigue and coughing spells/SOB Lower Body Bathing: Sit to/from stand;Minimal assistance Lower Body Bathing Details (indicate cue type and reason): CGA in standing, with rest breaks to minimize fatigue and coughing spells/SOB Upper Body Dressing : Sitting;Supervision/safety;Set up   Lower Body Dressing: Sit to/from stand;Minimal assistance Lower Body Dressing Details (indicate cue type and reason): CGA once in standing, with rest breaks to minimize fatigue and coughing spells/SOB; educated in AE for LB dressing to minimize bending over and improve independence Toilet Transfer: Facilities manager Details (indicate cue type and reason): educated in benefits of having BSC beside bed overnight   Toileting - Clothing Manipulation Details (indicate cue type and reason): educated in benefits of depends, overnight pads in undergarments to minimize risk of leaks and minimize need to rush out of bed to get to bathroom overnight or in the morning. Pt endorses falling out of bed (slipping off bed) several times before due to need to rush to the bathroom for fear of leakage, however indicates she sometimes doesn't even wear underwear especially when she takes "piss pills" that "gives a little extra time to get to the bathroom". Educated in risk of falls due to rushing and modifications to toileting via environment/use of products/etc. to maximize safety while minimizing need to rush.     Functional mobility during ADLs: Min guard;Minimal assistance;Rolling walker       Vision Baseline Vision/History: Wears glasses Wears Glasses: At all times Patient Visual Report: No change from baseline Vision Assessment?: No apparent visual deficits   Perception     Praxis      Cognition  Arousal/Alertness: Awake/alert Behavior During Therapy: WFL for tasks assessed/performed Overall Cognitive Status: Within Functional Limits for tasks assessed                                          Exercises Other Exercises Other Exercises: Pt educated in DME/AE, home/routines modifications, incontinence/bladder mgt, falls prevention, and activity pacing to improve safety, independence, and minimize risk of falls and caregiver burden. Pt would benefit from additional practice to trial equipment and strategies and problem solve how to implement in the home.   Shoulder Instructions       General Comments      Pertinent Vitals/ Pain       Pain Assessment: No/denies pain  Home Living                                          Prior Functioning/Environment              Frequency  Min 1X/week        Progress Toward Goals  OT Goals(current goals can now be found in the care plan section)  Progress towards OT goals: OT to reassess next treatment  Acute Rehab OT Goals Patient Stated Goal: i want to go home safely OT Goal Formulation: With patient Time For Goal Achievement: 12/03/17 Potential  to Achieve Goals: Good  Plan Discharge plan needs to be updated;Frequency remains appropriate    Co-evaluation                 AM-PAC PT "6 Clicks" Daily Activity     Outcome Measure   Help from another person eating meals?: None Help from another person taking care of personal grooming?: A Little Help from another person toileting, which includes using toliet, bedpan, or urinal?: A Little Help from another person bathing (including washing, rinsing, drying)?: A Little Help from another person to put on and taking off regular upper body clothing?: A Little Help from another person to put on and taking off regular lower body clothing?: A Little 6 Click Score: 19    End of Session    OT Visit Diagnosis: Other abnormalities of gait and  mobility (R26.89)   Activity Tolerance Patient tolerated treatment well   Patient Left in chair;with call bell/phone within reach;with family/visitor present;with chair alarm set   Nurse Communication          Time: 5537-4827 OT Time Calculation (min): 59 min  Charges: OT General Charges $OT Visit: 1 Visit OT Treatments $Self Care/Home Management : 53-67 mins  Jeni Salles, MPH, MS, OTR/L ascom [phone] 11/20/17, 3:59 PM
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
## 2017-11-20 NOTE — Clinical Social Work Note (Signed)
Clinical Social Work Assessment  Patient Details  Name: Tina Salinas MRN: [id number] Date of Birth: 1945/08/07  Date of referral:  11/20/17               Reason for consult:  Facility Placement                Permission sought to share information with:  Facility Sport and exercise psychologist, Family Supports Permission granted to share information::  Yes, Verbal Permission Granted  Name::     Tina Salinas [phone]  [phone] or [name]   [phone]   Agency::  SNF admissions  Relationship::     Contact Information:     Housing/Transportation Living arrangements for the past 2 months:  [place] of Information:  Patient, Medical Team Patient Interpreter Needed:  None Criminal Activity/Legal Involvement Pertinent to Current Situation/Hospitalization:  No - Comment as needed Significant Relationships:  Friend Lives with:  Self Do you feel safe going back to the place where you live?  No Need for family participation in patient care:  No (Coment)  Care giving concerns:  Patient feels she need short term rehab before she is able to return back home.   Social Worker assessment / plan:  Patient is a 72 year old female who is alert and oriented x4 and hard of hearing.  Patient lives alone and also is being followed by APS due to a hoarding issue, APS worker is Rory Percy.  CSW explained role of CSW and process of trying to find placement for short term rehab.  Patient states she has been to [facility] several years ago, and was pleased with the care there.  Patient was reminded how insurance will pay for the stay and what the process is for getting her there.  Patient does not have any family, but she does have some friends that were at bedside.  Patient expressed she feels like she really is too weak to return back home, and needs to get rehab first.  Patient expressed understanding and did not have any other questions or concerns.  CSW was given  permission to begin bed search in [place].   Employment status:  Retired Nurse, adult PT Recommendations:  [place] / Referral to community resources:  [place]  Patient/Family's Response to care:  Patient is agreeable to going to SNF for short term rehab.  Patient/Family's Understanding of and Emotional Response to Diagnosis, Current Treatment, and Prognosis:  Patient expressed she does not feel comfortable returning back home and needs some rehab first before she goes back home.  Patient is hopeful that she will not have to be at [facility] for very long.  Emotional Assessment Appearance:  Appears stated age Attitude/Demeanor/Rapport:    Affect (typically observed):  Appropriate, Stable Orientation:  Oriented to Self, Oriented to Place, Oriented to  Time, Oriented to Situation Alcohol / Substance use:  Not Applicable Psych involvement (Current and /or in the community):  No (Comment)  Discharge Needs  Concerns to be addressed:  Lack of Support, Care Coordination, Mental Health Concerns Readmission within the last 30 days:  No Current discharge risk:  Lack of support system, Lives alone Barriers to Discharge:  Insurance Authorization   Anell Barr 11/20/2017, 12:26 PM

## 2017-11-20 NOTE — Clinical Social Work Note (Addendum)
PT has changed recommendation to SNF for short term rehab, CSW spoke with patient and her friends, and they are agreeable to having patient go to SNF for short term rehab before she is able to return back home.  APS is also following patient's progress and will be notified that patient is requesting SNF.  3:00pm  CSW presented bed offers to patient and her friends and they chose [facility].  CSW contacted [facility], and they can accept patient once insurance authorization has been approved.  Insurance authorization is still pending.  5:08pm CSW is still awaiting for insurance approval, CSW updated patient's friend [name] and patient is aware that CSW does not have insurance approval for patient to go to SNF yet.  Jones Broom. [place], MSW, [place]  11/20/2017 12:01 PM

## 2017-11-20 NOTE — Progress Notes (Signed)
Physical Therapy Treatment Patient Details Name: Tina Salinas MRN: [id number] DOB: 1946-03-11 Today's Date: 11/20/2017    History of Present Illness 72yo female pt presented to ER secondary to cough, SOB; admitted with acute/chronic CHF (EF 20%), CAP.  Hospital course complicated by afib with RVR (managed with IV, now transitioned to PO, amioderone); ruled out for ACS.    PT Comments    Pt in recliner, agrees to gait.  3 friends in room.  Pt was able to stand x 4 during session to walker with heavy reliance on walker for support.  3/4 attempts pt needed min assist to prevent falling to right without intervention. On first gait trial she was able to ambulate 99' before fatigue.  After rest, she was able to ambulate around unit.    While pt is progressing well with gait distances, balance remains grossly impaired.  Berg test given with score of 11/56  which in indicates high risk of falling.  Pt has little to no step height with gait and decreased step length bilaterally.  She lists to the right at times.  Pt has frequent bouts of coughing where she doubles over and requires assist to prevent falls.  She reports BUE fatigue and pain from fall but pushes herself with gait stating she needs to walk to get stronger.  Pt relies heavily on walker for support and is unable to do tasks with 1UE support safely.    Discussed pt with nursing, MD, OT and SWS regarding discharge plan.  While pt was strongly refusing SNF initially she is more aware of her limitations at this time and has not progressed with balance and safety as expected.  Due to pt's overall balance, safety, and general weakness pt would benefit from SNF upon discharge.  Friends in and also share concern and share that her mobility is not near baseline as pt has stated at evaluation.  OT will try to see pt again this pm to further assess safety at home and to review discharge recommendations.   Follow Up Recommendations  SNF     Equipment  Recommendations  Rolling walker with 5" wheels    Recommendations for Other Services       Precautions / Restrictions Precautions Precautions: Fall Restrictions Weight Bearing Restrictions: No    Mobility  Bed Mobility               General bed mobility comments: deferred, up in recliner   Transfers Overall transfer level: Needs assistance Equipment used: Rolling walker (2 wheeled) Transfers: Sit to/from Stand Sit to Stand: Min assist         General transfer comment: additional time/effort to perform, heavy reliance on BUE  Ambulation/Gait Ambulation/Gait assistance: Min guard;Min assist Ambulation Distance (Feet): 160 Feet Assistive device: Rolling walker (2 wheeled) Gait Pattern/deviations: Step-to pattern;Decreased step length - right;Decreased step length - left;Shuffle   Gait velocity interpretation: <1.8 ft/sec, indicate of risk for recurrent falls     Stairs             Wheelchair Mobility    Modified Rankin (Stroke Patients Only)       Balance Overall balance assessment: Needs assistance Sitting-balance support: No upper extremity supported;Feet supported Sitting balance-Leahy Scale: Good     Standing balance support: Bilateral upper extremity supported Standing balance-Leahy Scale: Poor                   Standardized Balance Assessment Standardized Balance Assessment : Oceanographer Test [employer]  Test Sit to Stand: Able to stand  independently using hands Standing Unsupported: Unable to stand 30 seconds unassisted Sitting with Back Unsupported but Feet Supported on Floor or Stool: Able to sit safely and securely 2 minutes Stand to Sit: Controls descent by using hands Transfers: Needs one person to assist Standing Unsupported with Eyes Closed: Needs help to keep from falling Standing Ubsupported with Feet Together: Needs help to attain position and unable to hold for 15 seconds From Standing, Reach Forward with  Outstretched Arm: Loses balance while trying/requires external support From Standing Position, Pick up Object from Floor: Unable to try/needs assist to keep balance From Standing Position, Turn to Look Behind Over each Shoulder: Needs assist to keep from losing balance and falling Turn 360 Degrees: Needs assistance while turning Standing Unsupported, Alternately Place Feet on Step/Stool: Needs assistance to keep from falling or unable to try Standing Unsupported, One Foot in Front: Loses balance while stepping or standing Standing on One Leg: Unable to try or needs assist to prevent fall Total Score: 11        Cognition Arousal/Alertness: Awake/alert Behavior During Therapy: WFL for tasks assessed/performed Overall Cognitive Status: Within Functional Limits for tasks assessed                                        Exercises      General Comments       Pertinent Vitals/Pain Pain Assessment: No/denies pain    Home Living                      Prior Function            PT Goals (current goals can now be found in the care plan section) Progress towards PT goals: Progressing toward goals    Frequency    Min 2X/week      PT Plan Discharge plan needs to be updated    Co-evaluation              AM-PAC PT "6 Clicks" Daily Activity  Outcome Measure  Difficulty turning over in bed (including adjusting bedclothes, sheets and blankets)?: Unable Difficulty moving from lying on back to sitting on the side of the bed? : Unable Difficulty sitting down on and standing up from a chair with arms (e.g., wheelchair, bedside commode, etc,.)?: Unable Help needed moving to and from a bed to chair (including a wheelchair)?: A Little Help needed walking in hospital room?: A Little Help needed climbing 3-5 steps with a railing? : A Lot 6 Click Score: 11    End of Session Equipment Utilized During Treatment: Gait belt Activity Tolerance: Patient  tolerated treatment well Patient left: in chair;with chair alarm set;with call bell/phone within reach;with family/visitor present         Time: 8309-4076 PT Time Calculation (min) (ACUTE ONLY): 46 min  Charges:  $Gait Training: 23-37 mins $Therapeutic Activity: 8-22 mins                    G Codes:       Chesley Noon, PTA 11/20/17, 11:33 AM

## 2017-11-21 MED ORDER — LOPERAMIDE HCL 2 MG PO CAPS
4.0000 mg | ORAL_CAPSULE | Freq: Once | ORAL | Status: AC
  Administered 2017-11-21: 4 mg via ORAL
  Filled 2017-11-21: qty 2

## 2017-11-21 MED ORDER — GUAIFENESIN ER 600 MG PO TB12: 600.0000 mg | ORAL_TABLET | Freq: Two times a day (BID) | ORAL | 0 refills | Status: DC

## 2017-11-21 MED ORDER — AMOXICILLIN-POT CLAVULANATE 875-125 MG PO TABS: 1.0000 | ORAL_TABLET | Freq: Two times a day (BID) | ORAL | 0 refills | Status: DC

## 2017-11-21 MED ORDER — BENZONATATE 100 MG PO CAPS: 100.0000 mg | ORAL_CAPSULE | Freq: Three times a day (TID) | ORAL | 0 refills | Status: DC | PRN

## 2017-11-21 MED ORDER — HYDROCOD POLST-CPM POLST ER 10-8 MG/5ML PO SUER: 5.0000 mL | Freq: Two times a day (BID) | ORAL | 0 refills | Status: DC | PRN

## 2017-11-21 MED ORDER — AMIODARONE HCL 400 MG PO TABS: 400.0000 mg | ORAL_TABLET | Freq: Two times a day (BID) | ORAL | 0 refills | Status: DC

## 2017-11-21 MED ORDER — FUROSEMIDE 20 MG PO TABS: 20.0000 mg | ORAL_TABLET | Freq: Every day | ORAL | 1 refills | Status: DC

## 2017-11-21 NOTE — Care Management Important Message (Signed)
Copy of signed IM left in patient's room.    

## 2017-11-21 NOTE — Discharge Summary (Signed)
[place] [facility] [place] NAME: Tina Salinas    MR#:  [id number]  DATE OF BIRTH:  13-Jul-1946  DATE OF ADMISSION:  11/15/2017 ADMITTING PHYSICIAN: Lance Coon, MD  DATE OF DISCHARGE: No discharge date for patient encounter.  PRIMARY CARE PHYSICIAN: Birdie Sons, MD   ADMISSION DIAGNOSIS:  Confusion [R41.0] Atypical pneumonia [J18.9] Elevated troponin [R74.8] Generalized weakness [R53.1]  DISCHARGE DIAGNOSIS:  Principal Problem:   Acute on chronic combined systolic and diastolic CHF (congestive heart failure) (HCC) Active Problems:   Essential (primary) hypertension   Hypercholesteremia   Rheumatoid arthritis with rheumatoid factor (HCC)   Elevated troponin   CAP (community acquired pneumonia)   A-fib (HCC)   Malnutrition of moderate degree Pneumonia Community Acquired Atrial Fibrillation  SECONDARY DIAGNOSIS:   Past Medical History:  Diagnosis Date  . CAD (coronary artery disease)   . Chronic combined systolic and diastolic CHF (congestive heart failure) ([place])   . HTN (hypertension)   . Hyperlipidemia      ADMITTING HISTORY Tina Salinas  is a 72 y.o. female who presents with cough, shortness of breath, malaise.  Patient states she has had some sputum production, yellow in color.  Here in the ED work-up is consistent with heart failure exacerbation, and potentially community acquired pneumonia.  Hospitalist were called for admission  HOSPITAL COURSE:  72 year old female patient with history of coronary artery disease, combined systolic diastolic heart failure, hypertension, hyperlipidemia was admitted on 11/25/2017 for heart failure exacerbation and community-acquired pneumonia.  Patient was admitted to telemetry.  She was treated with IV Rocephin and Zithromax antibiotics.  Patient had borderline blood pressure and diuretics could not be given initially.  Her heart failure exacerbation was secondary to pneumonia.  Patient had elevated  troponin secondary to demand ischemia.  She was seen by cardiology attending Dr. Ubaldo Glassing during the hospitalization.  Patient had atrial fibrillation with rapid rate during her stay in the hospital for which she received IV amiodarone drip.  Once her heart rate was controlled she was switched to oral amiodarone.  Patient tolerated IV antibiotics well and she was switched to oral Augmentin.  She received physical therapy for deconditioning during her stay in the hospital.  Her chest congestion, shortness of breath and hypoxia improved and she was weaned off oxygen.  Patient had ambulatory dysfunction and gait instability and received physical therapy.  Patient lives alone does not have any family able to take care of herself.  Social worker consultation was done for rehab placement.  Patient started on oral Lasix for diuresis continued oral amiodarone and switch to oral Augmentin.  Chest congestion, cough improved.  Her heart rate has been better controlled.  She will be discharged on oral aspirin, oral amiodarone and oral Augmentin.  Considering advanced age and high risk of falls anticoagulation deferred.  Patient hemodynamically stable [facility].  CONSULTS OBTAINED:  Treatment Team:  Teodoro Spray, MD  DRUG ALLERGIES:   Allergies  Allergen Reactions  . Etodolac     Other reaction(s): Unknown  . Sulfa Antibiotics   . Tetracycline     DISCHARGE MEDICATIONS:   Allergies as of 11/21/2017      Reactions   Etodolac    Other reaction(s): Unknown   Sulfa Antibiotics    Tetracycline       Medication List    STOP taking these medications   acyclovir 800 MG tablet Commonly known as:  ZOVIRAX   ALPRAZolam  0.5 MG tablet Commonly known as:  XANAX   azithromycin 250 MG tablet Commonly known as:  ZITHROMAX   carvedilol 3.125 MG tablet Commonly known as:  COREG   cyclobenzaprine 10 MG tablet Commonly known as:  FLEXERIL   HYDROcodone-acetaminophen 7.5-325  MG tablet Commonly known as:  NORCO   ibuprofen 800 MG tablet Commonly known as:  ADVIL,MOTRIN   promethazine 25 MG tablet Commonly known as:  PHENERGAN     TAKE these medications   alendronate 70 MG tablet Commonly known as:  FOSAMAX Take 70 mg by mouth once a week.   amiodarone 400 MG tablet Commonly known as:  PACERONE Take 1 tablet (400 mg total) by mouth 2 (two) times daily.   amoxicillin-clavulanate 875-125 MG tablet Commonly known as:  AUGMENTIN Take 1 tablet by mouth every 12 (twelve) hours for 5 days.   aspirin 81 MG tablet Take 81 mg by mouth daily.   benzonatate 100 MG capsule Commonly known as:  TESSALON Take 1 capsule (100 mg total) by mouth 3 (three) times daily as needed for cough.   chlorpheniramine-HYDROcodone 10-8 MG/5ML Suer Commonly known as:  TUSSIONEX Take 5 mLs by mouth every 12 (twelve) hours as needed for up to 7 days for cough.   cholecalciferol 1000 units tablet Commonly known as:  VITAMIN D Take 1,000 Units by mouth daily.   fluticasone 50 MCG/ACT nasal spray Commonly known as:  FLONASE Place 2 sprays into both nostrils daily.   furosemide 20 MG tablet Commonly known as:  LASIX Take 1 tablet (20 mg total) by mouth daily. Start taking on:  11/22/2017   GNP FIBER THERAPY PO Take 1 Dose by mouth daily as needed (constipation).   guaiFENesin 600 MG 12 hr tablet Commonly known as:  MUCINEX Take 1 tablet (600 mg total) by mouth 2 (two) times daily for 15 days.   hydroxychloroquine 200 MG tablet Commonly known as:  PLAQUENIL Take 400 mg by mouth daily.   Methotrexate (PF) 25 MG/0.5ML Soaj Inject 1.4 mLs into the skin once a week.   oxybutynin 5 MG tablet Commonly known as:  DITROPAN TAKE 1 TABLET TWICE DAILY AS NEEDED  FOR  BLADDER  SPASMS   pravastatin 40 MG tablet Commonly known as:  PRAVACHOL Take 1 tablet (40 mg total) by mouth at bedtime.   predniSONE 5 MG tablet Commonly known as:  DELTASONE Take 5 mg by mouth daily with  breakfast.   XALATAN 0.005 % ophthalmic solution Generic drug:  latanoprost Place 1 drop into both eyes at bedtime.       Today  Patient seen and evaluated today No cough and congestion Received physical therapy No chest pain No fever and chills  VITAL SIGNS:  Blood pressure 114/66, pulse 64, temperature 97.8 F (36.6 C), temperature source Oral, resp. rate 18, height 5\' 1"  (1.549 m), weight 49.1 kg (108 lb 4.8 oz), SpO2 100 %.  I/O:    Intake/Output Summary (Last 24 hours) at 11/21/2017 1127 Last data filed at 11/21/2017 0405 Gross per 24 hour  Intake 240 ml  Output 200 ml  Net 40 ml    PHYSICAL EXAMINATION:  Physical Exam  GENERAL:  72 y.o.-year-old patient lying in the bed with no acute distress.  LUNGS: Normal breath sounds bilaterally, no wheezing, rales,rhonchi or crepitation. No use of accessory muscles of respiration.  CARDIOVASCULAR: S1, S2 normal. No murmurs, rubs, or gallops.  ABDOMEN: Soft, non-tender, non-distended. Bowel sounds present. No organomegaly or mass.  NEUROLOGIC: Moves all  4 extremities. PSYCHIATRIC: The patient is alert and oriented x 3.  SKIN: No obvious rash, lesion, or ulcer.   DATA REVIEW:   CBC Recent Labs  Lab 11/16/17 0516  WBC 8.5  HGB 11.7*  HCT 34.4*  PLT 178    Chemistries  Recent Labs  Lab 11/15/17 2243  11/18/17 0525  NA 134*   < > 139  K 3.0*   < > 3.9  CL 102   < > 111  CO2 21*   < > 23  GLUCOSE 119*   < > 91  BUN 12   < > 10  CREATININE 0.78   < > 0.69  CALCIUM 8.2*   < > 7.8*  MG  --    < > 2.4  AST 45*  --   --   ALT 21  --   --   ALKPHOS 63  --   --   BILITOT 2.2*  --   --    < > = values in this interval not displayed.    Cardiac Enzymes Recent Labs  Lab 11/16/17 1634  TROPONINI 0.10*    Microbiology Results  No results found for this or any previous visit.  RADIOLOGY:  No results found.  Follow up with PCP in 1 week.  Management plans discussed with the patient, family and they are in  agreement.  CODE STATUS:     Code Status Orders  (From admission, onward)        Start     Ordered   11/16/17 0419  Full code  Continuous     11/16/17 0418    Code Status History    Date Active Date Inactive Code Status Order ID Comments User Context   05/16/2017 0938 05/16/2017 1504 Full Code [id number]  Teodoro Spray, MD Inpatient      TOTAL TIME TAKING CARE OF THIS PATIENT ON DAY OF DISCHARGE: more than 34 minutes.   Saundra Shelling M.D on 11/21/2017 at 11:27 AM  Between 7am to 6pm - Pager - [phone]  After 6pm go to [email or web address] - password [email or web address] [facility]  [facility] [place] [facility]  Office  [phone]  CC: Primary care physician; Birdie Sons, MD  Note: This dictation was prepared with Dragon dictation along with smaller phrase technology. Any transcriptional errors that result from this process are unintentional.

## 2017-11-21 NOTE — Clinical Social Work Note (Addendum)
CSW received insurance approval for patient to go to SNF for short term rehab.  Patient will be going to [facility], facility is aware and can accept patient today.  Patient to be d/c'ed today to [facility] room 711.  Patient and family agreeable to plans will transport via ems RN to call report to [place] hall nurse, [phone].  CSW left message on her friend Lynn's voice mail [phone] and also left message on voice mail for APS worker Rory Percy who is following patient's case.  Evette Cristal, MSW, [place]

## 2017-11-21 NOTE — Progress Notes (Signed)
Physical Therapy Treatment Patient Details Name: Tina Salinas MRN: [id number] DOB: October 06, 1945 Today's Date: 11/21/2017    History of Present Illness 72yo female pt presented to ER secondary to cough, SOB; admitted with acute/chronic CHF (EF 20%), CAP.  Hospital course complicated by afib with RVR (managed with IV, now transitioned to PO, amioderone); ruled out for ACS.    PT Comments    Pt bed.  Incontinent of urine and bowel.  Gown wet.  When asked why she did not call for nursing she stated "Because I don't care".  Pt to edge of bed with min/mod a x 1 with heavy use of rails and assist to bring hips to edge of bed.  Stood with min/mod a x 1.  Once standing pt had poor standing posture and resisted attempts for verbal and physical assist to correct.  Pt with feet too far our under bar, forward posture and generally unsafe "You need to let me do it my way".  Pt was given time and took a significant amount of time to ambulate to bathroom for care.  Pt had difficulty making it to commode and completing turn needing mod assist to complete movement to safely sit on commode.  A second assist was needed to stand for care and to ambulate pt back to recliner which was brought to doorway of bathroom.    Pt with overall increase weakness today and decrease in gait quality and distance.  Pt requires +1-+2 today for mobility skills.  SNF remains appropriate for discharge.  Discussed mobility with nurse tech.     Follow Up Recommendations  SNF     Equipment Recommendations  Rolling walker with 5" wheels    Recommendations for Other Services       Precautions / Restrictions Precautions Precautions: Fall Restrictions Weight Bearing Restrictions: No    Mobility  Bed Mobility Overal bed mobility: Needs Assistance Bed Mobility: Supine to Sit     Supine to sit: Min assist;Mod assist     General bed mobility comments: heavy use of rails today and needed assist to scoot hips to edge of  bed.  Transfers Overall transfer level: Needs assistance Equipment used: Rolling walker (2 wheeled) Transfers: Sit to/from Stand Sit to Stand: Min assist;Mod assist            Ambulation/Gait Ambulation/Gait assistance: Min assist;Mod assist Ambulation Distance (Feet): 30 Feet Assistive device: Rolling walker (2 wheeled) Gait Pattern/deviations: Step-to pattern;Trunk flexed;Decreased step length - right;Decreased step length - left Gait velocity: significantly decreased today Gait velocity interpretation: <1.31 ft/sec, indicative of household Metallurgist Rankin (Stroke Patients Only)       Balance Overall balance assessment: Needs assistance Sitting-balance support: No upper extremity supported;Feet supported Sitting balance-Leahy Scale: Good     Standing balance support: Bilateral upper extremity supported Standing balance-Leahy Scale: Poor                              Cognition Arousal/Alertness: Awake/alert Behavior During Therapy: Agitated Overall Cognitive Status: Within Functional Limits for tasks assessed                                 General Comments: became agitated with attempt at education today "I'm not going to do it the  way you want me to anyways"      Exercises Other Exercises Other Exercises: to commode for BM/void    General Comments        Pertinent Vitals/Pain Pain Assessment: No/denies pain    Home Living                      Prior Function            PT Goals (current goals can now be found in the care plan section) Progress towards PT goals: Not progressing toward goals - comment    Frequency    Min 2X/week      PT Plan Current plan remains appropriate    Co-evaluation              AM-PAC PT "6 Clicks" Daily Activity  Outcome Measure  Difficulty turning over in bed (including adjusting bedclothes, sheets and  blankets)?: Unable Difficulty moving from lying on back to sitting on the side of the bed? : Unable Difficulty sitting down on and standing up from a chair with arms (e.g., wheelchair, bedside commode, etc,.)?: Unable Help needed moving to and from a bed to chair (including a wheelchair)?: A Lot Help needed walking in hospital room?: A Lot Help needed climbing 3-5 steps with a railing? : Total 6 Click Score: 8    End of Session Equipment Utilized During Treatment: Gait belt Activity Tolerance: Patient limited by fatigue;Treatment limited secondary to agitation Patient left: in chair;with chair alarm set;with call bell/phone within reach Nurse Communication: Mobility status       Time: 2376-2831 PT Time Calculation (min) (ACUTE ONLY): 39 min  Charges:  $Gait Training: 8-22 mins $Therapeutic Exercise: 23-37 mins                    G Codes:       Chesley Noon, PTA 11/21/17, 11:18 AM

## 2017-11-21 NOTE — Progress Notes (Signed)
Discharged via ems and stretcher to [facility]. Report called to [place]. Stable at time of discharge.

## 2017-11-21 NOTE — Clinical Social Work Placement (Signed)
   CLINICAL SOCIAL WORK PLACEMENT  NOTE  Date:  11/21/2017  Patient Details  Name: Tina Salinas MRN: [id number] Date of Birth: 1945-10-23  Clinical Social Work is seeking post-discharge placement for this patient at the [place] level of care (*CSW will initial, date and re-position this form in  chart as items are completed):  Yes   Patient/family provided with [facility] Work Department's list of facilities offering this level of care within the geographic area requested by the patient (or if unable, by the patient's family).  Yes   Patient/family informed of their freedom to choose among providers that offer the needed level of care, that participate in Medicare, Medicaid or managed care program needed by the patient, have an available bed and are willing to accept the patient.  Yes   Patient/family informed of [facility]'s ownership interest in [facility] and [facility], as well as of the fact that they are under no obligation to receive care at these facilities.  PASRR submitted to EDS on 11/21/17     PASRR number received on       Existing PASRR number confirmed on 11/21/17     FL2 transmitted to all facilities in geographic area requested by pt/family on 11/21/17     FL2 transmitted to all facilities within larger geographic area on       Patient informed that his/her managed care company has contracts with or will negotiate with certain facilities, including the following:        Yes   Patient/family informed of bed offers received.  Patient chooses bed at [facility]     Physician recommends and patient chooses bed at      Patient to be transferred to [facility] on 11/21/17.  Patient to be transferred to facility by [facility]     Patient family notified on 11/21/17 of transfer.  Name of family member notified:  Left message on [employer] voice mail, patient's friend.      PHYSICIAN Please sign FL2, Please prepare prescriptions     Additional Comment:    _______________________________________________ Ross Ludwig, LCSWA 11/21/2017, 1:14 PM

## 2017-11-22 NOTE — Progress Notes (Signed)
Patient ID: ALAISA Salinas, female    DOB: 1946-03-26, 72 y.o.   MRN: [id number]  HPI  Ms Tina Salinas is a 72 y/o female with a history of CAD, HTN, hyperlipidemia and chronic heart failure.   Echo report from 11/16/17 reviewed and showed an EF of 20-25% along with moderate AS/MR.  Admitted 11/15/17 due to acute on chronic HF and HCAP. Cardiology consult obtained. Given IV antibiotics. Elevated troponin thought to be due to demand ischemia. Needed IV amiodarone due to atrial fibrillation. Discharged after 6 days to SNF for rehab.   She presents today for her initial visit with a chief complaint of moderate fatigue with little exertion. She has associated shortness of breath, light-headedness, leg weakness, wheezing and difficulty sleeping along with this. She denies any abdominal distention, palpitations, edema, chest pain or weight gain. Currently at [facility].   Past Medical History:  Diagnosis Date  . CAD (coronary artery disease)   . Chronic combined systolic and diastolic CHF (congestive heart failure) ([place])   . HTN (hypertension)   . Hyperlipidemia    Past Surgical History:  Procedure Laterality Date  . BREAST ENHANCEMENT SURGERY Bilateral 1980  . CORONARY ANGIOPLASTY WITH STENT PLACEMENT  07/13/2008, 08/19/2008  . HIP ARTHROPLASTY N/A 2000  . LEFT HEART CATH AND CORONARY ANGIOGRAPHY N/A 05/16/2017   Procedure: LEFT HEART CATH AND CORONARY ANGIOGRAPHY;  Surgeon: Teodoro Spray, MD;  Location: [facility];  Service: Cardiovascular;  Laterality: N/A;  . TOTAL KNEE ARTHROPLASTY Left 09/2007, 01/2008   Family History  Problem Relation Age of Onset  . Alcohol abuse Father   . Raynaud syndrome Sister    Social History   Tobacco Use  . Smoking status: Never Smoker  . Smokeless tobacco: Never Used  Substance Use Topics  . Alcohol use: No   Allergies  Allergen Reactions  . Etodolac     Other reaction(s): Unknown  . Sulfa Antibiotics   . Tetracycline    Prior to Admission  medications   Medication Sig Start Date End Date Taking? Authorizing Provider  alendronate (FOSAMAX) 70 MG tablet Take 70 mg by mouth once a week.  03/30/14  Yes [provider]  amiodarone (PACERONE) 400 MG tablet Take 1 tablet (400 mg total) by mouth 2 (two) times daily. 11/21/17  Yes Pyreddy, Reatha Harps, MD  aspirin 81 MG tablet Take 81 mg by mouth daily.   Yes [provider]  benzonatate (TESSALON) 100 MG capsule Take 1 capsule (100 mg total) by mouth 3 (three) times daily as needed for cough. 11/21/17  Yes Pyreddy, Reatha Harps, MD  cholecalciferol (VITAMIN D) 1000 UNITS tablet Take 1,000 Units by mouth daily.  04/25/10  Yes [provider]  fluticasone (FLONASE) 50 MCG/ACT nasal spray Place 2 sprays into both nostrils daily. 07/13/17  Yes Birdie Sons, MD  furosemide (LASIX) 20 MG tablet Take 1 tablet (20 mg total) by mouth daily. 11/22/17  Yes Pyreddy, Reatha Harps, MD  hydroxychloroquine (PLAQUENIL) 200 MG tablet Take 400 mg by mouth daily.    Yes [provider]  latanoprost (XALATAN) 0.005 % ophthalmic solution Place 1 drop into both eyes at bedtime.   Yes [provider]  Methotrexate, PF, 25 MG/0.5ML SOAJ Inject 1.4 mLs into the skin once a week.   Yes [provider]  Methylcellulose, Laxative, (GNP FIBER THERAPY PO) Take 1 Dose by mouth daily as needed (constipation).    Yes [provider]  oxybutynin (DITROPAN) 5 MG tablet TAKE 1  TABLET TWICE DAILY AS NEEDED  FOR  BLADDER  SPASMS 05/31/17  Yes Birdie Sons, MD  pravastatin (PRAVACHOL) 40 MG tablet Take 1 tablet (40 mg total) by mouth at bedtime. 06/01/16  Yes Birdie Sons, MD  predniSONE (DELTASONE) 5 MG tablet Take 5 mg by mouth daily with breakfast.   Yes [provider]   Review of Systems  Constitutional: Positive for fatigue. Negative for appetite change.  HENT: Positive for hearing loss (left ear). Negative for congestion and sore throat.   Eyes: Negative.    Respiratory: Positive for shortness of breath and wheezing. Negative for chest tightness.   Cardiovascular: Negative for chest pain, palpitations and leg swelling.  Gastrointestinal: Negative for abdominal distention and abdominal pain.  Endocrine: Negative.   Genitourinary: Negative.   Musculoskeletal: Negative for back pain and neck pain.  Skin: Negative.   Allergic/Immunologic: Negative.   Neurological: Positive for weakness and light-headedness (when changing positions too quickly). Negative for dizziness.  Hematological: Negative for adenopathy. Bruises/bleeds easily.  Psychiatric/Behavioral: Positive for sleep disturbance (sleeping on 2 pillows). Negative for dysphoric mood. The patient is not nervous/anxious.     Vitals:   11/26/17 1019  BP: 118/69  Pulse: 69  Resp: 18  SpO2: 100%  Weight: 105 lb 4 oz (47.7 kg)  Height: 5\' 1"  (1.549 m)   Wt Readings from Last 3 Encounters:  11/26/17 105 lb 4 oz (47.7 kg)  11/21/17 108 lb 4.8 oz (49.1 kg)  09/11/17 119 lb 9.6 oz (54.3 kg)   Lab Results  Component Value Date   CREATININE 0.69 11/18/2017   CREATININE 0.65 11/16/2017   CREATININE 0.78 11/15/2017    Physical Exam  Constitutional: She is oriented to person, place, and time. She appears well-developed and well-nourished.  HENT:  Head: Normocephalic and atraumatic.  Left Ear: Decreased hearing is noted.  Neck: Normal range of motion. Neck supple. No JVD present.  Cardiovascular: Normal rate. An irregular rhythm present.  Pulmonary/Chest: Effort normal. She has wheezes (few lower lobes). She has no rales.  Abdominal: Soft. She exhibits no distension. There is no tenderness.  Musculoskeletal: She exhibits no edema or tenderness.  Neurological: She is alert and oriented to person, place, and time.  Skin: Skin is warm and dry.  Psychiatric: She has a normal mood and affect. Her behavior is normal. Thought content normal.  Nursing note and vitals reviewed.  Assessment and  Plan:  1: Chronic heart failure with reduced ejection fraction- - NYHA class III - euvolemic today - unsure if being weighed daily at [facility] so an order was written for daily weights and for them to call for an overnight weight gain of >2 pounds or a weekly weight gain of >5 pounds - normally doesn't add salt but does eat out often when at home as she lives by herself. Order written for her to be on a low sodium diet at [facility]. Explained the importance of closely following a 2000mg  sodium diet once she returns home - discussed possibly ordering home health once she returns home - saw cardiology (Gregory) 07/24/17 and returns 10/14/17 - BNP 11/15/17 was 1015.0 - PharmD reconciled medications with the patient  2: HTN- - BP looks good today - saw PCP Caryn Section) 11/15/17 - BMP 11/18/17 reviewed and showed sodium 139, potassium 3.9 and GFR >60  3: Atrial fibrillation- - verbal order received from Dr. Ubaldo Glassing to decrease amiodarone to 200mg  daily  Facility medication list was reviewed.

## 2017-11-26 ENCOUNTER — Ambulatory Visit: Payer: Medicare PPO | Attending: Family | Admitting: Family

## 2017-11-26 ENCOUNTER — Encounter: Payer: Self-pay | Admitting: Family

## 2017-11-26 VITALS — BP 118/69 | HR 69 | Resp 18 | Ht 61.0 in | Wt 105.2 lb

## 2017-11-26 DIAGNOSIS — Z96652 Presence of left artificial knee joint: Secondary | ICD-10-CM | POA: Diagnosis not present

## 2017-11-26 DIAGNOSIS — Z888 Allergy status to other drugs, medicaments and biological substances status: Secondary | ICD-10-CM | POA: Diagnosis not present

## 2017-11-26 DIAGNOSIS — Z7952 Long term (current) use of systemic steroids: Secondary | ICD-10-CM | POA: Diagnosis not present

## 2017-11-26 DIAGNOSIS — Z9889 Other specified postprocedural states: Secondary | ICD-10-CM | POA: Insufficient documentation

## 2017-11-26 DIAGNOSIS — I110 Hypertensive heart disease with heart failure: Secondary | ICD-10-CM | POA: Diagnosis not present

## 2017-11-26 DIAGNOSIS — Z8489 Family history of other specified conditions: Secondary | ICD-10-CM | POA: Insufficient documentation

## 2017-11-26 DIAGNOSIS — Z79899 Other long term (current) drug therapy: Secondary | ICD-10-CM | POA: Insufficient documentation

## 2017-11-26 DIAGNOSIS — Z882 Allergy status to sulfonamides status: Secondary | ICD-10-CM | POA: Insufficient documentation

## 2017-11-26 DIAGNOSIS — R062 Wheezing: Secondary | ICD-10-CM | POA: Insufficient documentation

## 2017-11-26 DIAGNOSIS — I5042 Chronic combined systolic (congestive) and diastolic (congestive) heart failure: Secondary | ICD-10-CM | POA: Insufficient documentation

## 2017-11-26 DIAGNOSIS — Z7982 Long term (current) use of aspirin: Secondary | ICD-10-CM | POA: Insufficient documentation

## 2017-11-26 DIAGNOSIS — I2510 Atherosclerotic heart disease of native coronary artery without angina pectoris: Secondary | ICD-10-CM | POA: Diagnosis not present

## 2017-11-26 DIAGNOSIS — I10 Essential (primary) hypertension: Secondary | ICD-10-CM

## 2017-11-26 DIAGNOSIS — I5022 Chronic systolic (congestive) heart failure: Secondary | ICD-10-CM

## 2017-11-26 DIAGNOSIS — Z881 Allergy status to other antibiotic agents status: Secondary | ICD-10-CM | POA: Diagnosis not present

## 2017-11-26 DIAGNOSIS — Z811 Family history of alcohol abuse and dependence: Secondary | ICD-10-CM | POA: Diagnosis not present

## 2017-11-26 DIAGNOSIS — R0602 Shortness of breath: Secondary | ICD-10-CM | POA: Diagnosis not present

## 2017-11-26 DIAGNOSIS — I480 Paroxysmal atrial fibrillation: Secondary | ICD-10-CM

## 2017-11-26 DIAGNOSIS — E785 Hyperlipidemia, unspecified: Secondary | ICD-10-CM | POA: Diagnosis not present

## 2017-11-26 DIAGNOSIS — Z955 Presence of coronary angioplasty implant and graft: Secondary | ICD-10-CM | POA: Insufficient documentation

## 2017-11-26 DIAGNOSIS — I4891 Unspecified atrial fibrillation: Secondary | ICD-10-CM | POA: Insufficient documentation

## 2017-11-26 DIAGNOSIS — R42 Dizziness and giddiness: Secondary | ICD-10-CM | POA: Insufficient documentation

## 2017-11-26 NOTE — Patient Instructions (Addendum)
Continue weighing daily and call for an overnight weight gain of > 2 pounds or a weekly weight gain of >5 pounds.  Follow up with Dr. Ubaldo Glassing on 3/31 at 9:30 am

## 2017-12-07 ENCOUNTER — Other Ambulatory Visit: Payer: Self-pay | Admitting: Family Medicine

## 2017-12-07 ENCOUNTER — Ambulatory Visit: Payer: Self-pay | Admitting: Family Medicine

## 2017-12-07 MED ORDER — FLUTICASONE PROPIONATE 50 MCG/ACT NA SUSP: 2.0000 | Freq: Every day | NASAL | 6 refills | Status: DC

## 2017-12-07 NOTE — Telephone Encounter (Signed)
Pt contacted office for refill request on the following medications:  fluticasone (FLONASE) 50 MCG/ACT nasal spray  [facility]  Last Rx: 06/23/17 with 6 refills sent to [facility] but pt wants it sent to [facility] LOV: 11/15/17 Please advise. Thanks TNP

## 2017-12-11 ENCOUNTER — Telehealth: Payer: Self-pay | Admitting: Emergency Medicine

## 2017-12-11 ENCOUNTER — Ambulatory Visit: Payer: Self-pay | Admitting: Family Medicine

## 2017-12-11 NOTE — Telephone Encounter (Signed)
That's fine

## 2017-12-11 NOTE — Telephone Encounter (Signed)
Tried [employer]. Left message to call back.

## 2017-12-11 NOTE — Telephone Encounter (Signed)
Please advise 

## 2017-12-11 NOTE — Telephone Encounter (Signed)
Magda Paganini  PT from [facility] needs verbal orders to see pt. She just needs to make sure Dr. Caryn Section would be willing to sign off on the PT orders for pt. CB# [phone]

## 2017-12-12 NOTE — Telephone Encounter (Signed)
Magda Paganini from [facility] called back and I gave her message that Dr. Caryn Section said this was fine.  teri

## 2017-12-17 ENCOUNTER — Telehealth: Payer: Self-pay | Admitting: Family Medicine

## 2017-12-17 DIAGNOSIS — I4891 Unspecified atrial fibrillation: Secondary | ICD-10-CM

## 2017-12-17 DIAGNOSIS — M059 Rheumatoid arthritis with rheumatoid factor, unspecified: Secondary | ICD-10-CM

## 2017-12-17 DIAGNOSIS — J189 Pneumonia, unspecified organism: Secondary | ICD-10-CM | POA: Diagnosis not present

## 2017-12-17 DIAGNOSIS — I890 Lymphedema, not elsewhere classified: Secondary | ICD-10-CM | POA: Diagnosis not present

## 2017-12-17 DIAGNOSIS — I10 Essential (primary) hypertension: Secondary | ICD-10-CM | POA: Diagnosis not present

## 2017-12-17 DIAGNOSIS — I5043 Acute on chronic combined systolic (congestive) and diastolic (congestive) heart failure: Secondary | ICD-10-CM | POA: Diagnosis not present

## 2017-12-17 NOTE — Telephone Encounter (Signed)
Left detailed message on Shannon's vm, giving verbal okay.

## 2017-12-17 NOTE — Telephone Encounter (Signed)
Please advise 

## 2017-12-17 NOTE — Telephone Encounter (Signed)
That's fine

## 2017-12-17 NOTE — Telephone Encounter (Signed)
Tina Salinas with [facility] is requesting verbal orders for in home nursing visits as follows:  Twice a week for 2 weeks Once a week for 6 weeks  For [facility] education and management. Please advise. Thanks TNP

## 2017-12-20 ENCOUNTER — Telehealth: Payer: Self-pay | Admitting: Family Medicine

## 2017-12-20 NOTE — Telephone Encounter (Signed)
This is for Physical Therapy visits. teri

## 2017-12-20 NOTE — Telephone Encounter (Signed)
Please advise 

## 2017-12-20 NOTE — Telephone Encounter (Signed)
Ben with [facility] called wanting orders for to extend for 5 more visits . 2 week two times and one week one    [phone]  Thanks teri

## 2017-12-21 ENCOUNTER — Ambulatory Visit: Payer: Medicare PPO | Admitting: Family Medicine

## 2017-12-21 ENCOUNTER — Encounter: Payer: Self-pay | Admitting: Family Medicine

## 2017-12-21 VITALS — BP 110/60 | HR 70 | Temp 97.5°F | Resp 16 | Wt 102.0 lb

## 2017-12-21 DIAGNOSIS — I480 Paroxysmal atrial fibrillation: Secondary | ICD-10-CM

## 2017-12-21 DIAGNOSIS — I5022 Chronic systolic (congestive) heart failure: Secondary | ICD-10-CM

## 2017-12-21 MED ORDER — ACYCLOVIR 800 MG PO TABS: 800.0000 mg | ORAL_TABLET | Freq: Three times a day (TID) | ORAL | 2 refills | Status: DC | PRN

## 2017-12-21 MED ORDER — HYDROCODONE-ACETAMINOPHEN 7.5-325 MG PO TABS: 2.0000 | ORAL_TABLET | Freq: Three times a day (TID) | ORAL | 0 refills | Status: DC | PRN

## 2017-12-21 NOTE — Patient Instructions (Signed)
You can call [phone] to get MedAlert bracelet through [facility]

## 2017-12-21 NOTE — Telephone Encounter (Signed)
That's fine

## 2017-12-21 NOTE — Progress Notes (Signed)
Patient: Tina Salinas Female    DOB: 22-Nov-1945   72 y.o.   MRN: [id number] Visit Date: 12/21/2017  Today's Provider: Lelon Huh, MD   No chief complaint on file.  Subjective:    HPI   Follow up Hospitalization  Patient was admitted to [facility] on 11/15/2017 and discharged on 11/21/2017. She was treated for heart failure, new onset atrial fibrillation and community acquired pneumonia. Marland Kitchen  She was discharged on amiodarone  and Augmentin. Was not started on anticoagulation due to fall risk. .  Treatment for this included . Patient was discharged from hospital to [facility]. She was discharged from [facility] last week and is now living with a neighbor. She is expecting to return to her home next month. She had follow up Dr. Ubaldo Glassing on 12/14/2017 and is to return in 3 months.   She reports good compliance with treatment. She reports this condition is Improved. He feels her strength is back to her baseline, has had no falls since hospital discharge.  ----------------------------------------------------------------      Allergies  Allergen Reactions  . Etodolac     Other reaction(s): Unknown  . Sulfa Antibiotics   . Tetracycline      Current Outpatient Medications:  .  alendronate (FOSAMAX) 70 MG tablet, Take 70 mg by mouth once a week. , Disp: , Rfl:  .  amiodarone (PACERONE) 400 MG tablet, Take 1 tablet (400 mg total) by mouth 2 (two) times daily. (Patient taking differently: Take 200 mg by mouth daily. ), Disp: 60 tablet, Rfl: 0 .  aspirin 81 MG tablet, Take 81 mg by mouth daily., Disp: , Rfl:  .  cholecalciferol (VITAMIN D) 1000 UNITS tablet, Take 1,000 Units by mouth daily. , Disp: , Rfl:  .  fluticasone (FLONASE) 50 MCG/ACT nasal spray, Place 2 sprays into both nostrils daily., Disp: 16 g, Rfl: 6 .  furosemide (LASIX) 20 MG tablet, Take 1 tablet (20 mg total) by mouth daily., Disp: 30 tablet, Rfl: 1 .  hydroxychloroquine (PLAQUENIL) 200 MG tablet, Take 400 mg by mouth daily. ,  Disp: , Rfl:  .  latanoprost (XALATAN) 0.005 % ophthalmic solution, Place 1 drop into both eyes at bedtime., Disp: , Rfl:  .  Methotrexate, PF, 25 MG/0.5ML SOAJ, Inject 1.4 mLs into the skin once a week., Disp: , Rfl:  .  Methylcellulose, Laxative, (GNP FIBER THERAPY PO), Take 1 Dose by mouth daily as needed (constipation). , Disp: , Rfl:  .  oxybutynin (DITROPAN) 5 MG tablet, TAKE 1 TABLET TWICE DAILY AS NEEDED  FOR  BLADDER  SPASMS, Disp: 180 tablet, Rfl: 4 .  pravastatin (PRAVACHOL) 40 MG tablet, Take 1 tablet (40 mg total) by mouth at bedtime., Disp: 30 tablet, Rfl: 0 .  predniSONE (DELTASONE) 5 MG tablet, Take 5 mg by mouth daily with breakfast., Disp: , Rfl:   Review of Systems  Constitutional: Negative for appetite change, chills, fatigue and fever.  Respiratory: Negative for chest tightness and shortness of breath.   Cardiovascular: Negative for chest pain and palpitations.  Gastrointestinal: Negative for abdominal pain, nausea and vomiting.  Neurological: Negative for dizziness and weakness.    Social History   Tobacco Use  . Smoking status: Never Smoker  . Smokeless tobacco: Never Used  Substance Use Topics  . Alcohol use: No   Objective:   BP 110/60 (BP Location: Right Arm, Patient Position: Sitting, Cuff Size: Normal)   Pulse 70   Temp (!) 97.5 F (  36.4 C) (Oral)   Resp 16   Wt 102 lb (46.3 kg)   SpO2 95%   BMI 19.27 kg/m     Physical Exam   General Appearance:    Alert, cooperative, no distress  Eyes:    PERRL, conjunctiva/corneas clear, EOM's intact       Lungs:     Clear to auscultation bilaterally, respirations unlabored  Heart:    Regular rate and rhythm  Neurologic:   Awake, alert, oriented x 3. No apparent focal neurological           defect.           Assessment & Plan:     1. Chronic systolic heart failure ([place]) Greatly improved since hospital discharge. Currently living with acquaintance. Given information to obtain [place] from [facility]   2.  Paroxysmal atrial fibrillation (HCC) Regular rhythm on exam today. On amiodarone. No anticoagulation due to fall risk. Follow up cardiology in August.    Refilled acyclovir and hydrocodone/apap        Lelon Huh, MD  [facility]

## 2017-12-21 NOTE — Telephone Encounter (Signed)
Tina Salinas was given verbal order.  

## 2017-12-26 ENCOUNTER — Telehealth: Payer: Self-pay | Admitting: Family Medicine

## 2017-12-26 NOTE — Telephone Encounter (Signed)
Please advise 

## 2017-12-26 NOTE — Telephone Encounter (Signed)
Pt wants an order or prescription to purchase her own blood pressure cuff..  She also wants the nurse with [employer] [facility] to continue to come out  To see her.  Pt's call back is [phone] or [phone] and leave a message   Thanks Con [place]

## 2017-12-29 NOTE — Telephone Encounter (Signed)
Have written order home blood pressure cuff.  Home health has ordered to continue coming out through the end of July.

## 2017-12-31 NOTE — Telephone Encounter (Signed)
Left message on pt's vm advising her order is ready.

## 2018-01-07 ENCOUNTER — Ambulatory Visit: Payer: Medicare PPO | Attending: Family | Admitting: Family

## 2018-01-07 ENCOUNTER — Encounter: Payer: Self-pay | Admitting: Family

## 2018-01-07 VITALS — BP 113/67 | HR 74 | Resp 18 | Ht 61.0 in | Wt 104.5 lb

## 2018-01-07 DIAGNOSIS — R062 Wheezing: Secondary | ICD-10-CM | POA: Insufficient documentation

## 2018-01-07 DIAGNOSIS — I5042 Chronic combined systolic (congestive) and diastolic (congestive) heart failure: Secondary | ICD-10-CM | POA: Diagnosis present

## 2018-01-07 DIAGNOSIS — R110 Nausea: Secondary | ICD-10-CM | POA: Diagnosis not present

## 2018-01-07 DIAGNOSIS — Z882 Allergy status to sulfonamides status: Secondary | ICD-10-CM | POA: Insufficient documentation

## 2018-01-07 DIAGNOSIS — I110 Hypertensive heart disease with heart failure: Secondary | ICD-10-CM | POA: Insufficient documentation

## 2018-01-07 DIAGNOSIS — R42 Dizziness and giddiness: Secondary | ICD-10-CM | POA: Diagnosis not present

## 2018-01-07 DIAGNOSIS — Z79899 Other long term (current) drug therapy: Secondary | ICD-10-CM | POA: Insufficient documentation

## 2018-01-07 DIAGNOSIS — R531 Weakness: Secondary | ICD-10-CM | POA: Insufficient documentation

## 2018-01-07 DIAGNOSIS — E785 Hyperlipidemia, unspecified: Secondary | ICD-10-CM | POA: Insufficient documentation

## 2018-01-07 DIAGNOSIS — I5022 Chronic systolic (congestive) heart failure: Secondary | ICD-10-CM

## 2018-01-07 DIAGNOSIS — Z7982 Long term (current) use of aspirin: Secondary | ICD-10-CM | POA: Insufficient documentation

## 2018-01-07 DIAGNOSIS — I4891 Unspecified atrial fibrillation: Secondary | ICD-10-CM | POA: Insufficient documentation

## 2018-01-07 DIAGNOSIS — I2510 Atherosclerotic heart disease of native coronary artery without angina pectoris: Secondary | ICD-10-CM | POA: Diagnosis not present

## 2018-01-07 DIAGNOSIS — I480 Paroxysmal atrial fibrillation: Secondary | ICD-10-CM

## 2018-01-07 DIAGNOSIS — I10 Essential (primary) hypertension: Secondary | ICD-10-CM

## 2018-01-07 MED ORDER — CARVEDILOL 3.125 MG PO TABS: 3.1250 mg | ORAL_TABLET | Freq: Two times a day (BID) | ORAL | 3 refills | Status: DC

## 2018-01-07 NOTE — Patient Instructions (Addendum)
Continue weighing daily and call for an overnight weight gain of > 2 pounds or a weekly weight gain of >5 pounds.  Begin taking carvedilol 3.125mg  twice daily.

## 2018-01-07 NOTE — Progress Notes (Signed)
Patient ID: Tina Salinas, female    DOB: [birth year], 72 y.o.   MRN: [id number]  HPI  Tina Salinas is a 72 y/o female with a history of CAD, HTN, hyperlipidemia and chronic heart failure.   Echo report from 11/16/17 reviewed and showed an EF of 20-25% along with moderate AS/MR.  Admitted 11/15/17 due to acute on chronic HF and HCAP. Cardiology consult obtained. Given IV antibiotics. Elevated troponin thought to be due to demand ischemia. Needed IV amiodarone due to atrial fibrillation. Discharged after 6 days to SNF for rehab.   She presents today for a follow-up visit with a chief complaint of moderate fatigue upon minimal exertion. She says that this has been present for several years. She has associated wheezing, light-headedness, weakness, nausea and difficulty sleeping along with this. She denies any abdominal distention, palpitations, pedal edema, chest pain, shortness of breath or weight gain.   Past Medical History:  Diagnosis Date  . CAD (coronary artery disease)   . Chronic combined systolic and diastolic CHF (congestive heart failure) ([place])   . HTN (hypertension)   . Hyperlipidemia    Past Surgical History:  Procedure Laterality Date  . BREAST ENHANCEMENT SURGERY Bilateral 1980  . CORONARY ANGIOPLASTY WITH STENT PLACEMENT  07/13/2008, 08/19/2008  . HIP ARTHROPLASTY N/A 2000  . LEFT HEART CATH AND CORONARY ANGIOGRAPHY N/A 05/16/2017   Procedure: LEFT HEART CATH AND CORONARY ANGIOGRAPHY;  Surgeon: Teodoro Spray, MD;  Location: [facility];  Service: Cardiovascular;  Laterality: N/A;  . TOTAL KNEE ARTHROPLASTY Left 09/2007, 01/2008   Family History  Problem Relation Age of Onset  . Alcohol abuse Father   . Raynaud syndrome Sister    Social History   Tobacco Use  . Smoking status: Never Smoker  . Smokeless tobacco: Never Used  Substance Use Topics  . Alcohol use: No   Allergies  Allergen Reactions  . Etodolac     Other reaction(s): Unknown  . Sulfa Antibiotics   .  Tetracycline    Prior to Admission medications   Medication Sig Start Date End Date Taking? Authorizing Provider  acyclovir (ZOVIRAX) 800 MG tablet Take 1 tablet (800 mg total) by mouth 3 (three) times daily as needed. 12/21/17  Yes Birdie Sons, MD  alendronate (FOSAMAX) 70 MG tablet Take 70 mg by mouth once a week.  03/30/14  Yes [provider]  aspirin 81 MG tablet Take 81 mg by mouth daily.   Yes [provider]  cholecalciferol (VITAMIN D) 1000 UNITS tablet Take 1,000 Units by mouth daily.  04/25/10  Yes [provider]  fluticasone (FLONASE) 50 MCG/ACT nasal spray Place 2 sprays into both nostrils daily. 12/07/17  Yes Birdie Sons, MD  furosemide (LASIX) 20 MG tablet Take 1 tablet (20 mg total) by mouth daily. 11/22/17  Yes Pyreddy, Reatha Harps, MD  HYDROcodone-acetaminophen (NORCO) 7.5-325 MG tablet Take 2 tablets by mouth 3 (three) times daily as needed for moderate pain. 12/21/17  Yes Birdie Sons, MD  hydroxychloroquine (PLAQUENIL) 200 MG tablet Take 400 mg by mouth daily.    Yes [provider]  latanoprost (XALATAN) 0.005 % ophthalmic solution Place 1 drop into both eyes at bedtime.   Yes [provider]  Methotrexate, PF, 25 MG/0.5ML SOAJ Inject 1.4 mLs into the skin once a week.   Yes [provider]  Methylcellulose, Laxative, (GNP FIBER THERAPY PO) Take 1 Dose by mouth daily as needed (constipation).    Yes [provider]  oxybutynin (DITROPAN) 5 MG tablet TAKE 1 TABLET TWICE DAILY AS NEEDED  FOR  BLADDER  SPASMS 05/31/17  Yes Birdie Sons, MD  pravastatin (PRAVACHOL) 40 MG tablet Take 1 tablet (40 mg total) by mouth at bedtime. 06/01/16  Yes Birdie Sons, MD  predniSONE (DELTASONE) 5 MG tablet Take 5 mg by mouth daily with breakfast.   Yes [provider]    Review of Systems  Constitutional: Positive for fatigue. Negative for appetite change.  HENT: Positive for hearing loss (left ear). Negative  for congestion and sore throat.   Eyes: Negative.   Respiratory: Positive for wheezing. Negative for chest tightness and shortness of breath.   Cardiovascular: Negative for chest pain, palpitations and leg swelling.  Gastrointestinal: Positive for nausea. Negative for abdominal distention and abdominal pain.  Endocrine: Negative.   Genitourinary: Negative.   Musculoskeletal: Negative for back pain and neck pain.  Skin: Negative.   Allergic/Immunologic: Negative.   Neurological: Positive for weakness and light-headedness (when changing positions too quickly). Negative for dizziness.  Hematological: Negative for adenopathy. Bruises/bleeds easily.  Psychiatric/Behavioral: Positive for sleep disturbance (sleeping on 2 pillows). Negative for dysphoric mood. The patient is not nervous/anxious.    Vitals:   01/07/18 1221  BP: 113/67  Pulse: 74  Resp: 18  SpO2: 100%  Weight: 104 lb 8 oz (47.4 kg)  Height: 5\' 1"  (1.549 m)   Wt Readings from Last 3 Encounters:  01/07/18 104 lb 8 oz (47.4 kg)  12/21/17 102 lb (46.3 kg)  11/26/17 105 lb 4 oz (47.7 kg)   Lab Results  Component Value Date   CREATININE 0.69 11/18/2017   CREATININE 0.65 11/16/2017   CREATININE 0.78 11/15/2017    Physical Exam  Constitutional: She is oriented to person, place, and time. She appears well-developed and well-nourished.  HENT:  Head: Normocephalic and atraumatic.  Left Ear: Decreased hearing is noted.  Neck: Normal range of motion. Neck supple. No JVD present.  Cardiovascular: Normal rate. An irregular rhythm present.  Pulmonary/Chest: Effort normal. She has no wheezes. She has no rales.  Abdominal: Soft. She exhibits no distension. There is no tenderness.  Musculoskeletal: She exhibits no edema or tenderness.  Neurological: She is alert and oriented to person, place, and time.  Skin: Skin is warm and dry.  Psychiatric: She has a normal mood and affect. Her behavior is normal. Thought content normal.   Nursing note and vitals reviewed.  Assessment and Plan:  1: Chronic heart failure with reduced ejection fraction- - NYHA class III - euvolemic today - weighing daily and says that her weight has been stable. Reminded to call for an overnight weight gain of >2 pounds or a weekly weight gain of >5 pounds - weight stable from the last time she was here - now back to living at home. Reminded to closely follow a 2000mg  sodium diet  - discussed possibly ordering home health once she returns home - saw cardiology (Fath) 12/14/17 - BNP 11/15/17 was 1015.0 - will add low-dose carvedilol 3.125mg  twice daily; titrate up if able at future visits  2: HTN- - BP looks good today - saw PCP Caryn Section) 12/21/17 - BMP 11/18/17 reviewed and showed sodium 139, potassium 3.9 and GFR >60  3: Atrial fibrillation- - taking amiodarone - adding carvedilol per above  Patient did not bring her medications nor a list. Each medication was verbally reviewed with the patient and she was encouraged to bring the bottles to every visit to confirm  accuracy of list.  Return in 1 month or sooner for any questions/problems before then.

## 2018-01-08 ENCOUNTER — Encounter: Payer: Self-pay | Admitting: Family

## 2018-01-16 ENCOUNTER — Telehealth: Payer: Self-pay | Admitting: Family Medicine

## 2018-01-16 DIAGNOSIS — G4700 Insomnia, unspecified: Secondary | ICD-10-CM

## 2018-01-16 MED ORDER — ALPRAZOLAM 0.25 MG PO TABS: 0.2500 mg | ORAL_TABLET | Freq: Every evening | ORAL | 3 refills | Status: DC | PRN

## 2018-01-16 NOTE — Telephone Encounter (Signed)
[facility] stated that pt was prescribed ALPRAZolam (XANAX) 0.5 MG tablet to take 1/2 or 1 tablet at bedtime. Pt expressed to pharmacy that she has been taking 1/2 tablet but because of her arthritis she has a hard time breaking the tablets in half. Pharmacy is requesting a new Rx for ALPRAZolam (XANAX) 0.25 MG tablet. It appears that when pt was discharged from hospital they advised pt to stop taking the medication once discharged. Please advise. Thanks TNP

## 2018-01-16 NOTE — Telephone Encounter (Signed)
Please advise 

## 2018-01-31 ENCOUNTER — Telehealth: Payer: Self-pay | Admitting: Family Medicine

## 2018-01-31 NOTE — Telephone Encounter (Signed)
Tina Salinas with Tina Salinas is requesting verbal orders to re-cert pt for in home nursing visits for pt's congestive heart failure and A-Fib. They would like to continue seeing pt once a week for 4 weeks. Please advise. Thanks TNP

## 2018-01-31 NOTE — Telephone Encounter (Signed)
Verbal order given  

## 2018-01-31 NOTE — Telephone Encounter (Signed)
That's fine

## 2018-01-31 NOTE — Telephone Encounter (Signed)
Please advise 

## 2018-02-05 DIAGNOSIS — I110 Hypertensive heart disease with heart failure: Secondary | ICD-10-CM

## 2018-02-05 DIAGNOSIS — E785 Hyperlipidemia, unspecified: Secondary | ICD-10-CM

## 2018-02-05 DIAGNOSIS — M059 Rheumatoid arthritis with rheumatoid factor, unspecified: Secondary | ICD-10-CM

## 2018-02-05 DIAGNOSIS — I5043 Acute on chronic combined systolic (congestive) and diastolic (congestive) heart failure: Secondary | ICD-10-CM

## 2018-02-05 DIAGNOSIS — I890 Lymphedema, not elsewhere classified: Secondary | ICD-10-CM

## 2018-02-05 DIAGNOSIS — E440 Moderate protein-calorie malnutrition: Secondary | ICD-10-CM

## 2018-02-05 DIAGNOSIS — I4891 Unspecified atrial fibrillation: Secondary | ICD-10-CM

## 2018-02-06 ENCOUNTER — Telehealth: Payer: Self-pay | Admitting: Family Medicine

## 2018-02-06 ENCOUNTER — Other Ambulatory Visit: Payer: Self-pay | Admitting: Family Medicine

## 2018-02-06 DIAGNOSIS — E039 Hypothyroidism, unspecified: Secondary | ICD-10-CM

## 2018-02-06 DIAGNOSIS — E7800 Pure hypercholesterolemia, unspecified: Secondary | ICD-10-CM

## 2018-02-06 DIAGNOSIS — I10 Essential (primary) hypertension: Secondary | ICD-10-CM

## 2018-02-06 DIAGNOSIS — I2510 Atherosclerotic heart disease of native coronary artery without angina pectoris: Secondary | ICD-10-CM

## 2018-02-06 DIAGNOSIS — M810 Age-related osteoporosis without current pathological fracture: Secondary | ICD-10-CM

## 2018-02-06 DIAGNOSIS — E559 Vitamin D deficiency, unspecified: Secondary | ICD-10-CM

## 2018-02-06 DIAGNOSIS — E038 Other specified hypothyroidism: Secondary | ICD-10-CM

## 2018-02-06 NOTE — Telephone Encounter (Signed)
Patient walks in today requesting to do labs that were ordered last December that were never done.

## 2018-02-11 ENCOUNTER — Ambulatory Visit: Payer: Medicare PPO | Admitting: Family

## 2018-02-13 ENCOUNTER — Telehealth: Payer: Self-pay | Admitting: Family Medicine

## 2018-02-13 NOTE — Telephone Encounter (Signed)
Pt called back and said she does not need a nurse to call her back.Tina Salinas  teri

## 2018-02-13 NOTE — Telephone Encounter (Signed)
Pt request nurse return her call. The only information pt would disclose is it's about medication. Please advise. Thanks TNP

## 2018-02-27 ENCOUNTER — Telehealth: Payer: Self-pay | Admitting: Family Medicine

## 2018-02-27 MED ORDER — CYPROHEPTADINE HCL 4 MG PO TABS: 4.0000 mg | ORAL_TABLET | Freq: Three times a day (TID) | ORAL | 5 refills | Status: DC

## 2018-02-27 NOTE — Telephone Encounter (Signed)
[facility] wants to know if she can get an order for a appetite stimulator to make her [place].  Patient is not eating hardly anything.

## 2018-02-27 NOTE — Telephone Encounter (Signed)
Please advise 

## 2018-02-27 NOTE — Telephone Encounter (Signed)
Have sent prescription for periactin to [facility]

## 2018-02-27 NOTE — Telephone Encounter (Signed)
Tried [employer] manager, [employer]. Left message to call back.

## 2018-03-02 LAB — CBC
Hematocrit: 36.6 % (ref 34.0–46.6)
Hemoglobin: 12.3 g/dL (ref 11.1–15.9)
MCH: 34 pg — ABNORMAL HIGH (ref 26.6–33.0)
MCHC: 33.6 g/dL (ref 31.5–35.7)
MCV: 101 fL — AB (ref 79–97)
PLATELETS: 221 10*3/uL (ref 150–450)
RBC: 3.62 x10E6/uL — AB (ref 3.77–5.28)
RDW: 16.7 % — ABNORMAL HIGH (ref 12.3–15.4)
WBC: 6.1 10*3/uL (ref 3.4–10.8)

## 2018-03-02 LAB — LIPID PANEL
CHOLESTEROL TOTAL: 138 mg/dL (ref 100–199)
Chol/HDL Ratio: 2.1 ratio (ref 0.0–4.4)
HDL: 67 mg/dL (ref >39)
LDL Calculated: 58 mg/dL (ref 0–99)
Triglycerides: 64 mg/dL (ref 0–149)
VLDL CHOLESTEROL CAL: 13 mg/dL (ref 5–40)

## 2018-03-02 LAB — COMPREHENSIVE METABOLIC PANEL
A/G RATIO: 1.6 (ref 1.2–2.2)
ALK PHOS: 81 IU/L (ref 39–117)
ALT: 18 IU/L (ref 0–32)
AST: 33 IU/L (ref 0–40)
Albumin: 3.8 g/dL (ref 3.5–4.8)
BILIRUBIN TOTAL: 0.5 mg/dL (ref 0.0–1.2)
BUN / CREAT RATIO: 14 (ref 12–28)
BUN: 25 mg/dL (ref 8–27)
CHLORIDE: 104 mmol/L (ref 96–106)
CO2: 27 mmol/L (ref 20–29)
Calcium: 8.9 mg/dL (ref 8.7–10.3)
Creatinine, Ser: 1.74 mg/dL — ABNORMAL HIGH (ref 0.57–1.00)
GFR calc Af Amer: 34 mL/min/{1.73_m2} — ABNORMAL LOW (ref >59)
GFR calc non Af Amer: 29 mL/min/{1.73_m2} — ABNORMAL LOW (ref >59)
GLUCOSE: 70 mg/dL (ref 65–99)
Globulin, Total: 2.4 g/dL (ref 1.5–4.5)
Potassium: 3.6 mmol/L (ref 3.5–5.2)
Sodium: 144 mmol/L (ref 134–144)
Total Protein: 6.2 g/dL (ref 6.0–8.5)

## 2018-03-02 LAB — VITAMIN D 25 HYDROXY (VIT D DEFICIENCY, FRACTURES): Vit D, 25-Hydroxy: 25.9 ng/mL — ABNORMAL LOW (ref 30.0–100.0)

## 2018-03-02 LAB — TSH: TSH: 3.49 u[IU]/mL (ref 0.450–4.500)

## 2018-03-04 ENCOUNTER — Telehealth: Payer: Self-pay | Admitting: *Deleted

## 2018-03-04 NOTE — Telephone Encounter (Signed)
-----   Message from Birdie Sons, MD sent at 03/02/2018 10:34 PM EDT ----- Kidney functions have declined, need to drink more water. Otherwise labs are normal. Cholesterol is very good. Continue current medications.  Need to schedule follow up in 2- 3 months

## 2018-03-04 NOTE — Telephone Encounter (Signed)
No answer and no vm. Will try again later.  

## 2018-03-05 NOTE — Telephone Encounter (Signed)
Left detailed message on Shannon's vm.

## 2018-03-07 NOTE — Telephone Encounter (Signed)
Pt returned call.  If this is about her labs she wants her labs mailed to her but she also wants someone to call her back.  Thanks  [employer]

## 2018-03-07 NOTE — Telephone Encounter (Signed)
No answer and no vm

## 2018-03-08 NOTE — Telephone Encounter (Signed)
No answer and no vm. Will try again later.  

## 2018-03-14 NOTE — Telephone Encounter (Signed)
Patient was notified of results. Patient scheduled an appt for 04/01/2018, to discuss pain medication.

## 2018-03-29 ENCOUNTER — Encounter: Payer: Self-pay | Admitting: Family Medicine

## 2018-03-29 ENCOUNTER — Ambulatory Visit: Payer: Medicare PPO | Admitting: Family Medicine

## 2018-03-29 VITALS — BP 108/70 | HR 70 | Temp 98.0°F | Resp 16 | Wt 101.0 lb

## 2018-03-29 DIAGNOSIS — G809 Cerebral palsy, unspecified: Secondary | ICD-10-CM

## 2018-03-29 DIAGNOSIS — L03116 Cellulitis of left lower limb: Secondary | ICD-10-CM

## 2018-03-29 DIAGNOSIS — R296 Repeated falls: Secondary | ICD-10-CM

## 2018-03-29 MED ORDER — MUPIROCIN 2 % EX OINT: 1.0000 "application " | TOPICAL_OINTMENT | Freq: Two times a day (BID) | CUTANEOUS | 0 refills | Status: DC

## 2018-03-29 MED ORDER — AMOXICILLIN-POT CLAVULANATE 875-125 MG PO TABS: 1.0000 | ORAL_TABLET | Freq: Two times a day (BID) | ORAL | 0 refills | Status: DC

## 2018-03-29 MED ORDER — HYDROCODONE-ACETAMINOPHEN 7.5-325 MG PO TABS: 2.0000 | ORAL_TABLET | Freq: Three times a day (TID) | ORAL | 0 refills | Status: DC | PRN

## 2018-03-29 NOTE — Progress Notes (Signed)
     Patient: Tina Salinas Female    DOB: 09/09/1945   72 y.o.   MRN: [id number] Visit Date: 03/29/2018  Today's Provider: Donald Fisher, MD   Chief Complaint  Patient presents with  . Hypertension  . Rheumatoid Arthritis  . Fall   Subjective:    HPI  Hypertension, follow-up:  BP Readings from Last 3 Encounters:  03/29/18 108/70  01/07/18 113/67  12/21/17 110/60    She was last seen for hypertension 6 months ago.  BP at that visit was 110/60. Management since that visit includes no changes. She reports good compliance with treatment. She is not having side effects.  She is not exercising. She is adherent to low salt diet.   Outside blood pressures are checked occasionally. She is experiencing lower extremity edema.  Patient denies exertional chest pressure/discomfort and near-syncope.     Weight trend: stable Wt Readings from Last 3 Encounters:  03/29/18 101 lb (45.8 kg)  01/07/18 104 lb 8 oz (47.4 kg)  12/21/17 102 lb (46.3 kg)    Current diet: well balanced   eGFR had dropped under 30 when labs checked a month ago, has since been drinking a few more glasses of water and 2 Ensures every day.    Rheumatoid arthritis (from 09/11/17)- No changes.   Fall Patient reports that she fell 2 weeks ago trying to get into her car. She has swelling, redness, and bruising on her lower left leg.   Allergies  Allergen Reactions  . Etodolac     Other reaction(s): Unknown  . Sulfa Antibiotics   . Tetracycline      Current Outpatient Medications:  .  acyclovir (ZOVIRAX) 800 MG tablet, Take 1 tablet (800 mg total) by mouth 3 (three) times daily as needed., Disp: 270 tablet, Rfl: 2 .  alendronate (FOSAMAX) 70 MG tablet, Take 70 mg by mouth once a week. , Disp: , Rfl:  .  ALPRAZolam (XANAX) 0.25 MG tablet, Take 1 tablet (0.25 mg total) by mouth at bedtime as needed for sleep., Disp: 30 tablet, Rfl: 3 .  amiodarone (PACERONE) 200 MG tablet, Take 200 mg by mouth daily.,  Disp: , Rfl:  .  aspirin 81 MG tablet, Take 81 mg by mouth daily., Disp: , Rfl:  .  carvedilol (COREG) 3.125 MG tablet, Take 1 tablet (3.125 mg total) by mouth 2 (two) times daily., Disp: 60 tablet, Rfl: 3 .  cholecalciferol (VITAMIN D) 1000 UNITS tablet, Take 1,000 Units by mouth daily. , Disp: , Rfl:  .  cyproheptadine (PERIACTIN) 4 MG tablet, Take 1 tablet (4 mg total) by mouth 3 (three) times daily. For appetite, Disp: 60 tablet, Rfl: 5 .  fluticasone (FLONASE) 50 MCG/ACT nasal spray, Place 2 sprays into both nostrils daily., Disp: 16 g, Rfl: 6 .  furosemide (LASIX) 20 MG tablet, Take 1 tablet (20 mg total) by mouth daily., Disp: 30 tablet, Rfl: 1 .  HYDROcodone-acetaminophen (NORCO) 7.5-325 MG tablet, Take 2 tablets by mouth 3 (three) times daily as needed for moderate pain., Disp: 540 tablet, Rfl: 0 .  hydroxychloroquine (PLAQUENIL) 200 MG tablet, Take 400 mg by mouth daily. , Disp: , Rfl:  .  latanoprost (XALATAN) 0.005 % ophthalmic solution, Place 1 drop into both eyes at bedtime., Disp: , Rfl:  .  Methotrexate, PF, 25 MG/0.5ML SOAJ, Inject 1.4 mLs into the skin once a week., Disp: , Rfl:  .  Methylcellulose, Laxative, (GNP FIBER THERAPY PO), Take 1 Dose by mouth daily   as needed (constipation). , Disp: , Rfl:  .  oxybutynin (DITROPAN) 5 MG tablet, TAKE 1 TABLET TWICE DAILY AS NEEDED  FOR  BLADDER  SPASMS, Disp: 180 tablet, Rfl: 4 .  pravastatin (PRAVACHOL) 40 MG tablet, Take 1 tablet (40 mg total) by mouth at bedtime., Disp: 30 tablet, Rfl: 0 .  predniSONE (DELTASONE) 5 MG tablet, Take 5 mg by mouth daily with breakfast., Disp: , Rfl:   Review of Systems  Constitutional: Positive for fatigue. Negative for activity change, appetite change, chills and diaphoresis.  Respiratory: Negative for cough and shortness of breath.   Cardiovascular: Positive for leg swelling.  Musculoskeletal: Positive for arthralgias, back pain, gait problem and myalgias.  Skin: Positive for color change and wound.  Negative for pallor and rash.  Hematological: Bruises/bleeds easily.    Social History   Tobacco Use  . Smoking status: Never Smoker  . Smokeless tobacco: Never Used  Substance Use Topics  . Alcohol use: No   Objective:   BP 108/70 (BP Location: Right Arm, Patient Position: Sitting, Cuff Size: Small)   Pulse 70   Temp 98 F (36.7 C)   Resp 16   Wt 101 lb (45.8 kg)   SpO2 99%   BMI 19.08 kg/m  Vitals:   03/29/18 1358  BP: 108/70  Pulse: 70  Resp: 16  Temp: 98 F (36.7 C)  SpO2: 99%  Weight: 101 lb (45.8 kg)     Physical Exam   General Appearance:    Alert, cooperative, no distress  Eyes:    PERRL, conjunctiva/corneas clear, EOM's intact       Lungs:     Clear to auscultation bilaterally, respirations unlabored  Heart:    Regular rate and rhythm  Skin:  large bullous lesion, tender to touch left anterior lower leg with small amount of erythema. No active discharge, but patient reports has been seeping.          Assessment & Plan:     1. Cerebral palsy, unspecified type ([place])  - Ambulatory referral to Home Health  2. Falling episodes  - Ambulatory referral to Home Health  3. Cellulitis of left lower extremity  - mupirocin ointment (BACTROBAN) 2 %; Apply 1 application topically 2 (two) times daily.  Dispense: 22 g; Refill: 0 - amoxicillin-clavulanate (AUGMENTIN) 875-125 MG tablet; Take 1 tablet by mouth 2 (two) times daily for 7 days.  Dispense: 14 tablet; Refill: 0 - Aerobic culture       Lelon Huh, MD  [facility]

## 2018-04-01 ENCOUNTER — Telehealth: Payer: Self-pay | Admitting: Family Medicine

## 2018-04-01 ENCOUNTER — Ambulatory Visit: Payer: Self-pay | Admitting: Family Medicine

## 2018-04-01 LAB — AEROBIC CULTURE

## 2018-04-01 NOTE — Telephone Encounter (Signed)
Spoke to patient earlier on the phone and she refused to give me the information. Patient requested RARA call her back.

## 2018-04-01 NOTE — Telephone Encounter (Signed)
Tried calling patient and no answer. Unable to leave VM due to mailbox being full.

## 2018-04-01 NOTE — Telephone Encounter (Signed)
Pt request one of Dr. Maralyn Sago CMA return her call. Pt stated it is about her physical therapy but wouldn't give further information. Please advise. Thanks TNP

## 2018-04-02 ENCOUNTER — Telehealth: Payer: Self-pay | Admitting: Family Medicine

## 2018-04-02 DIAGNOSIS — R531 Weakness: Secondary | ICD-10-CM

## 2018-04-02 DIAGNOSIS — G809 Cerebral palsy, unspecified: Secondary | ICD-10-CM

## 2018-04-02 DIAGNOSIS — R296 Repeated falls: Secondary | ICD-10-CM

## 2018-04-02 NOTE — Telephone Encounter (Signed)
Pt called saying she was in last Friday to see Dr. Caryn Section. She is requesting to have a home health nurse come to see her.  She is having a lot of fatigue and feels dehydrated.  She does not want to come in the office.  Her call back is [phone]  Thanks  teri

## 2018-04-02 NOTE — Telephone Encounter (Signed)
Left message to call back  

## 2018-04-02 NOTE — Telephone Encounter (Signed)
Pt returned call  Pt CB# [phone]  Thanks teri

## 2018-04-03 NOTE — Telephone Encounter (Signed)
She is already followed by [facility].

## 2018-04-05 NOTE — Telephone Encounter (Signed)
Spoke with the patient and she reports that she never heard from the home health nurse or the physical therapist this week. I saw a note in her chart that the services were supposed to start on 9/16 (on Monday). Patient reports that neither one has been to her home.   Patient also reports that the wound on her leg is not doing any better. I thought we scheduled a F/U appt before she left, and she reports that we did not. She is almost done with the abx. She reports that she was treated with Augmentin. Should patient come in to be re-evaluated? Please advise. Thanks!

## 2018-04-05 NOTE — Telephone Encounter (Signed)
Called [facility] (not [facility]) to see why they did not come to patient's home. I was transferred 3 times, then had to leave a voicemail. Expecting a call back from them soon.   Advised patient that we are looking into this. Appt was scheduled next week to re-evaluated her leg.

## 2018-04-05 NOTE — Telephone Encounter (Signed)
See Message from 04/05/18.

## 2018-04-05 NOTE — Telephone Encounter (Signed)
Please call her home health agency to make sure they are scheduled to come out.   Continue mupirocin and Augmentin for wound on leg. Schedule follow up around the middle of next week to check on it.

## 2018-04-08 ENCOUNTER — Telehealth: Payer: Self-pay

## 2018-04-08 ENCOUNTER — Telehealth: Payer: Self-pay | Admitting: Family Medicine

## 2018-04-08 NOTE — Telephone Encounter (Signed)
Tina Salinas with [facility] called saying they Are calling the patient to see where she is living right now.  [facility] states if she is living in [place] they will be doing her care.  They will call us back when they find out more information  teri

## 2018-04-08 NOTE — Telephone Encounter (Signed)
Please advise 

## 2018-04-08 NOTE — Telephone Encounter (Signed)
Aur[place] with [facility] is requesting verbal orders for in home physical therapy as follows:  Twice a week for 4 weeks  2. Suezanne Jacquet is also requesting verbal orders for skilled nursing to eval pt for in home wound care for a wound on inside lower leg due to fall and also medication education. Please advise. Thanks TNP

## 2018-04-08 NOTE — Telephone Encounter (Signed)
Tina Salinas was given verbal order.

## 2018-04-08 NOTE — Telephone Encounter (Signed)
That's fine

## 2018-04-08 NOTE — Telephone Encounter (Signed)
LMTCB and schedule AWV prior to CPE on 07/15/18. -MM

## 2018-04-10 ENCOUNTER — Telehealth: Payer: Self-pay | Admitting: Family Medicine

## 2018-04-10 NOTE — Telephone Encounter (Signed)
!  Needing verbal orders for:  Wound care to paint w/ iodine daily.  Nurse to see pt 1 time a week for 5 weeks.  Thanks, [employer]

## 2018-04-10 NOTE — Telephone Encounter (Signed)
Please advise 

## 2018-04-10 NOTE — Telephone Encounter (Signed)
That's fine

## 2018-04-11 NOTE — Telephone Encounter (Signed)
Left detailed message on Shannon's vm giving verbal ok.

## 2018-04-12 ENCOUNTER — Ambulatory Visit: Payer: Medicare PPO | Admitting: Family Medicine

## 2018-04-12 ENCOUNTER — Encounter: Payer: Self-pay | Admitting: Family Medicine

## 2018-04-12 DIAGNOSIS — L03116 Cellulitis of left lower limb: Secondary | ICD-10-CM

## 2018-04-12 MED ORDER — AMOXICILLIN-POT CLAVULANATE 875-125 MG PO TABS: 1.0000 | ORAL_TABLET | Freq: Two times a day (BID) | ORAL | 0 refills | Status: AC

## 2018-04-12 MED ORDER — FLUCONAZOLE 150 MG PO TABS: 150.0000 mg | ORAL_TABLET | Freq: Once | ORAL | 1 refills | Status: DC

## 2018-04-12 NOTE — Progress Notes (Signed)
Patient: Tina Salinas Female    DOB: 11/26/1945   72 y.o.   MRN: [id number] Visit Date: 04/12/2018  Today's Provider: Lelon Huh, MD   Chief Complaint  Patient presents with  . Follow-up    wound   Subjective:    HPI  Cellulitis of left lower extremity From 03/29/2018. Developed sore on left lower leg after falling onto leg 2 weeks prior to that visit. She was started on  BACTROBAN) 2 % ointment and amoxicillin-clavulanate (AUGMENTIN) 875-125 MG tablet. Aerobic culture was negative.   Patient completed antibiotic but is still using ointment occasionally. She is not using the ointment all the time.patietn has been putting iodine on the wound instead. Wound not better.    Allergies  Allergen Reactions  . Etodolac     Other reaction(s): Unknown  . Sulfa Antibiotics   . Tetracycline      Current Outpatient Medications:  .  acyclovir (ZOVIRAX) 800 MG tablet, Take 1 tablet (800 mg total) by mouth 3 (three) times daily as needed., Disp: 270 tablet, Rfl: 2 .  alendronate (FOSAMAX) 70 MG tablet, Take 70 mg by mouth once a week. , Disp: , Rfl:  .  ALPRAZolam (XANAX) 0.25 MG tablet, Take 1 tablet (0.25 mg total) by mouth at bedtime as needed for sleep., Disp: 30 tablet, Rfl: 3 .  amiodarone (PACERONE) 200 MG tablet, Take 200 mg by mouth daily., Disp: , Rfl:  .  aspirin 81 MG tablet, Take 81 mg by mouth daily., Disp: , Rfl:  .  cholecalciferol (VITAMIN D) 1000 UNITS tablet, Take 1,000 Units by mouth daily. , Disp: , Rfl:  .  cyproheptadine (PERIACTIN) 4 MG tablet, Take 1 tablet (4 mg total) by mouth 3 (three) times daily. For appetite, Disp: 60 tablet, Rfl: 5 .  fluticasone (FLONASE) 50 MCG/ACT nasal spray, Place 2 sprays into both nostrils daily., Disp: 16 g, Rfl: 6 .  furosemide (LASIX) 20 MG tablet, Take 1 tablet (20 mg total) by mouth daily., Disp: 30 tablet, Rfl: 1 .  HYDROcodone-acetaminophen (NORCO) 7.5-325 MG tablet, Take 2 tablets by mouth 3 (three) times daily as  needed for moderate pain., Disp: 540 tablet, Rfl: 0 .  hydroxychloroquine (PLAQUENIL) 200 MG tablet, Take 400 mg by mouth daily. , Disp: , Rfl:  .  latanoprost (XALATAN) 0.005 % ophthalmic solution, Place 1 drop into both eyes at bedtime., Disp: , Rfl:  .  Methotrexate, PF, 25 MG/0.5ML SOAJ, Inject 1.4 mLs into the skin once a week., Disp: , Rfl:  .  Methylcellulose, Laxative, (GNP FIBER THERAPY PO), Take 1 Dose by mouth daily as needed (constipation). , Disp: , Rfl:  .  mupirocin ointment (BACTROBAN) 2 %, Apply 1 application topically 2 (two) times daily., Disp: 22 g, Rfl: 0 .  oxybutynin (DITROPAN) 5 MG tablet, TAKE 1 TABLET TWICE DAILY AS NEEDED  FOR  BLADDER  SPASMS, Disp: 180 tablet, Rfl: 4 .  pravastatin (PRAVACHOL) 40 MG tablet, Take 1 tablet (40 mg total) by mouth at bedtime., Disp: 30 tablet, Rfl: 0 .  predniSONE (DELTASONE) 5 MG tablet, Take 5 mg by mouth daily with breakfast., Disp: , Rfl:  .  carvedilol (COREG) 3.125 MG tablet, Take 1 tablet (3.125 mg total) by mouth 2 (two) times daily., Disp: 60 tablet, Rfl: 3  Review of Systems  Constitutional: Negative for appetite change, chills, fatigue and fever.  Respiratory: Negative for chest tightness and shortness of breath.   Cardiovascular: Negative for chest  pain and palpitations.  Gastrointestinal: Negative for abdominal pain, nausea and vomiting.  Skin: Positive for wound.  Neurological: Negative for dizziness and weakness.    Social History   Tobacco Use  . Smoking status: Never Smoker  . Smokeless tobacco: Never Used  Substance Use Topics  . Alcohol use: No   Objective:   BP 103/63 (BP Location: Right Arm, Patient Position: Sitting, Cuff Size: Small)   Pulse 66   Temp 98.3 F (36.8 C) (Oral)   Resp 16   Wt 102 lb 6.4 oz (46.4 kg)   SpO2 98%   BMI 19.35 kg/m  Vitals:   04/12/18 1507  BP: 103/63  Pulse: 66  Resp: 16  Temp: 98.3 F (36.8 C)  TempSrc: Oral  SpO2: 98%  Weight: 102 lb 6.4 oz (46.4 kg)      Physical Exam  Slightly tender, dry healing wound left anterior leg smaller and less erythematous that previous exam. See Photo.     Assessment & Plan:     1. Cellulitis of left lower extremity Consdierably improved, but not resolved. Start back on Augmentin and recheck at follow up appointment.  - amoxicillin-clavulanate (AUGMENTIN) 875-125 MG tablet; Take 1 tablet by mouth 2 (two) times daily for 10 days.  Dispense: 20 tablet; Refill: 0       Lelon Huh, MD  [facility]

## 2018-04-16 DIAGNOSIS — Z7952 Long term (current) use of systemic steroids: Secondary | ICD-10-CM

## 2018-04-16 DIAGNOSIS — Z9181 History of falling: Secondary | ICD-10-CM

## 2018-04-16 DIAGNOSIS — S8992XD Unspecified injury of left lower leg, subsequent encounter: Secondary | ICD-10-CM | POA: Diagnosis not present

## 2018-04-16 DIAGNOSIS — M069 Rheumatoid arthritis, unspecified: Secondary | ICD-10-CM | POA: Diagnosis not present

## 2018-04-16 DIAGNOSIS — I10 Essential (primary) hypertension: Secondary | ICD-10-CM

## 2018-04-16 DIAGNOSIS — L03116 Cellulitis of left lower limb: Secondary | ICD-10-CM

## 2018-04-16 DIAGNOSIS — G809 Cerebral palsy, unspecified: Secondary | ICD-10-CM

## 2018-05-08 ENCOUNTER — Telehealth: Payer: Self-pay | Admitting: Family Medicine

## 2018-05-08 NOTE — Telephone Encounter (Signed)
Tina Salinas with [facility] is requesting verbal orders for in home nursing visits for wound care as follows:  1. Would like to change the dressing to silver alginate and for it to be changed every 3 days.  2. Tina Salinas would like to see pt in home for nursing visits once a week for 4 weeks.  Please advise. Thanks TNP

## 2018-05-08 NOTE — Telephone Encounter (Signed)
Tina Salinas w/ [facility] was given verbal order per Dr.Fisher.

## 2018-05-08 NOTE — Telephone Encounter (Signed)
That's fine

## 2018-05-10 NOTE — Telephone Encounter (Signed)
This encounter was created in error - please disregard.

## 2018-05-13 ENCOUNTER — Telehealth: Payer: Self-pay | Admitting: Family Medicine

## 2018-05-13 NOTE — Telephone Encounter (Signed)
This encounter was created in error - please disregard.

## 2018-05-13 NOTE — Telephone Encounter (Signed)
Patient states she will call back at a later time to reschedule her follow-up appointment.,PC

## 2018-05-13 NOTE — Telephone Encounter (Signed)
Pt needing to talk with Dr. Caryn Section nurse.  Did not want to discuss why. Also wants to know what her next vist w/ Dr. Caryn Section is for.  Pease call pt back.  Thanks, [employer]

## 2018-05-13 NOTE — Telephone Encounter (Signed)
Pt wanting a call asap today.

## 2018-05-14 ENCOUNTER — Ambulatory Visit: Payer: Self-pay | Admitting: Family Medicine

## 2018-05-15 ENCOUNTER — Telehealth: Payer: Self-pay | Admitting: Family Medicine

## 2018-05-15 NOTE — Telephone Encounter (Signed)
Tina Salinas called stating patient feel last week and now when she takes a deep breath, it hurts and with movement.  Also hurts in lower back, coccyx area.  Wants to get a mobile chest xray done and lower back/coccyx area.

## 2018-05-15 NOTE — Telephone Encounter (Signed)
[name] advised.  She also needs a verbal okay to change Ms. Schoening's wound daily (pack wet to dry)  Thanks,   -Mickel Baas

## 2018-05-15 NOTE — Telephone Encounter (Signed)
Okay to order?  Thanks,   -Valaree Fresquez  

## 2018-05-15 NOTE — Telephone Encounter (Signed)
That's fine

## 2018-05-15 NOTE — Telephone Encounter (Signed)
Left message advising Shannon  Thanks,   -Annlouise Gerety  

## 2018-05-16 ENCOUNTER — Telehealth: Payer: Self-pay | Admitting: Family Medicine

## 2018-05-16 NOTE — Telephone Encounter (Signed)
Pt is requesting Dr. Caryn Section look at her chart and make sure it is okay for her to get a flu shot before she comes in on 11/6 @ 2 pm.  States, she spoke to her heart doctor and they told her to talk to Dr. Caryn Section about which one to get.

## 2018-05-16 NOTE — Telephone Encounter (Signed)
Patient was advised.,PC 

## 2018-05-16 NOTE — Telephone Encounter (Signed)
Yes, she can get the high dose flu vaccine.

## 2018-05-20 ENCOUNTER — Ambulatory Visit: Payer: Self-pay

## 2018-05-20 ENCOUNTER — Encounter: Payer: Self-pay | Admitting: Family Medicine

## 2018-05-21 ENCOUNTER — Telehealth: Payer: Self-pay | Admitting: Family Medicine

## 2018-05-21 DIAGNOSIS — I10 Essential (primary) hypertension: Secondary | ICD-10-CM

## 2018-05-21 DIAGNOSIS — N179 Acute kidney failure, unspecified: Secondary | ICD-10-CM

## 2018-05-21 NOTE — Telephone Encounter (Signed)
Please print and leave order for her at lab. Keep appointment scheduled for December.

## 2018-05-21 NOTE — Telephone Encounter (Signed)
Pt maybe due to have have her kidney function rechecked, she is also due for an office visit.  (See lab notes for 03/01/2018).   Please advise.  Thanks,   -Mickel Baas

## 2018-05-21 NOTE — Telephone Encounter (Signed)
Pt request to speak with CMA. Pt asked about scheduling blood work. I tried to explain that we do not make lab appts and asked if she needed to see Dr. Caryn Section for a F/U visit. Pt stated no that she needed to have her labs rechecked and we do make lab appts. I asked pt if she was talking about having her blood drawn by [facility] in our office. Pt stated yes. I offered to send a message to Dr. Caryn Section to request lab orders. Pt seemed to get frustrated and stated she didn't need that she just needed to speak with one of Dr. Maralyn Sago nurses and requested they return her call. Please advise. Thanks TNP

## 2018-05-22 ENCOUNTER — Ambulatory Visit (INDEPENDENT_AMBULATORY_CARE_PROVIDER_SITE_OTHER): Payer: Medicare PPO

## 2018-05-22 DIAGNOSIS — Z23 Encounter for immunization: Secondary | ICD-10-CM

## 2018-05-22 NOTE — Telephone Encounter (Signed)
Done.   Thanks,   -Laura  

## 2018-05-31 NOTE — Telephone Encounter (Signed)
This encounter was created in error - please disregard.

## 2018-06-04 ENCOUNTER — Ambulatory Visit: Payer: Self-pay | Admitting: Family Medicine

## 2018-06-06 ENCOUNTER — Telehealth: Payer: Self-pay | Admitting: Family Medicine

## 2018-06-06 NOTE — Telephone Encounter (Signed)
[name], RN w/ [facility] - [phone]  Need verbals: Recertification 1 time a week for 5 weeks for wound care  Thanks, [facility]

## 2018-06-06 NOTE — Telephone Encounter (Signed)
That's fine

## 2018-06-06 NOTE — Telephone Encounter (Signed)
Scheduled AWV for 07/15/18 @ 120 pm. Apt is prior to CPE @ 2 with Dr Caryn Section.  -MM

## 2018-06-07 ENCOUNTER — Ambulatory Visit: Payer: Medicare PPO | Admitting: Family Medicine

## 2018-06-07 ENCOUNTER — Encounter: Payer: Self-pay | Admitting: Family Medicine

## 2018-06-07 VITALS — BP 102/60 | HR 67 | Temp 97.9°F | Resp 18 | Wt 98.0 lb

## 2018-06-07 DIAGNOSIS — L97929 Non-pressure chronic ulcer of unspecified part of left lower leg with unspecified severity: Secondary | ICD-10-CM

## 2018-06-07 NOTE — Progress Notes (Signed)
Patient: Tina Salinas Female    DOB: 1945/08/22   72 y.o.   MRN: [id number] Visit Date: 06/07/2018  Today's Provider: Lelon Huh, MD   Chief Complaint  Patient presents with  . Follow-up   Subjective:    HPI Cellulitis: Patient comes in today for a recheck of cellulitis of the left lower extremity. She was last seen for this problem about 2 months ago.  During that visit, patient was started back on Augmentin and advised to follow up in 1 month. Today patient reports her good compliance with treatment and good symptom control. Home health is now doing home wound care which she feels is helpful.     Allergies  Allergen Reactions  . Etodolac     Other reaction(s): Unknown  . Sulfa Antibiotics   . Tetracycline    Patient did not bring medications in to review. She states "Nothing has changed"  Current Outpatient Medications:  .  acyclovir (ZOVIRAX) 800 MG tablet, Take 1 tablet (800 mg total) by mouth 3 (three) times daily as needed., Disp: 270 tablet, Rfl: 2 .  alendronate (FOSAMAX) 70 MG tablet, Take 70 mg by mouth once a week. , Disp: , Rfl:  .  ALPRAZolam (XANAX) 0.25 MG tablet, Take 1 tablet (0.25 mg total) by mouth at bedtime as needed for sleep., Disp: 30 tablet, Rfl: 3 .  amiodarone (PACERONE) 200 MG tablet, Take 200 mg by mouth daily., Disp: , Rfl:  .  aspirin 81 MG tablet, Take 81 mg by mouth daily., Disp: , Rfl:  .  carvedilol (COREG) 3.125 MG tablet, Take 1 tablet (3.125 mg total) by mouth 2 (two) times daily., Disp: 60 tablet, Rfl: 3 .  cholecalciferol (VITAMIN D) 1000 UNITS tablet, Take 1,000 Units by mouth daily. , Disp: , Rfl:  .  cyproheptadine (PERIACTIN) 4 MG tablet, Take 1 tablet (4 mg total) by mouth 3 (three) times daily. For appetite, Disp: 60 tablet, Rfl: 5 .  fluticasone (FLONASE) 50 MCG/ACT nasal spray, Place 2 sprays into both nostrils daily., Disp: 16 g, Rfl: 6 .  furosemide (LASIX) 20 MG tablet, Take 1 tablet (20 mg total) by mouth daily.,  Disp: 30 tablet, Rfl: 1 .  HYDROcodone-acetaminophen (NORCO) 7.5-325 MG tablet, Take 2 tablets by mouth 3 (three) times daily as needed for moderate pain., Disp: 540 tablet, Rfl: 0 .  hydroxychloroquine (PLAQUENIL) 200 MG tablet, Take 400 mg by mouth daily. , Disp: , Rfl:  .  latanoprost (XALATAN) 0.005 % ophthalmic solution, Place 1 drop into both eyes at bedtime., Disp: , Rfl:  .  Methotrexate, PF, 25 MG/0.5ML SOAJ, Inject 1.4 mLs into the skin once a week., Disp: , Rfl:  .  Methylcellulose, Laxative, (GNP FIBER THERAPY PO), Take 1 Dose by mouth daily as needed (constipation). , Disp: , Rfl:  .  mupirocin ointment (BACTROBAN) 2 %, Apply 1 application topically 2 (two) times daily., Disp: 22 g, Rfl: 0 .  oxybutynin (DITROPAN) 5 MG tablet, TAKE 1 TABLET TWICE DAILY AS NEEDED  FOR  BLADDER  SPASMS, Disp: 180 tablet, Rfl: 4 .  pravastatin (PRAVACHOL) 40 MG tablet, Take 1 tablet (40 mg total) by mouth at bedtime., Disp: 30 tablet, Rfl: 0 .  predniSONE (DELTASONE) 5 MG tablet, Take 5 mg by mouth daily with breakfast., Disp: , Rfl:   Review of Systems  Constitutional: Negative for appetite change, chills, fatigue and fever.  Respiratory: Negative for chest tightness and shortness of breath.  Cardiovascular: Negative for chest pain and palpitations.  Gastrointestinal: Negative for abdominal pain, nausea and vomiting.  Neurological: Negative for dizziness and weakness.    Social History   Tobacco Use  . Smoking status: Never Smoker  . Smokeless tobacco: Never Used  Substance Use Topics  . Alcohol use: No   Objective:   BP 102/60 (BP Location: Right Arm, Patient Position: Sitting, Cuff Size: Normal)   Pulse 67   Temp 97.9 F (36.6 C) (Oral)   Resp 18   Wt 98 lb (44.5 kg)   SpO2 95% Comment: room air  BMI 18.52 kg/m     Physical Exam  General appearance: alert, well developed, well nourished, cooperative and in no distress Head: Normocephalic, without obvious abnormality,  atraumatic Respiratory: Respirations even and unlabored, normal respiratory rate Extremities:Ulceration of anterior left  Lower leg, see photo. No significant surrounding erythema.     Assessment & Plan:     1. Skin ulcer of left lower leg, unspecified ulcer stage (HCC) No sign of persistent cellulitis. Continue home health wound care       Lelon Huh, MD  [facility]

## 2018-06-07 NOTE — Telephone Encounter (Signed)
[name] was given verbal orders.

## 2018-06-21 ENCOUNTER — Ambulatory Visit: Payer: Self-pay | Admitting: Family Medicine

## 2018-06-28 ENCOUNTER — Telehealth: Payer: Self-pay | Admitting: Family Medicine

## 2018-06-28 DIAGNOSIS — M069 Rheumatoid arthritis, unspecified: Secondary | ICD-10-CM

## 2018-06-28 DIAGNOSIS — Z9181 History of falling: Secondary | ICD-10-CM

## 2018-06-28 DIAGNOSIS — L03116 Cellulitis of left lower limb: Secondary | ICD-10-CM

## 2018-06-28 DIAGNOSIS — I10 Essential (primary) hypertension: Secondary | ICD-10-CM

## 2018-06-28 DIAGNOSIS — S8992XD Unspecified injury of left lower leg, subsequent encounter: Secondary | ICD-10-CM

## 2018-06-28 MED ORDER — FLUCONAZOLE 150 MG PO TABS: 150.0000 mg | ORAL_TABLET | Freq: Once | ORAL | 1 refills | Status: AC

## 2018-06-28 NOTE — Telephone Encounter (Signed)
Patient wants a Diflucan called in TODAY for yeast infection to [facility]

## 2018-07-08 ENCOUNTER — Telehealth: Payer: Self-pay

## 2018-07-08 NOTE — Telephone Encounter (Signed)
[name] advised.   Thanks,   -Mickel Baas

## 2018-07-08 NOTE — Telephone Encounter (Signed)
[name] with [facility] is requesting a verbal order to extend wound care for once a week for 4 weeks. CB# [phone]

## 2018-07-08 NOTE — Telephone Encounter (Signed)
That's fine. Thanks!

## 2018-07-08 NOTE — Telephone Encounter (Signed)
Please Review

## 2018-07-15 ENCOUNTER — Ambulatory Visit (INDEPENDENT_AMBULATORY_CARE_PROVIDER_SITE_OTHER): Payer: Medicare PPO | Admitting: Family Medicine

## 2018-07-15 ENCOUNTER — Telehealth: Payer: Self-pay | Admitting: Family Medicine

## 2018-07-15 ENCOUNTER — Ambulatory Visit: Payer: Self-pay

## 2018-07-15 ENCOUNTER — Encounter: Payer: Self-pay | Admitting: Family Medicine

## 2018-07-15 VITALS — BP 98/57 | HR 61 | Temp 97.7°F | Resp 16 | Wt 98.4 lb

## 2018-07-15 DIAGNOSIS — Z1159 Encounter for screening for other viral diseases: Secondary | ICD-10-CM | POA: Diagnosis not present

## 2018-07-15 DIAGNOSIS — Z0000 Encounter for general adult medical examination without abnormal findings: Secondary | ICD-10-CM

## 2018-07-15 DIAGNOSIS — N183 Chronic kidney disease, stage 3 unspecified: Secondary | ICD-10-CM

## 2018-07-15 DIAGNOSIS — Z860101 Personal history of adenomatous and serrated colon polyps: Secondary | ICD-10-CM

## 2018-07-15 DIAGNOSIS — Z86010 Personal history of colonic polyps: Secondary | ICD-10-CM

## 2018-07-15 DIAGNOSIS — E038 Other specified hypothyroidism: Secondary | ICD-10-CM

## 2018-07-15 DIAGNOSIS — E039 Hypothyroidism, unspecified: Secondary | ICD-10-CM

## 2018-07-15 DIAGNOSIS — L97929 Non-pressure chronic ulcer of unspecified part of left lower leg with unspecified severity: Secondary | ICD-10-CM

## 2018-07-15 MED ORDER — HYDROCODONE-ACETAMINOPHEN 7.5-325 MG PO TABS: 2.0000 | ORAL_TABLET | Freq: Three times a day (TID) | ORAL | 0 refills | Status: DC | PRN
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
## 2018-07-15 NOTE — Telephone Encounter (Signed)
I spoke with the patient and let her know to come in at 2:00 this afternoon instead of 1:20 due to McKenzie being out sick. VDM (DD)
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
## 2018-07-15 NOTE — Patient Instructions (Addendum)
.   Please bring all of your medications to every appointment so we can make sure that our medication list is the same as yours.    The CDC recommends two doses of Shingrix (the shingles vaccine) separated by 2 to 6 months for adults age 70 years and older. I recommend checking with your pharmacy plan regarding coverage for this vaccine.   . Please contact your eyecare professional to schedule a routine eye exam  . Please call the [facility] [phone] to schedule a routine screening mammogram.

## 2018-07-16 ENCOUNTER — Telehealth: Payer: Self-pay

## 2018-07-16 LAB — RENAL FUNCTION PANEL
ALBUMIN: 4.1 g/dL (ref 3.5–4.8)
BUN/Creatinine Ratio: 14 (ref 12–28)
BUN: 18 mg/dL (ref 8–27)
CALCIUM: 8.9 mg/dL (ref 8.7–10.3)
CHLORIDE: 105 mmol/L (ref 96–106)
CO2: 21 mmol/L (ref 20–29)
Creatinine, Ser: 1.29 mg/dL — ABNORMAL HIGH (ref 0.57–1.00)
GFR calc non Af Amer: 42 mL/min/{1.73_m2} — ABNORMAL LOW (ref >59)
GFR, EST AFRICAN AMERICAN: 48 mL/min/{1.73_m2} — AB (ref >59)
GLUCOSE: 59 mg/dL — AB (ref 65–99)
PHOSPHORUS: 4.2 mg/dL (ref 2.5–4.5)
POTASSIUM: 3.5 mmol/L (ref 3.5–5.2)
Sodium: 143 mmol/L (ref 134–144)

## 2018-07-16 LAB — HEPATITIS C ANTIBODY: Hep C Virus Ab: <0.1 s/co ratio (ref 0.0–0.9)

## 2018-07-16 LAB — TSH: TSH: 2.44 u[IU]/mL (ref 0.450–4.500)

## 2018-07-16 NOTE — Telephone Encounter (Signed)
Patient advised of results and states she would call back to schedule follow up appointment. Patient wants a copy of these labs mailed to her P.O. box mailing address. Please advise if its "ok" to mail labs.

## 2018-07-16 NOTE — Telephone Encounter (Signed)
-----   Message from Birdie Sons, MD sent at 07/16/2018  9:26 AM EST ----- Kidney functions are better, but not quite back to normal. Need to drink more fluids. Otherwise labs normal. Follow up 6 months.

## 2018-07-18 ENCOUNTER — Telehealth: Payer: Self-pay | Admitting: Family Medicine

## 2018-07-18 MED ORDER — COLLAGENASE 250 UNIT/GM EX OINT: 1.0000 "application " | TOPICAL_OINTMENT | Freq: Every day | CUTANEOUS | 3 refills | Status: DC

## 2018-07-18 NOTE — Telephone Encounter (Signed)
That's fine

## 2018-07-18 NOTE — Telephone Encounter (Signed)
Have sent prescription for 90gm tube to [facility]

## 2018-07-18 NOTE — Telephone Encounter (Signed)
Pt request SANTYL Ointment Large Tube sent to [facility].  States a nurse from [facility] suggested she get the ointment.  Insurance will not cover and that is why she is wanting to have the large container.

## 2018-07-22 ENCOUNTER — Telehealth: Payer: Self-pay

## 2018-07-22 DIAGNOSIS — L97929 Non-pressure chronic ulcer of unspecified part of left lower leg with unspecified severity: Secondary | ICD-10-CM

## 2018-07-22 NOTE — Telephone Encounter (Signed)
Tina Salinas from [facility] is calling requesting orders for would care to be changed. Tina Salinas states that she would like to change it to [facility] with wet to dry changes daily. Also Tina Salinas states that wound care nurse recommended that patient request order from PCP  To see a Vein and Vascular doctor to have ABI performed. Please advise. KW

## 2018-07-22 NOTE — Telephone Encounter (Signed)
[facility] for requested wound care orders,  Have entered order for vascular referral.

## 2018-07-22 NOTE — Telephone Encounter (Signed)
[name] advised.   Thanks,   -Mickel Baas

## 2018-07-29 ENCOUNTER — Telehealth: Payer: Self-pay | Admitting: Family Medicine

## 2018-07-29 DIAGNOSIS — I2510 Atherosclerotic heart disease of native coronary artery without angina pectoris: Secondary | ICD-10-CM

## 2018-07-29 DIAGNOSIS — L97929 Non-pressure chronic ulcer of unspecified part of left lower leg with unspecified severity: Secondary | ICD-10-CM

## 2018-07-29 NOTE — Telephone Encounter (Signed)
The referral to [facility] was cancelled due to our office not providing info they needed regarding referral.  [name] wants to get that referral started again and please put in referral why are seeing patient and what test you want ordered on Ms. Tina Salinas.

## 2018-08-01 DIAGNOSIS — H1045 Other chronic allergic conjunctivitis: Secondary | ICD-10-CM | POA: Diagnosis not present

## 2018-08-14 ENCOUNTER — Telehealth: Payer: Self-pay | Admitting: Family Medicine

## 2018-08-14 NOTE — Telephone Encounter (Signed)
Spoke to pt and she advised that she isn't able to go to vein clinic in feb Tina Salinas to her having a lot of appt's and can't afford to pay all the co-pays.  The vein clinic advised pt that she would need another referral for another visit and pt is asking if you can send in another referral for march and April because she isn't sure when she will be able to go.  Pt advised that her leg is getting better with the medication and the place on her leg is getting smaller.  Pt advised she will call back and let us know something.   dbs

## 2018-08-14 NOTE — Telephone Encounter (Signed)
Pt needing to discuss her referral with a nurse.  Pt did not want to disclose info. Please call pt back.  Thanks, [employer]

## 2018-08-26 ENCOUNTER — Telehealth: Payer: Self-pay

## 2018-08-26 DIAGNOSIS — I10 Essential (primary) hypertension: Secondary | ICD-10-CM | POA: Diagnosis not present

## 2018-08-26 DIAGNOSIS — S8992XD Unspecified injury of left lower leg, subsequent encounter: Secondary | ICD-10-CM | POA: Diagnosis not present

## 2018-08-26 DIAGNOSIS — Z9181 History of falling: Secondary | ICD-10-CM | POA: Diagnosis not present

## 2018-08-26 DIAGNOSIS — M069 Rheumatoid arthritis, unspecified: Secondary | ICD-10-CM | POA: Diagnosis not present

## 2018-08-26 DIAGNOSIS — Z7982 Long term (current) use of aspirin: Secondary | ICD-10-CM

## 2018-08-26 DIAGNOSIS — G809 Cerebral palsy, unspecified: Secondary | ICD-10-CM | POA: Diagnosis not present

## 2018-08-26 DIAGNOSIS — Z7952 Long term (current) use of systemic steroids: Secondary | ICD-10-CM

## 2018-08-26 NOTE — Telephone Encounter (Signed)
Left message advising Tina Salinas  Thanks,   -Mickel Baas

## 2018-08-26 NOTE — Telephone Encounter (Signed)
That's fine

## 2018-08-26 NOTE — Telephone Encounter (Signed)
Tina Salinas from [facility] has called asking to get order to continue [place] 2 x wk for 5 wks. CB@ [phone]

## 2018-09-04 DIAGNOSIS — M0579 Rheumatoid arthritis with rheumatoid factor of multiple sites without organ or systems involvement: Secondary | ICD-10-CM | POA: Diagnosis not present

## 2018-09-04 DIAGNOSIS — L84 Corns and callosities: Secondary | ICD-10-CM | POA: Insufficient documentation

## 2018-09-04 DIAGNOSIS — L909 Atrophic disorder of skin, unspecified: Secondary | ICD-10-CM | POA: Diagnosis not present

## 2018-09-04 DIAGNOSIS — M79671 Pain in right foot: Secondary | ICD-10-CM | POA: Diagnosis not present

## 2018-09-04 DIAGNOSIS — L97923 Non-pressure chronic ulcer of unspecified part of left lower leg with necrosis of muscle: Secondary | ICD-10-CM | POA: Insufficient documentation

## 2018-09-04 DIAGNOSIS — M810 Age-related osteoporosis without current pathological fracture: Secondary | ICD-10-CM | POA: Diagnosis not present

## 2018-09-04 DIAGNOSIS — M79672 Pain in left foot: Secondary | ICD-10-CM | POA: Diagnosis not present

## 2018-09-09 DIAGNOSIS — M0579 Rheumatoid arthritis with rheumatoid factor of multiple sites without organ or systems involvement: Secondary | ICD-10-CM | POA: Diagnosis not present

## 2018-09-09 DIAGNOSIS — Z79899 Other long term (current) drug therapy: Secondary | ICD-10-CM | POA: Diagnosis not present

## 2018-09-25 ENCOUNTER — Telehealth: Payer: Self-pay | Admitting: Family Medicine

## 2018-09-25 NOTE — Telephone Encounter (Signed)
-----   Message from Birdie Sons, MD sent at 07/30/2018  5:32 PM EST ----- Regarding: check on vascular referral.

## 2018-09-25 NOTE — Telephone Encounter (Signed)
Patient was referred to vascular in January due to sore on leg that wouldn't heal, but she did not follow up through. Please see if the sore on leg is better. If not we can get another referral done.

## 2018-09-26 NOTE — Telephone Encounter (Signed)
Tried calling patient. Left message to call back. 

## 2018-09-30 NOTE — Telephone Encounter (Signed)
Spoke with pt.  She states she is not interested in going to vascular at this time.  She states her leg is doing a lot better.  I advised her to call if she changes her mind.   Thanks,   -Mickel Baas

## 2018-10-01 ENCOUNTER — Other Ambulatory Visit: Payer: Self-pay | Admitting: Family Medicine

## 2018-10-01 MED ORDER — COLLAGENASE 250 UNIT/GM EX OINT: 1.0000 "application " | TOPICAL_OINTMENT | Freq: Every day | CUTANEOUS | 3 refills | Status: DC

## 2018-10-01 NOTE — Telephone Encounter (Signed)
Pt called for a refill on   Collagenase ointment  [facility]   Thanks teri

## 2018-10-04 ENCOUNTER — Telehealth: Payer: Self-pay | Admitting: Family Medicine

## 2018-10-04 NOTE — Telephone Encounter (Signed)
Please call pt back to discuss her needing home health care asap.  Thanks, [employer]

## 2018-10-04 NOTE — Telephone Encounter (Signed)
Spoke to pt and she advised that her home health nurse will be calling to ask for orders to extend pt's home health.

## 2018-10-07 ENCOUNTER — Telehealth: Payer: Self-pay

## 2018-10-07 NOTE — Telephone Encounter (Signed)
Jesus Genera with [facility] would like a verbal order for skilled nursing for patient. Call back number is [phone]. 2 week 2 1 week 1 1 PRN

## 2018-10-07 NOTE — Telephone Encounter (Signed)
Tried calling the number listed below. No answer. The voice message system didn't identify as being the voice message system for Jesus Genera of [facility], so I left a general message for Silvana Newness to give me a call back for orders on a patient she requested along with our phone number.

## 2018-10-07 NOTE — Telephone Encounter (Signed)
That's fine

## 2018-10-08 NOTE — Telephone Encounter (Signed)
LMTCB 10/08/2018   Thanks,   -Mickel Baas

## 2018-10-14 ENCOUNTER — Ambulatory Visit: Payer: Self-pay | Admitting: Family Medicine

## 2018-10-16 ENCOUNTER — Telehealth: Payer: Self-pay

## 2018-10-16 ENCOUNTER — Telehealth: Payer: Self-pay | Admitting: *Deleted

## 2018-10-16 DIAGNOSIS — L97929 Non-pressure chronic ulcer of unspecified part of left lower leg with unspecified severity: Secondary | ICD-10-CM

## 2018-10-16 NOTE — Telephone Encounter (Signed)
Patient care manager Rip Harbour from [facility] called to update Dr. Caryn Section on patients case. She states that a home health nurse has been going out to asess patient's wound once a week and right now the wound is not changing in measure and is stable. Rip Harbour states that the new Medicare guidelines require that they move to discharging patient . She recommends that Dr. Caryn Section refer patient out to vascular for ABI assessment. Rip Harbour says that patient is not very happy that they are having to discharge her. Her last assessment is scheduled for the week of 10/23/2018, but they may be able to extend it. Rip Harbour' call back is [phone].

## 2018-10-16 NOTE — Telephone Encounter (Signed)
LMTCB 10/16/2018  Thanks,   -Mickel Baas

## 2018-10-16 NOTE — Telephone Encounter (Signed)
Pt states she would like to be referred to [facility].   Thanks,   -Mickel Baas

## 2018-10-16 NOTE — Telephone Encounter (Signed)
Order placed

## 2018-10-16 NOTE — Telephone Encounter (Signed)
Patient is wanting to discuss a referral to a vein clinic with one of Dr. Maralyn Sago CMA's. Please advise?

## 2018-10-16 NOTE — Telephone Encounter (Signed)
Patient notified that you will discuss all information at visit on 10/21/2018 @ 11AM

## 2018-10-16 NOTE — Telephone Encounter (Signed)
Will discuss at o.v. 10/21/2018

## 2018-10-18 DIAGNOSIS — M069 Rheumatoid arthritis, unspecified: Secondary | ICD-10-CM | POA: Diagnosis not present

## 2018-10-18 DIAGNOSIS — Z7952 Long term (current) use of systemic steroids: Secondary | ICD-10-CM

## 2018-10-18 DIAGNOSIS — I10 Essential (primary) hypertension: Secondary | ICD-10-CM | POA: Diagnosis not present

## 2018-10-18 DIAGNOSIS — Z9181 History of falling: Secondary | ICD-10-CM | POA: Diagnosis not present

## 2018-10-18 DIAGNOSIS — Z79899 Other long term (current) drug therapy: Secondary | ICD-10-CM

## 2018-10-18 DIAGNOSIS — S8992XD Unspecified injury of left lower leg, subsequent encounter: Secondary | ICD-10-CM | POA: Diagnosis not present

## 2018-10-18 DIAGNOSIS — L853 Xerosis cutis: Secondary | ICD-10-CM | POA: Diagnosis not present

## 2018-10-18 DIAGNOSIS — Z7982 Long term (current) use of aspirin: Secondary | ICD-10-CM

## 2018-10-21 ENCOUNTER — Ambulatory Visit (INDEPENDENT_AMBULATORY_CARE_PROVIDER_SITE_OTHER): Payer: Medicare HMO

## 2018-10-21 ENCOUNTER — Encounter: Payer: Self-pay | Admitting: Family Medicine

## 2018-10-21 ENCOUNTER — Ambulatory Visit (INDEPENDENT_AMBULATORY_CARE_PROVIDER_SITE_OTHER): Payer: Medicare HMO | Admitting: Family Medicine

## 2018-10-21 ENCOUNTER — Ambulatory Visit (INDEPENDENT_AMBULATORY_CARE_PROVIDER_SITE_OTHER): Payer: Medicare HMO | Admitting: Vascular Surgery

## 2018-10-21 ENCOUNTER — Encounter (INDEPENDENT_AMBULATORY_CARE_PROVIDER_SITE_OTHER): Payer: Self-pay | Admitting: Vascular Surgery

## 2018-10-21 ENCOUNTER — Other Ambulatory Visit (INDEPENDENT_AMBULATORY_CARE_PROVIDER_SITE_OTHER): Payer: Self-pay | Admitting: Vascular Surgery

## 2018-10-21 ENCOUNTER — Other Ambulatory Visit: Payer: Self-pay

## 2018-10-21 VITALS — BP 118/70 | HR 60 | Resp 16 | Ht 60.0 in | Wt 94.8 lb

## 2018-10-21 VITALS — BP 111/68 | HR 60 | Temp 97.5°F | Resp 16 | Wt 93.0 lb

## 2018-10-21 DIAGNOSIS — G8929 Other chronic pain: Secondary | ICD-10-CM

## 2018-10-21 DIAGNOSIS — M05749 Rheumatoid arthritis with rheumatoid factor of unspecified hand without organ or systems involvement: Secondary | ICD-10-CM

## 2018-10-21 DIAGNOSIS — I480 Paroxysmal atrial fibrillation: Secondary | ICD-10-CM

## 2018-10-21 DIAGNOSIS — I872 Venous insufficiency (chronic) (peripheral): Secondary | ICD-10-CM

## 2018-10-21 DIAGNOSIS — I10 Essential (primary) hypertension: Secondary | ICD-10-CM | POA: Diagnosis not present

## 2018-10-21 DIAGNOSIS — M545 Low back pain: Secondary | ICD-10-CM

## 2018-10-21 DIAGNOSIS — I83023 Varicose veins of left lower extremity with ulcer of ankle: Secondary | ICD-10-CM

## 2018-10-21 DIAGNOSIS — E7800 Pure hypercholesterolemia, unspecified: Secondary | ICD-10-CM

## 2018-10-21 DIAGNOSIS — I70248 Atherosclerosis of native arteries of left leg with ulceration of other part of lower left leg: Secondary | ICD-10-CM

## 2018-10-21 DIAGNOSIS — L97329 Non-pressure chronic ulcer of left ankle with unspecified severity: Secondary | ICD-10-CM | POA: Diagnosis not present

## 2018-10-21 DIAGNOSIS — R29898 Other symptoms and signs involving the musculoskeletal system: Secondary | ICD-10-CM

## 2018-10-21 DIAGNOSIS — Z7901 Long term (current) use of anticoagulants: Secondary | ICD-10-CM

## 2018-10-21 DIAGNOSIS — L97919 Non-pressure chronic ulcer of unspecified part of right lower leg with unspecified severity: Secondary | ICD-10-CM | POA: Diagnosis not present

## 2018-10-21 DIAGNOSIS — Z79899 Other long term (current) drug therapy: Secondary | ICD-10-CM | POA: Diagnosis not present

## 2018-10-21 DIAGNOSIS — I739 Peripheral vascular disease, unspecified: Secondary | ICD-10-CM | POA: Insufficient documentation

## 2018-10-21 DIAGNOSIS — G809 Cerebral palsy, unspecified: Secondary | ICD-10-CM

## 2018-10-21 DIAGNOSIS — L97929 Non-pressure chronic ulcer of unspecified part of left lower leg with unspecified severity: Secondary | ICD-10-CM

## 2018-10-21 DIAGNOSIS — I2510 Atherosclerotic heart disease of native coronary artery without angina pectoris: Secondary | ICD-10-CM | POA: Diagnosis not present

## 2018-10-21 MED ORDER — HYDROCODONE-ACETAMINOPHEN 7.5-325 MG PO TABS
2.0000 | ORAL_TABLET | Freq: Three times a day (TID) | ORAL | 0 refills | Status: DC | PRN
Start: 2018-10-21 — End: 2019-01-20

## 2018-10-21 NOTE — Progress Notes (Signed)
MRN : [id number]  Tina Salinas is a 73 y.o. ([birth year]) female who presents with chief complaint of No chief complaint on file. Marland Kitchen  History of Present Illness:   Patient is seen for evaluation of leg pain and swelling associated with chronic  Ulceration of the medial left ankle. The patient first noticed the swelling remotely. The the chronic swelling is associated with pain and discoloration. The pain and swelling worsens with prolonged dependency and improves with elevation. The pain is unrelated to activity.  The patient notes that in the morning the legs are better but the leg symptoms worsened throughout the course of the day. The patient has also noted a progressive worsening of the discoloration in the left ankle and shin area.   The patient notes that an ulcer has developed without specific trauma and since it occurred it has been very slow to heal.  There is a moderate amount of drainage associated with the open area.  The wound is also very painful. She has been using Santyl but not compression  The patient denies claudication symptoms or rest pain symptoms.  The patient denies DJD and LS spine disease.  The patient has not had any past angiography, interventions or vascular surgery.  Elevation makes the leg symptoms better, dependency makes them much worse. The patient denies any recent changes in medications.  The patient has not been wearing graduated compression.  The patient denies a history of DVT or PE. There is no prior history of phlebitis. There is no history of primary lymphedema.  No history of malignancies. No history of trauma or groin or pelvic surgery. There is no history of radiation treatment to the groin or pelvis       No outpatient medications have been marked as taking for the 10/21/18 encounter (Appointment) with Delana Meyer, Dolores Lory, MD.    Past Medical History:  Diagnosis Date  . CAP (community acquired pneumonia) 11/16/2017    Past Surgical  History:  Procedure Laterality Date  . BREAST ENHANCEMENT SURGERY Bilateral 1980  . CORONARY ANGIOPLASTY WITH STENT PLACEMENT  07/13/2008, 08/19/2008  . HIP ARTHROPLASTY N/A 2000  . LEFT HEART CATH AND CORONARY ANGIOGRAPHY N/A 05/16/2017   Procedure: LEFT HEART CATH AND CORONARY ANGIOGRAPHY;  Surgeon: Teodoro Spray, MD;  Location: [facility];  Service: Cardiovascular;  Laterality: N/A;  . TOTAL KNEE ARTHROPLASTY Left 09/2007, 01/2008    Social History Social History   Tobacco Use  . Smoking status: Never Smoker  . Smokeless tobacco: Never Used  Substance Use Topics  . Alcohol use: No  . Drug use: No    Family History Family History  Problem Relation Age of Onset  . Alcohol abuse Father   . Raynaud syndrome Sister   No family history of bleeding/clotting disorders, porphyria or autoimmune disease   Allergies  Allergen Reactions  . Etodolac     Other reaction(s): Unknown  . Sulfa Antibiotics   . Tetracycline      REVIEW OF SYSTEMS (Negative unless checked)  Constitutional: [] Weight loss  [] Fever  [] Chills Cardiac: [] Chest pain   [] Chest pressure   [] Palpitations   [] Shortness of breath when laying flat   [] Shortness of breath with exertion. Vascular:  [] Pain in legs with walking   [x] Pain in legs at rest  [] History of DVT   [] Phlebitis   [] Swelling in legs   [] Varicose veins   [] Non-healing ulcers Pulmonary:   [] Uses home oxygen   [] Productive cough   [] Hemoptysis   []   Wheeze  [] COPD   [] Asthma Neurologic:  [] Dizziness   [] Seizures   [] History of stroke   [] History of TIA  [] Aphasia   [] Vissual changes   [] Weakness or numbness in arm   [x] Weakness or numbness in leg Musculoskeletal:   [] Joint swelling   [x] Joint pain   [] Low back pain Hematologic:  [] Easy bruising  [] Easy bleeding   [] Hypercoagulable state   [] Anemic Gastrointestinal:  [] Diarrhea   [] Vomiting  [] Gastroesophageal reflux/heartburn   [] Difficulty swallowing. Genitourinary:  [x] Chronic kidney disease    [] Difficult urination  [] Frequent urination   [] Blood in urine Skin:  [] Rashes   [] Ulcers  Psychological:  [] History of anxiety   []  History of major depression.  Physical Examination  There were no vitals filed for this visit. There is no height or weight on file to calculate BMI. Gen: WD/WN, seen with a walker Head: [place]/AT, No temporalis wasting.  Ear/Nose/Throat: Hearing grossly intact, nares w/o erythema or drainage, poor dentition Eyes: PER, EOMI, sclera nonicteric.  Neck: Supple, no masses.  No bruit or JVD.  Pulmonary:  Good air movement, clear to auscultation bilaterally, no use of accessory muscles.  Cardiac: RRR, normal S1, S2, no Murmurs. Vascular: 2-3+ edema of the left leg with severe venous changes of the left leg.  Venous ulcer noted in the ankle area on the left, noninfected Vessel Right Left  PT Palpable Palpable  DP Palpable Palpable  Gastrointestinal: soft, non-distended. No guarding/no peritoneal signs.  Musculoskeletal: M/S 5/5 throughout.  No deformity or atrophy.  Neurologic: CN 2-12 intact. Pain and light touch intact in extremities.  Symmetrical.  Speech is fluent. Motor exam as listed above. Psychiatric: Judgment intact, Mood & affect appropriate for pt's clinical situation. Dermatologic: Venous stasis dermatitis with ulcer present on the left No changes consistent with cellulitis. Lymph : No Cervical lymphadenopathy, no lichenification or skin changes of chronic lymphedema.  CBC Lab Results  Component Value Date   WBC 6.1 03/01/2018   HGB 12.3 03/01/2018   HCT 36.6 03/01/2018   MCV 101 (H) 03/01/2018   PLT 221 03/01/2018    BMET    Component Value Date/Time   NA 143 07/15/2018 1532   K 3.5 07/15/2018 1532   CL 105 07/15/2018 1532   CO2 21 07/15/2018 1532   GLUCOSE 59 (L) 07/15/2018 1532   GLUCOSE 91 11/18/2017 0525   BUN 18 07/15/2018 1532   CREATININE 1.29 (H) 07/15/2018 1532   CALCIUM 8.9 07/15/2018 1532   GFRNONAA 42 (L) 07/15/2018 1532    GFRAA 48 (L) 07/15/2018 1532   CrCl cannot be calculated (Patient's most recent lab result is older than the maximum 21 days allowed.).  COAG No results found for: INR, PROTIME  Radiology No results found.   Assessment/Plan 1. Chronic venous insufficiency No surgery or intervention at this point in time.    ABI's are normal bilateral  I have had a long discussion with the patient regarding venous insufficiency and why it  causes symptoms. I have discussed with the patient the chronic skin changes that accompany venous insufficiency and the long term sequela such as infection and ulceration.  Patient will begin wearing graduated compression stockings on a daily basis once the ulcer is healed. She will begin with an Haematologist today.  The patient will put the stockings on first thing in the morning and removing them in the evening. The patient is instructed specifically not to sleep in the stockings.    In addition, behavioral modification including several periods  of elevation of the lower extremities during the day will be continued. I have demonstrated that proper elevation is a position with the ankles at heart level.  The patient will follow up in 1 month to reassess the degree of swelling and the control that graduated compression stockings or compression wraps  is offering.   The patient can be assessed for a Lymph Pump at that time  2. Venous ulcer of ankle, left (HCC) No surgery or intervention at this point in time.    I have had a long discussion with the patient regarding venous insufficiency and why it  causes symptoms, specifically venous ulceration . I have discussed with the patient the chronic skin changes that accompany venous insufficiency and the long term sequela such as infection and recurring  ulceration.  Patient will be placed in [employer] which will be changed weekly drainage permitting.  In addition, behavioral modification including several periods of elevation  of the lower extremities during the day will be continued. Achieving a position with the ankles at heart level was stressed to the patient  The patient is instructed to begin routine exercise, especially walking on a daily basis  3. Arteriosclerosis of coronary artery Continue cardiac and antihypertensive medications as already ordered and reviewed, no changes at this time.  Continue statin as ordered and reviewed, no changes at this time  Nitrates PRN for chest pain   4. Essential (primary) hypertension Continue antihypertensive medications as already ordered, these medications have been reviewed and there are no changes at this time.   5. Paroxysmal atrial fibrillation (HCC) Continue antiarrhythmia medications as already ordered, these medications have been reviewed and there are no changes at this time.  Continue anticoagulation as ordered by Cardiology Service   6. Hypercholesteremia Continue statin as ordered and reviewed, no changes at this time   Hortencia Pilar, MD  10/21/2018 1:46 PM

## 2018-10-21 NOTE — Progress Notes (Signed)
Patient: Tina Salinas Female    DOB: [birth year]   73 y.o.   MRN: [id number] Visit Date: 10/21/2018  Today's Provider: Lelon Huh, MD   Chief Complaint  Patient presents with  . Follow-up   Subjective:     HPI  Follow up for several medical concerns:  The patient was last seen in the office 3 months ago. Changes made at last visit include no changes.Patient would like to get a lifting recliner to help her stand up. She has been doing home PT due to LE weakness but is unable to get her self up from char due to weakness in her legs and arms.   Patient also wants to discuss getting another walker. States her current walker is too wide and she cannot get it thought doors at her home.   She is also here to follow up on chronic back and leg pain. Continues to do well with current dose of hydrocodone/apap. No adverse effects from medications.    ------------------------------------------------------------------------------------  Allergies  Allergen Reactions  . Etodolac     Other reaction(s): Unknown  . Sulfa Antibiotics   . Tetracycline      Current Outpatient Medications:  .  acyclovir (ZOVIRAX) 800 MG tablet, Take 1 tablet (800 mg total) by mouth 3 (three) times daily as needed., Disp: 270 tablet, Rfl: 2 .  alendronate (FOSAMAX) 70 MG tablet, Take 70 mg by mouth once a week. , Disp: , Rfl:  .  ALPRAZolam (XANAX) 0.25 MG tablet, Take 1 tablet (0.25 mg total) by mouth at bedtime as needed for sleep., Disp: 30 tablet, Rfl: 3 .  amiodarone (PACERONE) 200 MG tablet, Take 200 mg by mouth daily., Disp: , Rfl:  .  aspirin 81 MG tablet, Take 81 mg by mouth daily., Disp: , Rfl:  .  cholecalciferol (VITAMIN D) 1000 UNITS tablet, Take 1,000 Units by mouth daily. , Disp: , Rfl:  .  collagenase (SANTYL) ointment, Apply 1 application topically daily., Disp: 90 g, Rfl: 3 .  cyproheptadine (PERIACTIN) 4 MG tablet, Take 1 tablet (4 mg total) by mouth 3 (three) times daily. For  appetite, Disp: 60 tablet, Rfl: 5 .  fluticasone (FLONASE) 50 MCG/ACT nasal spray, Place 2 sprays into both nostrils daily., Disp: 16 g, Rfl: 6 .  furosemide (LASIX) 20 MG tablet, Take 1 tablet (20 mg total) by mouth daily., Disp: 30 tablet, Rfl: 1 .  HYDROcodone-acetaminophen (NORCO) 7.5-325 MG tablet, Take 2 tablets by mouth 3 (three) times daily as needed for moderate pain., Disp: 540 tablet, Rfl: 0 .  hydroxychloroquine (PLAQUENIL) 200 MG tablet, Take 400 mg by mouth daily. , Disp: , Rfl:  .  latanoprost (XALATAN) 0.005 % ophthalmic solution, Place 1 drop into both eyes at bedtime., Disp: , Rfl:  .  Methotrexate, PF, 25 MG/0.5ML SOAJ, Inject 1.4 mLs into the skin once a week., Disp: , Rfl:  .  Methylcellulose, Laxative, (GNP FIBER THERAPY PO), Take 1 Dose by mouth daily as needed (constipation). , Disp: , Rfl:  .  oxybutynin (DITROPAN) 5 MG tablet, TAKE 1 TABLET TWICE DAILY AS NEEDED  FOR  BLADDER  SPASMS, Disp: 180 tablet, Rfl: 4 .  pravastatin (PRAVACHOL) 40 MG tablet, Take 1 tablet (40 mg total) by mouth at bedtime., Disp: 30 tablet, Rfl: 0 .  predniSONE (DELTASONE) 2.5 MG tablet, Take 2.5 mg by mouth daily with breakfast., Disp: , Rfl:  .  carvedilol (COREG) 3.125 MG tablet, Take 1 tablet (3.125  mg total) by mouth 2 (two) times daily., Disp: 60 tablet, Rfl: 3  Review of Systems  Constitutional: Negative for appetite change, chills, fatigue and fever.  Respiratory: Negative for chest tightness and shortness of breath.   Cardiovascular: Negative for chest pain and palpitations.  Gastrointestinal: Negative for abdominal pain, nausea and vomiting.  Skin: Positive for wound.  Neurological: Negative for dizziness and weakness.    Social History   Tobacco Use  . Smoking status: Never Smoker  . Smokeless tobacco: Never Used  Substance Use Topics  . Alcohol use: No      Objective:   BP 111/68 (BP Location: Right Arm, Patient Position: Sitting, Cuff Size: Small)   Pulse 60   Temp (!)  97.5 F (36.4 C) (Oral)   Resp 16   Wt 93 lb (42.2 kg)   SpO2 99% Comment: room air  BMI 17.57 kg/m  Vitals:   10/21/18 1117  BP: 111/68  Pulse: 60  Resp: 16  Temp: (!) 97.5 F (36.4 C)  TempSrc: Oral  SpO2: 99%  Weight: 93 lb (42.2 kg)     Physical Exam   General Appearance:    Alert, cooperative, no distress  Eyes:    PERRL, conjunctiva/corneas clear, EOM's intact       Lungs:     Clear to auscultation bilaterally, respirations unlabored  Heart:    Regular rate and rhythm  Neurologic:   Awake, alert, oriented x 3. +3 LE and UE strength.           Assessment & Plan    1. Weakness of lower extremity, unspecified laterality She is going to check with [facility] to try to be fitted with narrower walker to help improve ambulation  2. Upper extremity weakness Unable to push herself up from regular chair to ambulated. Will look into ordering lift relciner chair.   3. 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   4. Cerebral palsy, unspecified type ([place]) Stable.   5. Chronic bilateral low back pain without sciatica Refill - HYDROcodone-acetaminophen (NORCO) 7.5-325 MG tablet; Take 2 tablets by mouth 3 (three) times daily as needed for moderate pain.  Dispense: 540 tablet; Refill: 0  6. Skin ulcer of left lower leg, unspecified ulcer stage ([place]) Non-healing for months, is to see vascular this afternoon for evaluation.       Lelon Huh, MD  [facility]

## 2018-10-21 NOTE — Patient Instructions (Addendum)
  Contact [facility] to help find a more suitable walker and lifting recliner chair [address] [place], [place] [address] Phone: [phone]   [facility] is on the other side of [address] Address: [address] [place], [place] [address] Phone: [phone]

## 2018-10-22 ENCOUNTER — Telehealth: Payer: Self-pay | Admitting: Family Medicine

## 2018-10-22 NOTE — Telephone Encounter (Signed)
That's fine

## 2018-10-22 NOTE — Telephone Encounter (Signed)
Melinda advised.

## 2018-10-22 NOTE — Telephone Encounter (Signed)
Melinda with [facility] needing ok to take orders from Dr. Delana Meyer at [facility].  Change her orders to weekly to 5/19  CB# [phone]  Thanks  Con Memos

## 2018-10-22 NOTE — Telephone Encounter (Signed)
Please advise 

## 2018-10-30 ENCOUNTER — Telehealth: Payer: Self-pay | Admitting: Family Medicine

## 2018-10-30 NOTE — Telephone Encounter (Signed)
Tina Salinas with [facility] called needs verbal for home health aid to assist with bathing.  Call back is [phone]  Thanks Con Memos

## 2018-10-30 NOTE — Telephone Encounter (Signed)
That's fine

## 2018-10-30 NOTE — Telephone Encounter (Signed)
Please advise 

## 2018-10-31 NOTE — Telephone Encounter (Signed)
Varney Biles advised of verbal orders. She also wanted to let Dr. Caryn Section know that she was working on getting patient a smaller walker, since she is having trouble using the one she has.

## 2018-11-04 ENCOUNTER — Other Ambulatory Visit: Payer: Self-pay | Admitting: Family Medicine

## 2018-11-04 NOTE — Telephone Encounter (Signed)
Pt called wanting to have Dr. Maralyn Sago nurse call her back,  Patient would not tell why she was calling.  She said it had to do with her medication and she did not want to tell everyone just his nurse...  [phone]  Thanks Con Memos

## 2018-11-05 ENCOUNTER — Telehealth: Payer: Self-pay

## 2018-11-05 ENCOUNTER — Other Ambulatory Visit: Payer: Self-pay | Admitting: Family Medicine

## 2018-11-05 DIAGNOSIS — G4700 Insomnia, unspecified: Secondary | ICD-10-CM

## 2018-11-05 MED ORDER — ACYCLOVIR 800 MG PO TABS: 800.0000 mg | ORAL_TABLET | Freq: Three times a day (TID) | ORAL | 2 refills | Status: DC | PRN

## 2018-11-05 MED ORDER — FLUCONAZOLE 150 MG PO TABS: 150.0000 mg | ORAL_TABLET | Freq: Once | ORAL | 0 refills | Status: DC

## 2018-11-05 NOTE — Telephone Encounter (Signed)
Patient wants a prescription for Diflucan to treat for yeast inflection. She says she has had some vaginal itching. Patient denies any vaginal discharge. Patient would also like a refill on Acyclovir. She wants the Acyclovir sent to [facility] mail order pharmacy and the Diflucan sent to [facility] pharmacy. Please advise.

## 2018-11-05 NOTE — Telephone Encounter (Signed)
Patient called that she needed the number again that she was given by Dr.Fsiher nurse. Do you know what number is it?

## 2018-11-06 ENCOUNTER — Telehealth: Payer: Self-pay

## 2018-11-06 NOTE — Telephone Encounter (Signed)
Left message on patient's voice message system with the requested telephone number.

## 2018-11-06 NOTE — Telephone Encounter (Signed)
Ms. Hargrove called to get some information about [facility]. She said she needs a phone number for someone named Varney Biles.

## 2018-11-08 DIAGNOSIS — Z85828 Personal history of other malignant neoplasm of skin: Secondary | ICD-10-CM | POA: Diagnosis not present

## 2018-11-08 DIAGNOSIS — L308 Other specified dermatitis: Secondary | ICD-10-CM | POA: Diagnosis not present

## 2018-11-08 DIAGNOSIS — Z08 Encounter for follow-up examination after completed treatment for malignant neoplasm: Secondary | ICD-10-CM | POA: Diagnosis not present

## 2018-11-08 DIAGNOSIS — L309 Dermatitis, unspecified: Secondary | ICD-10-CM | POA: Diagnosis not present

## 2018-11-12 DIAGNOSIS — I480 Paroxysmal atrial fibrillation: Secondary | ICD-10-CM | POA: Diagnosis not present

## 2018-11-12 DIAGNOSIS — N183 Chronic kidney disease, stage 3 (moderate): Secondary | ICD-10-CM | POA: Diagnosis not present

## 2018-11-12 DIAGNOSIS — I25118 Atherosclerotic heart disease of native coronary artery with other forms of angina pectoris: Secondary | ICD-10-CM | POA: Diagnosis not present

## 2018-11-12 DIAGNOSIS — I428 Other cardiomyopathies: Secondary | ICD-10-CM | POA: Diagnosis not present

## 2018-11-14 ENCOUNTER — Telehealth: Payer: Self-pay

## 2018-11-14 DIAGNOSIS — L309 Dermatitis, unspecified: Secondary | ICD-10-CM | POA: Diagnosis not present

## 2018-11-14 DIAGNOSIS — L988 Other specified disorders of the skin and subcutaneous tissue: Secondary | ICD-10-CM | POA: Diagnosis not present

## 2018-11-14 NOTE — Telephone Encounter (Signed)
Patient called and asked if she could come be weighted on our scale so she could reported to her cardiologist. I advance patient she could come get weighted and she should wear a mask. Patient states she has a mask to put on. FYI

## 2018-11-19 ENCOUNTER — Ambulatory Visit (INDEPENDENT_AMBULATORY_CARE_PROVIDER_SITE_OTHER): Payer: Medicare HMO | Admitting: Nurse Practitioner

## 2018-11-19 ENCOUNTER — Telehealth: Payer: Self-pay

## 2018-11-19 NOTE — Telephone Encounter (Signed)
Patient called requesting a prescription for a walker, and a note stating why she needs it. The medical supply store that she plans to get the walker from are requesting it. She says they were also wanting office notes from Dr. Caryn Section that include the need for a walker. Patient does not want her office notes released to the medical supply store. She feels that this is personal information and they don't need access to. Please call patient back.

## 2018-11-20 ENCOUNTER — Ambulatory Visit (INDEPENDENT_AMBULATORY_CARE_PROVIDER_SITE_OTHER): Payer: Medicare HMO | Admitting: Nurse Practitioner

## 2018-11-20 DIAGNOSIS — T7840XA Allergy, unspecified, initial encounter: Secondary | ICD-10-CM | POA: Diagnosis not present

## 2018-11-20 NOTE — Telephone Encounter (Signed)
Have written order for walker.

## 2018-11-20 NOTE — Telephone Encounter (Signed)
I advised patient that the order was ready for pick up. Patient says she didn't need an order for a walker, she needs an order for a Lift Recliner. She states she has trouble getting up, and the Lift recliner would help when she tries to stand. Patient is aware that Dr. Caryn Section is out of the office until Friday 11/22/2018. She states she is ok with waiting until he returns for this order to be written.

## 2018-11-21 ENCOUNTER — Ambulatory Visit (INDEPENDENT_AMBULATORY_CARE_PROVIDER_SITE_OTHER): Payer: Medicare HMO | Admitting: Nurse Practitioner

## 2018-11-22 ENCOUNTER — Ambulatory Visit (INDEPENDENT_AMBULATORY_CARE_PROVIDER_SITE_OTHER): Payer: Medicare HMO | Admitting: Nurse Practitioner

## 2018-11-28 ENCOUNTER — Ambulatory Visit (INDEPENDENT_AMBULATORY_CARE_PROVIDER_SITE_OTHER): Payer: Medicare HMO | Admitting: Nurse Practitioner

## 2018-11-29 NOTE — Telephone Encounter (Signed)
Let patient new order has been written

## 2018-12-02 ENCOUNTER — Other Ambulatory Visit: Payer: Self-pay

## 2018-12-02 ENCOUNTER — Encounter (INDEPENDENT_AMBULATORY_CARE_PROVIDER_SITE_OTHER): Payer: Self-pay | Admitting: Nurse Practitioner

## 2018-12-02 ENCOUNTER — Ambulatory Visit (INDEPENDENT_AMBULATORY_CARE_PROVIDER_SITE_OTHER): Payer: Medicare HMO | Admitting: Nurse Practitioner

## 2018-12-02 VITALS — BP 125/71 | HR 69 | Resp 12 | Ht 60.0 in | Wt 95.0 lb

## 2018-12-02 DIAGNOSIS — I83023 Varicose veins of left lower extremity with ulcer of ankle: Secondary | ICD-10-CM

## 2018-12-02 DIAGNOSIS — Z79899 Other long term (current) drug therapy: Secondary | ICD-10-CM | POA: Diagnosis not present

## 2018-12-02 DIAGNOSIS — M158 Other polyosteoarthritis: Secondary | ICD-10-CM

## 2018-12-02 DIAGNOSIS — Z791 Long term (current) use of non-steroidal anti-inflammatories (NSAID): Secondary | ICD-10-CM | POA: Diagnosis not present

## 2018-12-02 DIAGNOSIS — L97329 Non-pressure chronic ulcer of left ankle with unspecified severity: Secondary | ICD-10-CM

## 2018-12-02 DIAGNOSIS — I10 Essential (primary) hypertension: Secondary | ICD-10-CM | POA: Diagnosis not present

## 2018-12-02 DIAGNOSIS — H40153 Residual stage of open-angle glaucoma, bilateral: Secondary | ICD-10-CM | POA: Diagnosis not present

## 2018-12-02 NOTE — Progress Notes (Signed)
SUBJECTIVE:  Patient ID: Tina Salinas, female    DOB: Sep 21, 1945, 73 y.o.   MRN: [id number] Chief Complaint  Patient presents with  . Follow-up    HPI  Tina Salinas is a 73 y.o. female Patient is seen for follow up evaluation of leg pain and swelling associated with venous ulceration. The patient was recently seen here and started on Unna boot therapy.  The swelling abruptly became much worse bilaterally and is associated with pain and discoloration. The pain and swelling worsens with prolonged dependency and improves with elevation.  The patient notes that in the morning the legs are better but the leg symptoms worsened throughout the course of the day. The patient has also noted a progressive worsening of the discoloration in the ankle and shin area.   The patient notes that an ulcer has developed acutely without specific trauma and since it occurred it has been very slow to heal.  There is a moderate amount of drainage associated with the open area.  The wound is also very painful.  The patient states that they have been elevating as much as possible. The patient denies any recent changes in medications.  The patient denies a history of DVT or PE. There is no prior history of phlebitis. There is no history of primary lymphedema.  No SOB or increased cough.  No sputum production.  No recent episodes of CHF exacerbation.   Past Medical History:  Diagnosis Date  . CAP (community acquired pneumonia) 11/16/2017    Past Surgical History:  Procedure Laterality Date  . BREAST ENHANCEMENT SURGERY Bilateral 1980  . CORONARY ANGIOPLASTY WITH STENT PLACEMENT  07/13/2008, 08/19/2008  . HIP ARTHROPLASTY N/A 2000  . LEFT HEART CATH AND CORONARY ANGIOGRAPHY N/A 05/16/2017   Procedure: LEFT HEART CATH AND CORONARY ANGIOGRAPHY;  Surgeon: Teodoro Spray, MD;  Location: [facility];  Service: Cardiovascular;  Laterality: N/A;  . TOTAL KNEE ARTHROPLASTY Left 09/2007, 01/2008    Social  History   Socioeconomic History  . Marital status: Widowed    Spouse name: Not on file  . Number of children: Not on file  . Years of education: Not on file  . Highest education level: Not on file  Occupational History  . Not on file  Social Needs  . Financial resource strain: Not on file  . Food insecurity:    Worry: Not on file    Inability: Not on file  . Transportation needs:    Medical: Not on file    Non-medical: Not on file  Tobacco Use  . Smoking status: Never Smoker  . Smokeless tobacco: Never Used  Substance and Sexual Activity  . Alcohol use: No  . Drug use: No  . Sexual activity: Not on file  Lifestyle  . Physical activity:    Days per week: Not on file    Minutes per session: Not on file  . Stress: Not on file  Relationships  . Social connections:    Talks on phone: Not on file    Gets together: Not on file    Attends religious service: Not on file    Active member of club or organization: Not on file    Attends meetings of clubs or organizations: Not on file    Relationship status: Not on file  . Intimate partner violence:    Fear of current or ex partner: Not on file    Emotionally abused: Not on file    Physically abused:  Not on file    Forced sexual activity: Not on file  Other Topics Concern  . Not on file  Social History Narrative  . Not on file    Family History  Problem Relation Age of Onset  . Alcohol abuse Father   . Raynaud syndrome Sister     Allergies  Allergen Reactions  . Etodolac     Other reaction(s): Unknown  . Sulfa Antibiotics   . Tetracycline      Review of Systems   Review of Systems: Negative Unless Checked Constitutional: [] Weight loss  [] Fever  [] Chills Cardiac: [] Chest pain   [x]  Atrial Fibrillation  [] Palpitations   [] Shortness of breath when laying flat   [] Shortness of breath with exertion. [] Shortness of breath at rest Vascular:  [] Pain in legs with walking   [] Pain in legs with standing [] Pain in legs  when laying flat   [] Claudication    [] Pain in feet when laying flat    [] History of DVT   [] Phlebitis   [] Swelling in legs   [] Varicose veins   [] Non-healing ulcers Pulmonary:   [] Uses home oxygen   [] Productive cough   [] Hemoptysis   [] Wheeze  [] COPD   [] Asthma Neurologic:  [] Dizziness   [] Seizures  [] Blackouts [] History of stroke   [] History of TIA  [] Aphasia   [] Temporary Blindness   [] Weakness or numbness in arm   [] Weakness or numbness in leg Musculoskeletal:   [] Joint swelling   [] Joint pain   [] Low back pain  []  History of Knee Replacement [x] Arthritis [] back Surgeries  []  Spinal Stenosis    Hematologic:  [] Easy bruising  [] Easy bleeding   [] Hypercoagulable state   [] Anemic Gastrointestinal:  [] Diarrhea   [] Vomiting  [] Gastroesophageal reflux/heartburn   [] Difficulty swallowing. [] Abdominal pain Genitourinary:  [x] Chronic kidney disease   [] Difficult urination  [] Anuric   [] Blood in urine [] Frequent urination  [] Burning with urination   [] Hematuria Skin:  [] Rashes   [] Ulcers [x] Wounds Psychological:  [] History of anxiety   []  History of major depression  []  Memory Difficulties      OBJECTIVE:   Physical Exam  BP 125/71 (BP Location: Left Arm, Patient Position: Sitting, Cuff Size: Small)   Pulse 69   Resp 12   Ht 5' (1.524 m)   Wt 95 lb (43.1 kg)   BMI 18.55 kg/m   Gen: WD/WN, NAD Head: [place]/AT, No temporalis wasting.  Ear/Nose/Throat: Hearing grossly intact, nares w/o erythema or drainage Eyes: PER, EOMI, sclera nonicteric.  Neck: Supple, no masses.  No JVD.  Pulmonary:  Good air movement, no use of accessory muscles.  Cardiac: RRR Vascular:  Vessel Right Left  Radial Palpable Palpable  Dorsalis Pedis Palpable Palpable  Posterior Tibial Palpable Palpable   Gastrointestinal: soft, non-distended. No guarding/no peritoneal signs.  Musculoskeletal: Uses walker for ambulation  No deformity or atrophy.  Neurologic: Pain and light touch intact in extremities.  Symmetrical.   Speech is fluent. Motor exam as listed above. Psychiatric: Judgment intact, Mood & affect appropriate for pt's clinical situation. Dermatologic: stasis dermatitis with scabbed ulcer on LLE.  No changes consistent with cellulitis. Lymph : No Cervical lymphadenopathy, no lichenification or skin changes of chronic lymphedema.       ASSESSMENT AND PLAN:  1. Venous ulcer of ankle, left (HCC) No surgery or intervention at this point in time.    I have had a long discussion with the patient regarding venous insufficiency and why it  causes symptoms, specifically venous ulceration . I have discussed with the  patient the chronic skin changes that accompany venous insufficiency and the long term sequela such as infection and recurring  ulceration.  Patient will be placed in [employer] which will be changed weekly drainage permitting.  In addition, behavioral modification including several periods of elevation of the lower extremities during the day will be continued. Achieving a position with the ankles at heart level was stressed to the patient  The patient is instructed to begin routine exercise, especially walking on a daily basis  Patient should undergo duplex ultrasound of the venous system to ensure that DVT or reflux is not present.   Following the review of the ultrasound the patient will follow up in four weeks  to reassess the degree of swelling and the control that Unna therapy is offering.   The patient can be assessed for graduated compression stockings or wraps as well as a Lymph Pump once the ulcers are healed.   2. Essential (primary) hypertension Continue antihypertensive medications as already ordered, these medications have been reviewed and there are no changes at this time.   3. Other osteoarthritis involving multiple joints Continue NSAID medications as already ordered, these medications have been reviewed and there are no changes at this time.  Continued activity and  therapy was stressed.    Current Outpatient Medications on File Prior to Visit  Medication Sig Dispense Refill  . acyclovir (ZOVIRAX) 800 MG tablet Take 1 tablet (800 mg total) by mouth 3 (three) times daily as needed. 270 tablet 2  . alendronate (FOSAMAX) 70 MG tablet Take 70 mg by mouth once a week.     . ALPRAZolam (XANAX) 0.25 MG tablet TAKE ONE TABLET BY MOUTH AT BEDTIME AS NEEDED FOR SLEEP 90 tablet 1  . amiodarone (PACERONE) 200 MG tablet Take 200 mg by mouth daily.    Marland Kitchen aspirin 81 MG tablet Take 81 mg by mouth daily.    . cholecalciferol (VITAMIN D) 1000 UNITS tablet Take 1,000 Units by mouth daily.     . collagenase (SANTYL) ointment Apply 1 application topically daily. 90 g 3  . cyproheptadine (PERIACTIN) 4 MG tablet Take 1 tablet (4 mg total) by mouth 3 (three) times daily. For appetite 60 tablet 5  . fluticasone (FLONASE) 50 MCG/ACT nasal spray Place 2 sprays into both nostrils daily. 16 g 6  . furosemide (LASIX) 20 MG tablet Take 1 tablet (20 mg total) by mouth daily. 30 tablet 1  . HYDROcodone-acetaminophen (NORCO) 7.5-325 MG tablet Take 2 tablets by mouth 3 (three) times daily as needed for moderate pain. 540 tablet 0  . hydroxychloroquine (PLAQUENIL) 200 MG tablet Take 400 mg by mouth daily.     Marland Kitchen latanoprost (XALATAN) 0.005 % ophthalmic solution Place 1 drop into both eyes at bedtime.    . Methotrexate, PF, 25 MG/0.5ML SOAJ Inject 1.4 mLs into the skin once a week.    . Methylcellulose, Laxative, (GNP FIBER THERAPY PO) Take 1 Dose by mouth daily as needed (constipation).     Marland Kitchen oxybutynin (DITROPAN) 5 MG tablet TAKE 1 TABLET TWICE DAILY AS NEEDED  FOR  BLADDER  SPASMS 180 tablet 4  . pravastatin (PRAVACHOL) 40 MG tablet Take 1 tablet (40 mg total) by mouth at bedtime. 30 tablet 0  . predniSONE (DELTASONE) 2.5 MG tablet Take 2.5 mg by mouth daily with breakfast.    . carvedilol (COREG) 3.125 MG tablet Take 1 tablet (3.125 mg total) by mouth 2 (two) times daily. 60 tablet 3    No  current facility-administered medications on file prior to visit.     There are no Patient Instructions on file for this visit. Return in about 4 weeks (around 12/30/2018) for Unna Check.   Kris Hartmann, NP  This note was completed with Sales executive.  Any errors are purely unintentional.

## 2018-12-02 NOTE — Telephone Encounter (Signed)
Pt advised.   Thanks,   -[place]  

## 2018-12-03 ENCOUNTER — Telehealth (INDEPENDENT_AMBULATORY_CARE_PROVIDER_SITE_OTHER): Payer: Self-pay | Admitting: Nurse Practitioner

## 2018-12-03 ENCOUNTER — Telehealth: Payer: Self-pay

## 2018-12-03 NOTE — Telephone Encounter (Signed)
Patient needs you call her back regarding a medication change.

## 2018-12-03 NOTE — Telephone Encounter (Signed)
Tina Salinas with [facility] calling requesting verbal order for [employer]. Please advise. AS, CMA

## 2018-12-03 NOTE — Telephone Encounter (Signed)
ok 

## 2018-12-03 NOTE — Telephone Encounter (Signed)
[name] @ [facility]. Needs order for twice a week for bath and once a week for a nurse. [phone]

## 2018-12-04 NOTE — Telephone Encounter (Signed)
Verbal orders given to [facility] as below.

## 2018-12-04 NOTE — Telephone Encounter (Signed)
Tried calling patient. Left message to call back. 

## 2018-12-04 NOTE — Telephone Encounter (Signed)
That is fine 

## 2018-12-05 NOTE — Telephone Encounter (Signed)
Tina Salinas is aware of the below and verbalized understanding. AS, CMA

## 2018-12-10 ENCOUNTER — Encounter (INDEPENDENT_AMBULATORY_CARE_PROVIDER_SITE_OTHER): Payer: Medicare HMO

## 2018-12-10 ENCOUNTER — Telehealth: Payer: Self-pay

## 2018-12-10 DIAGNOSIS — Z7982 Long term (current) use of aspirin: Secondary | ICD-10-CM | POA: Diagnosis not present

## 2018-12-10 DIAGNOSIS — Z7952 Long term (current) use of systemic steroids: Secondary | ICD-10-CM | POA: Diagnosis not present

## 2018-12-10 DIAGNOSIS — L97321 Non-pressure chronic ulcer of left ankle limited to breakdown of skin: Secondary | ICD-10-CM | POA: Diagnosis not present

## 2018-12-10 DIAGNOSIS — I2510 Atherosclerotic heart disease of native coronary artery without angina pectoris: Secondary | ICD-10-CM | POA: Diagnosis not present

## 2018-12-10 DIAGNOSIS — Z4800 Encounter for change or removal of nonsurgical wound dressing: Secondary | ICD-10-CM | POA: Diagnosis not present

## 2018-12-10 DIAGNOSIS — I10 Essential (primary) hypertension: Secondary | ICD-10-CM | POA: Diagnosis not present

## 2018-12-10 DIAGNOSIS — I872 Venous insufficiency (chronic) (peripheral): Secondary | ICD-10-CM | POA: Diagnosis not present

## 2018-12-10 DIAGNOSIS — Z955 Presence of coronary angioplasty implant and graft: Secondary | ICD-10-CM | POA: Diagnosis not present

## 2018-12-10 DIAGNOSIS — I480 Paroxysmal atrial fibrillation: Secondary | ICD-10-CM | POA: Diagnosis not present

## 2018-12-10 NOTE — Telephone Encounter (Signed)
Ok to order home PT for gait and LE strength training if that's what she wants

## 2018-12-10 NOTE — Telephone Encounter (Signed)
[name] from [employer] would like a verbal okay for Physical Therapy to evaluate and treat.   Thanks,   -Mickel Baas

## 2018-12-10 NOTE — Telephone Encounter (Signed)
That's fine

## 2018-12-10 NOTE — Telephone Encounter (Signed)
Pt would like for Dr. Maralyn Sago Medical Assistant to call her about a referral for Physical Therapy.  She would not go into detail with me.  Only stated she want to start Physical Therapy.    Thanks,   -Mickel Baas

## 2018-12-12 DIAGNOSIS — I2510 Atherosclerotic heart disease of native coronary artery without angina pectoris: Secondary | ICD-10-CM | POA: Diagnosis not present

## 2018-12-12 DIAGNOSIS — I10 Essential (primary) hypertension: Secondary | ICD-10-CM | POA: Diagnosis not present

## 2018-12-12 DIAGNOSIS — I872 Venous insufficiency (chronic) (peripheral): Secondary | ICD-10-CM | POA: Diagnosis not present

## 2018-12-12 DIAGNOSIS — Z4800 Encounter for change or removal of nonsurgical wound dressing: Secondary | ICD-10-CM | POA: Diagnosis not present

## 2018-12-12 DIAGNOSIS — Z7952 Long term (current) use of systemic steroids: Secondary | ICD-10-CM | POA: Diagnosis not present

## 2018-12-12 DIAGNOSIS — Z955 Presence of coronary angioplasty implant and graft: Secondary | ICD-10-CM | POA: Diagnosis not present

## 2018-12-12 DIAGNOSIS — L97321 Non-pressure chronic ulcer of left ankle limited to breakdown of skin: Secondary | ICD-10-CM | POA: Diagnosis not present

## 2018-12-12 DIAGNOSIS — I480 Paroxysmal atrial fibrillation: Secondary | ICD-10-CM | POA: Diagnosis not present

## 2018-12-12 DIAGNOSIS — Z7982 Long term (current) use of aspirin: Secondary | ICD-10-CM | POA: Diagnosis not present

## 2018-12-16 ENCOUNTER — Encounter (INDEPENDENT_AMBULATORY_CARE_PROVIDER_SITE_OTHER): Payer: Medicare HMO

## 2018-12-17 ENCOUNTER — Telehealth: Payer: Self-pay | Admitting: Family Medicine

## 2018-12-17 DIAGNOSIS — I10 Essential (primary) hypertension: Secondary | ICD-10-CM | POA: Diagnosis not present

## 2018-12-17 DIAGNOSIS — I5043 Acute on chronic combined systolic (congestive) and diastolic (congestive) heart failure: Secondary | ICD-10-CM

## 2018-12-17 DIAGNOSIS — I2510 Atherosclerotic heart disease of native coronary artery without angina pectoris: Secondary | ICD-10-CM | POA: Diagnosis not present

## 2018-12-17 DIAGNOSIS — I480 Paroxysmal atrial fibrillation: Secondary | ICD-10-CM | POA: Diagnosis not present

## 2018-12-17 DIAGNOSIS — Z955 Presence of coronary angioplasty implant and graft: Secondary | ICD-10-CM | POA: Diagnosis not present

## 2018-12-17 DIAGNOSIS — Z7982 Long term (current) use of aspirin: Secondary | ICD-10-CM | POA: Diagnosis not present

## 2018-12-17 DIAGNOSIS — G809 Cerebral palsy, unspecified: Secondary | ICD-10-CM

## 2018-12-17 DIAGNOSIS — M05749 Rheumatoid arthritis with rheumatoid factor of unspecified hand without organ or systems involvement: Secondary | ICD-10-CM

## 2018-12-17 DIAGNOSIS — I872 Venous insufficiency (chronic) (peripheral): Secondary | ICD-10-CM | POA: Diagnosis not present

## 2018-12-17 DIAGNOSIS — Z4800 Encounter for change or removal of nonsurgical wound dressing: Secondary | ICD-10-CM | POA: Diagnosis not present

## 2018-12-17 DIAGNOSIS — L97321 Non-pressure chronic ulcer of left ankle limited to breakdown of skin: Secondary | ICD-10-CM | POA: Diagnosis not present

## 2018-12-17 DIAGNOSIS — Z7952 Long term (current) use of systemic steroids: Secondary | ICD-10-CM | POA: Diagnosis not present

## 2018-12-17 NOTE — Telephone Encounter (Signed)
Pt called saying she would like to talk to Dr Caryn Section about PT.  For strengthening  her legs  CB# [phone]

## 2018-12-18 DIAGNOSIS — G809 Cerebral palsy, unspecified: Secondary | ICD-10-CM | POA: Diagnosis not present

## 2018-12-18 NOTE — Telephone Encounter (Signed)
Left message to get more information.   Thanks,   -Mickel Baas

## 2018-12-18 NOTE — Telephone Encounter (Signed)
Patient called at 4:52 pm.  States she needs to get an order for PT.

## 2018-12-19 DIAGNOSIS — I2510 Atherosclerotic heart disease of native coronary artery without angina pectoris: Secondary | ICD-10-CM | POA: Diagnosis not present

## 2018-12-19 DIAGNOSIS — I480 Paroxysmal atrial fibrillation: Secondary | ICD-10-CM | POA: Diagnosis not present

## 2018-12-19 DIAGNOSIS — Z7982 Long term (current) use of aspirin: Secondary | ICD-10-CM | POA: Diagnosis not present

## 2018-12-19 DIAGNOSIS — L97321 Non-pressure chronic ulcer of left ankle limited to breakdown of skin: Secondary | ICD-10-CM | POA: Diagnosis not present

## 2018-12-19 DIAGNOSIS — Z955 Presence of coronary angioplasty implant and graft: Secondary | ICD-10-CM | POA: Diagnosis not present

## 2018-12-19 DIAGNOSIS — Z4800 Encounter for change or removal of nonsurgical wound dressing: Secondary | ICD-10-CM | POA: Diagnosis not present

## 2018-12-19 DIAGNOSIS — I10 Essential (primary) hypertension: Secondary | ICD-10-CM | POA: Diagnosis not present

## 2018-12-19 DIAGNOSIS — Z7952 Long term (current) use of systemic steroids: Secondary | ICD-10-CM | POA: Diagnosis not present

## 2018-12-19 DIAGNOSIS — I872 Venous insufficiency (chronic) (peripheral): Secondary | ICD-10-CM | POA: Diagnosis not present

## 2018-12-19 NOTE — Telephone Encounter (Signed)
Pt states she needs a referral for PT sent to [facility].    Please advise.  Thanks,   -Mickel Baas

## 2018-12-19 NOTE — Telephone Encounter (Signed)
Pt called back wanting to know if she can get in home PT.  Con Memos

## 2018-12-20 ENCOUNTER — Other Ambulatory Visit: Payer: Self-pay

## 2018-12-20 MED ORDER — OXYBUTYNIN CHLORIDE 5 MG PO TABS: ORAL_TABLET | ORAL | 4 refills | Status: DC

## 2018-12-20 NOTE — Telephone Encounter (Signed)
Patient is requesting a new prescription for Oxybutin 10 mg per [facility]. Patient called the pharmacy stating she needs this medication filled.

## 2018-12-22 DIAGNOSIS — I872 Venous insufficiency (chronic) (peripheral): Secondary | ICD-10-CM | POA: Diagnosis not present

## 2018-12-22 DIAGNOSIS — G809 Cerebral palsy, unspecified: Secondary | ICD-10-CM | POA: Diagnosis not present

## 2018-12-22 DIAGNOSIS — I5042 Chronic combined systolic (congestive) and diastolic (congestive) heart failure: Secondary | ICD-10-CM | POA: Diagnosis not present

## 2018-12-22 DIAGNOSIS — N189 Chronic kidney disease, unspecified: Secondary | ICD-10-CM | POA: Diagnosis not present

## 2018-12-22 DIAGNOSIS — I2510 Atherosclerotic heart disease of native coronary artery without angina pectoris: Secondary | ICD-10-CM | POA: Diagnosis not present

## 2018-12-22 DIAGNOSIS — L97221 Non-pressure chronic ulcer of left calf limited to breakdown of skin: Secondary | ICD-10-CM | POA: Diagnosis not present

## 2018-12-22 DIAGNOSIS — M159 Polyosteoarthritis, unspecified: Secondary | ICD-10-CM | POA: Diagnosis not present

## 2018-12-22 DIAGNOSIS — M810 Age-related osteoporosis without current pathological fracture: Secondary | ICD-10-CM | POA: Diagnosis not present

## 2018-12-22 DIAGNOSIS — I130 Hypertensive heart and chronic kidney disease with heart failure and stage 1 through stage 4 chronic kidney disease, or unspecified chronic kidney disease: Secondary | ICD-10-CM | POA: Diagnosis not present

## 2018-12-23 ENCOUNTER — Ambulatory Visit (INDEPENDENT_AMBULATORY_CARE_PROVIDER_SITE_OTHER): Payer: Medicare HMO | Admitting: Vascular Surgery

## 2018-12-23 ENCOUNTER — Telehealth (INDEPENDENT_AMBULATORY_CARE_PROVIDER_SITE_OTHER): Payer: Self-pay | Admitting: Vascular Surgery

## 2018-12-23 NOTE — Telephone Encounter (Signed)
Tina Salinas with [facility] called asking if the wound on patients leg was caused by trauma. The billing dept there was asking her. I advised her per last office note that the ulcer was not caused by any trauma. AS, CMA

## 2018-12-25 DIAGNOSIS — I130 Hypertensive heart and chronic kidney disease with heart failure and stage 1 through stage 4 chronic kidney disease, or unspecified chronic kidney disease: Secondary | ICD-10-CM | POA: Diagnosis not present

## 2018-12-25 DIAGNOSIS — Z7982 Long term (current) use of aspirin: Secondary | ICD-10-CM | POA: Diagnosis not present

## 2018-12-25 DIAGNOSIS — I2510 Atherosclerotic heart disease of native coronary artery without angina pectoris: Secondary | ICD-10-CM | POA: Diagnosis not present

## 2018-12-25 DIAGNOSIS — I10 Essential (primary) hypertension: Secondary | ICD-10-CM | POA: Diagnosis not present

## 2018-12-25 DIAGNOSIS — M159 Polyosteoarthritis, unspecified: Secondary | ICD-10-CM | POA: Diagnosis not present

## 2018-12-25 DIAGNOSIS — N189 Chronic kidney disease, unspecified: Secondary | ICD-10-CM | POA: Diagnosis not present

## 2018-12-25 DIAGNOSIS — Z955 Presence of coronary angioplasty implant and graft: Secondary | ICD-10-CM | POA: Diagnosis not present

## 2018-12-25 DIAGNOSIS — G809 Cerebral palsy, unspecified: Secondary | ICD-10-CM | POA: Diagnosis not present

## 2018-12-25 DIAGNOSIS — I5042 Chronic combined systolic (congestive) and diastolic (congestive) heart failure: Secondary | ICD-10-CM | POA: Diagnosis not present

## 2018-12-25 DIAGNOSIS — L97221 Non-pressure chronic ulcer of left calf limited to breakdown of skin: Secondary | ICD-10-CM | POA: Diagnosis not present

## 2018-12-25 DIAGNOSIS — I480 Paroxysmal atrial fibrillation: Secondary | ICD-10-CM | POA: Diagnosis not present

## 2018-12-25 DIAGNOSIS — Z4800 Encounter for change or removal of nonsurgical wound dressing: Secondary | ICD-10-CM | POA: Diagnosis not present

## 2018-12-25 DIAGNOSIS — M810 Age-related osteoporosis without current pathological fracture: Secondary | ICD-10-CM | POA: Diagnosis not present

## 2018-12-25 DIAGNOSIS — Z7952 Long term (current) use of systemic steroids: Secondary | ICD-10-CM | POA: Diagnosis not present

## 2018-12-25 DIAGNOSIS — L97321 Non-pressure chronic ulcer of left ankle limited to breakdown of skin: Secondary | ICD-10-CM | POA: Diagnosis not present

## 2018-12-25 DIAGNOSIS — I872 Venous insufficiency (chronic) (peripheral): Secondary | ICD-10-CM | POA: Diagnosis not present

## 2018-12-26 DIAGNOSIS — I2510 Atherosclerotic heart disease of native coronary artery without angina pectoris: Secondary | ICD-10-CM | POA: Diagnosis not present

## 2018-12-26 DIAGNOSIS — I10 Essential (primary) hypertension: Secondary | ICD-10-CM | POA: Diagnosis not present

## 2018-12-26 DIAGNOSIS — Z4800 Encounter for change or removal of nonsurgical wound dressing: Secondary | ICD-10-CM | POA: Diagnosis not present

## 2018-12-26 DIAGNOSIS — Z7982 Long term (current) use of aspirin: Secondary | ICD-10-CM | POA: Diagnosis not present

## 2018-12-26 DIAGNOSIS — Z7952 Long term (current) use of systemic steroids: Secondary | ICD-10-CM | POA: Diagnosis not present

## 2018-12-26 DIAGNOSIS — I480 Paroxysmal atrial fibrillation: Secondary | ICD-10-CM | POA: Diagnosis not present

## 2018-12-26 DIAGNOSIS — Z955 Presence of coronary angioplasty implant and graft: Secondary | ICD-10-CM | POA: Diagnosis not present

## 2018-12-26 DIAGNOSIS — L97321 Non-pressure chronic ulcer of left ankle limited to breakdown of skin: Secondary | ICD-10-CM | POA: Diagnosis not present

## 2018-12-26 DIAGNOSIS — I872 Venous insufficiency (chronic) (peripheral): Secondary | ICD-10-CM | POA: Diagnosis not present

## 2018-12-27 ENCOUNTER — Telehealth: Payer: Self-pay

## 2018-12-27 ENCOUNTER — Telehealth: Payer: Self-pay | Admitting: *Deleted

## 2018-12-27 DIAGNOSIS — G809 Cerebral palsy, unspecified: Secondary | ICD-10-CM

## 2018-12-27 NOTE — Telephone Encounter (Signed)
Please call [place] and give verbal order occupational therapy.

## 2018-12-27 NOTE — Telephone Encounter (Signed)
Patient called requesting to speak with a nurse. She says that she needs an order for a referral to Occupational therapy. She states she needed both PT and OT, but the only order that was placed was PT. Patient is requesting that we place a referral for Occupational therapy. Please advise.

## 2018-12-27 NOTE — Telephone Encounter (Signed)
Jason from [facility] advised.   Thanks,   -Mickel Baas

## 2018-12-27 NOTE — Telephone Encounter (Signed)
Tina Salinas called office requesting order for OT. Tina Salinas requested order be placed through epic and than he can see it in his system. Please advise? Cb# [phone]

## 2018-12-30 ENCOUNTER — Other Ambulatory Visit: Payer: Self-pay

## 2018-12-30 ENCOUNTER — Encounter (INDEPENDENT_AMBULATORY_CARE_PROVIDER_SITE_OTHER): Payer: Self-pay | Admitting: Vascular Surgery

## 2018-12-30 ENCOUNTER — Ambulatory Visit (INDEPENDENT_AMBULATORY_CARE_PROVIDER_SITE_OTHER): Payer: Medicare HMO | Admitting: Vascular Surgery

## 2018-12-30 VITALS — BP 117/69 | HR 62 | Resp 16 | Wt 94.0 lb

## 2018-12-30 DIAGNOSIS — I10 Essential (primary) hypertension: Secondary | ICD-10-CM

## 2018-12-30 DIAGNOSIS — I872 Venous insufficiency (chronic) (peripheral): Secondary | ICD-10-CM | POA: Diagnosis not present

## 2018-12-30 DIAGNOSIS — Z79899 Other long term (current) drug therapy: Secondary | ICD-10-CM | POA: Diagnosis not present

## 2018-12-30 DIAGNOSIS — I739 Peripheral vascular disease, unspecified: Secondary | ICD-10-CM

## 2018-12-30 DIAGNOSIS — I2510 Atherosclerotic heart disease of native coronary artery without angina pectoris: Secondary | ICD-10-CM | POA: Diagnosis not present

## 2018-12-30 DIAGNOSIS — I83023 Varicose veins of left lower extremity with ulcer of ankle: Secondary | ICD-10-CM

## 2018-12-30 DIAGNOSIS — L97329 Non-pressure chronic ulcer of left ankle with unspecified severity: Secondary | ICD-10-CM

## 2019-01-01 ENCOUNTER — Encounter (INDEPENDENT_AMBULATORY_CARE_PROVIDER_SITE_OTHER): Payer: Self-pay | Admitting: Vascular Surgery

## 2019-01-01 NOTE — Progress Notes (Signed)
MRN : [id number]  Tina Salinas is a 73 y.o. (09/01/1945) female who presents with chief complaint of  Chief Complaint  Patient presents with  . Follow-up    4week unna check  .  History of Present Illness: Patient is seen for follow up evaluation of leg pain and swelling associated with venous ulceration. The patient was recently seen here and started on Unna boot therapy.  The swelling has been better and her pain and discoloration is markedly decreased.  The patient notes that in the morning the legs are better but the leg symptoms worsened throughout the course of the day.   She notes the ulcer is better with multiple wraps  The patient notes that they were not able to tolerate the Unna boot and removed it several days ago.  The patient states that they have been elevating as much as possible. The patient denies any recent changes in medications.  The patient denies a history of DVT or PE. There is no prior history of phlebitis. There is no history of primary lymphedema.  No SOB or increased cough.  No sputum production.  No recent episodes of CHF exacerbation.   Current Meds  Medication Sig  . acyclovir (ZOVIRAX) 800 MG tablet Take 1 tablet (800 mg total) by mouth 3 (three) times daily as needed.  Marland Kitchen alendronate (FOSAMAX) 70 MG tablet Take 70 mg by mouth once a week.   . ALPRAZolam (XANAX) 0.25 MG tablet TAKE ONE TABLET BY MOUTH AT BEDTIME AS NEEDED FOR SLEEP  . amiodarone (PACERONE) 200 MG tablet Take 200 mg by mouth daily.  Marland Kitchen aspirin 81 MG tablet Take 81 mg by mouth daily.  . cholecalciferol (VITAMIN D) 1000 UNITS tablet Take 1,000 Units by mouth daily.   . collagenase (SANTYL) ointment Apply 1 application topically daily.  . cyproheptadine (PERIACTIN) 4 MG tablet Take 1 tablet (4 mg total) by mouth 3 (three) times daily. For appetite  . fluticasone (FLONASE) 50 MCG/ACT nasal spray Place 2 sprays into both nostrils daily.  . furosemide (LASIX) 20 MG tablet Take 1 tablet (20  mg total) by mouth daily.  Marland Kitchen HYDROcodone-acetaminophen (NORCO) 7.5-325 MG tablet Take 2 tablets by mouth 3 (three) times daily as needed for moderate pain.  . hydroxychloroquine (PLAQUENIL) 200 MG tablet Take 400 mg by mouth daily.   Marland Kitchen latanoprost (XALATAN) 0.005 % ophthalmic solution Place 1 drop into both eyes at bedtime.  . Methotrexate, PF, 25 MG/0.5ML SOAJ Inject 1.4 mLs into the skin once a week.  . Methylcellulose, Laxative, (GNP FIBER THERAPY PO) Take 1 Dose by mouth daily as needed (constipation).   Marland Kitchen oxybutynin (DITROPAN) 5 MG tablet TAKE 1 TABLET TWICE DAILY AS NEEDED  FOR  BLADDER  SPASMS  . pravastatin (PRAVACHOL) 40 MG tablet Take 1 tablet (40 mg total) by mouth at bedtime.  . predniSONE (DELTASONE) 2.5 MG tablet Take 2.5 mg by mouth daily with breakfast.    Past Medical History:  Diagnosis Date  . CAP (community acquired pneumonia) 11/16/2017    Past Surgical History:  Procedure Laterality Date  . BREAST ENHANCEMENT SURGERY Bilateral 1980  . CORONARY ANGIOPLASTY WITH STENT PLACEMENT  07/13/2008, 08/19/2008  . HIP ARTHROPLASTY N/A 2000  . LEFT HEART CATH AND CORONARY ANGIOGRAPHY N/A 05/16/2017   Procedure: LEFT HEART CATH AND CORONARY ANGIOGRAPHY;  Surgeon: Teodoro Spray, MD;  Location: [facility];  Service: Cardiovascular;  Laterality: N/A;  . TOTAL KNEE ARTHROPLASTY Left 09/2007, 01/2008  Social History Social History   Tobacco Use  . Smoking status: Never Smoker  . Smokeless tobacco: Never Used  Substance Use Topics  . Alcohol use: No  . Drug use: No    Family History Family History  Problem Relation Age of Onset  . Alcohol abuse Father   . Raynaud syndrome Sister     Allergies  Allergen Reactions  . Etodolac     Other reaction(s): Unknown  . Sulfa Antibiotics   . Tetracycline      REVIEW OF SYSTEMS (Negative unless checked)  Constitutional: [] Weight loss  [] Fever  [] Chills Cardiac: [] Chest pain   [] Chest pressure   [] Palpitations    [] Shortness of breath when laying flat   [] Shortness of breath with exertion. Vascular:  [] Pain in legs with walking   [] Pain in legs at rest  [] History of DVT   [] Phlebitis   [x] Swelling in legs   [] Varicose veins   [x] Non-healing ulcers Pulmonary:   [] Uses home oxygen   [] Productive cough   [] Hemoptysis   [] Wheeze  [] COPD   [] Asthma Neurologic:  [] Dizziness   [] Seizures   [] History of stroke   [] History of TIA  [] Aphasia   [] Vissual changes   [] Weakness or numbness in arm   [] Weakness or numbness in leg Musculoskeletal:   [] Joint swelling   [] Joint pain   [] Low back pain Hematologic:  [] Easy bruising  [] Easy bleeding   [] Hypercoagulable state   [] Anemic Gastrointestinal:  [] Diarrhea   [] Vomiting  [] Gastroesophageal reflux/heartburn   [] Difficulty swallowing. Genitourinary:  [] Chronic kidney disease   [] Difficult urination  [] Frequent urination   [] Blood in urine Skin:  [x] Rashes   [] Ulcers  Psychological:  [] History of anxiety   []  History of major depression.  Physical Examination  Vitals:   12/30/18 1436  BP: 117/69  Pulse: 62  Resp: 16  Weight: 94 lb (42.6 kg)   Body mass index is 18.36 kg/m. Gen: WD/WN, NAD Head: [place]/AT, No temporalis wasting.  Ear/Nose/Throat: Hearing grossly intact, nares w/o erythema or drainage Eyes: PER, EOMI, sclera nonicteric.  Neck: Supple, no large masses.   Pulmonary:  Good air movement, no audible wheezing bilaterally, no use of accessory muscles.  Cardiac: RRR, no JVD Vascular: 2-3+ edema of the left leg with severe venous changes of the left leg.  Venous ulcer noted in the ankle area on the left, noninfected almost healed Vessel Right Left  Radial Palpable Palpable  PT Trace Palpable Trace Palpable  DP Not Palpable Not Palpable  Gastrointestinal: Non-distended. No guarding/no peritoneal signs.  Musculoskeletal: M/S 5/5 throughout.  No deformity or atrophy.  Neurologic: CN 2-12 intact. Symmetrical.  Speech is fluent. Motor exam as listed above.  Psychiatric: Judgment intact, Mood & affect appropriate for pt's clinical situation. Dermatologic: Venous stasis dermatitis with small almost healed ulcer present on the left.  No changes consistent with cellulitis. Lymph : No lichenification or skin changes of chronic lymphedema.  CBC Lab Results  Component Value Date   WBC 6.1 03/01/2018   HGB 12.3 03/01/2018   HCT 36.6 03/01/2018   MCV 101 (H) 03/01/2018   PLT 221 03/01/2018    BMET    Component Value Date/Time   NA 143 07/15/2018 1532   K 3.5 07/15/2018 1532   CL 105 07/15/2018 1532   CO2 21 07/15/2018 1532   GLUCOSE 59 (L) 07/15/2018 1532   GLUCOSE 91 11/18/2017 0525   BUN 18 07/15/2018 1532   CREATININE 1.29 (H) 07/15/2018 1532   CALCIUM 8.9 07/15/2018 1532  GFRNONAA 42 (L) 07/15/2018 1532   GFRAA 48 (L) 07/15/2018 1532   CrCl cannot be calculated (Patient's most recent lab result is older than the maximum 21 days allowed.).  COAG No results found for: INR, PROTIME  Radiology No results found.    Assessment/Plan 1. Venous ulcer of ankle, left (HCC) No surgery or intervention at this point in time.    I have had a long discussion with the patient regarding venous insufficiency and why it  causes symptoms, specifically venous ulceration . I have discussed with the patient the chronic skin changes that accompany venous insufficiency and the long term sequela such as infection and recurring  ulceration.  Patient will be placed in [employer] which will be changed weekly drainage permitting.  In addition, behavioral modification including several periods of elevation of the lower extremities during the day will be continued. Achieving a position with the ankles at heart level was stressed to the patient  The patient is instructed to begin routine exercise, especially walking on a daily basis  Patient should undergo duplex ultrasound of the venous system to ensure that DVT or reflux is not present.  Following the  review of the ultrasound the patient will follow up in one week to reassess the degree of swelling and the control that Unna therapy is offering.   The patient can be assessed for graduated compression stockings or wraps as well as a Lymph Pump once the ulcers are healed.   2. Chronic venous insufficiency No surgery or intervention at this point in time.    I have had a long discussion with the patient regarding venous insufficiency and why it  causes symptoms. I have discussed with the patient the chronic skin changes that accompany venous insufficiency and the long term sequela such as infection and ulceration.  Patient will begin wearing graduated compression stockings class 1 (20-30 mmHg) or compression wraps on a daily basis a prescription was given. The patient will put the stockings on first thing in the morning and removing them in the evening. The patient is instructed specifically not to sleep in the stockings.    In addition, behavioral modification including several periods of elevation of the lower extremities during the day will be continued. I have demonstrated that proper elevation is a position with the ankles at heart level.  The patient is instructed to begin routine exercise, especially walking on a daily basis   3. PAD (peripheral artery disease) (HCC) Recommend:  I do not find evidence of Vascular pathology that would explain the patient's symptoms  The patient has atypical pain symptoms for vascular disease  Noninvasive studies including venous ultrasound of the legs do not identify vascular problems  The patient should continue walking and begin a more formal exercise program. The patient should continue his antiplatelet therapy and aggressive treatment of the lipid abnormalities. The patient should continue wearing graduated compression socks 20-30 mmHg strength to control her mild edema.  4. Essential (primary) hypertension Continue antihypertensive medications as  already ordered, these medications have been reviewed and there are no changes at this time.   5. Arteriosclerosis of coronary artery Continue cardiac and antihypertensive medications as already ordered and reviewed, no changes at this time.  Continue statin as ordered and reviewed, no changes at this time  Nitrates PRN for chest pain      Hortencia Pilar, MD  01/01/2019 9:24 PM

## 2019-01-02 ENCOUNTER — Telehealth (INDEPENDENT_AMBULATORY_CARE_PROVIDER_SITE_OTHER): Payer: Self-pay | Admitting: Nurse Practitioner

## 2019-01-02 ENCOUNTER — Telehealth (INDEPENDENT_AMBULATORY_CARE_PROVIDER_SITE_OTHER): Payer: Self-pay | Admitting: Vascular Surgery

## 2019-01-02 NOTE — Telephone Encounter (Signed)
Patient called our office and stated that she was trying to get compression socks from medical supplier and that she didn't think the compression socks would work because her feet are really small and that she is unable to put the socks on by herself. I spoke with Dr. Delana Meyer who advised that the RX given to patient on 6/15 was for compression wraps not compression socks and that patient would have an easier time using them. Patient was advised of this but still stated she didn't think the wraps would work either and that she would try them but she didn't want to. AS, CMA

## 2019-01-02 NOTE — Telephone Encounter (Signed)
Sherry with [facility] calling stating that patient advised her that she was not getting Unna wraps done that she was going to be wearing compression socks for now on.  I looked back at the last office visit with Schnier from 6/15- we put patient back in UNNA boots and he gave patient a rx for compression wraps to use after we were done with Unna boots. I advised Judeen Hammans of this and she said she thought the patient was trying to avoid her but she would reach back out to schedule home visit for unna wrap change and call if she had any further issues. Schnier is aware of this. AS, CMA

## 2019-01-03 DIAGNOSIS — I480 Paroxysmal atrial fibrillation: Secondary | ICD-10-CM | POA: Diagnosis not present

## 2019-01-03 DIAGNOSIS — Z4800 Encounter for change or removal of nonsurgical wound dressing: Secondary | ICD-10-CM | POA: Diagnosis not present

## 2019-01-03 DIAGNOSIS — I2510 Atherosclerotic heart disease of native coronary artery without angina pectoris: Secondary | ICD-10-CM | POA: Diagnosis not present

## 2019-01-03 DIAGNOSIS — I10 Essential (primary) hypertension: Secondary | ICD-10-CM | POA: Diagnosis not present

## 2019-01-03 DIAGNOSIS — Z7982 Long term (current) use of aspirin: Secondary | ICD-10-CM | POA: Diagnosis not present

## 2019-01-03 DIAGNOSIS — Z955 Presence of coronary angioplasty implant and graft: Secondary | ICD-10-CM | POA: Diagnosis not present

## 2019-01-03 DIAGNOSIS — L97321 Non-pressure chronic ulcer of left ankle limited to breakdown of skin: Secondary | ICD-10-CM | POA: Diagnosis not present

## 2019-01-03 DIAGNOSIS — I872 Venous insufficiency (chronic) (peripheral): Secondary | ICD-10-CM | POA: Diagnosis not present

## 2019-01-03 DIAGNOSIS — Z7952 Long term (current) use of systemic steroids: Secondary | ICD-10-CM | POA: Diagnosis not present

## 2019-01-06 ENCOUNTER — Telehealth: Payer: Self-pay

## 2019-01-06 DIAGNOSIS — I872 Venous insufficiency (chronic) (peripheral): Secondary | ICD-10-CM | POA: Diagnosis not present

## 2019-01-06 DIAGNOSIS — Z955 Presence of coronary angioplasty implant and graft: Secondary | ICD-10-CM | POA: Diagnosis not present

## 2019-01-06 DIAGNOSIS — I480 Paroxysmal atrial fibrillation: Secondary | ICD-10-CM | POA: Diagnosis not present

## 2019-01-06 DIAGNOSIS — Z7952 Long term (current) use of systemic steroids: Secondary | ICD-10-CM | POA: Diagnosis not present

## 2019-01-06 DIAGNOSIS — L97321 Non-pressure chronic ulcer of left ankle limited to breakdown of skin: Secondary | ICD-10-CM | POA: Diagnosis not present

## 2019-01-06 DIAGNOSIS — I428 Other cardiomyopathies: Secondary | ICD-10-CM | POA: Diagnosis not present

## 2019-01-06 DIAGNOSIS — R0602 Shortness of breath: Secondary | ICD-10-CM | POA: Diagnosis not present

## 2019-01-06 DIAGNOSIS — I779 Disorder of arteries and arterioles, unspecified: Secondary | ICD-10-CM | POA: Diagnosis not present

## 2019-01-06 DIAGNOSIS — I10 Essential (primary) hypertension: Secondary | ICD-10-CM | POA: Diagnosis not present

## 2019-01-06 DIAGNOSIS — G809 Cerebral palsy, unspecified: Secondary | ICD-10-CM | POA: Diagnosis not present

## 2019-01-06 DIAGNOSIS — I2510 Atherosclerotic heart disease of native coronary artery without angina pectoris: Secondary | ICD-10-CM | POA: Diagnosis not present

## 2019-01-06 DIAGNOSIS — Z4800 Encounter for change or removal of nonsurgical wound dressing: Secondary | ICD-10-CM | POA: Diagnosis not present

## 2019-01-06 DIAGNOSIS — I25118 Atherosclerotic heart disease of native coronary artery with other forms of angina pectoris: Secondary | ICD-10-CM | POA: Diagnosis not present

## 2019-01-06 DIAGNOSIS — Z7982 Long term (current) use of aspirin: Secondary | ICD-10-CM | POA: Diagnosis not present

## 2019-01-06 NOTE — Telephone Encounter (Signed)
Esther occupational therapist for [facility] had called requesting verbal orders for 1 week x1 and 2 weeks x2. KW

## 2019-01-07 ENCOUNTER — Telehealth: Payer: Self-pay | Admitting: Family Medicine

## 2019-01-07 ENCOUNTER — Telehealth (INDEPENDENT_AMBULATORY_CARE_PROVIDER_SITE_OTHER): Payer: Self-pay

## 2019-01-07 DIAGNOSIS — Z4800 Encounter for change or removal of nonsurgical wound dressing: Secondary | ICD-10-CM | POA: Diagnosis not present

## 2019-01-07 DIAGNOSIS — L97321 Non-pressure chronic ulcer of left ankle limited to breakdown of skin: Secondary | ICD-10-CM | POA: Diagnosis not present

## 2019-01-07 DIAGNOSIS — Z7982 Long term (current) use of aspirin: Secondary | ICD-10-CM | POA: Diagnosis not present

## 2019-01-07 DIAGNOSIS — I480 Paroxysmal atrial fibrillation: Secondary | ICD-10-CM | POA: Diagnosis not present

## 2019-01-07 DIAGNOSIS — Z7952 Long term (current) use of systemic steroids: Secondary | ICD-10-CM | POA: Diagnosis not present

## 2019-01-07 DIAGNOSIS — I10 Essential (primary) hypertension: Secondary | ICD-10-CM | POA: Diagnosis not present

## 2019-01-07 DIAGNOSIS — I872 Venous insufficiency (chronic) (peripheral): Secondary | ICD-10-CM | POA: Diagnosis not present

## 2019-01-07 DIAGNOSIS — I2510 Atherosclerotic heart disease of native coronary artery without angina pectoris: Secondary | ICD-10-CM | POA: Diagnosis not present

## 2019-01-07 DIAGNOSIS — Z955 Presence of coronary angioplasty implant and graft: Secondary | ICD-10-CM | POA: Diagnosis not present

## 2019-01-07 NOTE — Telephone Encounter (Signed)
[facility] nurse left a message informing the left leg was not ready to come out the unna wrap and she has place a unna boot.The nurse will evaluate the left leg on next visit

## 2019-01-07 NOTE — Telephone Encounter (Signed)
[name] advised.    Thanks,   -Kaula Klenke  

## 2019-01-07 NOTE — Telephone Encounter (Signed)
Raquel Sarna with [facility] is requesting verbal orders for in home PT as follows"  Twice a week for 3 weeks Once a week for 1 week  Please advise. Thanks TNP

## 2019-01-07 NOTE — Telephone Encounter (Signed)
That's fine

## 2019-01-08 DIAGNOSIS — I872 Venous insufficiency (chronic) (peripheral): Secondary | ICD-10-CM | POA: Diagnosis not present

## 2019-01-08 DIAGNOSIS — Z955 Presence of coronary angioplasty implant and graft: Secondary | ICD-10-CM | POA: Diagnosis not present

## 2019-01-08 DIAGNOSIS — L97321 Non-pressure chronic ulcer of left ankle limited to breakdown of skin: Secondary | ICD-10-CM | POA: Diagnosis not present

## 2019-01-08 DIAGNOSIS — Z4800 Encounter for change or removal of nonsurgical wound dressing: Secondary | ICD-10-CM | POA: Diagnosis not present

## 2019-01-08 DIAGNOSIS — I2510 Atherosclerotic heart disease of native coronary artery without angina pectoris: Secondary | ICD-10-CM | POA: Diagnosis not present

## 2019-01-08 DIAGNOSIS — I10 Essential (primary) hypertension: Secondary | ICD-10-CM | POA: Diagnosis not present

## 2019-01-08 DIAGNOSIS — I480 Paroxysmal atrial fibrillation: Secondary | ICD-10-CM | POA: Diagnosis not present

## 2019-01-08 DIAGNOSIS — Z7952 Long term (current) use of systemic steroids: Secondary | ICD-10-CM | POA: Diagnosis not present

## 2019-01-08 DIAGNOSIS — Z7982 Long term (current) use of aspirin: Secondary | ICD-10-CM | POA: Diagnosis not present

## 2019-01-08 NOTE — Telephone Encounter (Signed)
Tina Salinas with [facility] is requesting STAT verbal orders. She called on 6/22 to request orders. She is trying to see patient this afternoon. Please advise.

## 2019-01-08 NOTE — Telephone Encounter (Signed)
That's fine

## 2019-01-08 NOTE — Telephone Encounter (Signed)
Ester advised.   Thanks,   -Thadius Smisek  

## 2019-01-09 DIAGNOSIS — I872 Venous insufficiency (chronic) (peripheral): Secondary | ICD-10-CM | POA: Diagnosis not present

## 2019-01-09 DIAGNOSIS — Z7952 Long term (current) use of systemic steroids: Secondary | ICD-10-CM | POA: Diagnosis not present

## 2019-01-09 DIAGNOSIS — I480 Paroxysmal atrial fibrillation: Secondary | ICD-10-CM | POA: Diagnosis not present

## 2019-01-09 DIAGNOSIS — L97321 Non-pressure chronic ulcer of left ankle limited to breakdown of skin: Secondary | ICD-10-CM | POA: Diagnosis not present

## 2019-01-09 DIAGNOSIS — I2510 Atherosclerotic heart disease of native coronary artery without angina pectoris: Secondary | ICD-10-CM | POA: Diagnosis not present

## 2019-01-09 DIAGNOSIS — Z955 Presence of coronary angioplasty implant and graft: Secondary | ICD-10-CM | POA: Diagnosis not present

## 2019-01-09 DIAGNOSIS — I10 Essential (primary) hypertension: Secondary | ICD-10-CM | POA: Diagnosis not present

## 2019-01-09 DIAGNOSIS — Z4800 Encounter for change or removal of nonsurgical wound dressing: Secondary | ICD-10-CM | POA: Diagnosis not present

## 2019-01-09 DIAGNOSIS — Z7982 Long term (current) use of aspirin: Secondary | ICD-10-CM | POA: Diagnosis not present

## 2019-01-10 ENCOUNTER — Telehealth: Payer: Self-pay

## 2019-01-10 NOTE — Telephone Encounter (Signed)
Pt advised.   Thanks,   -Jassiel Flye  

## 2019-01-10 NOTE — Telephone Encounter (Signed)
That's fine

## 2019-01-10 NOTE — Telephone Encounter (Signed)
Esther with [facility] wants to let Dr.fisher know that patient missed a visit this week and is going to miss next week because of the holiday.  She would like to change the OT frequency to 1 week 3 please.   ZV#[phone]

## 2019-01-13 DIAGNOSIS — M0579 Rheumatoid arthritis with rheumatoid factor of multiple sites without organ or systems involvement: Secondary | ICD-10-CM | POA: Diagnosis not present

## 2019-01-13 DIAGNOSIS — Z79899 Other long term (current) drug therapy: Secondary | ICD-10-CM | POA: Diagnosis not present

## 2019-01-14 DIAGNOSIS — I10 Essential (primary) hypertension: Secondary | ICD-10-CM | POA: Diagnosis not present

## 2019-01-14 DIAGNOSIS — Z7952 Long term (current) use of systemic steroids: Secondary | ICD-10-CM | POA: Diagnosis not present

## 2019-01-14 DIAGNOSIS — I872 Venous insufficiency (chronic) (peripheral): Secondary | ICD-10-CM | POA: Diagnosis not present

## 2019-01-14 DIAGNOSIS — Z955 Presence of coronary angioplasty implant and graft: Secondary | ICD-10-CM | POA: Diagnosis not present

## 2019-01-14 DIAGNOSIS — I2510 Atherosclerotic heart disease of native coronary artery without angina pectoris: Secondary | ICD-10-CM | POA: Diagnosis not present

## 2019-01-14 DIAGNOSIS — L97321 Non-pressure chronic ulcer of left ankle limited to breakdown of skin: Secondary | ICD-10-CM | POA: Diagnosis not present

## 2019-01-14 DIAGNOSIS — I480 Paroxysmal atrial fibrillation: Secondary | ICD-10-CM | POA: Diagnosis not present

## 2019-01-14 DIAGNOSIS — Z4800 Encounter for change or removal of nonsurgical wound dressing: Secondary | ICD-10-CM | POA: Diagnosis not present

## 2019-01-14 DIAGNOSIS — Z7982 Long term (current) use of aspirin: Secondary | ICD-10-CM | POA: Diagnosis not present

## 2019-01-16 DIAGNOSIS — I10 Essential (primary) hypertension: Secondary | ICD-10-CM | POA: Diagnosis not present

## 2019-01-16 DIAGNOSIS — L97321 Non-pressure chronic ulcer of left ankle limited to breakdown of skin: Secondary | ICD-10-CM | POA: Diagnosis not present

## 2019-01-16 DIAGNOSIS — Z7952 Long term (current) use of systemic steroids: Secondary | ICD-10-CM | POA: Diagnosis not present

## 2019-01-16 DIAGNOSIS — I872 Venous insufficiency (chronic) (peripheral): Secondary | ICD-10-CM | POA: Diagnosis not present

## 2019-01-16 DIAGNOSIS — Z955 Presence of coronary angioplasty implant and graft: Secondary | ICD-10-CM | POA: Diagnosis not present

## 2019-01-16 DIAGNOSIS — Z7982 Long term (current) use of aspirin: Secondary | ICD-10-CM | POA: Diagnosis not present

## 2019-01-16 DIAGNOSIS — I2510 Atherosclerotic heart disease of native coronary artery without angina pectoris: Secondary | ICD-10-CM | POA: Diagnosis not present

## 2019-01-16 DIAGNOSIS — Z4800 Encounter for change or removal of nonsurgical wound dressing: Secondary | ICD-10-CM | POA: Diagnosis not present

## 2019-01-16 DIAGNOSIS — I480 Paroxysmal atrial fibrillation: Secondary | ICD-10-CM | POA: Diagnosis not present

## 2019-01-20 ENCOUNTER — Ambulatory Visit (INDEPENDENT_AMBULATORY_CARE_PROVIDER_SITE_OTHER): Payer: Medicare HMO | Admitting: Family Medicine

## 2019-01-20 ENCOUNTER — Telehealth: Payer: Self-pay | Admitting: Family Medicine

## 2019-01-20 ENCOUNTER — Other Ambulatory Visit: Payer: Self-pay

## 2019-01-20 ENCOUNTER — Encounter: Payer: Self-pay | Admitting: Family Medicine

## 2019-01-20 VITALS — BP 110/60 | HR 65 | Temp 98.4°F | Resp 16 | Wt 91.0 lb

## 2019-01-20 DIAGNOSIS — L97929 Non-pressure chronic ulcer of unspecified part of left lower leg with unspecified severity: Secondary | ICD-10-CM | POA: Diagnosis not present

## 2019-01-20 DIAGNOSIS — I5022 Chronic systolic (congestive) heart failure: Secondary | ICD-10-CM | POA: Diagnosis not present

## 2019-01-20 DIAGNOSIS — E440 Moderate protein-calorie malnutrition: Secondary | ICD-10-CM

## 2019-01-20 DIAGNOSIS — M05741 Rheumatoid arthritis with rheumatoid factor of right hand without organ or systems involvement: Secondary | ICD-10-CM

## 2019-01-20 DIAGNOSIS — G809 Cerebral palsy, unspecified: Secondary | ICD-10-CM

## 2019-01-20 DIAGNOSIS — E039 Hypothyroidism, unspecified: Secondary | ICD-10-CM | POA: Diagnosis not present

## 2019-01-20 DIAGNOSIS — E038 Other specified hypothyroidism: Secondary | ICD-10-CM

## 2019-01-20 MED ORDER — HYDROCODONE-ACETAMINOPHEN 7.5-325 MG PO TABS: 2.0000 | ORAL_TABLET | Freq: Three times a day (TID) | ORAL | 0 refills | Status: DC | PRN

## 2019-01-20 MED ORDER — CYPROHEPTADINE HCL 4 MG PO TABS: 4.0000 mg | ORAL_TABLET | Freq: Three times a day (TID) | ORAL | 3 refills | Status: DC

## 2019-01-20 NOTE — Progress Notes (Signed)
Patient: Tina Salinas Female    DOB: [birth year]   73 y.o.   MRN: [id number] Visit Date: 01/20/2019  Today's Provider: Lelon Huh, MD   Chief Complaint  Patient presents with  . Follow-up   Subjective:     HPI  Follow up for Chronic Low back pain:  The patient was last seen for this 3 months ago. Changes made at last visit include none.  She reports good compliance with treatment. She feels that condition is stable. She is not having side effects.   ------------------------------------------------------------------------------------  Follow up for Rheumatoid Arthritis:  The patient was last seen for this 3 months ago. Changes made at last visit include none.  She reports good compliance with treatment. She feels that condition is stable. She is not having side effects.  ------------------------------------------------------------------------------------  Follow up for Cerebral Palsy:   The patient was last seen for this 3 months ago. Changes made at last visit include none.  She reports good compliance with treatment. She feels that condition is stable. She is not having side effects.   ------------------------------------------------------------------------------------  Follow up for Hypothyroidism:  The patient was last seen for this 6 months ago. Changes made at last visit include none. Lab Results  Component Value Date   TSH 2.440 07/15/2018    She reports good compliance with treatment. She feels that condition is Unchanged. She is not having side effects.   ------------------------------------------------------------------------------------  Allergies  Allergen Reactions  . Etodolac     Other reaction(s): Unknown  . Sulfa Antibiotics   . Tetracycline      Current Outpatient Medications:  .  acyclovir (ZOVIRAX) 800 MG tablet, Take 1 tablet (800 mg total) by mouth 3 (three) times daily as needed., Disp: 270 tablet, Rfl: 2 .   alendronate (FOSAMAX) 70 MG tablet, Take 70 mg by mouth once a week. , Disp: , Rfl:  .  ALPRAZolam (XANAX) 0.25 MG tablet, TAKE ONE TABLET BY MOUTH AT BEDTIME AS NEEDED FOR SLEEP, Disp: 90 tablet, Rfl: 1 .  amiodarone (PACERONE) 200 MG tablet, Take 200 mg by mouth daily., Disp: , Rfl:  .  aspirin 81 MG tablet, Take 81 mg by mouth daily., Disp: , Rfl:  .  cholecalciferol (VITAMIN D) 1000 UNITS tablet, Take 1,000 Units by mouth daily. , Disp: , Rfl:  .  collagenase (SANTYL) ointment, Apply 1 application topically daily., Disp: 90 g, Rfl: 3 .  cyproheptadine (PERIACTIN) 4 MG tablet, Take 1 tablet (4 mg total) by mouth 3 (three) times daily. For appetite, Disp: 60 tablet, Rfl: 5 .  fluticasone (FLONASE) 50 MCG/ACT nasal spray, Place 2 sprays into both nostrils daily., Disp: 16 g, Rfl: 6 .  furosemide (LASIX) 20 MG tablet, Take 1 tablet (20 mg total) by mouth daily., Disp: 30 tablet, Rfl: 1 .  HYDROcodone-acetaminophen (NORCO) 7.5-325 MG tablet, Take 2 tablets by mouth 3 (three) times daily as needed for moderate pain., Disp: 540 tablet, Rfl: 0 .  hydroxychloroquine (PLAQUENIL) 200 MG tablet, Take 400 mg by mouth daily. , Disp: , Rfl:  .  latanoprost (XALATAN) 0.005 % ophthalmic solution, Place 1 drop into both eyes at bedtime., Disp: , Rfl:  .  Methotrexate, PF, 25 MG/0.5ML SOAJ, Inject 1.4 mLs into the skin once a week., Disp: , Rfl:  .  Methylcellulose, Laxative, (GNP FIBER THERAPY PO), Take 1 Dose by mouth daily as needed (constipation). , Disp: , Rfl:  .  oxybutynin (DITROPAN) 5 MG tablet, TAKE 1  TABLET TWICE DAILY AS NEEDED  FOR  BLADDER  SPASMS, Disp: 180 tablet, Rfl: 4 .  pravastatin (PRAVACHOL) 40 MG tablet, Take 1 tablet (40 mg total) by mouth at bedtime., Disp: 30 tablet, Rfl: 0 .  predniSONE (DELTASONE) 2.5 MG tablet, Take 2.5 mg by mouth daily with breakfast., Disp: , Rfl:  .  carvedilol (COREG) 3.125 MG tablet, Take 1 tablet (3.125 mg total) by mouth 2 (two) times daily., Disp: 60 tablet,  Rfl: 3  Review of Systems  Constitutional: Negative for appetite change, chills, fatigue and fever.  Respiratory: Negative for chest tightness and shortness of breath.   Cardiovascular: Negative for chest pain and palpitations.  Gastrointestinal: Negative for abdominal pain, nausea and vomiting.  Neurological: Negative for dizziness and weakness.    Social History   Tobacco Use  . Smoking status: Never Smoker  . Smokeless tobacco: Never Used  Substance Use Topics  . Alcohol use: No      Objective:   BP 110/60 (BP Location: Left Arm, Patient Position: Sitting, Cuff Size: Normal)   Pulse 65   Temp 98.4 F (36.9 C) (Oral)   Resp 16   Wt 91 lb (41.3 kg)   SpO2 98% Comment: room air  BMI 17.77 kg/m  Vitals:   01/20/19 1403  BP: 110/60  Pulse: 65  Resp: 16  Temp: 98.4 F (36.9 C)  TempSrc: Oral  SpO2: 98%  Weight: 91 lb (41.3 kg)     Physical Exam    General Appearance:    Alert, cooperative, no distress  Eyes:    PERRL, conjunctiva/corneas clear, EOM's intact       Lungs:     Clear to auscultation bilaterally, respirations unlabored  Heart:    Regular rate and rhythm  Neurologic:   Awake, alert, oriented x 3. No apparent focal neurological           defect.          Assessment & Plan    1. Skin ulcer of left lower leg, unspecified ulcer stage ([place]) Appears to be abrasion or burn. She is to continue using neosporin and call if not continuing to improved.   2. Chronic systolic heart failure (HCC) Stable from CV standpoint. Continue current medications and routine follow up Dr. Ubaldo Glassing.   3. Cerebral palsy, unspecified type ([place])   4. Malnutrition of moderate degree continue- cyproheptadine (PERIACTIN) 4 MG tablet; Take 1 tablet (4 mg total) by mouth 3 (three) times daily. For appetite  Dispense: 270 tablet; Refill: 3  5. Subclinical hypothyroidism Asymptomatic. Compliant with medication.  Continue aggressive risk factor modification.    6. 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  Dispense: 540 tablet; Refill: 0  Continue follow up with Dr. Jefm Bryant  Future Appointments  Date Time Provider [place]  07/22/2019  2:00 PM Caryn Section, Kirstie Peri, MD [facility] None    The entirety of the information documented in the History of Present Illness, Review of Systems and Physical Exam were personally obtained by me. Portions of this information were initially documented by Meyer Cory, CMA and reviewed by me for thoroughness and accuracy.      Lelon Huh, MD  [facility]

## 2019-01-20 NOTE — Telephone Encounter (Signed)
Sherri with [facility] called saying home health aid is discontinuing home health.for patient per patient request.  They will still be doing nurse FU's  FYI.  Thanks [employer]

## 2019-01-21 DIAGNOSIS — Z7982 Long term (current) use of aspirin: Secondary | ICD-10-CM | POA: Diagnosis not present

## 2019-01-21 DIAGNOSIS — Z955 Presence of coronary angioplasty implant and graft: Secondary | ICD-10-CM | POA: Diagnosis not present

## 2019-01-21 DIAGNOSIS — Z7952 Long term (current) use of systemic steroids: Secondary | ICD-10-CM | POA: Diagnosis not present

## 2019-01-21 DIAGNOSIS — I10 Essential (primary) hypertension: Secondary | ICD-10-CM | POA: Diagnosis not present

## 2019-01-21 DIAGNOSIS — L97321 Non-pressure chronic ulcer of left ankle limited to breakdown of skin: Secondary | ICD-10-CM | POA: Diagnosis not present

## 2019-01-21 DIAGNOSIS — Z4800 Encounter for change or removal of nonsurgical wound dressing: Secondary | ICD-10-CM | POA: Diagnosis not present

## 2019-01-21 DIAGNOSIS — I480 Paroxysmal atrial fibrillation: Secondary | ICD-10-CM | POA: Diagnosis not present

## 2019-01-21 DIAGNOSIS — I2510 Atherosclerotic heart disease of native coronary artery without angina pectoris: Secondary | ICD-10-CM | POA: Diagnosis not present

## 2019-01-21 DIAGNOSIS — I872 Venous insufficiency (chronic) (peripheral): Secondary | ICD-10-CM | POA: Diagnosis not present

## 2019-01-22 ENCOUNTER — Telehealth: Payer: Self-pay

## 2019-01-22 ENCOUNTER — Telehealth (INDEPENDENT_AMBULATORY_CARE_PROVIDER_SITE_OTHER): Payer: Self-pay

## 2019-01-22 DIAGNOSIS — Z7982 Long term (current) use of aspirin: Secondary | ICD-10-CM | POA: Diagnosis not present

## 2019-01-22 DIAGNOSIS — Z955 Presence of coronary angioplasty implant and graft: Secondary | ICD-10-CM | POA: Diagnosis not present

## 2019-01-22 DIAGNOSIS — L97321 Non-pressure chronic ulcer of left ankle limited to breakdown of skin: Secondary | ICD-10-CM | POA: Diagnosis not present

## 2019-01-22 DIAGNOSIS — I480 Paroxysmal atrial fibrillation: Secondary | ICD-10-CM | POA: Diagnosis not present

## 2019-01-22 DIAGNOSIS — I2510 Atherosclerotic heart disease of native coronary artery without angina pectoris: Secondary | ICD-10-CM | POA: Diagnosis not present

## 2019-01-22 DIAGNOSIS — I872 Venous insufficiency (chronic) (peripheral): Secondary | ICD-10-CM | POA: Diagnosis not present

## 2019-01-22 DIAGNOSIS — Z4800 Encounter for change or removal of nonsurgical wound dressing: Secondary | ICD-10-CM | POA: Diagnosis not present

## 2019-01-22 DIAGNOSIS — Z7952 Long term (current) use of systemic steroids: Secondary | ICD-10-CM | POA: Diagnosis not present

## 2019-01-22 DIAGNOSIS — I10 Essential (primary) hypertension: Secondary | ICD-10-CM | POA: Diagnosis not present

## 2019-01-22 MED ORDER — CEPHALEXIN 500 MG PO CAPS: 500.0000 mg | ORAL_CAPSULE | Freq: Four times a day (QID) | ORAL | 0 refills | Status: AC

## 2019-01-22 NOTE — Telephone Encounter (Signed)
Patient called our office again asking if we were the one who would do the prior auth for her compression socks. I spoke with Arna Medici and she advised that the pharmacy or medical supply company would be the ones to do the prior [place]. I advised patient and she verbalized understanding.  She is still requesting that we speak with Schnier. AS, CMA

## 2019-01-22 NOTE — Telephone Encounter (Signed)
Have sent [facility]

## 2019-01-22 NOTE — Telephone Encounter (Signed)
Pt states her Physical Therapy is scheduled to end on 02/01/2019.  She would like an order to have it continued beyond that.   Contact number: [phone]   Thanks,   -Mickel Baas

## 2019-01-22 NOTE — Telephone Encounter (Signed)
I called Tina Salinas  and advised her as below. Tina Salinas states she would call and inform patient that an antibiotic has been sent into the pharmacy.

## 2019-01-22 NOTE — Telephone Encounter (Signed)
Tina Salinas from [facility] called to report may have a bedbug bite on her right lower leg.  She stated her leg is swollen, tender and slightly red.  Sherri is concerned pt may be getting cellulitis.  She is wanting to know if [name] should start an antibiotic?   Contact Sherri at [phone]   Thanks,    -Mickel Baas

## 2019-01-22 NOTE — Telephone Encounter (Signed)
That's fine

## 2019-01-22 NOTE — Telephone Encounter (Signed)
Patient and home health nurse left a message on the voicemail about patient coming out the [place] boot.I spoke with Tina Salinas and she informed that the wound has healed but she did placed the unna boot back on the patient because she wanted a 2nd opinion with our office before coming out of the unna boot.The nurse did inform the patient does has PT and OT coming in the home and they can show the patient to proper way to put the wraps on.I spoke with patient and she concerned with coming out of the unna boot cause there is a scab and doesn't want it to come off while taking a bath and the wound reopen.The patient stated she has not received her compressions wraps because she waiting to see if insurance will cover the cost.She will be calling the medical supply store today to see the status of the compression wraps.I will be speaking Dr Delana Meyer tomorrow

## 2019-01-23 NOTE — Telephone Encounter (Signed)
I spoke with Dr Delana Meyer and advise for the patient to come in the office within 2 weeks and also the patient should stay in unna wraps until she received the compression wraps. I left a message on the patient voicemail to return a call to the office and I spoke with Judeen Hammans and informed her with Dr Delana Meyer medical recommendations.

## 2019-01-23 NOTE — Telephone Encounter (Signed)
I spoke with patient and informed her with Dr Delana Meyer medical recommendations and the patient verbalize understanding,also the patient hs been schedule to come in 02/06/2019 @2 :30

## 2019-01-24 ENCOUNTER — Telehealth: Payer: Self-pay | Admitting: Family Medicine

## 2019-01-24 NOTE — Telephone Encounter (Signed)
Tina Salinas w/ Tina Salinas  [place] wanted to let Dr. Caryn Section know pt has a missed PT visit for today.   Pt was not home when she arrived.  Thanks, [employer]

## 2019-01-28 ENCOUNTER — Telehealth: Payer: Self-pay

## 2019-01-28 DIAGNOSIS — L97321 Non-pressure chronic ulcer of left ankle limited to breakdown of skin: Secondary | ICD-10-CM | POA: Diagnosis not present

## 2019-01-28 DIAGNOSIS — Z7982 Long term (current) use of aspirin: Secondary | ICD-10-CM | POA: Diagnosis not present

## 2019-01-28 DIAGNOSIS — Z4800 Encounter for change or removal of nonsurgical wound dressing: Secondary | ICD-10-CM | POA: Diagnosis not present

## 2019-01-28 DIAGNOSIS — I872 Venous insufficiency (chronic) (peripheral): Secondary | ICD-10-CM | POA: Diagnosis not present

## 2019-01-28 DIAGNOSIS — I10 Essential (primary) hypertension: Secondary | ICD-10-CM | POA: Diagnosis not present

## 2019-01-28 DIAGNOSIS — Z7952 Long term (current) use of systemic steroids: Secondary | ICD-10-CM | POA: Diagnosis not present

## 2019-01-28 DIAGNOSIS — I2510 Atherosclerotic heart disease of native coronary artery without angina pectoris: Secondary | ICD-10-CM | POA: Diagnosis not present

## 2019-01-28 DIAGNOSIS — I480 Paroxysmal atrial fibrillation: Secondary | ICD-10-CM | POA: Diagnosis not present

## 2019-01-28 DIAGNOSIS — Z955 Presence of coronary angioplasty implant and graft: Secondary | ICD-10-CM | POA: Diagnosis not present

## 2019-01-28 NOTE — Patient Instructions (Signed)
.   Please review the attached list of medications and notify my office if there are any errors.   . Please bring all of your medications to every appointment so we can make sure that our medication list is the same as yours.   . We will have flu vaccines available after Labor Day. Please go to your pharmacy or call the office in early September to schedule you flu shot.   

## 2019-01-28 NOTE — Telephone Encounter (Signed)
Please advise 

## 2019-01-28 NOTE — Telephone Encounter (Signed)
[name] advised.    Thanks,   -Afsa Meany  

## 2019-01-28 NOTE — Telephone Encounter (Signed)
Raquel Sarna w/ [facility] called and requested 1 this week and 2 visit next week for PT. Raquel Sarna contact number is [phone]. Please advise

## 2019-01-28 NOTE — Telephone Encounter (Signed)
That's fine

## 2019-01-29 ENCOUNTER — Telehealth: Payer: Self-pay

## 2019-01-29 ENCOUNTER — Telehealth: Payer: Self-pay | Admitting: Family Medicine

## 2019-01-29 NOTE — Telephone Encounter (Signed)
Pt called saying she is having itching and needs someone to call her back  CB# [phone]    Thanks teri

## 2019-01-29 NOTE — Telephone Encounter (Signed)
  Patient has questions about medications and swelling in legs  CB# [phone]

## 2019-01-30 DIAGNOSIS — Z7982 Long term (current) use of aspirin: Secondary | ICD-10-CM | POA: Diagnosis not present

## 2019-01-30 DIAGNOSIS — Z7952 Long term (current) use of systemic steroids: Secondary | ICD-10-CM | POA: Diagnosis not present

## 2019-01-30 DIAGNOSIS — I2510 Atherosclerotic heart disease of native coronary artery without angina pectoris: Secondary | ICD-10-CM | POA: Diagnosis not present

## 2019-01-30 DIAGNOSIS — I872 Venous insufficiency (chronic) (peripheral): Secondary | ICD-10-CM | POA: Diagnosis not present

## 2019-01-30 DIAGNOSIS — I480 Paroxysmal atrial fibrillation: Secondary | ICD-10-CM | POA: Diagnosis not present

## 2019-01-30 DIAGNOSIS — Z955 Presence of coronary angioplasty implant and graft: Secondary | ICD-10-CM | POA: Diagnosis not present

## 2019-01-30 DIAGNOSIS — Z4800 Encounter for change or removal of nonsurgical wound dressing: Secondary | ICD-10-CM | POA: Diagnosis not present

## 2019-01-30 DIAGNOSIS — I10 Essential (primary) hypertension: Secondary | ICD-10-CM | POA: Diagnosis not present

## 2019-01-30 DIAGNOSIS — L97321 Non-pressure chronic ulcer of left ankle limited to breakdown of skin: Secondary | ICD-10-CM | POA: Diagnosis not present

## 2019-01-30 NOTE — Telephone Encounter (Signed)
Pt is itching secondary to bed bug bites.  Is there something she can take to help with the itching?   Please advise.    Thanks,   -Mickel Baas

## 2019-01-31 NOTE — Telephone Encounter (Signed)
Can apply 1% hydrocortisone and benadryl cream 3-4 times a day.

## 2019-02-04 DIAGNOSIS — L97321 Non-pressure chronic ulcer of left ankle limited to breakdown of skin: Secondary | ICD-10-CM | POA: Diagnosis not present

## 2019-02-04 DIAGNOSIS — Z7982 Long term (current) use of aspirin: Secondary | ICD-10-CM | POA: Diagnosis not present

## 2019-02-04 DIAGNOSIS — I872 Venous insufficiency (chronic) (peripheral): Secondary | ICD-10-CM | POA: Diagnosis not present

## 2019-02-04 DIAGNOSIS — Z4800 Encounter for change or removal of nonsurgical wound dressing: Secondary | ICD-10-CM | POA: Diagnosis not present

## 2019-02-04 DIAGNOSIS — I2510 Atherosclerotic heart disease of native coronary artery without angina pectoris: Secondary | ICD-10-CM | POA: Diagnosis not present

## 2019-02-04 DIAGNOSIS — Z7952 Long term (current) use of systemic steroids: Secondary | ICD-10-CM | POA: Diagnosis not present

## 2019-02-04 DIAGNOSIS — I480 Paroxysmal atrial fibrillation: Secondary | ICD-10-CM | POA: Diagnosis not present

## 2019-02-04 DIAGNOSIS — I10 Essential (primary) hypertension: Secondary | ICD-10-CM | POA: Diagnosis not present

## 2019-02-04 DIAGNOSIS — Z955 Presence of coronary angioplasty implant and graft: Secondary | ICD-10-CM | POA: Diagnosis not present

## 2019-02-05 ENCOUNTER — Telehealth (INDEPENDENT_AMBULATORY_CARE_PROVIDER_SITE_OTHER): Payer: Self-pay

## 2019-02-05 DIAGNOSIS — I779 Disorder of arteries and arterioles, unspecified: Secondary | ICD-10-CM | POA: Diagnosis not present

## 2019-02-05 DIAGNOSIS — I6523 Occlusion and stenosis of bilateral carotid arteries: Secondary | ICD-10-CM | POA: Diagnosis not present

## 2019-02-05 NOTE — Telephone Encounter (Addendum)
Patient left a voicemail stating that she was not sure if she was going to keep her appointment for tomorrow because she is not sure if insurance will cover the visit.The patient will be returning a call back to the office to let us know if she will keep her schedule appointment.i spoke with the patient and she stated that she will not be able to keep appointment until she speak with her insurance and will call back in the morning.

## 2019-02-06 ENCOUNTER — Ambulatory Visit (INDEPENDENT_AMBULATORY_CARE_PROVIDER_SITE_OTHER): Payer: Medicare HMO | Admitting: Vascular Surgery

## 2019-02-06 ENCOUNTER — Other Ambulatory Visit: Payer: Self-pay | Admitting: Family Medicine

## 2019-02-06 DIAGNOSIS — Z4800 Encounter for change or removal of nonsurgical wound dressing: Secondary | ICD-10-CM | POA: Diagnosis not present

## 2019-02-06 DIAGNOSIS — I872 Venous insufficiency (chronic) (peripheral): Secondary | ICD-10-CM | POA: Diagnosis not present

## 2019-02-06 DIAGNOSIS — I480 Paroxysmal atrial fibrillation: Secondary | ICD-10-CM | POA: Diagnosis not present

## 2019-02-06 DIAGNOSIS — I2510 Atherosclerotic heart disease of native coronary artery without angina pectoris: Secondary | ICD-10-CM | POA: Diagnosis not present

## 2019-02-06 DIAGNOSIS — L97321 Non-pressure chronic ulcer of left ankle limited to breakdown of skin: Secondary | ICD-10-CM | POA: Diagnosis not present

## 2019-02-06 DIAGNOSIS — Z7952 Long term (current) use of systemic steroids: Secondary | ICD-10-CM | POA: Diagnosis not present

## 2019-02-06 DIAGNOSIS — Z7982 Long term (current) use of aspirin: Secondary | ICD-10-CM | POA: Diagnosis not present

## 2019-02-06 DIAGNOSIS — I10 Essential (primary) hypertension: Secondary | ICD-10-CM | POA: Diagnosis not present

## 2019-02-06 DIAGNOSIS — Z955 Presence of coronary angioplasty implant and graft: Secondary | ICD-10-CM | POA: Diagnosis not present

## 2019-02-07 ENCOUNTER — Telehealth (INDEPENDENT_AMBULATORY_CARE_PROVIDER_SITE_OTHER): Payer: Self-pay | Admitting: Vascular Surgery

## 2019-02-07 DIAGNOSIS — I480 Paroxysmal atrial fibrillation: Secondary | ICD-10-CM | POA: Diagnosis not present

## 2019-02-07 DIAGNOSIS — I10 Essential (primary) hypertension: Secondary | ICD-10-CM | POA: Diagnosis not present

## 2019-02-07 DIAGNOSIS — Z7952 Long term (current) use of systemic steroids: Secondary | ICD-10-CM | POA: Diagnosis not present

## 2019-02-07 DIAGNOSIS — Z4800 Encounter for change or removal of nonsurgical wound dressing: Secondary | ICD-10-CM | POA: Diagnosis not present

## 2019-02-07 DIAGNOSIS — Z7982 Long term (current) use of aspirin: Secondary | ICD-10-CM | POA: Diagnosis not present

## 2019-02-07 DIAGNOSIS — I872 Venous insufficiency (chronic) (peripheral): Secondary | ICD-10-CM | POA: Diagnosis not present

## 2019-02-07 DIAGNOSIS — L97321 Non-pressure chronic ulcer of left ankle limited to breakdown of skin: Secondary | ICD-10-CM | POA: Diagnosis not present

## 2019-02-07 DIAGNOSIS — Z955 Presence of coronary angioplasty implant and graft: Secondary | ICD-10-CM | POA: Diagnosis not present

## 2019-02-07 DIAGNOSIS — I2510 Atherosclerotic heart disease of native coronary artery without angina pectoris: Secondary | ICD-10-CM | POA: Diagnosis not present

## 2019-02-07 NOTE — Telephone Encounter (Signed)
Judeen Hammans with [facility] [phone]- calling stating that today 02/07/19 is her last visit with the patient. She stated she is going out today to do a unna wrap but that the patients wounds have healed and she has told the patient this several time and the patient will not take her advise. Per Judeen Hammans she has asked the patient numerous times to schedule apt to see [place]. Patient had an apt with [place] on 02/06/19 at 230 but canceled the apt because she is worried that insurance will not cover the visit. Judeen Hammans said she would not be back to cut off unna wraps. AS, CMA

## 2019-02-07 NOTE — Telephone Encounter (Signed)
Patient calling back and not very clear as to what she is needing. Patient finally stated she wanted to be seen. I had to transfer her to the front desk to ask about an appt to be seen by Dr. Delana Meyer.

## 2019-02-07 NOTE — Telephone Encounter (Signed)
Does the patient still have unna wraps on for longer than one week? If so, they need to be changed or taken off if the patient does not want to continue treatment. I would check to see if we accept the patients insurance. If we do, I would reach out to her to assure her we accept her insurance and strongly encouraged the patient to return to the office for removal of her unna wraps / to resume care. I can not force a patient to be complaint however please inform her and document she is fully aware of the risks of leaving her unna wraps in place (risk of infection, loss of tissue, ischemia, sepsis or/and death). I would also look for another home health agency.

## 2019-02-07 NOTE — Telephone Encounter (Signed)
I called Sherry-Nurse with [facility] to ask if patient had unna boot still in place. Judeen Hammans advised that yes patient did have boot on but that she would have nurse go out to patient house to remove stating that patient leg had no swelling and no wounds. I called [facility] and asked if patient could d/c wraps because home health nurse stating patient leg not swollen or having open wounds- [facility] said that was fine. I called Judeen Hammans back and advised to have home health nurse remove old wrap and d/c wrap. Judeen Hammans verbalized understanding and stated they were going to d/c patient from their care and did not want patient to be referred to them again. AS, CMA

## 2019-02-07 NOTE — Telephone Encounter (Signed)
Sherry with [facility] called our office back and left message stating that patient had a scheduled visit with her this morning at 11am at patient home. Patient called Judeen Hammans and stated she was going to be 10 min late. Sherry waited until 1130 and then left. Patient did not receive care from home health today.  Judeen Hammans stated she would be d/c patient due to non-compliance with keeping apts. AS, CMA

## 2019-02-11 ENCOUNTER — Telehealth: Payer: Self-pay | Admitting: Family Medicine

## 2019-02-11 NOTE — Telephone Encounter (Signed)
Apt 11:20 tomorrow.   Thanks,   -Mickel Baas

## 2019-02-11 NOTE — Telephone Encounter (Signed)
Pt called saying her right leg is now swollen.  She ask for an appt to see Dr. Caryn Section but there is nothing available.  Can anything be opened up or does she need to see another provider.  She can not do a virtual appt.  [phone]    Thanks Con Memos

## 2019-02-11 NOTE — Telephone Encounter (Signed)
There is an 8:20 tomorrow morning. If she can't make that then will need to see another provider.

## 2019-02-12 ENCOUNTER — Ambulatory Visit (INDEPENDENT_AMBULATORY_CARE_PROVIDER_SITE_OTHER): Payer: Medicare HMO | Admitting: Family Medicine

## 2019-02-12 ENCOUNTER — Encounter: Payer: Self-pay | Admitting: Family Medicine

## 2019-02-12 ENCOUNTER — Telehealth: Payer: Self-pay | Admitting: Family Medicine

## 2019-02-12 ENCOUNTER — Other Ambulatory Visit: Payer: Self-pay

## 2019-02-12 VITALS — BP 93/58 | HR 58 | Temp 97.7°F

## 2019-02-12 DIAGNOSIS — L03116 Cellulitis of left lower limb: Secondary | ICD-10-CM | POA: Diagnosis not present

## 2019-02-12 MED ORDER — LEVOFLOXACIN 500 MG PO TABS: 500.0000 mg | ORAL_TABLET | Freq: Every day | ORAL | 0 refills | Status: DC

## 2019-02-12 MED ORDER — CEFDINIR 300 MG PO CAPS: 600.0000 mg | ORAL_CAPSULE | Freq: Every day | ORAL | 0 refills | Status: AC

## 2019-02-12 NOTE — Patient Instructions (Signed)
.   Please review the attached list of medications and notify my office if there are any errors.   . Please bring all of your medications to every appointment so we can make sure that our medication list is the same as yours.   . We will have flu vaccines available after Labor Day. Please go to your pharmacy or call the office in early September to schedule you flu shot.   

## 2019-02-12 NOTE — Telephone Encounter (Signed)
Pharmacist Amy advised.

## 2019-02-12 NOTE — Telephone Encounter (Signed)
Can change to cefdinir, have sent new prescription to take instead of levofloxacin

## 2019-02-12 NOTE — Progress Notes (Signed)
Patient: Tina Salinas Female    DOB: 11/08/1945   73 y.o.   MRN: [id number] Visit Date: 02/12/2019  Today's Provider: Lelon Huh, MD   Chief Complaint  Patient presents with  . Edema    Bilateral; Left has been swelling for years but the right just recently started to swell.   Subjective:     HPI   Edema: Patient complains of edema. The location of the edema is lower leg(s) bilateral.  The edema has been moderate.  Onset of symptoms was several years ago, gradually worsening since that time. The edema is present all day. The patient states the problem is long-standing.  The swelling has been aggravated by nothing, relieved by diuretics, and been associated with nothing. Cardiac risk factors include advanced age (older than 71 for men, 52 for women). She does have sore  On left leg which she states has been present for years, but has been a little more red and tender lately.     Wt Readings from Last 3 Encounters:  01/20/19 91 lb (41.3 kg)  12/30/18 94 lb (42.6 kg)  12/02/18 95 lb (43.1 kg)     Allergies  Allergen Reactions  . Etodolac     Other reaction(s): Unknown  . Sulfa Antibiotics   . Tetracycline      Current Outpatient Medications:  .  acyclovir (ZOVIRAX) 800 MG tablet, Take 1 tablet (800 mg total) by mouth 3 (three) times daily as needed., Disp: 270 tablet, Rfl: 2 .  alendronate (FOSAMAX) 70 MG tablet, Take 70 mg by mouth once a week. , Disp: , Rfl:  .  ALPRAZolam (XANAX) 0.25 MG tablet, TAKE ONE TABLET BY MOUTH AT BEDTIME AS NEEDED FOR SLEEP, Disp: 90 tablet, Rfl: 1 .  amiodarone (PACERONE) 200 MG tablet, Take 200 mg by mouth daily., Disp: , Rfl:  .  aspirin 81 MG tablet, Take 81 mg by mouth daily., Disp: , Rfl:  .  cholecalciferol (VITAMIN D) 1000 UNITS tablet, Take 1,000 Units by mouth daily. , Disp: , Rfl:  .  collagenase (SANTYL) ointment, Apply 1 application topically daily., Disp: 90 g, Rfl: 3 .  cyproheptadine (PERIACTIN) 4 MG tablet, Take 1  tablet (4 mg total) by mouth 3 (three) times daily. For appetite, Disp: 270 tablet, Rfl: 3 .  fluconazole (DIFLUCAN) 150 MG tablet, TAKE ONE TABLET BY MOUTH ONCE FOR ONE DOSE, Disp: 1 tablet, Rfl: 0 .  fluticasone (FLONASE) 50 MCG/ACT nasal spray, Place 2 sprays into both nostrils daily., Disp: 16 g, Rfl: 6 .  furosemide (LASIX) 20 MG tablet, Take 1 tablet (20 mg total) by mouth daily., Disp: 30 tablet, Rfl: 1 .  HYDROcodone-acetaminophen (NORCO) 7.5-325 MG tablet, Take 2 tablets by mouth 3 (three) times daily as needed for moderate pain., Disp: 540 tablet, Rfl: 0 .  hydroxychloroquine (PLAQUENIL) 200 MG tablet, Take 400 mg by mouth daily. , Disp: , Rfl:  .  latanoprost (XALATAN) 0.005 % ophthalmic solution, Place 1 drop into both eyes at bedtime., Disp: , Rfl:  .  Methotrexate, PF, 25 MG/0.5ML SOAJ, Inject 1.4 mLs into the skin once a week., Disp: , Rfl:  .  Methylcellulose, Laxative, (GNP FIBER THERAPY PO), Take 1 Dose by mouth daily as needed (constipation). , Disp: , Rfl:  .  oxybutynin (DITROPAN) 5 MG tablet, TAKE 1 TABLET TWICE DAILY AS NEEDED  FOR  BLADDER  SPASMS, Disp: 180 tablet, Rfl: 4 .  pravastatin (PRAVACHOL) 40 MG tablet, Take  1 tablet (40 mg total) by mouth at bedtime., Disp: 30 tablet, Rfl: 0 .  predniSONE (DELTASONE) 2.5 MG tablet, Take 2.5 mg by mouth daily with breakfast., Disp: , Rfl:  .  carvedilol (COREG) 3.125 MG tablet, Take 1 tablet (3.125 mg total) by mouth 2 (two) times daily., Disp: 60 tablet, Rfl: 3  Review of Systems  Constitutional: Negative.   Respiratory: Negative for apnea, cough, choking, chest tightness, shortness of breath, wheezing and stridor.   Cardiovascular: Positive for leg swelling. Negative for chest pain and palpitations.  Gastrointestinal: Negative.   Neurological: Negative for dizziness, light-headedness and headaches.    Social History   Tobacco Use  . Smoking status: Never Smoker  . Smokeless tobacco: Never Used  Substance Use Topics  .  Alcohol use: No      Objective:   BP (!) 93/58 (BP Location: Right Arm, Patient Position: Sitting, Cuff Size: Normal)   Pulse (!) 58   Temp 97.7 F (36.5 C) (Oral)  Vitals:   02/12/19 1341  BP: (!) 93/58  Pulse: (!) 58  Temp: 97.7 F (36.5 C)  TempSrc: Oral     Physical Exam   General Appearance:    Alert, cooperative, no distress  Eyes:    PERRL, conjunctiva/corneas clear, EOM's intact       Lungs:     Clear to auscultation bilaterally, respirations unlabored  Heart:    Normal heart rate. Irregularly irregular rhythm. No murmurs, rubs, or gallops.   MS:   All extremities are intact.   Neurologic:   Awake, alert, oriented x 3. No apparent focal neurological           defect.   Skin:   Small area of faint erythema around erosion or anterior left lower leg.        Assessment & Plan    1. Cellulitis of left lower extremity Start levofloxacin 500 daily. Call if symptoms change or if not rapidly improving.       Lelon Huh, MD  [facility]

## 2019-02-12 NOTE — Telephone Encounter (Signed)
[facility] is calling regarding   levofloxacin (LEVAQUIN) 500 MG tablet is going to interact with amiodarone (PACERONE) 200 MG tablet that is prescribed by another doctor.  Wanting to know if Dr. Caryn Section wants to change the levofloxacin (LEVAQUIN) 500 MG tablet?  Please advise.  Thanks, [employer]

## 2019-02-20 ENCOUNTER — Telehealth: Payer: Self-pay

## 2019-02-20 NOTE — Telephone Encounter (Signed)
Patient is requesting that her physical be moved to July 09, 2019. Please contact patient in regards to this. She stated that she spoke to her insurance company and is demanding that it be moved to December before Dr. Caryn Section goes on vacation.

## 2019-02-21 NOTE — Telephone Encounter (Signed)
Last AWV was 07-15-2018. If she is sure her insurance will cover it on12-23-20 then schedule it.

## 2019-02-24 NOTE — Telephone Encounter (Signed)
Apt scheduled for 07/08/2019 starting at 2:20pm   Thanks,   -Mickel Baas

## 2019-03-04 ENCOUNTER — Telehealth: Payer: Self-pay | Admitting: Family Medicine

## 2019-03-04 NOTE — Telephone Encounter (Signed)
Pt needs a prescription for back pain.  [facility]  [facility]os

## 2019-03-05 DIAGNOSIS — M9903 Segmental and somatic dysfunction of lumbar region: Secondary | ICD-10-CM | POA: Diagnosis not present

## 2019-03-05 DIAGNOSIS — M9901 Segmental and somatic dysfunction of cervical region: Secondary | ICD-10-CM | POA: Diagnosis not present

## 2019-03-05 DIAGNOSIS — M542 Cervicalgia: Secondary | ICD-10-CM | POA: Diagnosis not present

## 2019-03-05 DIAGNOSIS — M436 Torticollis: Secondary | ICD-10-CM | POA: Diagnosis not present

## 2019-03-05 MED ORDER — CYCLOBENZAPRINE HCL 5 MG PO TABS: 5.0000 mg | ORAL_TABLET | Freq: Three times a day (TID) | ORAL | 1 refills | Status: DC | PRN

## 2019-03-05 NOTE — Telephone Encounter (Signed)
Have sent prescription for Flexeril to her pharmacy

## 2019-03-05 NOTE — Telephone Encounter (Signed)
Does she just need prescription for hydrocodone/apap. She was given printed prescription last month, but it doesn't look like it was filled.

## 2019-03-05 NOTE — Telephone Encounter (Signed)
Pt states she would like a muscle relaxer sent to [facility].  She states she "Twisted" her back and also has neck pain.   Thanks,   -Mickel Baas

## 2019-03-06 ENCOUNTER — Telehealth: Payer: Self-pay

## 2019-03-06 NOTE — Telephone Encounter (Signed)
Opened in error

## 2019-03-06 NOTE — Telephone Encounter (Signed)
Patient advised as below.  

## 2019-03-14 ENCOUNTER — Telehealth: Payer: Self-pay

## 2019-03-14 NOTE — Telephone Encounter (Signed)
Patient states that she spoke with pharmacist and was advised to try another otc cream. Patient states that she does not need a CMA to call her back.KW

## 2019-03-14 NOTE — Telephone Encounter (Signed)
FYI

## 2019-03-14 NOTE — Telephone Encounter (Signed)
Patient reports that she has had a rash on her skin for the past week, she states that she believes that they are insect bites and states that she has been using hydrocortisone with no relief. Patient describes skin as very itchy and and slightly raised. Patient denies any new detergent, soaps or perfume. Patient states that she has difficulty with transportation and does not have access to Internet. Patient reports that she uses [facility] as her pharmacy. KW

## 2019-03-18 ENCOUNTER — Ambulatory Visit: Payer: Medicare HMO | Admitting: Family Medicine

## 2019-03-25 ENCOUNTER — Other Ambulatory Visit: Payer: Self-pay | Admitting: Family Medicine

## 2019-03-25 DIAGNOSIS — G4700 Insomnia, unspecified: Secondary | ICD-10-CM

## 2019-03-25 NOTE — Telephone Encounter (Signed)
Patient is due for office visit, left message for patient to call back and arrange appt. KW

## 2019-03-31 ENCOUNTER — Ambulatory Visit: Payer: Self-pay | Admitting: Family Medicine

## 2019-04-03 ENCOUNTER — Ambulatory Visit (INDEPENDENT_AMBULATORY_CARE_PROVIDER_SITE_OTHER): Payer: Medicare HMO | Admitting: Vascular Surgery

## 2019-04-22 ENCOUNTER — Telehealth: Payer: Self-pay | Admitting: Family Medicine

## 2019-04-22 ENCOUNTER — Encounter: Payer: Self-pay | Admitting: Family Medicine

## 2019-04-22 ENCOUNTER — Other Ambulatory Visit: Payer: Self-pay

## 2019-04-22 ENCOUNTER — Ambulatory Visit (INDEPENDENT_AMBULATORY_CARE_PROVIDER_SITE_OTHER): Payer: Medicare HMO | Admitting: Family Medicine

## 2019-04-22 VITALS — BP 116/60 | HR 64 | Temp 96.9°F | Resp 16 | Ht 60.0 in | Wt 90.8 lb

## 2019-04-22 DIAGNOSIS — M05741 Rheumatoid arthritis with rheumatoid factor of right hand without organ or systems involvement: Secondary | ICD-10-CM | POA: Diagnosis not present

## 2019-04-22 DIAGNOSIS — M542 Cervicalgia: Secondary | ICD-10-CM

## 2019-04-22 DIAGNOSIS — Z23 Encounter for immunization: Secondary | ICD-10-CM

## 2019-04-22 DIAGNOSIS — R630 Anorexia: Secondary | ICD-10-CM

## 2019-04-22 DIAGNOSIS — Z86010 Personal history of colonic polyps: Secondary | ICD-10-CM | POA: Diagnosis not present

## 2019-04-22 MED ORDER — MIRTAZAPINE 15 MG PO TABS: 15.0000 mg | ORAL_TABLET | Freq: Every day | ORAL | 4 refills | Status: DC

## 2019-04-22 MED ORDER — CYCLOBENZAPRINE HCL 5 MG PO TABS: 5.0000 mg | ORAL_TABLET | Freq: Three times a day (TID) | ORAL | 1 refills | Status: DC | PRN

## 2019-04-22 MED ORDER — HYDROCODONE-ACETAMINOPHEN 7.5-325 MG PO TABS: 2.0000 | ORAL_TABLET | Freq: Three times a day (TID) | ORAL | 0 refills | Status: DC | PRN

## 2019-04-22 NOTE — Progress Notes (Signed)
Patient: Tina Salinas Female    DOB: [birth year]   73 y.o.   MRN: [id number] Visit Date: 04/22/2019  Today's Provider: Lelon Huh, MD   Chief Complaint  Patient presents with  . Follow-up   Subjective:     HPI  Follow up for Rheumatoid Arthritis:  The patient was last seen for this 3 months ago. Changes made at last visit include none; stable on Hydrocodone.  She reports good compliance with treatment. She feels that condition is Unchanged. She is not having side effects.   ------------------------------------------------------------------------------------   Follow up for Heart Failure:  The patient was last seen for this 3 months ago. Changes made at last visit include none; patient advised to continue regular follow up with Dr. Ubaldo Glassing.  She reports good compliance with treatment. She feels that condition is Unchanged. She is not having side effects.   ------------------------------------------------------------------------------------  Allergies  Allergen Reactions  . Etodolac     Other reaction(s): Unknown  . Sulfa Antibiotics   . Tetracycline      Current Outpatient Medications:  .  acyclovir (ZOVIRAX) 800 MG tablet, Take 1 tablet (800 mg total) by mouth 3 (three) times daily as needed., Disp: 270 tablet, Rfl: 2 .  alendronate (FOSAMAX) 70 MG tablet, Take 70 mg by mouth once a week. , Disp: , Rfl:  .  ALPRAZolam (XANAX) 0.25 MG tablet, TAKE ONE TABLET BY MOUTH AT BEDTIME AS NEEDED FOR SLEEP, Disp: 90 tablet, Rfl: 3 .  amiodarone (PACERONE) 200 MG tablet, Take 200 mg by mouth daily., Disp: , Rfl:  .  aspirin 81 MG tablet, Take 81 mg by mouth daily., Disp: , Rfl:  .  cholecalciferol (VITAMIN D) 1000 UNITS tablet, Take 1,000 Units by mouth daily. , Disp: , Rfl:  .  collagenase (SANTYL) ointment, Apply 1 application topically daily., Disp: 90 g, Rfl: 3 .  cyclobenzaprine (FLEXERIL) 5 MG tablet, Take 1 tablet (5 mg total) by mouth 3 (three) times daily  as needed for muscle spasms., Disp: 30 tablet, Rfl: 1 .  cyproheptadine (PERIACTIN) 4 MG tablet, Take 1 tablet (4 mg total) by mouth 3 (three) times daily. For appetite, Disp: 270 tablet, Rfl: 3 .  fluconazole (DIFLUCAN) 150 MG tablet, TAKE ONE TABLET BY MOUTH ONCE FOR ONE DOSE, Disp: 1 tablet, Rfl: 0 .  fluticasone (FLONASE) 50 MCG/ACT nasal spray, Place 2 sprays into both nostrils daily., Disp: 16 g, Rfl: 6 .  furosemide (LASIX) 20 MG tablet, Take 1 tablet (20 mg total) by mouth daily., Disp: 30 tablet, Rfl: 1 .  HYDROcodone-acetaminophen (NORCO) 7.5-325 MG tablet, Take 2 tablets by mouth 3 (three) times daily as needed for moderate pain., Disp: 540 tablet, Rfl: 0 .  hydroxychloroquine (PLAQUENIL) 200 MG tablet, Take 400 mg by mouth daily. , Disp: , Rfl:  .  latanoprost (XALATAN) 0.005 % ophthalmic solution, Place 1 drop into both eyes at bedtime., Disp: , Rfl:  .  Methotrexate, PF, 25 MG/0.5ML SOAJ, Inject 1.4 mLs into the skin once a week., Disp: , Rfl:  .  Methylcellulose, Laxative, (GNP FIBER THERAPY PO), Take 1 Dose by mouth daily as needed (constipation). , Disp: , Rfl:  .  oxybutynin (DITROPAN) 5 MG tablet, TAKE 1 TABLET TWICE DAILY AS NEEDED  FOR  BLADDER  SPASMS, Disp: 180 tablet, Rfl: 4 .  pravastatin (PRAVACHOL) 40 MG tablet, Take 1 tablet (40 mg total) by mouth at bedtime., Disp: 30 tablet, Rfl: 0 .  predniSONE (DELTASONE)  2.5 MG tablet, Take 2.5 mg by mouth daily with breakfast., Disp: , Rfl:  .  carvedilol (COREG) 3.125 MG tablet, Take 1 tablet (3.125 mg total) by mouth 2 (two) times daily., Disp: 60 tablet, Rfl: 3  Review of Systems  Constitutional: Negative for appetite change, chills, fatigue and fever.  Respiratory: Negative for chest tightness and shortness of breath.   Cardiovascular: Negative for chest pain and palpitations.  Gastrointestinal: Negative for abdominal pain, nausea and vomiting.  Musculoskeletal: Positive for arthralgias.  Neurological: Negative for dizziness  and weakness.    Social History   Tobacco Use  . Smoking status: Never Smoker  . Smokeless tobacco: Never Used  Substance Use Topics  . Alcohol use: No      Objective:   BP 116/60 (BP Location: Left Arm, Patient Position: Sitting, Cuff Size: Normal)   Pulse 64   Temp (!) 96.9 F (36.1 C) (Temporal)   Resp 16   Ht 5' (1.524 m)   Wt 90 lb 12.8 oz (41.2 kg)   SpO2 97% Comment: room air  BMI 17.73 kg/m  Vitals:   04/22/19 1124  BP: 116/60  Pulse: 64  Resp: 16  Temp: (!) 96.9 F (36.1 C)  TempSrc: Temporal  SpO2: 97%  Weight: 90 lb 12.8 oz (41.2 kg)  Height: 5' (1.524 m)  Body mass index is 17.73 kg/m.   Physical Exam   General Appearance:    Thin female in no acute distress  Eyes:    PERRL, conjunctiva/corneas clear, EOM's intact       Lungs:     Clear to auscultation bilaterally, respirations unlabored  Heart:    Normal heart rate. Regularly irregular rhythm. No murmurs, rubs, or gallops.   MS:   All extremities are intact.   Neurologic:   Awake, alert, oriented x 3. No apparent focal neurological           defect.          Assessment & Plan    1. 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  Dispense: 540 tablet; Refill: 0  2. History of adenomatous polyp of colon Has been over 5 years since last colonoscopy by Dr. Gustavo Lah - Ambulatory referral to Gastroenterology  3. Poor appetite She states peri-actin was too expensive, will try- mirtazapine (REMERON) 15 MG tablet; Take 1 tablet (15 mg total) by mouth at bedtime.  Dispense: 30 tablet; Refill: 4  4. Cervical pain refill- cyclobenzaprine (FLEXERIL) 5 MG tablet; Take 1 tablet (5 mg total) by mouth 3 (three) times daily as needed for muscle spasms.  Dispense: 60 tablet; Refill: 1  5. Need for  influenza vaccination  - Flu Vaccine QUAD High Dose(Fluad)  The entirety of the information documented in the History of Present Illness, Review of Systems and Physical Exam were personally obtained by me. Portions of this information were initially documented by Meyer Cory, CMA and reviewed by me for thoroughness and accuracy.      Lelon Huh, MD  [facility]

## 2019-04-22 NOTE — Telephone Encounter (Signed)
Patient called stating the Flexeril is not covered by her insurance. She has to pay $15.68 for a qty of 60 pills. Patient is willing to pay this amount but wants to know if Dr. Caryn Section will change the qty to 100 tablets? She says its only $1-$2 dollars more for a qty of 100 tablets. Patient says it doesn't  make any sense to pay $15 for 60 pills, when she can get 100 pills of 2 dollars more. Please advise.

## 2019-04-22 NOTE — Telephone Encounter (Signed)
Pt called saying [name] asked her to call her back regarding medication.  That is all she would say.  Con Memos

## 2019-04-22 NOTE — Patient Instructions (Addendum)
.   Please review the attached list of medications and notify my office if there are any errors.   . Please bring all of your medications to every appointment so we can make sure that our medication list is the same as yours.   . It is especially important to get the annual flu vaccine this year. If you haven't had it already, please go to your pharmacy or call the office as soon as possible to schedule you flu shot.   You should consider taking mirtazapine at night to help stimulate your appetite and put back on some weight.

## 2019-04-23 ENCOUNTER — Ambulatory Visit: Payer: Self-pay | Admitting: Family Medicine

## 2019-04-23 ENCOUNTER — Telehealth: Payer: Self-pay

## 2019-04-23 NOTE — Telephone Encounter (Signed)
Patient has been exposed to Covid and wants order to be tested.  I tried to make an appointment for the patient and 2 other people that were with her were all talking on the phone at the same time and they don't think she needs to talk to anyone.

## 2019-04-24 ENCOUNTER — Telehealth: Payer: Self-pay

## 2019-04-24 DIAGNOSIS — Z20828 Contact with and (suspected) exposure to other viral communicable diseases: Secondary | ICD-10-CM

## 2019-04-24 DIAGNOSIS — Z20822 Contact with and (suspected) exposure to covid-19: Secondary | ICD-10-CM

## 2019-04-24 NOTE — Telephone Encounter (Signed)
[name] talked with the patient about possible COVID exposure. The patient wants to call back tomorrow. Possible exposure to someone that may have been exposed to someone that may have had COVID.

## 2019-04-24 NOTE — Telephone Encounter (Signed)
This has already been handled.  Thank you

## 2019-04-24 NOTE — Telephone Encounter (Signed)
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 Patient called stating that she may have been exposed to [place]. She had a friend who came to help her around the house 2 days ago. Her friend found out that his dad tested positive for COVID this week. Patients friend has been tested for COVID and is waiting on their results. Patient is scared and wants to know if she needs to be tested for COVID. I advised her that she should wait to see if her friends results are positive, and if they are she would need to be tested also. Patient plans on calling back when she finds out her friends results. Please advise if you have any different recommendations. Patient is asymptomatic.

## 2019-04-24 NOTE — Telephone Encounter (Signed)
Please review for Dr. Caryn Section.  I tried calling pt but her mailbox is full.  Will try again later.   Thanks,   -Mickel Baas

## 2019-04-25 ENCOUNTER — Other Ambulatory Visit: Payer: Self-pay

## 2019-04-25 DIAGNOSIS — Z20822 Contact with and (suspected) exposure to covid-19: Secondary | ICD-10-CM

## 2019-04-25 NOTE — Telephone Encounter (Signed)
Opened in error

## 2019-04-25 NOTE — Telephone Encounter (Signed)
Patient called back this morning reporting that her friends COVID test result came back positive. Patient is currently asymptomatic. I placed orders for patient to be tested and gave her instructions on where to go for testing. I also advised patient to self quarantine until results came back. Patient verbally voiced understanding.

## 2019-04-26 LAB — NOVEL CORONAVIRUS, NAA: SARS-CoV-2, NAA: NOT DETECTED

## 2019-04-28 ENCOUNTER — Telehealth: Payer: Self-pay

## 2019-04-28 NOTE — Telephone Encounter (Signed)
Patient is requesting [name] to call her back please

## 2019-04-28 NOTE — Telephone Encounter (Signed)
Tried calling patient back. No answer on home phone , and voicemail box full on cell phone.

## 2019-04-29 ENCOUNTER — Telehealth: Payer: Self-pay | Admitting: Family Medicine

## 2019-04-29 NOTE — Telephone Encounter (Signed)
Pt retured missed call.  She asks if they can let it ring longer to fiver he time to get to the phone when calling her back.  Thanks, [employer]

## 2019-04-29 NOTE — Telephone Encounter (Signed)
Pt called asking for [name].  She said she cleared her voice mail box.Con Memos

## 2019-04-29 NOTE — Telephone Encounter (Signed)
Tried calling patient. No answer on home phone. Left message on cell phone to call back. If patient calls back, please get more information as to what patient needs. I will be with a different provider tomorrow.

## 2019-05-12 ENCOUNTER — Telehealth: Payer: Self-pay

## 2019-05-12 DIAGNOSIS — H40153 Residual stage of open-angle glaucoma, bilateral: Secondary | ICD-10-CM | POA: Diagnosis not present

## 2019-05-12 NOTE — Telephone Encounter (Signed)
Please advise if patient needs to be retested for COVID, or if she needs to just  quarantine for 14 days.

## 2019-05-12 NOTE — Telephone Encounter (Signed)
Pt is requesting Linus Salmons return her call. Pt stated that yesterday someone came to visit her and today they told her they went to the doctor today and was advised they had Covid and pneumonia. Pt stated that she doesn't have symptoms but thinks she needs to be tested again but wants to talk to [facility]. Please advise. Thanks TNP

## 2019-05-12 NOTE — Telephone Encounter (Signed)
It's up to her, but even if she gets Covid, the test won't turn positive for 5-10 after exposure. If she does get tested she should wait until Friday, or she can just quarantine for 10 days. If she develops any sx in 5-10 days then she should definitely get tested.

## 2019-05-13 ENCOUNTER — Other Ambulatory Visit: Payer: Self-pay

## 2019-05-13 DIAGNOSIS — Z20828 Contact with and (suspected) exposure to other viral communicable diseases: Secondary | ICD-10-CM | POA: Diagnosis not present

## 2019-05-13 DIAGNOSIS — Z20822 Contact with and (suspected) exposure to covid-19: Secondary | ICD-10-CM

## 2019-05-13 NOTE — Telephone Encounter (Signed)
Tried calling patient. Left message to call back. 

## 2019-05-15 ENCOUNTER — Telehealth: Payer: Self-pay | Admitting: General Practice

## 2019-05-15 LAB — NOVEL CORONAVIRUS, NAA: SARS-CoV-2, NAA: NOT DETECTED

## 2019-05-15 NOTE — Telephone Encounter (Signed)
Patient informed of negative covid result. She verbalized understanding.

## 2019-05-20 DIAGNOSIS — Z86010 Personal history of colonic polyps: Secondary | ICD-10-CM | POA: Diagnosis not present

## 2019-05-20 DIAGNOSIS — R634 Abnormal weight loss: Secondary | ICD-10-CM | POA: Diagnosis not present

## 2019-05-20 DIAGNOSIS — R1013 Epigastric pain: Secondary | ICD-10-CM | POA: Diagnosis not present

## 2019-06-05 ENCOUNTER — Telehealth: Payer: Self-pay | Admitting: Family Medicine

## 2019-06-05 NOTE — Telephone Encounter (Signed)
Pt is requesting rx for something to increase her appetite. She stated she weights 75 lbs. Pt also stated she is having an upper GI appt in 3 weeks as well. Please advise.

## 2019-06-05 NOTE — Telephone Encounter (Signed)
From [facility] 

## 2019-06-06 ENCOUNTER — Other Ambulatory Visit: Payer: Self-pay

## 2019-06-06 DIAGNOSIS — Z20822 Contact with and (suspected) exposure to covid-19: Secondary | ICD-10-CM

## 2019-06-06 MED ORDER — MIRTAZAPINE 30 MG PO TABS: 30.0000 mg | ORAL_TABLET | Freq: Every day | ORAL | 2 refills | Status: AC

## 2019-06-06 NOTE — Telephone Encounter (Signed)
Patient advised. RX sent to [facility] pharmacy.

## 2019-06-06 NOTE — Telephone Encounter (Signed)
Recommend she increase mirtazapine to 30mg  once daily at bedtime, #30, rf x 2 to help more stimulate appetite

## 2019-06-09 ENCOUNTER — Telehealth: Payer: Self-pay

## 2019-06-09 LAB — NOVEL CORONAVIRUS, NAA: SARS-CoV-2, NAA: NOT DETECTED

## 2019-06-09 NOTE — Telephone Encounter (Signed)
Patient given negative result and verbalized understanding  

## 2019-06-10 ENCOUNTER — Telehealth: Payer: Self-pay

## 2019-06-10 DIAGNOSIS — M05749 Rheumatoid arthritis with rheumatoid factor of unspecified hand without organ or systems involvement: Secondary | ICD-10-CM

## 2019-06-10 DIAGNOSIS — G809 Cerebral palsy, unspecified: Secondary | ICD-10-CM

## 2019-06-10 NOTE — Telephone Encounter (Signed)
Copied from [place] [id number]. Topic: General - Call Back - No Documentation >> Jun 10, 2019 10:28 AM Erick Blinks wrote: Reason for CRM: Pt called requesting a call back from Nurse Best contact: [phone]

## 2019-06-10 NOTE — Telephone Encounter (Signed)
Patient is requesting that Dr. Caryn Section place an order for home Physical therapy for weakness. Please advise.

## 2019-06-16 ENCOUNTER — Other Ambulatory Visit: Payer: Self-pay

## 2019-06-16 ENCOUNTER — Other Ambulatory Visit
Admission: RE | Admit: 2019-06-16 | Discharge: 2019-06-16 | Disposition: A | Discharge status: Home / Self Care | Payer: Medicare HMO | Source: Ambulatory Visit | Attending: General Surgery | Admitting: General Surgery

## 2019-06-16 DIAGNOSIS — Z01812 Encounter for preprocedural laboratory examination: Secondary | ICD-10-CM | POA: Insufficient documentation

## 2019-06-16 DIAGNOSIS — Z20828 Contact with and (suspected) exposure to other viral communicable diseases: Secondary | ICD-10-CM | POA: Insufficient documentation

## 2019-06-16 LAB — SARS CORONAVIRUS 2 (TAT 6-24 HRS): SARS Coronavirus 2: NEGATIVE

## 2019-06-17 ENCOUNTER — Encounter: Payer: Self-pay | Admitting: *Deleted

## 2019-06-18 ENCOUNTER — Ambulatory Visit: Payer: Medicare HMO | Admitting: Certified Registered Nurse Anesthetist

## 2019-06-18 ENCOUNTER — Ambulatory Visit
Admission: RE | Admit: 2019-06-18 | Discharge: 2019-06-18 | Disposition: A | Discharge status: Home / Self Care | Payer: Medicare HMO | Attending: General Surgery | Admitting: General Surgery

## 2019-06-18 ENCOUNTER — Encounter
Admission: RE | Disposition: A | Discharge status: Home / Self Care | Payer: Self-pay | Source: Home / Self Care | Attending: General Surgery

## 2019-06-18 ENCOUNTER — Other Ambulatory Visit: Payer: Self-pay

## 2019-06-18 DIAGNOSIS — Z7982 Long term (current) use of aspirin: Secondary | ICD-10-CM | POA: Insufficient documentation

## 2019-06-18 DIAGNOSIS — D123 Benign neoplasm of transverse colon: Secondary | ICD-10-CM | POA: Insufficient documentation

## 2019-06-18 DIAGNOSIS — R634 Abnormal weight loss: Secondary | ICD-10-CM | POA: Insufficient documentation

## 2019-06-18 DIAGNOSIS — Z96649 Presence of unspecified artificial hip joint: Secondary | ICD-10-CM | POA: Insufficient documentation

## 2019-06-18 DIAGNOSIS — N183 Chronic kidney disease, stage 3 unspecified: Secondary | ICD-10-CM | POA: Diagnosis not present

## 2019-06-18 DIAGNOSIS — Z881 Allergy status to other antibiotic agents status: Secondary | ICD-10-CM | POA: Diagnosis not present

## 2019-06-18 DIAGNOSIS — I509 Heart failure, unspecified: Secondary | ICD-10-CM | POA: Insufficient documentation

## 2019-06-18 DIAGNOSIS — I110 Hypertensive heart disease with heart failure: Secondary | ICD-10-CM | POA: Diagnosis not present

## 2019-06-18 DIAGNOSIS — I2510 Atherosclerotic heart disease of native coronary artery without angina pectoris: Secondary | ICD-10-CM | POA: Diagnosis not present

## 2019-06-18 DIAGNOSIS — K317 Polyp of stomach and duodenum: Secondary | ICD-10-CM | POA: Diagnosis not present

## 2019-06-18 DIAGNOSIS — Z96652 Presence of left artificial knee joint: Secondary | ICD-10-CM | POA: Diagnosis not present

## 2019-06-18 DIAGNOSIS — M069 Rheumatoid arthritis, unspecified: Secondary | ICD-10-CM | POA: Diagnosis not present

## 2019-06-18 DIAGNOSIS — Z882 Allergy status to sulfonamides status: Secondary | ICD-10-CM | POA: Insufficient documentation

## 2019-06-18 DIAGNOSIS — D120 Benign neoplasm of cecum: Secondary | ICD-10-CM | POA: Diagnosis not present

## 2019-06-18 DIAGNOSIS — I252 Old myocardial infarction: Secondary | ICD-10-CM | POA: Diagnosis not present

## 2019-06-18 DIAGNOSIS — Z86010 Personal history of colonic polyps: Secondary | ICD-10-CM | POA: Diagnosis not present

## 2019-06-18 DIAGNOSIS — Z955 Presence of coronary angioplasty implant and graft: Secondary | ICD-10-CM | POA: Diagnosis not present

## 2019-06-18 DIAGNOSIS — Z1211 Encounter for screening for malignant neoplasm of colon: Secondary | ICD-10-CM | POA: Insufficient documentation

## 2019-06-18 DIAGNOSIS — R1013 Epigastric pain: Secondary | ICD-10-CM | POA: Diagnosis not present

## 2019-06-18 DIAGNOSIS — I5043 Acute on chronic combined systolic (congestive) and diastolic (congestive) heart failure: Secondary | ICD-10-CM | POA: Diagnosis not present

## 2019-06-18 DIAGNOSIS — G809 Cerebral palsy, unspecified: Secondary | ICD-10-CM | POA: Insufficient documentation

## 2019-06-18 DIAGNOSIS — Z79899 Other long term (current) drug therapy: Secondary | ICD-10-CM | POA: Diagnosis not present

## 2019-06-18 DIAGNOSIS — I4891 Unspecified atrial fibrillation: Secondary | ICD-10-CM | POA: Diagnosis not present

## 2019-06-18 DIAGNOSIS — I130 Hypertensive heart and chronic kidney disease with heart failure and stage 1 through stage 4 chronic kidney disease, or unspecified chronic kidney disease: Secondary | ICD-10-CM | POA: Diagnosis not present

## 2019-06-18 DIAGNOSIS — K5790 Diverticulosis of intestine, part unspecified, without perforation or abscess without bleeding: Secondary | ICD-10-CM | POA: Diagnosis not present

## 2019-06-18 DIAGNOSIS — K635 Polyp of colon: Secondary | ICD-10-CM | POA: Diagnosis not present

## 2019-06-18 HISTORY — DX: Unspecified fracture of unspecified femur, initial encounter for closed fracture: S72.90XA

## 2019-06-18 HISTORY — DX: Benign neoplasm, unspecified site: D36.9

## 2019-06-18 HISTORY — DX: Unspecified osteoarthritis, unspecified site: M19.90

## 2019-06-18 HISTORY — PX: COLONOSCOPY WITH PROPOFOL: SHX5780

## 2019-06-18 HISTORY — DX: Rheumatoid arthritis, unspecified: M06.9

## 2019-06-18 HISTORY — DX: Cerebral palsy, unspecified: G80.9

## 2019-06-18 HISTORY — DX: Personal history of other medical treatment: Z92.89

## 2019-06-18 HISTORY — PX: ESOPHAGOGASTRODUODENOSCOPY (EGD) WITH PROPOFOL: SHX5813

## 2019-06-18 HISTORY — DX: Age-related osteoporosis without current pathological fracture: M81.0

## 2019-06-18 HISTORY — DX: Essential (primary) hypertension: I10

## 2019-06-18 SURGERY — COLONOSCOPY WITH PROPOFOL
Anesthesia: General

## 2019-06-18 MED ORDER — SODIUM CHLORIDE 0.9 % IV SOLN
INTRAVENOUS | Status: DC
  Administered 2019-06-18: 10:00:00 via INTRAVENOUS

## 2019-06-18 MED ORDER — GLYCOPYRROLATE 0.2 MG/ML IJ SOLN
INTRAMUSCULAR | Status: DC | PRN
  Administered 2019-06-18: 0.1 mg via INTRAVENOUS

## 2019-06-18 MED ORDER — PROPOFOL 10 MG/ML IV BOLUS
INTRAVENOUS | Status: DC | PRN
  Administered 2019-06-18: 30 mg via INTRAVENOUS

## 2019-06-18 MED ORDER — EPHEDRINE SULFATE 50 MG/ML IJ SOLN
INTRAMUSCULAR | Status: DC | PRN
  Administered 2019-06-18 (×2): 10 mg via INTRAVENOUS
  Administered 2019-06-18 (×3): 5 mg via INTRAVENOUS

## 2019-06-18 MED ORDER — LIDOCAINE HCL (CARDIAC) PF 100 MG/5ML IV SOSY
PREFILLED_SYRINGE | INTRAVENOUS | Status: DC | PRN
  Administered 2019-06-18: 60 mg via INTRAVENOUS

## 2019-06-18 MED ORDER — PROPOFOL 500 MG/50ML IV EMUL
INTRAVENOUS | Status: DC | PRN
  Administered 2019-06-18: 160 ug/kg/min via INTRAVENOUS

## 2019-06-18 NOTE — Anesthesia Preprocedure Evaluation (Signed)
Anesthesia Evaluation  Patient identified by MRN, date of birth, ID band Patient awake    Reviewed: Allergy & Precautions, NPO status , Patient's Chart, lab work & pertinent test results  History of Anesthesia Complications Negative for: history of anesthetic complications  Airway Mallampati: II       Dental   Pulmonary neg sleep apnea, neg COPD, Not current smoker,           Cardiovascular hypertension, + CAD, + Past MI, + Cardiac Stents and +CHF       Neuro/Psych neg Seizures    GI/Hepatic GERD  ,  Endo/Other  neg diabetes  Renal/GU Renal InsufficiencyRenal disease     Musculoskeletal   Abdominal   Peds  Hematology   Anesthesia Other Findings   Reproductive/Obstetrics                             Anesthesia Physical Anesthesia Plan  ASA: III  Anesthesia Plan: General   Post-op Pain Management:    Induction: Intravenous  PONV Risk Score and Plan: 3 and Propofol infusion, TIVA and Treatment may vary due to age or medical condition  Airway Management Planned: Nasal Cannula  Additional Equipment:   Intra-op Plan:   Post-operative Plan:   Informed Consent: I have reviewed the patients History and Physical, chart, labs and discussed the procedure including the risks, benefits and alternatives for the proposed anesthesia with the patient or authorized representative who has indicated his/her understanding and acceptance.       Plan Discussed with:   Anesthesia Plan Comments:         Anesthesia Quick Evaluation

## 2019-06-18 NOTE — Transfer of Care (Signed)
Immediate Anesthesia Transfer of Care Note  Patient: Tina Salinas  Procedure(s) Performed: COLONOSCOPY WITH PROPOFOL (N/A ) ESOPHAGOGASTRODUODENOSCOPY (EGD) WITH PROPOFOL (N/A )  Patient Location: PACU  Anesthesia Type:General  Level of Consciousness: drowsy  Airway & Oxygen Therapy: Patient Spontanous Breathing and Patient connected to nasal cannula oxygen  Post-op Assessment: Report given to RN and Post -op Vital signs reviewed and stable  Post vital signs: Reviewed and stable  Last Vitals:  Vitals Value Taken Time  BP 87/43 06/18/19 1135  Temp 35.6 C 06/18/19 1134  Pulse 64 06/18/19 1136  Resp 17 06/18/19 1136  SpO2 99 % 06/18/19 1136  Vitals shown include unvalidated device data.  Last Pain:  Vitals:   06/18/19 1134  TempSrc: Temporal  PainSc: 0-No pain         Complications: No apparent anesthesia complications

## 2019-06-18 NOTE — Anesthesia Procedure Notes (Signed)
Performed by: Daylan Boggess, CRNA Pre-anesthesia Checklist: Patient identified, Emergency Drugs available, Suction available, Patient being monitored and Timeout performed Patient Re-evaluated:Patient Re-evaluated prior to induction Oxygen Delivery Method: Nasal cannula Induction Type: IV induction       

## 2019-06-18 NOTE — Anesthesia Post-op Follow-up Note (Signed)
Anesthesia QCDR form completed.        

## 2019-06-18 NOTE — Op Note (Signed)
[facility] Patient Name: Tina Salinas Procedure Date: 06/18/2019 10:41 AM MRN: [id number] Account #: [id number] Date of Birth: 19-Dec-1945 Admit Type: Outpatient Age: 73 Room: [facility] ENDO ROOM 1 Gender: Female Note Status: Finalized Procedure:             Upper GI endoscopy Indications:           Dyspepsia, Weight loss Providers:             Robert Bellow, MD Referring MD:          Kirstie Peri. Caryn Section, MD (Referring MD) Medicines:             Monitored Anesthesia Care Complications:         No immediate complications. Procedure:             Pre-Anesthesia Assessment:                        - Prior to the procedure, a History and Physical was                         performed, and patient medications, allergies and                         sensitivities were reviewed. The patient's tolerance                         of previous anesthesia was reviewed.                        - The risks and benefits of the procedure and the                         sedation options and risks were discussed with the                         patient. All questions were answered and informed                         consent was obtained.                        After obtaining informed consent, the endoscope was                         passed under direct vision. Throughout the procedure,                         the patient's blood pressure, pulse, and oxygen                         saturations were monitored continuously. The Endoscope                         was introduced through the mouth, and advanced to the                         second part of duodenum. The upper GI endoscopy was  accomplished without difficulty. The patient tolerated                         the procedure well. Findings:      The esophagus was normal.      The examined duodenum was normal.      Two 4 mm sessile polyps with no bleeding and no stigmata of recent       bleeding  were found on the posterior wall of the gastric body. Biopsies       were taken with a cold forceps for histology. Impression:            - Normal esophagus.                        - Normal examined duodenum.                        - Two gastric polyps. Biopsied. Recommendation:        - Perform a colonoscopy today. Procedure Code(s):     --- Professional ---                        [phone], Esophagogastroduodenoscopy, flexible,                         transoral; with biopsy, single or multiple Diagnosis Code(s):     --- Professional ---                        K31.7, Polyp of stomach and duodenum                        R10.13, Epigastric pain                        R63.4, Abnormal weight loss CPT copyright 2019 American Medical Association. All rights reserved. The codes documented in this report are preliminary and upon coder review may  be revised to meet current compliance requirements. Robert Bellow, MD 06/18/2019 11:01:59 AM This report has been signed electronically. Number of Addenda: 0 Note Initiated On: 06/18/2019 10:41 AM Estimated Blood Loss:  Estimated blood loss: none.      [facility]

## 2019-06-18 NOTE — Anesthesia Postprocedure Evaluation (Signed)
Anesthesia Post Note  Patient: TALONDA KREISCHER  Procedure(s) Performed: COLONOSCOPY WITH PROPOFOL (N/A ) ESOPHAGOGASTRODUODENOSCOPY (EGD) WITH PROPOFOL (N/A )  Patient location during evaluation: Endoscopy Anesthesia Type: General Level of consciousness: awake and alert and oriented Pain management: pain level controlled Vital Signs Assessment: post-procedure vital signs reviewed and stable Respiratory status: spontaneous breathing, nonlabored ventilation and respiratory function stable Cardiovascular status: blood pressure returned to baseline and stable Postop Assessment: no signs of nausea or vomiting Anesthetic complications: no     Last Vitals:  Vitals:   06/18/19 1154 06/18/19 1204  BP: (!) 89/47 105/63  Pulse: 62 61  Resp: 16 17  Temp:    SpO2: 100% 100%    Last Pain:  Vitals:   06/18/19 1154  TempSrc:   PainSc: 0-No pain                 Amybeth Sieg

## 2019-06-18 NOTE — H&P (Signed)
Tina Salinas [id number] 10-03-1945      HPI:   73 y/o woman with a history of six tubular adenomas in 2014 who missed her 3 year follow up exam.  One year history of weight loss which she associates with her 2019 MI/ stent.  Living with outers who cooked "different" than she liked. Denies pain with meals.  Tolerated most of the prep well. Last BM water (yellow).  Denies anti platelets.   Medications Prior to Admission  Medication Sig Dispense Refill Last Dose  . acyclovir (ZOVIRAX) 800 MG tablet Take 1 tablet (800 mg total) by mouth 3 (three) times daily as needed. 270 tablet 2 Past Week at Unknown time  . alendronate (FOSAMAX) 70 MG tablet Take 70 mg by mouth once a week.    Past Week at Unknown time  . ALPRAZolam (XANAX) 0.25 MG tablet TAKE ONE TABLET BY MOUTH AT BEDTIME AS NEEDED FOR SLEEP 90 tablet 3 Past Week at Unknown time  . amiodarone (PACERONE) 200 MG tablet Take 200 mg by mouth daily.   06/18/2019 at Unknown time  . aspirin 81 MG tablet Take 81 mg by mouth daily.   Past Week at Unknown time  . Calcium Carbonate Antacid (CALCIUM CARBONATE PO) Take by mouth daily.   Past Week at Unknown time  . cholecalciferol (VITAMIN D) 1000 UNITS tablet Take 1,000 Units by mouth daily.    Past Week at Unknown time  . collagenase (SANTYL) ointment Apply 1 application topically daily. 90 g 3 Past Week at Unknown time  . cyclobenzaprine (FLEXERIL) 5 MG tablet Take 1 tablet (5 mg total) by mouth 3 (three) times daily as needed for muscle spasms. 60 tablet 1 Past Week at Unknown time  . cyproheptadine (PERIACTIN) 4 MG tablet Take 4 mg by mouth 3 (three) times daily as needed for allergies.   Past Week at Unknown time  . fluconazole (DIFLUCAN) 150 MG tablet TAKE ONE TABLET BY MOUTH ONCE FOR ONE DOSE 1 tablet 0 Past Week at Unknown time  . fluticasone (FLONASE) 50 MCG/ACT nasal spray Place 2 sprays into both nostrils daily. 16 g 6 Past Week at Unknown time  . folic acid (FOLVITE) 1 MG tablet Take 1 mg by  mouth daily.   Past Week at Unknown time  . furosemide (LASIX) 20 MG tablet Take 1 tablet (20 mg total) by mouth daily. 30 tablet 1 Past Week at Unknown time  . HYDROcodone-acetaminophen (NORCO) 7.5-325 MG tablet Take 2 tablets by mouth 3 (three) times daily as needed for moderate pain. 540 tablet 0 Past Week at Unknown time  . hydroxychloroquine (PLAQUENIL) 200 MG tablet Take 400 mg by mouth daily.    Past Week at Unknown time  . ibuprofen (ADVIL) 800 MG tablet Take 800 mg by mouth every 8 (eight) hours as needed.   Past Week at Unknown time  . latanoprost (XALATAN) 0.005 % ophthalmic solution Place 1 drop into both eyes at bedtime.   Past Week at Unknown time  . Methotrexate, PF, 25 MG/0.5ML SOAJ Inject 1.4 mLs into the skin once a week.   Past Week at Unknown time  . Methylcellulose, Laxative, (GNP FIBER THERAPY PO) Take 1 Dose by mouth daily as needed (constipation).    Past Week at Unknown time  . mirtazapine (REMERON) 30 MG tablet Take 1 tablet (30 mg total) by mouth at bedtime. 30 tablet 2 Past Week at Unknown time  . oxybutynin (DITROPAN) 5 MG tablet TAKE 1 TABLET TWICE DAILY  AS NEEDED  FOR  BLADDER  SPASMS 180 tablet 4 Past Week at Unknown time  . pravastatin (PRAVACHOL) 40 MG tablet Take 1 tablet (40 mg total) by mouth at bedtime. 30 tablet 0 Past Week at Unknown time  . predniSONE (DELTASONE) 2.5 MG tablet Take 2.5 mg by mouth daily with breakfast.   Past Week at Unknown time  . promethazine (PHENERGAN) 25 MG tablet Take 25 mg by mouth every 6 (six) hours as needed for nausea or vomiting.   Past Week at Unknown time  . carvedilol (COREG) 3.125 MG tablet Take 1 tablet (3.125 mg total) by mouth 2 (two) times daily. 60 tablet 3    Allergies  Allergen Reactions  . Etodolac     Other reaction(s): Unknown  . Sulfa Antibiotics   . Tetracycline    Past Medical History:  Diagnosis Date  . Adenoma   . CAP (community acquired pneumonia) 11/16/2017  . Cerebral palsy ([place])   . Femur fracture  ([place])   . History of blood transfusion   . Hypertension   . Osteoarthritis   . Osteoporosis   . Rheumatoid arthritis [facility])    Past Surgical History:  Procedure Laterality Date  . BREAST ENHANCEMENT SURGERY Bilateral 1980  . COLONOSCOPY    . CORONARY ANGIOPLASTY WITH STENT PLACEMENT  07/13/2008, 08/19/2008  . HIP ARTHROPLASTY N/A 2000  . JOINT REPLACEMENT    . LEFT HEART CATH AND CORONARY ANGIOGRAPHY N/A 05/16/2017   Procedure: LEFT HEART CATH AND CORONARY ANGIOGRAPHY;  Surgeon: Teodoro Spray, MD;  Location: [facility];  Service: Cardiovascular;  Laterality: N/A;  . TOTAL KNEE ARTHROPLASTY Left 09/2007, 01/2008   Social History   Socioeconomic History  . Marital status: Widowed    Spouse name: Not on file  . Number of children: Not on file  . Years of education: Not on file  . Highest education level: Not on file  Occupational History  . Not on file  Social Needs  . Financial resource strain: Not on file  . Food insecurity    Worry: Not on file    Inability: Not on file  . Transportation needs    Medical: Not on file    Non-medical: Not on file  Tobacco Use  . Smoking status: Never Smoker  . Smokeless tobacco: Never Used  Substance and Sexual Activity  . Alcohol use: No  . Drug use: No  . Sexual activity: Not on file  Lifestyle  . Physical activity    Days per week: Not on file    Minutes per session: Not on file  . Stress: Not on file  Relationships  . Social Herbalist on phone: Not on file    Gets together: Not on file    Attends religious service: Not on file    Active member of club or organization: Not on file    Attends meetings of clubs or organizations: Not on file    Relationship status: Not on file  . Intimate partner violence    Fear of current or ex partner: Not on file    Emotionally abused: Not on file    Physically abused: Not on file    Forced sexual activity: Not on file  Other Topics Concern  . Not on file  Social  History Narrative  . Not on file   Social History   Social History Narrative  . Not on file     ROS: Negative.  PE: HEENT: Negative. Lungs: Clear. Cardio: RR.  Assessment/Plan:  Proceed with planned upper and lower endoscopy. Forest Gleason Cledith Abdou 06/18/2019

## 2019-06-18 NOTE — Op Note (Signed)
[facility] Patient Name: Tina Salinas Procedure Date: 06/18/2019 10:41 AM MRN: [id number] Account #: [id number] Date of Birth: 07-04-1946 Admit Type: Outpatient Age: 73 Room: [facility] ENDO ROOM 1 Gender: Female Note Status: Finalized Procedure:             Colonoscopy Indications:           High risk colon cancer surveillance: Personal history                         of colonic polyps, Incidental - Weight loss Providers:             Robert Bellow, MD Referring MD:          Kirstie Peri. Caryn Section, MD (Referring MD) Medicines:             Monitored Anesthesia Care Complications:         No immediate complications. Procedure:             Pre-Anesthesia Assessment:                        - Prior to the procedure, a History and Physical was                         performed, and patient medications, allergies and                         sensitivities were reviewed. The patient's tolerance                         of previous anesthesia was reviewed.                        - The risks and benefits of the procedure and the                         sedation options and risks were discussed with the                         patient. All questions were answered and informed                         consent was obtained.                        After obtaining informed consent, the colonoscope was                         passed under direct vision. Throughout the procedure,                         the patient's blood pressure, pulse, and oxygen                         saturations were monitored continuously. The                         Colonoscope was introduced through the anus and                         advanced  to the the terminal ileum. The colonoscopy                         was performed without difficulty. The patient                         tolerated the procedure well. The quality of the bowel                         preparation was excellent. Findings:   A 5 mm polyp was found in the transverse colon. The polyp was sessile.       Biopsies were taken with a cold forceps for histology.      Two sessile polyps were found in the cecum. The polyps were 5 mm in       size. These were biopsied with a cold forceps for histology.      The retroflexed view of the distal rectum and anal verge was normal and       showed no anal or rectal abnormalities. Impression:            - One 5 mm polyp in the transverse colon. Biopsied.                        - Two 5 mm polyps in the cecum. Biopsied.                        - The distal rectum and anal verge are normal on                         retroflexion view. Recommendation:        - Telephone endoscopist for pathology results in 1                         week. Procedure Code(s):     --- Professional ---                        [id number], Colonoscopy, flexible; with biopsy, single or                         multiple Diagnosis Code(s):     --- Professional ---                        K63.5, Polyp of colon                        Z86.010, Personal history of colonic polyps CPT copyright 2019 American Medical Association. All rights reserved. The codes documented in this report are preliminary and upon coder review may  be revised to meet current compliance requirements. Robert Bellow, MD 06/18/2019 11:35:11 AM This report has been signed electronically. Number of Addenda: 0 Note Initiated On: 06/18/2019 10:41 AM Scope Withdrawal Time: 0 hours 14 minutes 17 seconds  Total Procedure Duration: 0 hours 24 minutes 51 seconds       [facility]

## 2019-06-19 ENCOUNTER — Encounter: Payer: Self-pay | Admitting: General Surgery

## 2019-06-19 DIAGNOSIS — I739 Peripheral vascular disease, unspecified: Secondary | ICD-10-CM | POA: Diagnosis not present

## 2019-06-19 DIAGNOSIS — N183 Chronic kidney disease, stage 3 unspecified: Secondary | ICD-10-CM | POA: Diagnosis not present

## 2019-06-19 DIAGNOSIS — I5042 Chronic combined systolic (congestive) and diastolic (congestive) heart failure: Secondary | ICD-10-CM | POA: Diagnosis not present

## 2019-06-19 DIAGNOSIS — M158 Other polyosteoarthritis: Secondary | ICD-10-CM | POA: Diagnosis not present

## 2019-06-19 DIAGNOSIS — G8929 Other chronic pain: Secondary | ICD-10-CM | POA: Diagnosis not present

## 2019-06-19 DIAGNOSIS — G809 Cerebral palsy, unspecified: Secondary | ICD-10-CM | POA: Diagnosis not present

## 2019-06-19 DIAGNOSIS — I130 Hypertensive heart and chronic kidney disease with heart failure and stage 1 through stage 4 chronic kidney disease, or unspecified chronic kidney disease: Secondary | ICD-10-CM | POA: Diagnosis not present

## 2019-06-19 DIAGNOSIS — M05471 Rheumatoid myopathy with rheumatoid arthritis of right ankle and foot: Secondary | ICD-10-CM | POA: Diagnosis not present

## 2019-06-19 DIAGNOSIS — I2510 Atherosclerotic heart disease of native coronary artery without angina pectoris: Secondary | ICD-10-CM | POA: Diagnosis not present

## 2019-06-20 LAB — SURGICAL PATHOLOGY

## 2019-06-24 DIAGNOSIS — M05471 Rheumatoid myopathy with rheumatoid arthritis of right ankle and foot: Secondary | ICD-10-CM | POA: Diagnosis not present

## 2019-06-24 DIAGNOSIS — G8929 Other chronic pain: Secondary | ICD-10-CM | POA: Diagnosis not present

## 2019-06-24 DIAGNOSIS — I739 Peripheral vascular disease, unspecified: Secondary | ICD-10-CM | POA: Diagnosis not present

## 2019-06-24 DIAGNOSIS — G809 Cerebral palsy, unspecified: Secondary | ICD-10-CM | POA: Diagnosis not present

## 2019-06-24 DIAGNOSIS — I5042 Chronic combined systolic (congestive) and diastolic (congestive) heart failure: Secondary | ICD-10-CM

## 2019-06-24 DIAGNOSIS — I2510 Atherosclerotic heart disease of native coronary artery without angina pectoris: Secondary | ICD-10-CM | POA: Diagnosis not present

## 2019-06-24 DIAGNOSIS — M158 Other polyosteoarthritis: Secondary | ICD-10-CM | POA: Diagnosis not present

## 2019-06-24 DIAGNOSIS — N183 Chronic kidney disease, stage 3 unspecified: Secondary | ICD-10-CM

## 2019-06-24 DIAGNOSIS — I130 Hypertensive heart and chronic kidney disease with heart failure and stage 1 through stage 4 chronic kidney disease, or unspecified chronic kidney disease: Secondary | ICD-10-CM

## 2019-06-27 DIAGNOSIS — G8929 Other chronic pain: Secondary | ICD-10-CM | POA: Diagnosis not present

## 2019-06-27 DIAGNOSIS — I5042 Chronic combined systolic (congestive) and diastolic (congestive) heart failure: Secondary | ICD-10-CM | POA: Diagnosis not present

## 2019-06-27 DIAGNOSIS — I2510 Atherosclerotic heart disease of native coronary artery without angina pectoris: Secondary | ICD-10-CM | POA: Diagnosis not present

## 2019-06-27 DIAGNOSIS — N183 Chronic kidney disease, stage 3 unspecified: Secondary | ICD-10-CM | POA: Diagnosis not present

## 2019-06-27 DIAGNOSIS — G809 Cerebral palsy, unspecified: Secondary | ICD-10-CM | POA: Diagnosis not present

## 2019-06-27 DIAGNOSIS — M158 Other polyosteoarthritis: Secondary | ICD-10-CM | POA: Diagnosis not present

## 2019-06-27 DIAGNOSIS — I739 Peripheral vascular disease, unspecified: Secondary | ICD-10-CM | POA: Diagnosis not present

## 2019-06-27 DIAGNOSIS — M05471 Rheumatoid myopathy with rheumatoid arthritis of right ankle and foot: Secondary | ICD-10-CM | POA: Diagnosis not present

## 2019-06-27 DIAGNOSIS — I130 Hypertensive heart and chronic kidney disease with heart failure and stage 1 through stage 4 chronic kidney disease, or unspecified chronic kidney disease: Secondary | ICD-10-CM | POA: Diagnosis not present

## 2019-07-08 ENCOUNTER — Encounter: Payer: Medicare HMO | Admitting: Family Medicine

## 2019-07-08 DIAGNOSIS — M158 Other polyosteoarthritis: Secondary | ICD-10-CM | POA: Diagnosis not present

## 2019-07-08 DIAGNOSIS — G8929 Other chronic pain: Secondary | ICD-10-CM | POA: Diagnosis not present

## 2019-07-08 DIAGNOSIS — N183 Chronic kidney disease, stage 3 unspecified: Secondary | ICD-10-CM | POA: Diagnosis not present

## 2019-07-08 DIAGNOSIS — I739 Peripheral vascular disease, unspecified: Secondary | ICD-10-CM | POA: Diagnosis not present

## 2019-07-08 DIAGNOSIS — M05471 Rheumatoid myopathy with rheumatoid arthritis of right ankle and foot: Secondary | ICD-10-CM | POA: Diagnosis not present

## 2019-07-08 DIAGNOSIS — G809 Cerebral palsy, unspecified: Secondary | ICD-10-CM | POA: Diagnosis not present

## 2019-07-08 DIAGNOSIS — I5042 Chronic combined systolic (congestive) and diastolic (congestive) heart failure: Secondary | ICD-10-CM | POA: Diagnosis not present

## 2019-07-08 DIAGNOSIS — I2510 Atherosclerotic heart disease of native coronary artery without angina pectoris: Secondary | ICD-10-CM | POA: Diagnosis not present

## 2019-07-08 DIAGNOSIS — I130 Hypertensive heart and chronic kidney disease with heart failure and stage 1 through stage 4 chronic kidney disease, or unspecified chronic kidney disease: Secondary | ICD-10-CM | POA: Diagnosis not present

## 2019-07-15 ENCOUNTER — Ambulatory Visit: Payer: Medicare HMO | Attending: Internal Medicine

## 2019-07-15 DIAGNOSIS — Z20822 Contact with and (suspected) exposure to covid-19: Secondary | ICD-10-CM

## 2019-07-15 DIAGNOSIS — Z20828 Contact with and (suspected) exposure to other viral communicable diseases: Secondary | ICD-10-CM | POA: Diagnosis not present

## 2019-07-16 ENCOUNTER — Encounter: Payer: Self-pay | Admitting: Family Medicine

## 2019-07-16 DIAGNOSIS — I5042 Chronic combined systolic (congestive) and diastolic (congestive) heart failure: Secondary | ICD-10-CM | POA: Diagnosis not present

## 2019-07-16 DIAGNOSIS — I2510 Atherosclerotic heart disease of native coronary artery without angina pectoris: Secondary | ICD-10-CM | POA: Diagnosis not present

## 2019-07-16 DIAGNOSIS — I130 Hypertensive heart and chronic kidney disease with heart failure and stage 1 through stage 4 chronic kidney disease, or unspecified chronic kidney disease: Secondary | ICD-10-CM | POA: Diagnosis not present

## 2019-07-16 DIAGNOSIS — M158 Other polyosteoarthritis: Secondary | ICD-10-CM | POA: Diagnosis not present

## 2019-07-16 DIAGNOSIS — I739 Peripheral vascular disease, unspecified: Secondary | ICD-10-CM | POA: Diagnosis not present

## 2019-07-16 DIAGNOSIS — G8929 Other chronic pain: Secondary | ICD-10-CM | POA: Diagnosis not present

## 2019-07-16 DIAGNOSIS — N183 Chronic kidney disease, stage 3 unspecified: Secondary | ICD-10-CM | POA: Diagnosis not present

## 2019-07-16 DIAGNOSIS — G809 Cerebral palsy, unspecified: Secondary | ICD-10-CM | POA: Diagnosis not present

## 2019-07-16 DIAGNOSIS — M05471 Rheumatoid myopathy with rheumatoid arthritis of right ankle and foot: Secondary | ICD-10-CM | POA: Diagnosis not present

## 2019-07-16 LAB — NOVEL CORONAVIRUS, NAA: SARS-CoV-2, NAA: NOT DETECTED

## 2019-07-17 ENCOUNTER — Telehealth: Payer: Self-pay | Admitting: Family Medicine

## 2019-07-17 ENCOUNTER — Telehealth: Payer: Self-pay

## 2019-07-17 NOTE — Telephone Encounter (Signed)
Patient is calling to ask if Dr. Maralyn Sago office can send her a copy of her negative COVID test. 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 Please advise Cb- [phone]

## 2019-07-17 NOTE — Telephone Encounter (Signed)
Pt notified of negative COVID-19 results. Understanding verbalized.  Chasta M Hopkins   

## 2019-07-17 NOTE — Telephone Encounter (Signed)
Patient is calling is receive her negative COVID test. Patient expressed understanding.

## 2019-07-21 ENCOUNTER — Ambulatory Visit: Payer: Medicare HMO | Admitting: Family Medicine

## 2019-07-21 ENCOUNTER — Telehealth: Payer: Self-pay

## 2019-07-21 NOTE — Telephone Encounter (Signed)
Im not sure what this visit was for, but there are only same day slots on Friday. Please advise if ok to work in, or should patient be rescheduled with a different provider?

## 2019-07-21 NOTE — Telephone Encounter (Signed)
Tried calling patient. Left message to call back. OK for [facility] to advise patient and use same day slot to reschedule missed  Appointment.

## 2019-07-21 NOTE — Telephone Encounter (Signed)
Copied from [place] [phone]. Topic: General - Other >> Jul 21, 2019 11:04 AM Nils Flack wrote: Reason for CRM: pt missed appt and is needing to reschedule as soon as possible.  She says that she would like it on Friday  Please call [phone]

## 2019-07-21 NOTE — Progress Notes (Deleted)
Patient: Tina Salinas Female    DOB: 1945/09/28   74 y.o.   MRN: [id number] Visit Date: 07/21/2019  Today's Provider: Lelon Huh, MD   No chief complaint on file.  Subjective:     HPI  Allergies  Allergen Reactions  . Etodolac     Other reaction(s): Unknown  . Sulfa Antibiotics   . Tetracycline      Current Outpatient Medications:  .  betamethasone valerate ointment (VALISONE) 0.1 %, Apply topically., Disp: , Rfl:  .  acyclovir (ZOVIRAX) 800 MG tablet, Take 1 tablet (800 mg total) by mouth 3 (three) times daily as needed., Disp: 270 tablet, Rfl: 2 .  alendronate (FOSAMAX) 70 MG tablet, Take 70 mg by mouth once a week. , Disp: , Rfl:  .  ALPRAZolam (XANAX) 0.25 MG tablet, TAKE ONE TABLET BY MOUTH AT BEDTIME AS NEEDED FOR SLEEP, Disp: 90 tablet, Rfl: 3 .  amiodarone (PACERONE) 200 MG tablet, Take 200 mg by mouth daily., Disp: , Rfl:  .  aspirin 81 MG tablet, Take 81 mg by mouth daily., Disp: , Rfl:  .  Calcium Carbonate Antacid (CALCIUM CARBONATE PO), Take by mouth daily., Disp: , Rfl:  .  carvedilol (COREG) 3.125 MG tablet, Take 1 tablet (3.125 mg total) by mouth 2 (two) times daily., Disp: 60 tablet, Rfl: 3 .  cholecalciferol (VITAMIN D) 1000 UNITS tablet, Take 1,000 Units by mouth daily. , Disp: , Rfl:  .  collagenase (SANTYL) ointment, Apply 1 application topically daily., Disp: 90 g, Rfl: 3 .  cyclobenzaprine (FLEXERIL) 5 MG tablet, Take 1 tablet (5 mg total) by mouth 3 (three) times daily as needed for muscle spasms., Disp: 60 tablet, Rfl: 1 .  cyproheptadine (PERIACTIN) 4 MG tablet, Take 4 mg by mouth 3 (three) times daily as needed for allergies., Disp: , Rfl:  .  fluconazole (DIFLUCAN) 150 MG tablet, TAKE ONE TABLET BY MOUTH ONCE FOR ONE DOSE, Disp: 1 tablet, Rfl: 0 .  fluticasone (FLONASE) 50 MCG/ACT nasal spray, Place 2 sprays into both nostrils daily., Disp: 16 g, Rfl: 6 .  folic acid (FOLVITE) 1 MG tablet, Take 1 mg by mouth daily., Disp: , Rfl:  .   furosemide (LASIX) 20 MG tablet, Take 1 tablet (20 mg total) by mouth daily., Disp: 30 tablet, Rfl: 1 .  HYDROcodone-acetaminophen (NORCO) 7.5-325 MG tablet, Take 2 tablets by mouth 3 (three) times daily as needed for moderate pain., Disp: 540 tablet, Rfl: 0 .  hydroxychloroquine (PLAQUENIL) 200 MG tablet, Take 400 mg by mouth daily. , Disp: , Rfl:  .  ibuprofen (ADVIL) 800 MG tablet, Take 800 mg by mouth every 8 (eight) hours as needed., Disp: , Rfl:  .  latanoprost (XALATAN) 0.005 % ophthalmic solution, Place 1 drop into both eyes at bedtime., Disp: , Rfl:  .  Methotrexate Sodium (METHOTREXATE, PF,) 50 MG/2ML injection, , Disp: , Rfl:  .  Methotrexate, PF, 25 MG/0.5ML SOAJ, Inject 1.4 mLs into the skin once a week., Disp: , Rfl:  .  Methylcellulose, Laxative, (GNP FIBER THERAPY PO), Take 1 Dose by mouth daily as needed (constipation). , Disp: , Rfl:  .  mirtazapine (REMERON) 30 MG tablet, Take 1 tablet (30 mg total) by mouth at bedtime., Disp: 30 tablet, Rfl: 2 .  oxybutynin (DITROPAN) 5 MG tablet, TAKE 1 TABLET TWICE DAILY AS NEEDED  FOR  BLADDER  SPASMS, Disp: 180 tablet, Rfl: 4 .  pravastatin (PRAVACHOL) 40 MG tablet, Take 1  tablet (40 mg total) by mouth at bedtime., Disp: 30 tablet, Rfl: 0 .  predniSONE (DELTASONE) 2.5 MG tablet, Take 2.5 mg by mouth daily with breakfast., Disp: , Rfl:  .  promethazine (PHENERGAN) 25 MG tablet, Take 25 mg by mouth every 6 (six) hours as needed for nausea or vomiting., Disp: , Rfl:   Review of Systems  Social History   Tobacco Use  . Smoking status: Never Smoker  . Smokeless tobacco: Never Used  Substance Use Topics  . Alcohol use: No      Objective:   There were no vitals taken for this visit. There were no vitals filed for this visit.There is no height or weight on file to calculate BMI.   Physical Exam   No results found for any visits on 07/21/19.     Assessment & Plan        Lelon Huh, MD  [facility]

## 2019-07-21 NOTE — Telephone Encounter (Signed)
That's fine, she can have same day slot

## 2019-07-22 ENCOUNTER — Encounter: Payer: Self-pay | Admitting: Family Medicine

## 2019-07-23 ENCOUNTER — Ambulatory Visit (INDEPENDENT_AMBULATORY_CARE_PROVIDER_SITE_OTHER): Payer: Medicare HMO | Admitting: Family Medicine

## 2019-07-23 ENCOUNTER — Encounter: Payer: Self-pay | Admitting: Family Medicine

## 2019-07-23 ENCOUNTER — Other Ambulatory Visit: Payer: Self-pay | Admitting: Family Medicine

## 2019-07-23 ENCOUNTER — Other Ambulatory Visit: Payer: Self-pay

## 2019-07-23 VITALS — BP 110/62 | HR 59 | Temp 96.9°F | Resp 16 | Wt 92.0 lb

## 2019-07-23 DIAGNOSIS — I25118 Atherosclerotic heart disease of native coronary artery with other forms of angina pectoris: Secondary | ICD-10-CM | POA: Diagnosis not present

## 2019-07-23 DIAGNOSIS — N183 Chronic kidney disease, stage 3 unspecified: Secondary | ICD-10-CM | POA: Diagnosis not present

## 2019-07-23 DIAGNOSIS — E7800 Pure hypercholesterolemia, unspecified: Secondary | ICD-10-CM

## 2019-07-23 DIAGNOSIS — G809 Cerebral palsy, unspecified: Secondary | ICD-10-CM

## 2019-07-23 DIAGNOSIS — I5042 Chronic combined systolic (congestive) and diastolic (congestive) heart failure: Secondary | ICD-10-CM | POA: Diagnosis not present

## 2019-07-23 DIAGNOSIS — M05741 Rheumatoid arthritis with rheumatoid factor of right hand without organ or systems involvement: Secondary | ICD-10-CM | POA: Diagnosis not present

## 2019-07-23 DIAGNOSIS — I480 Paroxysmal atrial fibrillation: Secondary | ICD-10-CM | POA: Diagnosis not present

## 2019-07-23 DIAGNOSIS — I428 Other cardiomyopathies: Secondary | ICD-10-CM | POA: Diagnosis not present

## 2019-07-23 MED ORDER — HYDROCODONE-ACETAMINOPHEN 7.5-325 MG PO TABS: 2.0000 | ORAL_TABLET | Freq: Three times a day (TID) | ORAL | 0 refills | Status: DC | PRN

## 2019-07-23 MED ORDER — PRAVASTATIN SODIUM 40 MG PO TABS: 40.0000 mg | ORAL_TABLET | Freq: Every day | ORAL | 12 refills | Status: AC

## 2019-07-23 NOTE — Patient Instructions (Signed)
.   Please review the attached list of medications and notify my office if there are any errors.   . Please bring all of your medications to every appointment so we can make sure that our medication list is the same as yours.   

## 2019-07-23 NOTE — Telephone Encounter (Signed)
Pt is calling and needs a refill on pravastatin 40 mg. [facility] in [place]. Pt had an appt with dr Caryn Section today

## 2019-07-23 NOTE — Telephone Encounter (Signed)
Please advise if OK to refill. Last fill for this prescription was 06/01/2016.

## 2019-07-23 NOTE — Progress Notes (Signed)
Patient: Tina Salinas Female    DOB: [birth year]   74 y.o.   MRN: [id number] Visit Date: 07/23/2019  Today's Provider: Lelon Huh, MD   Chief Complaint  Patient presents with  . Pain   Subjective:     HPI  Follow up for Chronic Pain:  The patient was last seen for this months ago. Changes made at last visit include no changes.  She reports good compliance with treatment. She feels that condition is Unchanged. She is not having side effects.   ------------------------------------------------------------------------------------  Allergies  Allergen Reactions  . Etodolac     Other reaction(s): Unknown  . Sulfa Antibiotics   . Tetracycline      Current Outpatient Medications:  .  acyclovir (ZOVIRAX) 800 MG tablet, Take 1 tablet (800 mg total) by mouth 3 (three) times daily as needed., Disp: 270 tablet, Rfl: 2 .  alendronate (FOSAMAX) 70 MG tablet, Take 70 mg by mouth once a week. , Disp: , Rfl:  .  ALPRAZolam (XANAX) 0.25 MG tablet, TAKE ONE TABLET BY MOUTH AT BEDTIME AS NEEDED FOR SLEEP, Disp: 90 tablet, Rfl: 3 .  amiodarone (PACERONE) 200 MG tablet, Take 200 mg by mouth daily., Disp: , Rfl:  .  aspirin 81 MG tablet, Take 81 mg by mouth daily., Disp: , Rfl:  .  betamethasone valerate ointment (VALISONE) 0.1 %, Apply topically., Disp: , Rfl:  .  Calcium Carbonate Antacid (CALCIUM CARBONATE PO), Take by mouth daily., Disp: , Rfl:  .  cholecalciferol (VITAMIN D) 1000 UNITS tablet, Take 1,000 Units by mouth daily. , Disp: , Rfl:  .  collagenase (SANTYL) ointment, Apply 1 application topically daily., Disp: 90 g, Rfl: 3 .  cyclobenzaprine (FLEXERIL) 5 MG tablet, Take 1 tablet (5 mg total) by mouth 3 (three) times daily as needed for muscle spasms., Disp: 60 tablet, Rfl: 1 .  cyproheptadine (PERIACTIN) 4 MG tablet, Take 4 mg by mouth 3 (three) times daily as needed for allergies., Disp: , Rfl:  .  fluticasone (FLONASE) 50 MCG/ACT nasal spray, Place 2 sprays into both  nostrils daily., Disp: 16 g, Rfl: 6 .  folic acid (FOLVITE) 1 MG tablet, Take 1 mg by mouth daily., Disp: , Rfl:  .  furosemide (LASIX) 20 MG tablet, Take 1 tablet (20 mg total) by mouth daily., Disp: 30 tablet, Rfl: 1 .  HYDROcodone-acetaminophen (NORCO) 7.5-325 MG tablet, Take 2 tablets by mouth 3 (three) times daily as needed for moderate pain., Disp: 540 tablet, Rfl: 0 .  hydroxychloroquine (PLAQUENIL) 200 MG tablet, Take 400 mg by mouth daily. , Disp: , Rfl:  .  ibuprofen (ADVIL) 800 MG tablet, Take 800 mg by mouth every 8 (eight) hours as needed., Disp: , Rfl:  .  latanoprost (XALATAN) 0.005 % ophthalmic solution, Place 1 drop into both eyes at bedtime., Disp: , Rfl:  .  Methotrexate Sodium (METHOTREXATE, PF,) 50 MG/2ML injection, , Disp: , Rfl:  .  Methotrexate, PF, 25 MG/0.5ML SOAJ, Inject 1.4 mLs into the skin once a week., Disp: , Rfl:  .  Methylcellulose, Laxative, (GNP FIBER THERAPY PO), Take 1 Dose by mouth daily as needed (constipation). , Disp: , Rfl:  .  mirtazapine (REMERON) 30 MG tablet, Take 1 tablet (30 mg total) by mouth at bedtime., Disp: 30 tablet, Rfl: 2 .  oxybutynin (DITROPAN) 5 MG tablet, TAKE 1 TABLET TWICE DAILY AS NEEDED  FOR  BLADDER  SPASMS, Disp: 180 tablet, Rfl: 4 .  pravastatin (  PRAVACHOL) 40 MG tablet, Take 1 tablet (40 mg total) by mouth at bedtime., Disp: 30 tablet, Rfl: 0 .  predniSONE (DELTASONE) 2.5 MG tablet, Take 2.5 mg by mouth daily with breakfast., Disp: , Rfl:  .  promethazine (PHENERGAN) 25 MG tablet, Take 25 mg by mouth every 6 (six) hours as needed for nausea or vomiting., Disp: , Rfl:  .  carvedilol (COREG) 3.125 MG tablet, Take 1 tablet (3.125 mg total) by mouth 2 (two) times daily., Disp: 60 tablet, Rfl: 3 .  fluconazole (DIFLUCAN) 150 MG tablet, TAKE ONE TABLET BY MOUTH ONCE FOR ONE DOSE (Patient not taking: Reported on 07/23/2019), Disp: 1 tablet, Rfl: 0  Review of Systems  Constitutional: Negative for appetite change, chills, fatigue and fever.   Respiratory: Negative for chest tightness and shortness of breath.   Cardiovascular: Negative for chest pain and palpitations.  Gastrointestinal: Negative for abdominal pain, nausea and vomiting.  Musculoskeletal: Positive for arthralgias and myalgias.  Neurological: Negative for dizziness and weakness.    Social History   Tobacco Use  . Smoking status: Never Smoker  . Smokeless tobacco: Never Used  Substance Use Topics  . Alcohol use: No      Objective:   BP 110/62 (BP Location: Left Arm, Patient Position: Sitting, Cuff Size: Normal)   Pulse (!) 59   Temp (!) 96.9 F (36.1 C) (Temporal)   Resp 16   Wt 92 lb (41.7 kg)   SpO2 99% Comment: room air  BMI 17.97 kg/m  Vitals:   07/23/19 1058  BP: 110/62  Pulse: (!) 59  Resp: 16  Temp: (!) 96.9 F (36.1 C)  TempSrc: Temporal  SpO2: 99%  Weight: 92 lb (41.7 kg)  Body mass index is 17.97 kg/m.   Physical Exam    General Appearance:    Thin female in no acute distress  Eyes:    PERRL, conjunctiva/corneas clear, EOM's intact       Lungs:     Clear to auscultation bilaterally, respirations unlabored  Heart:    Bradycardic. Regular rhythm. No murmurs, rubs, or gallops.   MS:   All extremities are intact.   Neurologic:   Awake, alert, oriented x 3. Sitting comfortably in wheechair         Assessment & Plan    1. 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 refill- HYDROcodone-acetaminophen (NORCO) 7.5-325 MG tablet; Take 2 tablets by mouth 3 (three) times daily as needed for moderate pain.  Dispense: 540 tablet; Refill: 0  2. Cerebral palsy, unspecified type ([place])   3. Chronic kidney disease (CKD), active medical management without dialysis, stage 3 (moderate) Advised she is due to check renal functions. She is seeing Dr. Jefm Bryant next week and doesn't want to get blood drawn twice. She will have him check renal functions in addition to his routine labs.   4.  Chronic combined systolic and diastolic CHF (congestive heart failure) (HCC) Currently asymptomatic. Doing well on carvedilol     Lelon Huh, MD  [place]

## 2019-07-29 ENCOUNTER — Telehealth: Payer: Self-pay

## 2019-07-29 ENCOUNTER — Ambulatory Visit: Payer: Medicare HMO | Admitting: Adult Health

## 2019-07-29 NOTE — Telephone Encounter (Signed)
Dr. Caryn Section, do you know anything about a liver test? I see from the last office note that patient was due for Renal function check, and she was to have Dr. Jefm Bryant add this lab in addition to his routine labs.

## 2019-07-29 NOTE — Telephone Encounter (Signed)
I told her the other day she needs her kidney functions  Checked. She said she wanted to wait until Dr. Jefm Bryant sees later this month who follows her liver functions and CBC and just have him order all the labs.   If she wants [place] to get her labs instead  we can go ahead and order a met c and a cbc which would cover everything.

## 2019-07-29 NOTE — Telephone Encounter (Signed)
Plavix helps prevent heart attacks and strokes, she should be taking it.

## 2019-07-29 NOTE — Telephone Encounter (Signed)
Copied from [place] [phone]. Topic: General - Other >> Jul 28, 2019  4:53 PM Antonieta Iba C wrote: Reason for CRM: pt called in for assistance. Pt is having a kidney test, and also a liver test. Pt would like to know if she could have both at the same time? Please advise

## 2019-07-29 NOTE — Telephone Encounter (Signed)
Patient advised and verbalized understanding. Patient states she is going to call Dr. Nancie Neas office tomorrow and ask if they can order a renal function panel along with his routine labs. Patient will let us know if they arent able to do it.   Also, patient states that her Cardiologist Dr Ubaldo Glassing told her that she needs to be on Plavix. Patient wasn't sure if she was supposed to be taking it and didn't know if this was a medication that Dr. Caryn Section prescribed. I looked in Dr. Bethanne Ginger telephone notes and it looks like a 1 years supply of Plavix was sent into the pharmacy. I advise patient of this and she wanted to know if this is a medication that Dr. Caryn Section thinks she should be on?

## 2019-07-30 NOTE — Telephone Encounter (Signed)
Patient advised.

## 2019-07-31 DIAGNOSIS — I739 Peripheral vascular disease, unspecified: Secondary | ICD-10-CM | POA: Diagnosis not present

## 2019-07-31 DIAGNOSIS — G8929 Other chronic pain: Secondary | ICD-10-CM | POA: Diagnosis not present

## 2019-07-31 DIAGNOSIS — G809 Cerebral palsy, unspecified: Secondary | ICD-10-CM | POA: Diagnosis not present

## 2019-07-31 DIAGNOSIS — M158 Other polyosteoarthritis: Secondary | ICD-10-CM | POA: Diagnosis not present

## 2019-07-31 DIAGNOSIS — I5042 Chronic combined systolic (congestive) and diastolic (congestive) heart failure: Secondary | ICD-10-CM | POA: Diagnosis not present

## 2019-07-31 DIAGNOSIS — N183 Chronic kidney disease, stage 3 unspecified: Secondary | ICD-10-CM | POA: Diagnosis not present

## 2019-07-31 DIAGNOSIS — I2510 Atherosclerotic heart disease of native coronary artery without angina pectoris: Secondary | ICD-10-CM | POA: Diagnosis not present

## 2019-07-31 DIAGNOSIS — I130 Hypertensive heart and chronic kidney disease with heart failure and stage 1 through stage 4 chronic kidney disease, or unspecified chronic kidney disease: Secondary | ICD-10-CM | POA: Diagnosis not present

## 2019-07-31 DIAGNOSIS — M05471 Rheumatoid myopathy with rheumatoid arthritis of right ankle and foot: Secondary | ICD-10-CM | POA: Diagnosis not present

## 2019-08-07 DIAGNOSIS — I5022 Chronic systolic (congestive) heart failure: Secondary | ICD-10-CM | POA: Diagnosis not present

## 2019-08-07 DIAGNOSIS — M0579 Rheumatoid arthritis with rheumatoid factor of multiple sites without organ or systems involvement: Secondary | ICD-10-CM | POA: Diagnosis not present

## 2019-08-07 DIAGNOSIS — Z79899 Other long term (current) drug therapy: Secondary | ICD-10-CM | POA: Diagnosis not present

## 2019-08-07 DIAGNOSIS — M810 Age-related osteoporosis without current pathological fracture: Secondary | ICD-10-CM | POA: Diagnosis not present

## 2019-08-08 DIAGNOSIS — M05471 Rheumatoid myopathy with rheumatoid arthritis of right ankle and foot: Secondary | ICD-10-CM | POA: Diagnosis not present

## 2019-08-08 DIAGNOSIS — I130 Hypertensive heart and chronic kidney disease with heart failure and stage 1 through stage 4 chronic kidney disease, or unspecified chronic kidney disease: Secondary | ICD-10-CM | POA: Diagnosis not present

## 2019-08-08 DIAGNOSIS — I739 Peripheral vascular disease, unspecified: Secondary | ICD-10-CM | POA: Diagnosis not present

## 2019-08-08 DIAGNOSIS — N183 Chronic kidney disease, stage 3 unspecified: Secondary | ICD-10-CM | POA: Diagnosis not present

## 2019-08-08 DIAGNOSIS — I2510 Atherosclerotic heart disease of native coronary artery without angina pectoris: Secondary | ICD-10-CM | POA: Diagnosis not present

## 2019-08-08 DIAGNOSIS — G809 Cerebral palsy, unspecified: Secondary | ICD-10-CM | POA: Diagnosis not present

## 2019-08-08 DIAGNOSIS — M158 Other polyosteoarthritis: Secondary | ICD-10-CM | POA: Diagnosis not present

## 2019-08-08 DIAGNOSIS — I5042 Chronic combined systolic (congestive) and diastolic (congestive) heart failure: Secondary | ICD-10-CM | POA: Diagnosis not present

## 2019-08-08 DIAGNOSIS — G8929 Other chronic pain: Secondary | ICD-10-CM | POA: Diagnosis not present

## 2019-08-11 ENCOUNTER — Telehealth: Payer: Self-pay

## 2019-08-11 NOTE — Telephone Encounter (Signed)
Patient was confused about what her appointment was for on 08/25/2019. She thought that she had to get blood work done and I advised patient that it was for a medication follow-up. Patient also advised that Dr.Fisher would like for her to have Dr.Kernodle to order a MET C and CBC.

## 2019-08-11 NOTE — Telephone Encounter (Signed)
Copied from [place] [phone]. Topic: General - Other >> Aug 11, 2019 11:32 AM Reyne Dumas L wrote: Reason for CRM:   Pt states she needs to speak with PCPs assistant about medication.  When asked for clarification pt stated she didn't need to discuss with me she just needs a call from nurse.

## 2019-08-13 ENCOUNTER — Telehealth: Payer: Self-pay | Admitting: Family Medicine

## 2019-08-13 DIAGNOSIS — M05471 Rheumatoid myopathy with rheumatoid arthritis of right ankle and foot: Secondary | ICD-10-CM | POA: Diagnosis not present

## 2019-08-13 DIAGNOSIS — I130 Hypertensive heart and chronic kidney disease with heart failure and stage 1 through stage 4 chronic kidney disease, or unspecified chronic kidney disease: Secondary | ICD-10-CM | POA: Diagnosis not present

## 2019-08-13 DIAGNOSIS — M158 Other polyosteoarthritis: Secondary | ICD-10-CM | POA: Diagnosis not present

## 2019-08-13 DIAGNOSIS — I2510 Atherosclerotic heart disease of native coronary artery without angina pectoris: Secondary | ICD-10-CM | POA: Diagnosis not present

## 2019-08-13 DIAGNOSIS — N183 Chronic kidney disease, stage 3 unspecified: Secondary | ICD-10-CM | POA: Diagnosis not present

## 2019-08-13 DIAGNOSIS — I739 Peripheral vascular disease, unspecified: Secondary | ICD-10-CM | POA: Diagnosis not present

## 2019-08-13 DIAGNOSIS — I5042 Chronic combined systolic (congestive) and diastolic (congestive) heart failure: Secondary | ICD-10-CM | POA: Diagnosis not present

## 2019-08-13 DIAGNOSIS — G8929 Other chronic pain: Secondary | ICD-10-CM | POA: Diagnosis not present

## 2019-08-13 DIAGNOSIS — G809 Cerebral palsy, unspecified: Secondary | ICD-10-CM | POA: Diagnosis not present

## 2019-08-13 NOTE — Telephone Encounter (Signed)
Home Health Verbal Orders - Caller/Agency: [facility]  Callback Number: [phone] VM okay  Requesting OT/PT/Skilled Nursing/Social Work/Speech Therapy: Recommends continuing at home PT  Frequency:  1 w 6

## 2019-08-13 NOTE — Telephone Encounter (Signed)
That's fine

## 2019-08-13 NOTE — Telephone Encounter (Signed)
Left message giving verbal orders.  Thanks,   -Mickel Baas

## 2019-08-19 DIAGNOSIS — H40153 Residual stage of open-angle glaucoma, bilateral: Secondary | ICD-10-CM | POA: Diagnosis not present

## 2019-08-19 DIAGNOSIS — H25813 Combined forms of age-related cataract, bilateral: Secondary | ICD-10-CM | POA: Diagnosis not present

## 2019-08-20 DIAGNOSIS — I739 Peripheral vascular disease, unspecified: Secondary | ICD-10-CM | POA: Diagnosis not present

## 2019-08-20 DIAGNOSIS — M158 Other polyosteoarthritis: Secondary | ICD-10-CM | POA: Diagnosis not present

## 2019-08-20 DIAGNOSIS — G8929 Other chronic pain: Secondary | ICD-10-CM | POA: Diagnosis not present

## 2019-08-20 DIAGNOSIS — G809 Cerebral palsy, unspecified: Secondary | ICD-10-CM | POA: Diagnosis not present

## 2019-08-20 DIAGNOSIS — N183 Chronic kidney disease, stage 3 unspecified: Secondary | ICD-10-CM | POA: Diagnosis not present

## 2019-08-20 DIAGNOSIS — I5042 Chronic combined systolic (congestive) and diastolic (congestive) heart failure: Secondary | ICD-10-CM | POA: Diagnosis not present

## 2019-08-20 DIAGNOSIS — I2510 Atherosclerotic heart disease of native coronary artery without angina pectoris: Secondary | ICD-10-CM | POA: Diagnosis not present

## 2019-08-20 DIAGNOSIS — I130 Hypertensive heart and chronic kidney disease with heart failure and stage 1 through stage 4 chronic kidney disease, or unspecified chronic kidney disease: Secondary | ICD-10-CM | POA: Diagnosis not present

## 2019-08-20 DIAGNOSIS — M05471 Rheumatoid myopathy with rheumatoid arthritis of right ankle and foot: Secondary | ICD-10-CM | POA: Diagnosis not present

## 2019-08-25 ENCOUNTER — Other Ambulatory Visit: Payer: Self-pay

## 2019-08-25 ENCOUNTER — Ambulatory Visit (INDEPENDENT_AMBULATORY_CARE_PROVIDER_SITE_OTHER): Payer: Medicare HMO | Admitting: Family Medicine

## 2019-08-25 ENCOUNTER — Encounter: Payer: Self-pay | Admitting: Family Medicine

## 2019-08-25 VITALS — BP 117/70 | HR 67 | Temp 96.8°F | Wt 94.8 lb

## 2019-08-25 DIAGNOSIS — E559 Vitamin D deficiency, unspecified: Secondary | ICD-10-CM

## 2019-08-25 DIAGNOSIS — M05741 Rheumatoid arthritis with rheumatoid factor of right hand without organ or systems involvement: Secondary | ICD-10-CM

## 2019-08-25 DIAGNOSIS — E7800 Pure hypercholesterolemia, unspecified: Secondary | ICD-10-CM | POA: Diagnosis not present

## 2019-08-25 DIAGNOSIS — N1832 Chronic kidney disease, stage 3b: Secondary | ICD-10-CM

## 2019-08-25 MED ORDER — HYDROCODONE-ACETAMINOPHEN 7.5-325 MG PO TABS: 2.0000 | ORAL_TABLET | Freq: Three times a day (TID) | ORAL | 0 refills | Status: DC | PRN

## 2019-08-25 NOTE — Progress Notes (Signed)
Patient: Tina Salinas Female    DOB: Jul 31, 1945   74 y.o.   MRN: [id number] Visit Date: 08/25/2019  Today's Provider: Lelon Huh, MD   Chief Complaint  Patient presents with  . Chronic kidney disease follow up   Subjective:     HPI  Follow up for CKD:  The patient was last seen for this 1 months ago.  Changes made at last visit include patient was advised she is due to check renal functions. She  Had a follow up scheduled  Dr. Jefm Bryant the following week, and didn't want to get blood drawn  twice. She agreed to have him check renal functions in addition to his routine labs.   She reports fair compliance with treatment. Patient states she has a follow up scheduled next week now with Dr. Jefm Bryant to have labs done.  She feels that condition is Unchanged. She is not having side effects.   ------------------------------------------------------------------------------------  Allergies  Allergen Reactions  . Etodolac     Other reaction(s): Unknown  . Sulfa Antibiotics   . Tetracycline      Current Outpatient Medications:  .  acyclovir (ZOVIRAX) 800 MG tablet, Take 1 tablet (800 mg total) by mouth 3 (three) times daily as needed., Disp: 270 tablet, Rfl: 2 .  alendronate (FOSAMAX) 70 MG tablet, Take 70 mg by mouth once a week. , Disp: , Rfl:  .  ALPRAZolam (XANAX) 0.25 MG tablet, TAKE ONE TABLET BY MOUTH AT BEDTIME AS NEEDED FOR SLEEP, Disp: 90 tablet, Rfl: 3 .  amiodarone (PACERONE) 200 MG tablet, Take 200 mg by mouth daily., Disp: , Rfl:  .  aspirin 81 MG tablet, Take 81 mg by mouth daily., Disp: , Rfl:  .  betamethasone valerate ointment (VALISONE) 0.1 %, Apply topically., Disp: , Rfl:  .  Calcium Carbonate Antacid (CALCIUM CARBONATE PO), Take by mouth daily., Disp: , Rfl:  .  carvedilol (COREG) 3.125 MG tablet, Take 1 tablet (3.125 mg total) by mouth 2 (two) times daily., Disp: 60 tablet, Rfl: 3 .  cholecalciferol (VITAMIN D) 1000 UNITS tablet, Take 1,000 Units by  mouth daily. , Disp: , Rfl:  .  collagenase (SANTYL) ointment, Apply 1 application topically daily., Disp: 90 g, Rfl: 3 .  cyclobenzaprine (FLEXERIL) 5 MG tablet, Take 1 tablet (5 mg total) by mouth 3 (three) times daily as needed for muscle spasms., Disp: 60 tablet, Rfl: 1 .  cyproheptadine (PERIACTIN) 4 MG tablet, Take 4 mg by mouth 3 (three) times daily as needed for allergies., Disp: , Rfl:  .  fluconazole (DIFLUCAN) 150 MG tablet, TAKE ONE TABLET BY MOUTH ONCE FOR ONE DOSE (Patient not taking: Reported on 07/23/2019), Disp: 1 tablet, Rfl: 0 .  fluticasone (FLONASE) 50 MCG/ACT nasal spray, Place 2 sprays into both nostrils daily., Disp: 16 g, Rfl: 6 .  folic acid (FOLVITE) 1 MG tablet, Take 1 mg by mouth daily., Disp: , Rfl:  .  furosemide (LASIX) 20 MG tablet, Take 1 tablet (20 mg total) by mouth daily., Disp: 30 tablet, Rfl: 1 .  HYDROcodone-acetaminophen (NORCO) 7.5-325 MG tablet, Take 2 tablets by mouth 3 (three) times daily as needed for moderate pain., Disp: 540 tablet, Rfl: 0 .  hydroxychloroquine (PLAQUENIL) 200 MG tablet, Take 400 mg by mouth daily. , Disp: , Rfl:  .  ibuprofen (ADVIL) 800 MG tablet, Take 800 mg by mouth every 8 (eight) hours as needed., Disp: , Rfl:  .  latanoprost (XALATAN) 0.005 %  ophthalmic solution, Place 1 drop into both eyes at bedtime., Disp: , Rfl:  .  Methotrexate Sodium (METHOTREXATE, PF,) 50 MG/2ML injection, , Disp: , Rfl:  .  Methotrexate, PF, 25 MG/0.5ML SOAJ, Inject 1.4 mLs into the skin once a week., Disp: , Rfl:  .  Methylcellulose, Laxative, (GNP FIBER THERAPY PO), Take 1 Dose by mouth daily as needed (constipation). , Disp: , Rfl:  .  mirtazapine (REMERON) 30 MG tablet, Take 1 tablet (30 mg total) by mouth at bedtime., Disp: 30 tablet, Rfl: 2 .  oxybutynin (DITROPAN) 5 MG tablet, TAKE 1 TABLET TWICE DAILY AS NEEDED  FOR  BLADDER  SPASMS, Disp: 180 tablet, Rfl: 4 .  pravastatin (PRAVACHOL) 40 MG tablet, Take 1 tablet (40 mg total) by mouth at bedtime.,  Disp: 30 tablet, Rfl: 12 .  predniSONE (DELTASONE) 2.5 MG tablet, Take 2.5 mg by mouth daily with breakfast., Disp: , Rfl:  .  promethazine (PHENERGAN) 25 MG tablet, Take 25 mg by mouth every 6 (six) hours as needed for nausea or vomiting., Disp: , Rfl:   Review of Systems  Constitutional: Negative.   Respiratory: Negative.   Cardiovascular: Negative.   Musculoskeletal: Negative.     Social History   Tobacco Use  . Smoking status: Never Smoker  . Smokeless tobacco: Never Used  Substance Use Topics  . Alcohol use: No      Objective:   BP 117/70 (BP Location: Right Arm, Patient Position: Sitting, Cuff Size: Normal)   Pulse 67   Temp (!) 96.8 F (36 C) (Temporal)   Wt 94 lb 12.8 oz (43 kg)   SpO2 99%   BMI 18.51 kg/m     Physical Exam  General appearance: Thin female, cooperative and in no acute distress Head: Normocephalic, without obvious abnormality, atraumatic Respiratory: Respirations even and unlabored, normal respiratory rate Extremities: All extremities are intact.  Skin: Skin color, texture, turgor normal. No rashes seen  Psych: Appropriate mood and affect. Neurologic: Mental status: Alert, oriented to person, place, and time, thought content appropriate.      Assessment & Plan    1. Chronic kidney disease, stage 3b  - Renal function panel  2. Vitamin D deficiency  - VITAMIN D 25 Hydroxy (Vit-D Deficiency, Fractures)  3. Hypercholesteremia She is tolerating pravastatin well with no adverse effects.   - Lipid panel  She is going to take order with her when she sees Dr. Jefm Bryant and will having him check labs as above.   4. 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  Dispense: 540 tablet; Refill: 0  Follow up dr. Jefm Bryant next week as scheduled.     Lelon Huh, MD    [facility]

## 2019-08-27 ENCOUNTER — Telehealth: Payer: Self-pay

## 2019-08-27 DIAGNOSIS — I2510 Atherosclerotic heart disease of native coronary artery without angina pectoris: Secondary | ICD-10-CM | POA: Diagnosis not present

## 2019-08-27 DIAGNOSIS — I130 Hypertensive heart and chronic kidney disease with heart failure and stage 1 through stage 4 chronic kidney disease, or unspecified chronic kidney disease: Secondary | ICD-10-CM | POA: Diagnosis not present

## 2019-08-27 DIAGNOSIS — M158 Other polyosteoarthritis: Secondary | ICD-10-CM | POA: Diagnosis not present

## 2019-08-27 DIAGNOSIS — I739 Peripheral vascular disease, unspecified: Secondary | ICD-10-CM | POA: Diagnosis not present

## 2019-08-27 DIAGNOSIS — G8929 Other chronic pain: Secondary | ICD-10-CM | POA: Diagnosis not present

## 2019-08-27 DIAGNOSIS — N183 Chronic kidney disease, stage 3 unspecified: Secondary | ICD-10-CM | POA: Diagnosis not present

## 2019-08-27 DIAGNOSIS — M05471 Rheumatoid myopathy with rheumatoid arthritis of right ankle and foot: Secondary | ICD-10-CM | POA: Diagnosis not present

## 2019-08-27 DIAGNOSIS — G809 Cerebral palsy, unspecified: Secondary | ICD-10-CM | POA: Diagnosis not present

## 2019-08-27 DIAGNOSIS — I5042 Chronic combined systolic (congestive) and diastolic (congestive) heart failure: Secondary | ICD-10-CM | POA: Diagnosis not present

## 2019-08-27 NOTE — Telephone Encounter (Signed)
Copied from [place] [id number]. Topic: General - Other >> Aug 27, 2019  3:52 PM Sheran Luz wrote: Patient requesting lab order be faxed to "Dr. Marijo File" at [place]. Patient had no contact information for [facility]. Patient also requesting a call back from office staff as soon as it is sent.

## 2019-08-28 ENCOUNTER — Telehealth: Payer: Self-pay

## 2019-08-28 NOTE — Telephone Encounter (Signed)
Lab slip placed up front for pick up. Patient advised.

## 2019-08-28 NOTE — Telephone Encounter (Signed)
I think she means Precious Reel, her rheumatologist. She is supposed to have an appointment with him this week.

## 2019-08-28 NOTE — Telephone Encounter (Signed)
Dr. Caryn Section do you know "Dr. Marijo File" with [facility]? I believe patient is requesting that we fax over a copy of the lab requisition given to her during her last office visit. Is it ok to fax order?

## 2019-08-28 NOTE — Telephone Encounter (Signed)
Order faxed to Rheumatologist Dr. Cristi Loron (fax number: [phone]). Patient advised. Patient also requested another copy of the lab order to be placed up front for pick up. Lab order placed up front.

## 2019-08-28 NOTE — Telephone Encounter (Signed)
Copied from [place] [id number]. Topic: General - Inquiry >> Aug 28, 2019 10:47 AM Scherrie Gerlach wrote: Reason for CRM: pt states she lost her lab order Dr Caryn Section gave her on 2/08, and she needs a new print out to pick up today.  She needs to take with her to another dr and she says Dr Caryn Section is aware. Pt would also like to know if she calls when she gets there, will someone bring out to her?  Can you call her when ready?

## 2019-09-01 DIAGNOSIS — E7800 Pure hypercholesterolemia, unspecified: Secondary | ICD-10-CM | POA: Diagnosis not present

## 2019-09-01 DIAGNOSIS — M0579 Rheumatoid arthritis with rheumatoid factor of multiple sites without organ or systems involvement: Secondary | ICD-10-CM | POA: Diagnosis not present

## 2019-09-01 DIAGNOSIS — Z79899 Other long term (current) drug therapy: Secondary | ICD-10-CM | POA: Diagnosis not present

## 2019-09-01 DIAGNOSIS — E559 Vitamin D deficiency, unspecified: Secondary | ICD-10-CM | POA: Diagnosis not present

## 2019-09-01 LAB — LIPID PANEL
Cholesterol: 147 (ref 0–200)
HDL: 63 (ref 35–70)
LDL Cholesterol: 70
Triglycerides: 69 (ref 40–160)

## 2019-09-01 LAB — VITAMIN D 25 HYDROXY (VIT D DEFICIENCY, FRACTURES): Vit D, 25-Hydroxy: 14.4

## 2019-09-01 LAB — PHOSPHORUS: Phosphorus: 5

## 2019-09-01 LAB — BASIC METABOLIC PANEL
BUN: 17 (ref 4–21)
CO2: 30 — AB (ref 13–22)
Chloride: 103 (ref 99–108)
Creatinine: 1.1 (ref 0.5–1.1)
Glucose: 64
Potassium: 3.8 (ref 3.4–5.3)
Sodium: 142 (ref 137–147)

## 2019-09-01 LAB — COMPREHENSIVE METABOLIC PANEL
Albumin: 3.5 (ref 3.5–5.0)
Calcium: 8.5 — AB (ref 8.7–10.7)
GFR calc non Af Amer: 49

## 2019-09-01 LAB — CBC AND DIFFERENTIAL
HCT: 36 (ref 36–46)
Hemoglobin: 11.4 — AB (ref 12.0–16.0)
Platelets: 348 (ref 150–399)
WBC: 10.3

## 2019-09-01 LAB — CBC: RBC: 3.38 — AB (ref 3.87–5.11)

## 2019-09-04 DIAGNOSIS — I2510 Atherosclerotic heart disease of native coronary artery without angina pectoris: Secondary | ICD-10-CM | POA: Diagnosis not present

## 2019-09-04 DIAGNOSIS — M05471 Rheumatoid myopathy with rheumatoid arthritis of right ankle and foot: Secondary | ICD-10-CM | POA: Diagnosis not present

## 2019-09-04 DIAGNOSIS — G8929 Other chronic pain: Secondary | ICD-10-CM | POA: Diagnosis not present

## 2019-09-04 DIAGNOSIS — M158 Other polyosteoarthritis: Secondary | ICD-10-CM | POA: Diagnosis not present

## 2019-09-04 DIAGNOSIS — I130 Hypertensive heart and chronic kidney disease with heart failure and stage 1 through stage 4 chronic kidney disease, or unspecified chronic kidney disease: Secondary | ICD-10-CM | POA: Diagnosis not present

## 2019-09-04 DIAGNOSIS — I739 Peripheral vascular disease, unspecified: Secondary | ICD-10-CM | POA: Diagnosis not present

## 2019-09-04 DIAGNOSIS — N183 Chronic kidney disease, stage 3 unspecified: Secondary | ICD-10-CM | POA: Diagnosis not present

## 2019-09-04 DIAGNOSIS — I5042 Chronic combined systolic (congestive) and diastolic (congestive) heart failure: Secondary | ICD-10-CM | POA: Diagnosis not present

## 2019-09-04 DIAGNOSIS — G809 Cerebral palsy, unspecified: Secondary | ICD-10-CM | POA: Diagnosis not present

## 2019-09-09 ENCOUNTER — Telehealth: Payer: Self-pay | Admitting: Family Medicine

## 2019-09-09 DIAGNOSIS — D539 Nutritional anemia, unspecified: Secondary | ICD-10-CM | POA: Insufficient documentation

## 2019-09-09 NOTE — Telephone Encounter (Signed)
Patient advised. She states that Dr. Jefm Bryant started her on Vitamin D RX. She states she bought B 12 tablets already.

## 2019-09-09 NOTE — Telephone Encounter (Signed)
Please advise patient we received lab from Dr. Nancie Neas office. Her kidney functions are better. Her cholesterol is good.  Her Vitamin d levels are very low. She needs to take vitamin D3 2000 units every day.   Her blood count is slightly low. Probably due to lack of vitamin B12. She needs to start OTC vitamin B12 dissolvable tablets 1000mg  once a day

## 2019-09-10 DIAGNOSIS — G8929 Other chronic pain: Secondary | ICD-10-CM | POA: Diagnosis not present

## 2019-09-10 DIAGNOSIS — I130 Hypertensive heart and chronic kidney disease with heart failure and stage 1 through stage 4 chronic kidney disease, or unspecified chronic kidney disease: Secondary | ICD-10-CM | POA: Diagnosis not present

## 2019-09-10 DIAGNOSIS — M05471 Rheumatoid myopathy with rheumatoid arthritis of right ankle and foot: Secondary | ICD-10-CM | POA: Diagnosis not present

## 2019-09-10 DIAGNOSIS — I2510 Atherosclerotic heart disease of native coronary artery without angina pectoris: Secondary | ICD-10-CM | POA: Diagnosis not present

## 2019-09-10 DIAGNOSIS — I739 Peripheral vascular disease, unspecified: Secondary | ICD-10-CM | POA: Diagnosis not present

## 2019-09-10 DIAGNOSIS — N183 Chronic kidney disease, stage 3 unspecified: Secondary | ICD-10-CM | POA: Diagnosis not present

## 2019-09-10 DIAGNOSIS — I5042 Chronic combined systolic (congestive) and diastolic (congestive) heart failure: Secondary | ICD-10-CM | POA: Diagnosis not present

## 2019-09-10 DIAGNOSIS — M158 Other polyosteoarthritis: Secondary | ICD-10-CM | POA: Diagnosis not present

## 2019-09-10 DIAGNOSIS — G809 Cerebral palsy, unspecified: Secondary | ICD-10-CM | POA: Diagnosis not present

## 2019-09-17 DIAGNOSIS — I130 Hypertensive heart and chronic kidney disease with heart failure and stage 1 through stage 4 chronic kidney disease, or unspecified chronic kidney disease: Secondary | ICD-10-CM | POA: Diagnosis not present

## 2019-09-17 DIAGNOSIS — M158 Other polyosteoarthritis: Secondary | ICD-10-CM | POA: Diagnosis not present

## 2019-09-17 DIAGNOSIS — N183 Chronic kidney disease, stage 3 unspecified: Secondary | ICD-10-CM | POA: Diagnosis not present

## 2019-09-17 DIAGNOSIS — I739 Peripheral vascular disease, unspecified: Secondary | ICD-10-CM | POA: Diagnosis not present

## 2019-09-17 DIAGNOSIS — I2510 Atherosclerotic heart disease of native coronary artery without angina pectoris: Secondary | ICD-10-CM | POA: Diagnosis not present

## 2019-09-17 DIAGNOSIS — G809 Cerebral palsy, unspecified: Secondary | ICD-10-CM | POA: Diagnosis not present

## 2019-09-17 DIAGNOSIS — I5042 Chronic combined systolic (congestive) and diastolic (congestive) heart failure: Secondary | ICD-10-CM | POA: Diagnosis not present

## 2019-09-17 DIAGNOSIS — G8929 Other chronic pain: Secondary | ICD-10-CM | POA: Diagnosis not present

## 2019-09-17 DIAGNOSIS — M05471 Rheumatoid myopathy with rheumatoid arthritis of right ankle and foot: Secondary | ICD-10-CM | POA: Diagnosis not present

## 2019-09-18 ENCOUNTER — Telehealth: Payer: Self-pay

## 2019-09-18 NOTE — Telephone Encounter (Signed)
Called pt to schedule her telephonic AWV and pt was getting ready to leave. Pt requested a CB on Monday 09/22/19 after lunch. Note made.

## 2019-09-19 ENCOUNTER — Encounter: Payer: Self-pay | Admitting: Family Medicine

## 2019-09-19 ENCOUNTER — Ambulatory Visit (INDEPENDENT_AMBULATORY_CARE_PROVIDER_SITE_OTHER): Payer: Medicare HMO | Admitting: Family Medicine

## 2019-09-19 ENCOUNTER — Telehealth: Payer: Self-pay

## 2019-09-19 ENCOUNTER — Ambulatory Visit
Admission: RE | Admit: 2019-09-19 | Discharge: 2019-09-19 | Disposition: A | Discharge status: Home / Self Care | Payer: Medicare HMO | Source: Ambulatory Visit | Attending: Family Medicine | Admitting: Family Medicine

## 2019-09-19 ENCOUNTER — Ambulatory Visit: Admission: RE | Admit: 2019-09-19 | Payer: Medicare HMO | Source: Ambulatory Visit | Admitting: *Deleted

## 2019-09-19 ENCOUNTER — Other Ambulatory Visit: Payer: Self-pay

## 2019-09-19 VITALS — BP 122/79 | HR 66

## 2019-09-19 DIAGNOSIS — M79642 Pain in left hand: Secondary | ICD-10-CM | POA: Insufficient documentation

## 2019-09-19 NOTE — Progress Notes (Signed)
Patient: Tina Salinas Female    DOB: [birth year]   74 y.o.   MRN: [id number] Visit Date: 09/19/2019  Today's Provider: Lelon Huh, MD   Chief Complaint  Patient presents with  . Hand Pain   Subjective:     Hand Pain  The incident occurred 12 to 24 hours ago. The injury mechanism was a fall. The pain is present in the left hand and left fingers. The quality of the pain is described as aching. The pain does not radiate. The pain is at a severity of 6/10. The symptoms are aggravated by movement. She has tried acetaminophen for the symptoms. The treatment provided no relief.    Allergies  Allergen Reactions  . Etodolac     Other reaction(s): Unknown  . Sulfa Antibiotics   . Tetracycline      Current Outpatient Medications:  .  acyclovir (ZOVIRAX) 800 MG tablet, Take 1 tablet (800 mg total) by mouth 3 (three) times daily as needed., Disp: 270 tablet, Rfl: 2 .  alendronate (FOSAMAX) 70 MG tablet, Take 70 mg by mouth once a week. , Disp: , Rfl:  .  ALPRAZolam (XANAX) 0.25 MG tablet, TAKE ONE TABLET BY MOUTH AT BEDTIME AS NEEDED FOR SLEEP, Disp: 90 tablet, Rfl: 3 .  amiodarone (PACERONE) 200 MG tablet, Take 200 mg by mouth daily., Disp: , Rfl:  .  aspirin 81 MG tablet, Take 81 mg by mouth daily., Disp: , Rfl:  .  betamethasone valerate ointment (VALISONE) 0.1 %, Apply topically., Disp: , Rfl:  .  Calcium Carbonate Antacid (CALCIUM CARBONATE PO), Take by mouth daily., Disp: , Rfl:  .  carvedilol (COREG) 3.125 MG tablet, Take 1 tablet (3.125 mg total) by mouth 2 (two) times daily., Disp: 60 tablet, Rfl: 3 .  cholecalciferol (VITAMIN D) 1000 UNITS tablet, Take 1,000 Units by mouth daily. , Disp: , Rfl:  .  collagenase (SANTYL) ointment, Apply 1 application topically daily., Disp: 90 g, Rfl: 3 .  cyclobenzaprine (FLEXERIL) 5 MG tablet, Take 1 tablet (5 mg total) by mouth 3 (three) times daily as needed for muscle spasms., Disp: 60 tablet, Rfl: 1 .  cyproheptadine (PERIACTIN) 4 MG  tablet, Take 4 mg by mouth 3 (three) times daily as needed for allergies., Disp: , Rfl:  .  fluconazole (DIFLUCAN) 150 MG tablet, TAKE ONE TABLET BY MOUTH ONCE FOR ONE DOSE, Disp: 1 tablet, Rfl: 0 .  fluticasone (FLONASE) 50 MCG/ACT nasal spray, Place 2 sprays into both nostrils daily., Disp: 16 g, Rfl: 6 .  folic acid (FOLVITE) 1 MG tablet, Take 1 mg by mouth daily., Disp: , Rfl:  .  furosemide (LASIX) 20 MG tablet, Take 1 tablet (20 mg total) by mouth daily., Disp: 30 tablet, Rfl: 1 .  HYDROcodone-acetaminophen (NORCO) 7.5-325 MG tablet, Take 2 tablets by mouth 3 (three) times daily as needed for moderate pain., Disp: 540 tablet, Rfl: 0 .  hydroxychloroquine (PLAQUENIL) 200 MG tablet, Take 400 mg by mouth daily. , Disp: , Rfl:  .  ibuprofen (ADVIL) 800 MG tablet, Take 800 mg by mouth every 8 (eight) hours as needed., Disp: , Rfl:  .  latanoprost (XALATAN) 0.005 % ophthalmic solution, Place 1 drop into both eyes at bedtime., Disp: , Rfl:  .  Methotrexate Sodium (METHOTREXATE, PF,) 50 MG/2ML injection, , Disp: , Rfl:  .  Methotrexate, PF, 25 MG/0.5ML SOAJ, Inject 1.4 mLs into the skin once a week., Disp: , Rfl:  .  Methylcellulose,  Laxative, (GNP FIBER THERAPY PO), Take 1 Dose by mouth daily as needed (constipation). , Disp: , Rfl:  .  mirtazapine (REMERON) 30 MG tablet, Take 1 tablet (30 mg total) by mouth at bedtime., Disp: 30 tablet, Rfl: 2 .  oxybutynin (DITROPAN) 5 MG tablet, TAKE 1 TABLET TWICE DAILY AS NEEDED  FOR  BLADDER  SPASMS, Disp: 180 tablet, Rfl: 4 .  pravastatin (PRAVACHOL) 40 MG tablet, Take 1 tablet (40 mg total) by mouth at bedtime., Disp: 30 tablet, Rfl: 12 .  predniSONE (DELTASONE) 2.5 MG tablet, Take 2.5 mg by mouth daily with breakfast., Disp: , Rfl:  .  promethazine (PHENERGAN) 25 MG tablet, Take 25 mg by mouth every 6 (six) hours as needed for nausea or vomiting., Disp: , Rfl:   Review of Systems  Constitutional: Negative.   Respiratory: Negative.   Cardiovascular:  Negative.   Musculoskeletal: Negative.     Social History   Tobacco Use  . Smoking status: Never Smoker  . Smokeless tobacco: Never Used  Substance Use Topics  . Alcohol use: No      Objective:   BP 122/79 (BP Location: Right Arm, Patient Position: Sitting, Cuff Size: Normal)   Pulse 66  There were no vitals filed for this visit.There is no height or weight on file to calculate BMI.   Physical Exam  Mild tenderness across MCPs and PIPs      Assessment & Plan    1. Left hand pain  - DG Hand Complete Left; Future    The entirety of the information documented in the History of Present Illness, Review of Systems and Physical Exam were personally obtained by me. Portions of this information were initially documented by Elonda Husky, CMA and reviewed by me for thoroughness and accuracy.     Lelon Huh, MD  [facility]

## 2019-09-19 NOTE — Telephone Encounter (Signed)
Attempted to contact patient busy signal. Patient has a appointment this afternoon with Dr. Caryn Section for left hand pain.

## 2019-09-19 NOTE — Telephone Encounter (Signed)
Copied from [place] [phone]. Topic: General - Inquiry >> Sep 19, 2019 10:11 AM Lennox Solders wrote: Reason for CRM:pt has an appt this afternoon with dr Caryn Section and requesting dr Caryn Section nurse to return her call concerning her hand.

## 2019-09-22 ENCOUNTER — Telehealth: Payer: Self-pay

## 2019-09-22 NOTE — Telephone Encounter (Signed)
Pt given results per Dr Lelon Huh; she verbalized understanding

## 2019-09-22 NOTE — Telephone Encounter (Signed)
Attempted to contact patient, no answer and voicemail is full. If patient returns call okay for PEC to advise.

## 2019-09-22 NOTE — Telephone Encounter (Signed)
Attempted to  call patient call patient.  Nom answer , voice mail box full.

## 2019-09-22 NOTE — Telephone Encounter (Signed)
-----   Message from Birdie Sons, MD sent at 09/20/2019  8:32 PM EST ----- Xray is negative, no sign of fracture She likely sprained hand and should continue wearing the brace for 3-4 weeks.

## 2019-09-23 NOTE — Telephone Encounter (Signed)
Called pt as advised last week and tried to schedule her AWV again and pt declined and stated that "she did not have time to deal with me." Pt was upset about the call. Will not CB to schedule an AWV.

## 2019-09-26 DIAGNOSIS — I130 Hypertensive heart and chronic kidney disease with heart failure and stage 1 through stage 4 chronic kidney disease, or unspecified chronic kidney disease: Secondary | ICD-10-CM | POA: Diagnosis not present

## 2019-09-26 DIAGNOSIS — I5042 Chronic combined systolic (congestive) and diastolic (congestive) heart failure: Secondary | ICD-10-CM | POA: Diagnosis not present

## 2019-09-26 DIAGNOSIS — I2510 Atherosclerotic heart disease of native coronary artery without angina pectoris: Secondary | ICD-10-CM | POA: Diagnosis not present

## 2019-09-26 DIAGNOSIS — M158 Other polyosteoarthritis: Secondary | ICD-10-CM | POA: Diagnosis not present

## 2019-09-26 DIAGNOSIS — I739 Peripheral vascular disease, unspecified: Secondary | ICD-10-CM | POA: Diagnosis not present

## 2019-09-26 DIAGNOSIS — G809 Cerebral palsy, unspecified: Secondary | ICD-10-CM | POA: Diagnosis not present

## 2019-09-26 DIAGNOSIS — G8929 Other chronic pain: Secondary | ICD-10-CM | POA: Diagnosis not present

## 2019-09-26 DIAGNOSIS — M05471 Rheumatoid myopathy with rheumatoid arthritis of right ankle and foot: Secondary | ICD-10-CM | POA: Diagnosis not present

## 2019-09-26 DIAGNOSIS — N183 Chronic kidney disease, stage 3 unspecified: Secondary | ICD-10-CM | POA: Diagnosis not present

## 2019-10-02 ENCOUNTER — Observation Stay
Admit: 2019-10-02 | Discharge: 2019-10-02 | Disposition: A | Discharge status: Home / Self Care | Payer: Medicare HMO | Attending: Internal Medicine | Admitting: Internal Medicine

## 2019-10-02 ENCOUNTER — Inpatient Hospital Stay
Admission: EM | Admit: 2019-10-02 | Discharge: 2019-10-11 | DRG: 291 | Disposition: A | Discharge status: Skilled Nursing Facility | Payer: Medicare HMO | Attending: Internal Medicine | Admitting: Internal Medicine

## 2019-10-02 ENCOUNTER — Emergency Department: Payer: Medicare HMO

## 2019-10-02 ENCOUNTER — Observation Stay: Payer: Medicare HMO

## 2019-10-02 ENCOUNTER — Encounter: Payer: Self-pay | Admitting: *Deleted

## 2019-10-02 ENCOUNTER — Other Ambulatory Visit: Payer: Self-pay

## 2019-10-02 DIAGNOSIS — I110 Hypertensive heart disease with heart failure: Secondary | ICD-10-CM | POA: Diagnosis not present

## 2019-10-02 DIAGNOSIS — R778 Other specified abnormalities of plasma proteins: Secondary | ICD-10-CM

## 2019-10-02 DIAGNOSIS — E559 Vitamin D deficiency, unspecified: Secondary | ICD-10-CM | POA: Diagnosis present

## 2019-10-02 DIAGNOSIS — G809 Cerebral palsy, unspecified: Secondary | ICD-10-CM | POA: Diagnosis not present

## 2019-10-02 DIAGNOSIS — E538 Deficiency of other specified B group vitamins: Secondary | ICD-10-CM | POA: Diagnosis present

## 2019-10-02 DIAGNOSIS — Z79899 Other long term (current) drug therapy: Secondary | ICD-10-CM

## 2019-10-02 DIAGNOSIS — I509 Heart failure, unspecified: Secondary | ICD-10-CM | POA: Diagnosis not present

## 2019-10-02 DIAGNOSIS — N1831 Chronic kidney disease, stage 3a: Secondary | ICD-10-CM | POA: Diagnosis present

## 2019-10-02 DIAGNOSIS — J189 Pneumonia, unspecified organism: Secondary | ICD-10-CM | POA: Diagnosis not present

## 2019-10-02 DIAGNOSIS — Z20822 Contact with and (suspected) exposure to covid-19: Secondary | ICD-10-CM | POA: Diagnosis present

## 2019-10-02 DIAGNOSIS — M6281 Muscle weakness (generalized): Secondary | ICD-10-CM | POA: Diagnosis not present

## 2019-10-02 DIAGNOSIS — J9811 Atelectasis: Secondary | ICD-10-CM | POA: Diagnosis not present

## 2019-10-02 DIAGNOSIS — E43 Unspecified severe protein-calorie malnutrition: Secondary | ICD-10-CM | POA: Diagnosis present

## 2019-10-02 DIAGNOSIS — Z7952 Long term (current) use of systemic steroids: Secondary | ICD-10-CM

## 2019-10-02 DIAGNOSIS — N390 Urinary tract infection, site not specified: Secondary | ICD-10-CM | POA: Diagnosis not present

## 2019-10-02 DIAGNOSIS — I480 Paroxysmal atrial fibrillation: Secondary | ICD-10-CM | POA: Diagnosis present

## 2019-10-02 DIAGNOSIS — D7589 Other specified diseases of blood and blood-forming organs: Secondary | ICD-10-CM | POA: Diagnosis present

## 2019-10-02 DIAGNOSIS — I10 Essential (primary) hypertension: Secondary | ICD-10-CM | POA: Diagnosis present

## 2019-10-02 DIAGNOSIS — I248 Other forms of acute ischemic heart disease: Secondary | ICD-10-CM | POA: Diagnosis present

## 2019-10-02 DIAGNOSIS — I130 Hypertensive heart and chronic kidney disease with heart failure and stage 1 through stage 4 chronic kidney disease, or unspecified chronic kidney disease: Principal | ICD-10-CM | POA: Diagnosis present

## 2019-10-02 DIAGNOSIS — J9601 Acute respiratory failure with hypoxia: Secondary | ICD-10-CM | POA: Diagnosis not present

## 2019-10-02 DIAGNOSIS — I2510 Atherosclerotic heart disease of native coronary artery without angina pectoris: Secondary | ICD-10-CM | POA: Diagnosis present

## 2019-10-02 DIAGNOSIS — R748 Abnormal levels of other serum enzymes: Secondary | ICD-10-CM

## 2019-10-02 DIAGNOSIS — M810 Age-related osteoporosis without current pathological fracture: Secondary | ICD-10-CM | POA: Diagnosis present

## 2019-10-02 DIAGNOSIS — R64 Cachexia: Secondary | ICD-10-CM | POA: Diagnosis present

## 2019-10-02 DIAGNOSIS — Z881 Allergy status to other antibiotic agents status: Secondary | ICD-10-CM

## 2019-10-02 DIAGNOSIS — I428 Other cardiomyopathies: Secondary | ICD-10-CM | POA: Diagnosis present

## 2019-10-02 DIAGNOSIS — Z7983 Long term (current) use of bisphosphonates: Secondary | ICD-10-CM

## 2019-10-02 DIAGNOSIS — R498 Other voice and resonance disorders: Secondary | ICD-10-CM | POA: Diagnosis not present

## 2019-10-02 DIAGNOSIS — G9341 Metabolic encephalopathy: Secondary | ICD-10-CM | POA: Diagnosis not present

## 2019-10-02 DIAGNOSIS — Z681 Body mass index (BMI) 19 or less, adult: Secondary | ICD-10-CM | POA: Diagnosis not present

## 2019-10-02 DIAGNOSIS — R946 Abnormal results of thyroid function studies: Secondary | ICD-10-CM | POA: Diagnosis present

## 2019-10-02 DIAGNOSIS — Z882 Allergy status to sulfonamides status: Secondary | ICD-10-CM

## 2019-10-02 DIAGNOSIS — F419 Anxiety disorder, unspecified: Secondary | ICD-10-CM | POA: Diagnosis not present

## 2019-10-02 DIAGNOSIS — J3089 Other allergic rhinitis: Secondary | ICD-10-CM | POA: Diagnosis not present

## 2019-10-02 DIAGNOSIS — M059 Rheumatoid arthritis with rheumatoid factor, unspecified: Secondary | ICD-10-CM | POA: Diagnosis present

## 2019-10-02 DIAGNOSIS — I5043 Acute on chronic combined systolic (congestive) and diastolic (congestive) heart failure: Secondary | ICD-10-CM

## 2019-10-02 DIAGNOSIS — I5042 Chronic combined systolic (congestive) and diastolic (congestive) heart failure: Secondary | ICD-10-CM | POA: Diagnosis not present

## 2019-10-02 DIAGNOSIS — R0602 Shortness of breath: Secondary | ICD-10-CM | POA: Diagnosis not present

## 2019-10-02 DIAGNOSIS — Z888 Allergy status to other drugs, medicaments and biological substances status: Secondary | ICD-10-CM

## 2019-10-02 DIAGNOSIS — I5021 Acute systolic (congestive) heart failure: Secondary | ICD-10-CM | POA: Diagnosis not present

## 2019-10-02 DIAGNOSIS — R05 Cough: Secondary | ICD-10-CM | POA: Diagnosis not present

## 2019-10-02 DIAGNOSIS — R7401 Elevation of levels of liver transaminase levels: Secondary | ICD-10-CM | POA: Diagnosis present

## 2019-10-02 DIAGNOSIS — Z9181 History of falling: Secondary | ICD-10-CM

## 2019-10-02 DIAGNOSIS — I4891 Unspecified atrial fibrillation: Secondary | ICD-10-CM | POA: Diagnosis not present

## 2019-10-02 DIAGNOSIS — R488 Other symbolic dysfunctions: Secondary | ICD-10-CM | POA: Diagnosis not present

## 2019-10-02 DIAGNOSIS — B954 Other streptococcus as the cause of diseases classified elsewhere: Secondary | ICD-10-CM | POA: Diagnosis present

## 2019-10-02 DIAGNOSIS — E785 Hyperlipidemia, unspecified: Secondary | ICD-10-CM | POA: Diagnosis not present

## 2019-10-02 DIAGNOSIS — Z7982 Long term (current) use of aspirin: Secondary | ICD-10-CM

## 2019-10-02 DIAGNOSIS — G9340 Encephalopathy, unspecified: Secondary | ICD-10-CM | POA: Diagnosis not present

## 2019-10-02 DIAGNOSIS — M2550 Pain in unspecified joint: Secondary | ICD-10-CM | POA: Diagnosis not present

## 2019-10-02 DIAGNOSIS — E876 Hypokalemia: Secondary | ICD-10-CM | POA: Diagnosis present

## 2019-10-02 DIAGNOSIS — J90 Pleural effusion, not elsewhere classified: Secondary | ICD-10-CM | POA: Diagnosis not present

## 2019-10-02 DIAGNOSIS — Z7401 Bed confinement status: Secondary | ICD-10-CM | POA: Diagnosis not present

## 2019-10-02 DIAGNOSIS — N183 Chronic kidney disease, stage 3 unspecified: Secondary | ICD-10-CM | POA: Diagnosis present

## 2019-10-02 LAB — CBC WITH DIFFERENTIAL/PLATELET
Abs Immature Granulocytes: 0.09 10*3/uL — ABNORMAL HIGH (ref 0.00–0.07)
Basophils Absolute: 0 10*3/uL (ref 0.0–0.1)
Basophils Relative: 0 %
Eosinophils Absolute: 0 10*3/uL (ref 0.0–0.5)
Eosinophils Relative: 0 %
HCT: 36.7 % (ref 36.0–46.0)
Hemoglobin: 11.2 g/dL — ABNORMAL LOW (ref 12.0–15.0)
Immature Granulocytes: 1 %
Lymphocytes Relative: 9 %
Lymphs Abs: 1 10*3/uL (ref 0.7–4.0)
MCH: 31.7 pg (ref 26.0–34.0)
MCHC: 30.5 g/dL (ref 30.0–36.0)
MCV: 104 fL — ABNORMAL HIGH (ref 80.0–100.0)
Monocytes Absolute: 0.6 10*3/uL (ref 0.1–1.0)
Monocytes Relative: 5 %
Neutro Abs: 10.1 10*3/uL — ABNORMAL HIGH (ref 1.7–7.7)
Neutrophils Relative %: 85 %
Platelets: 269 10*3/uL (ref 150–400)
RBC: 3.53 MIL/uL — ABNORMAL LOW (ref 3.87–5.11)
RDW: 14.4 % (ref 11.5–15.5)
WBC: 11.9 10*3/uL — ABNORMAL HIGH (ref 4.0–10.5)
nRBC: 0 % (ref 0.0–0.2)

## 2019-10-02 LAB — COMPREHENSIVE METABOLIC PANEL
ALT: 21 U/L (ref 0–44)
AST: 45 U/L — ABNORMAL HIGH (ref 15–41)
Albumin: 3.2 g/dL — ABNORMAL LOW (ref 3.5–5.0)
Alkaline Phosphatase: 108 U/L (ref 38–126)
Anion gap: 9 (ref 5–15)
BUN: 17 mg/dL (ref 8–23)
CO2: 23 mmol/L (ref 22–32)
Calcium: 8.1 mg/dL — ABNORMAL LOW (ref 8.9–10.3)
Chloride: 107 mmol/L (ref 98–111)
Creatinine, Ser: 1.14 mg/dL — ABNORMAL HIGH (ref 0.44–1.00)
GFR calc Af Amer: 56 mL/min — ABNORMAL LOW (ref >60)
GFR calc non Af Amer: 48 mL/min — ABNORMAL LOW (ref >60)
Glucose, Bld: 121 mg/dL — ABNORMAL HIGH (ref 70–99)
Potassium: 3.5 mmol/L (ref 3.5–5.1)
Sodium: 139 mmol/L (ref 135–145)
Total Bilirubin: 2 mg/dL — ABNORMAL HIGH (ref 0.3–1.2)
Total Protein: 6.7 g/dL (ref 6.5–8.1)

## 2019-10-02 LAB — TROPONIN I (HIGH SENSITIVITY)
Troponin I (High Sensitivity): 68 ng/L — ABNORMAL HIGH (ref <18)
Troponin I (High Sensitivity): 79 ng/L — ABNORMAL HIGH (ref <18)
Troponin I (High Sensitivity): 72 ng/L — ABNORMAL HIGH (ref <18)

## 2019-10-02 LAB — TSH: TSH: 5.203 u[IU]/mL — ABNORMAL HIGH (ref 0.350–4.500)

## 2019-10-02 LAB — BRAIN NATRIURETIC PEPTIDE: B Natriuretic Peptide: 2680 pg/mL — ABNORMAL HIGH (ref 0.0–100.0)

## 2019-10-02 LAB — ECHOCARDIOGRAM COMPLETE
Height: 61 in
Weight: 1648 oz

## 2019-10-02 LAB — RESPIRATORY PANEL BY RT PCR (FLU A&B, COVID)
Influenza A by PCR: NEGATIVE
Influenza B by PCR: NEGATIVE
SARS Coronavirus 2 by RT PCR: NEGATIVE

## 2019-10-02 LAB — PROCALCITONIN: Procalcitonin: 0.25 ng/mL

## 2019-10-02 LAB — T4, FREE: Free T4: 1.57 ng/dL — ABNORMAL HIGH (ref 0.61–1.12)

## 2019-10-02 MED ORDER — HYDROCODONE-ACETAMINOPHEN 7.5-325 MG PO TABS
2.0000 | ORAL_TABLET | Freq: Three times a day (TID) | ORAL | Status: DC | PRN
  Administered 2019-10-03 – 2019-10-05 (×5): 2 via ORAL
  Filled 2019-10-02 (×6): qty 2

## 2019-10-02 MED ORDER — SODIUM CHLORIDE 0.9% FLUSH: 3.0000 mL | INTRAVENOUS | Status: DC | PRN

## 2019-10-02 MED ORDER — CYCLOBENZAPRINE HCL 10 MG PO TABS
5.0000 mg | ORAL_TABLET | Freq: Three times a day (TID) | ORAL | Status: DC | PRN
  Administered 2019-10-02 – 2019-10-05 (×3): 5 mg via ORAL
  Filled 2019-10-02 (×3): qty 1

## 2019-10-02 MED ORDER — COLLAGENASE 250 UNIT/GM EX OINT
1.0000 "application " | TOPICAL_OINTMENT | Freq: Every day | CUTANEOUS | Status: DC
  Administered 2019-10-06 – 2019-10-11 (×5): 1 via TOPICAL
  Filled 2019-10-02: qty 30

## 2019-10-02 MED ORDER — LATANOPROST 0.005 % OP SOLN
1.0000 [drp] | Freq: Every day | OPHTHALMIC | Status: DC
  Administered 2019-10-02 – 2019-10-10 (×8): 1 [drp] via OPHTHALMIC
  Filled 2019-10-02: qty 2.5

## 2019-10-02 MED ORDER — CYPROHEPTADINE HCL 4 MG PO TABS
4.0000 mg | ORAL_TABLET | Freq: Three times a day (TID) | ORAL | Status: DC | PRN
  Filled 2019-10-02: qty 1

## 2019-10-02 MED ORDER — CARVEDILOL 3.125 MG PO TABS
3.1250 mg | ORAL_TABLET | Freq: Two times a day (BID) | ORAL | Status: DC
  Administered 2019-10-02 (×2): 3.125 mg via ORAL
  Filled 2019-10-02 (×2): qty 1

## 2019-10-02 MED ORDER — VITAMIN D 25 MCG (1000 UNIT) PO TABS
1000.0000 [IU] | ORAL_TABLET | Freq: Every day | ORAL | Status: DC
  Administered 2019-10-02 – 2019-10-11 (×10): 1000 [IU] via ORAL
  Filled 2019-10-02 (×11): qty 1

## 2019-10-02 MED ORDER — GUAIFENESIN ER 600 MG PO TB12
600.0000 mg | ORAL_TABLET | Freq: Two times a day (BID) | ORAL | Status: DC
  Administered 2019-10-02 – 2019-10-11 (×17): 600 mg via ORAL
  Filled 2019-10-02 (×18): qty 1

## 2019-10-02 MED ORDER — FUROSEMIDE 10 MG/ML IJ SOLN
40.0000 mg | Freq: Two times a day (BID) | INTRAMUSCULAR | Status: DC
  Administered 2019-10-02: 40 mg via INTRAVENOUS
  Filled 2019-10-02: qty 4

## 2019-10-02 MED ORDER — GUAIFENESIN-DM 100-10 MG/5ML PO SYRP
10.0000 mL | ORAL_SOLUTION | Freq: Four times a day (QID) | ORAL | Status: DC | PRN
  Administered 2019-10-06 – 2019-10-07 (×2): 10 mL via ORAL
  Filled 2019-10-02 (×3): qty 10

## 2019-10-02 MED ORDER — ACETAMINOPHEN 325 MG PO TABS
650.0000 mg | ORAL_TABLET | ORAL | Status: DC | PRN
  Administered 2019-10-02 – 2019-10-03 (×3): 650 mg via ORAL
  Filled 2019-10-02 (×5): qty 2

## 2019-10-02 MED ORDER — FOLIC ACID 1 MG PO TABS
1.0000 mg | ORAL_TABLET | Freq: Every day | ORAL | Status: DC
  Administered 2019-10-02 – 2019-10-11 (×10): 1 mg via ORAL
  Filled 2019-10-02 (×11): qty 1

## 2019-10-02 MED ORDER — ALENDRONATE SODIUM 70 MG PO TABS: 70.0000 mg | ORAL_TABLET | ORAL | Status: DC

## 2019-10-02 MED ORDER — ENOXAPARIN SODIUM 40 MG/0.4ML ~~LOC~~ SOLN
40.0000 mg | SUBCUTANEOUS | Status: DC
  Administered 2019-10-02 – 2019-10-09 (×8): 40 mg via SUBCUTANEOUS
  Filled 2019-10-02 (×9): qty 0.4

## 2019-10-02 MED ORDER — IPRATROPIUM-ALBUTEROL 0.5-2.5 (3) MG/3ML IN SOLN: 3.0000 mL | Freq: Four times a day (QID) | RESPIRATORY_TRACT | Status: DC | PRN

## 2019-10-02 MED ORDER — ALPRAZOLAM 0.25 MG PO TABS
0.2500 mg | ORAL_TABLET | Freq: Every evening | ORAL | Status: DC | PRN
  Administered 2019-10-06: 0.25 mg via ORAL
  Filled 2019-10-02: qty 1

## 2019-10-02 MED ORDER — OXYBUTYNIN CHLORIDE 5 MG PO TABS
2.5000 mg | ORAL_TABLET | Freq: Two times a day (BID) | ORAL | Status: DC | PRN
  Administered 2019-10-06: 2.5 mg via ORAL
  Filled 2019-10-02 (×2): qty 0.5

## 2019-10-02 MED ORDER — AMIODARONE HCL 200 MG PO TABS
200.0000 mg | ORAL_TABLET | Freq: Every day | ORAL | Status: DC
  Administered 2019-10-02 – 2019-10-11 (×9): 200 mg via ORAL
  Filled 2019-10-02 (×11): qty 1

## 2019-10-02 MED ORDER — PSYLLIUM 95 % PO PACK
PACK | Freq: Every day | ORAL | Status: DC | PRN
  Filled 2019-10-02: qty 1

## 2019-10-02 MED ORDER — ASPIRIN EC 81 MG PO TBEC
81.0000 mg | DELAYED_RELEASE_TABLET | Freq: Every day | ORAL | Status: DC
  Administered 2019-10-02 – 2019-10-10 (×8): 81 mg via ORAL
  Filled 2019-10-02 (×9): qty 1

## 2019-10-02 MED ORDER — HYDROXYCHLOROQUINE SULFATE 200 MG PO TABS
400.0000 mg | ORAL_TABLET | Freq: Every day | ORAL | Status: DC
  Administered 2019-10-02 – 2019-10-11 (×10): 400 mg via ORAL
  Filled 2019-10-02 (×10): qty 2

## 2019-10-02 MED ORDER — FUROSEMIDE 10 MG/ML IJ SOLN
20.0000 mg | Freq: Two times a day (BID) | INTRAMUSCULAR | Status: DC
  Administered 2019-10-03: 20 mg via INTRAVENOUS
  Filled 2019-10-02: qty 2

## 2019-10-02 MED ORDER — ENSURE ENLIVE PO LIQD
1.0000 | Freq: Three times a day (TID) | ORAL | Status: DC
  Administered 2019-10-02 – 2019-10-10 (×15): 237 mL via ORAL

## 2019-10-02 MED ORDER — SODIUM CHLORIDE 0.9 % IV SOLN
250.0000 mL | INTRAVENOUS | Status: DC | PRN
  Administered 2019-10-02 – 2019-10-04 (×2): 250 mL via INTRAVENOUS

## 2019-10-02 MED ORDER — ORAL CARE MOUTH RINSE
15.0000 mL | Freq: Two times a day (BID) | OROMUCOSAL | Status: DC
  Administered 2019-10-02 – 2019-10-11 (×9): 15 mL via OROMUCOSAL

## 2019-10-02 MED ORDER — SODIUM CHLORIDE 0.9% FLUSH
3.0000 mL | Freq: Two times a day (BID) | INTRAVENOUS | Status: DC
  Administered 2019-10-02 – 2019-10-11 (×16): 3 mL via INTRAVENOUS

## 2019-10-02 MED ORDER — LEVOFLOXACIN IN D5W 750 MG/150ML IV SOLN
750.0000 mg | INTRAVENOUS | Status: DC
  Administered 2019-10-02: 750 mg via INTRAVENOUS
  Filled 2019-10-02 (×2): qty 150

## 2019-10-02 MED ORDER — PRAVASTATIN SODIUM 40 MG PO TABS
40.0000 mg | ORAL_TABLET | Freq: Every day | ORAL | Status: DC
  Administered 2019-10-02 – 2019-10-10 (×8): 40 mg via ORAL
  Filled 2019-10-02 (×10): qty 1

## 2019-10-02 MED ORDER — ONDANSETRON HCL 4 MG/2ML IJ SOLN
4.0000 mg | Freq: Four times a day (QID) | INTRAMUSCULAR | Status: DC | PRN
  Administered 2019-10-07: 4 mg via INTRAVENOUS
  Filled 2019-10-02: qty 2

## 2019-10-02 MED ORDER — MIRTAZAPINE 15 MG PO TABS
30.0000 mg | ORAL_TABLET | Freq: Every day | ORAL | Status: DC
  Administered 2019-10-02 – 2019-10-10 (×8): 30 mg via ORAL
  Filled 2019-10-02 (×9): qty 2

## 2019-10-02 MED ORDER — FUROSEMIDE 10 MG/ML IJ SOLN
20.0000 mg | Freq: Once | INTRAMUSCULAR | Status: AC
  Administered 2019-10-02: 04:00:00 20 mg via INTRAVENOUS
  Filled 2019-10-02: qty 4

## 2019-10-02 MED ORDER — CALCIUM CARBONATE 1250 (500 CA) MG PO TABS
1250.0000 mg | ORAL_TABLET | Freq: Every day | ORAL | Status: DC
  Administered 2019-10-02 – 2019-10-11 (×8): 1250 mg via ORAL
  Filled 2019-10-02 (×12): qty 1

## 2019-10-02 MED ORDER — PREDNISONE 5 MG PO TABS
2.5000 mg | ORAL_TABLET | Freq: Every day | ORAL | Status: DC
  Administered 2019-10-02 – 2019-10-11 (×10): 2.5 mg via ORAL
  Filled 2019-10-02 (×11): qty 0.5

## 2019-10-02 NOTE — Progress Notes (Signed)
PHARMACIST - PHYSICIAN COMMUNICATION  CONCERNING: P&T Medication Policy Regarding Oral Bisphosphonates  RECOMMENDATION: Your order for alendronate (Fosamax), ibandronate (Boniva), or risedronate (Actonel) has been discontinued at this time.  If the patient's post-hospital medical condition warrants safe use of this class of drugs, please resume the pre-hospital regimen upon discharge.  DESCRIPTION:  Alendronate (Fosamax), ibandronate (Boniva), and risedronate (Actonel) can cause severe esophageal erosions in patients who are unable to remain upright at least 30 minutes after taking this medication.   Since brief interruptions in therapy are thought to have minimal impact on bone mineral density, the [place] has established that bisphosphonate orders should be routinely discontinued during hospitalization.   To override this safety policy and permit administration of Boniva, Fosamax, or Actonel in the hospital, prescribers must write "DO NOT HOLD" in the comments section when placing the order for this class of medications.  Paulina Fusi, PharmD, BCPS 10/02/2019 10:21 AM

## 2019-10-02 NOTE — ED Notes (Signed)
Medications due at this time currently not verified by pharmacy. Pharmacy contacted via messaging system to verify medications

## 2019-10-02 NOTE — ED Notes (Signed)
Pt given another warm blanket and room temp inc per pt request.

## 2019-10-02 NOTE — ED Notes (Signed)
This RN attempted to call report at this time.  

## 2019-10-02 NOTE — H&P (Signed)
History and Physical    Tina Salinas [id number] DOB: 01/03/1946 DOA: 10/02/2019  PCP: Birdie Sons, MD   Patient coming from: home I have personally briefly reviewed patient's old medical records in [place]  Chief Complaint: shortness of breath  HPI: Tina Salinas is a 74 y.o. 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 fib on amiodarone, not on anticoagulation due to fall risk, RA on methotrexate and chronic prednisone who presents to the emergency room with a 3-day history of feeling unwell with cough congestion and shortness of breath, all of which acutely worsened after she got her Covid shot on the day of arrival.  She denied chest pain, fever or chills.  Denied GI or GU symptoms  ED Course: On arrival in the emergency room O2 saturation was 7880% on room air requiring 5 L to maintain sats in the mid 90s.  She was afebrile with BP 134/76, HR 73 RR 22.  Her blood work significant for white cell count of 11,900, hemoglobin 11.2.  Creatinine 1.14 which is her baseline for CKD 3.  She was noted to have an elevated AST of 45 and total bili of 2.  Pro-Calc 0.25 troponin 68, BNP 2680, Hg normal sinus rhythm chest x-ray showing CHF/volume overload with pulmonary edema and bilateral effusions with small patchy airspace disease in the right mid to upper lung which could reflect developing asymmetric alveolar edema or pneumonia.  Patient was given a dose of Lasix.  Hospitalist consulted for admission  Review of Systems: As per HPI otherwise 10 point review of systems negative.    Past Medical History:  Diagnosis Date  . Adenoma   . CAP (community acquired pneumonia) 11/16/2017  . Cerebral palsy ([place])   . Femur fracture ([place])   . History of blood transfusion   . Hypertension   . Osteoarthritis   . Osteoporosis   . Rheumatoid arthritis [facility])     Past  Surgical History:  Procedure Laterality Date  . BREAST ENHANCEMENT SURGERY Bilateral 1980  . COLONOSCOPY    . COLONOSCOPY WITH PROPOFOL N/A 06/18/2019   Procedure: COLONOSCOPY WITH PROPOFOL;  Surgeon: Robert Bellow, MD;  Location: [facility] ENDOSCOPY;  Service: Endoscopy;  Laterality: N/A;  . CORONARY ANGIOPLASTY WITH STENT PLACEMENT  07/13/2008, 08/19/2008  . ESOPHAGOGASTRODUODENOSCOPY (EGD) WITH PROPOFOL N/A 06/18/2019   Procedure: ESOPHAGOGASTRODUODENOSCOPY (EGD) WITH PROPOFOL;  Surgeon: Robert Bellow, MD;  Location: [facility] ENDOSCOPY;  Service: Endoscopy;  Laterality: N/A;  . HIP ARTHROPLASTY N/A 2000  . JOINT REPLACEMENT    . LEFT HEART CATH AND CORONARY ANGIOGRAPHY N/A 05/16/2017   Procedure: LEFT HEART CATH AND CORONARY ANGIOGRAPHY;  Surgeon: Teodoro Spray, MD;  Location: [facility];  Service: Cardiovascular;  Laterality: N/A;  . TOTAL KNEE ARTHROPLASTY Left 09/2007, 01/2008     reports that she has never smoked. She has never used smokeless tobacco. She reports that she does not drink alcohol or use drugs.  Allergies  Allergen Reactions  . Etodolac     Other reaction(s): Unknown  . Sulfa Antibiotics   . Tetracycline     Family History  Problem Relation Age of Onset  . Alcohol abuse Father   . Raynaud syndrome Sister      Prior to Admission medications   Medication Sig Start Date End Date Taking? Authorizing Provider  acyclovir (ZOVIRAX) 800 MG tablet Take 1 tablet (  800 mg total) by mouth 3 (three) times daily as needed. 11/05/18   Birdie Sons, MD  alendronate (FOSAMAX) 70 MG tablet Take 70 mg by mouth once a week.  03/30/14   [provider]  ALPRAZolam Duanne Moron) 0.25 MG tablet TAKE ONE TABLET BY MOUTH AT BEDTIME AS NEEDED FOR SLEEP 03/25/19   Birdie Sons, MD  amiodarone (PACERONE) 200 MG tablet Take 200 mg by mouth daily.    [provider]  aspirin 81 MG tablet Take 81 mg by mouth daily.    [provider]  betamethasone  valerate ointment (VALISONE) 0.1 % Apply topically. 12/23/18 12/23/19  [provider]  Calcium Carbonate Antacid (CALCIUM CARBONATE PO) Take by mouth daily.    [provider]  carvedilol (COREG) 3.125 MG tablet Take 1 tablet (3.125 mg total) by mouth 2 (two) times daily. 01/07/18 04/07/18  Alisa Graff, FNP  cholecalciferol (VITAMIN D) 1000 UNITS tablet Take 1,000 Units by mouth daily.  04/25/10   [provider]  collagenase (SANTYL) ointment Apply 1 application topically daily. 10/01/18   Birdie Sons, MD  cyclobenzaprine (FLEXERIL) 5 MG tablet Take 1 tablet (5 mg total) by mouth 3 (three) times daily as needed for muscle spasms. 04/22/19   Birdie Sons, MD  cyproheptadine (PERIACTIN) 4 MG tablet Take 4 mg by mouth 3 (three) times daily as needed for allergies.    [provider]  fluconazole (DIFLUCAN) 150 MG tablet TAKE ONE TABLET BY MOUTH ONCE FOR ONE DOSE 02/06/19   Birdie Sons, MD  fluticasone [facility]) 50 MCG/ACT nasal spray Place 2 sprays into both nostrils daily. 12/07/17   Birdie Sons, MD  folic acid (FOLVITE) 1 MG tablet Take 1 mg by mouth daily.    [provider]  furosemide (LASIX) 20 MG tablet Take 1 tablet (20 mg total) by mouth daily. 11/22/17   Saundra Shelling, MD  HYDROcodone-acetaminophen (NORCO) 7.5-325 MG tablet Take 2 tablets by mouth 3 (three) times daily as needed for moderate pain. 08/25/19   Birdie Sons, MD  hydroxychloroquine (PLAQUENIL) 200 MG tablet Take 400 mg by mouth daily.     [provider]  ibuprofen (ADVIL) 800 MG tablet Take 800 mg by mouth every 8 (eight) hours as needed.    [provider]  latanoprost (XALATAN) 0.005 % ophthalmic solution Place 1 drop into both eyes at bedtime.    [provider]  Methotrexate Sodium (METHOTREXATE, PF,) 50 MG/2ML injection  06/06/19   [provider]  Methotrexate, PF, 25 MG/0.5ML SOAJ Inject 1.4 mLs into the skin once a week.     [provider]  Methylcellulose, Laxative, (GNP FIBER THERAPY PO) Take 1 Dose by mouth daily as needed (constipation).     [provider]  mirtazapine (REMERON) 30 MG tablet Take 1 tablet (30 mg total) by mouth at bedtime. 06/06/19   Birdie Sons, MD  oxybutynin (DITROPAN) 5 MG tablet TAKE 1 TABLET TWICE DAILY AS NEEDED  FOR  BLADDER  SPASMS 12/20/18   Birdie Sons, MD  pravastatin (PRAVACHOL) 40 MG tablet Take 1 tablet (40 mg total) by mouth at bedtime. 07/23/19   Birdie Sons, MD  predniSONE (DELTASONE) 2.5 MG tablet Take 2.5 mg by mouth daily with breakfast.    [provider]  promethazine (PHENERGAN) 25 MG tablet Take 25 mg by mouth every 6 (six) hours as needed for nausea or vomiting.    [provider]  Physical Exam: Vitals:   10/02/19 0330 10/02/19 0400 10/02/19 0430 10/02/19 0500  BP: 132/81 135/84 130/85 127/77  Pulse: 71 70 69 64  Resp: (!) 35 (!) 31 (!) 24 (!) 22  Temp:      TempSrc:      SpO2: 98% 95% 95% 98%  Weight:      Height:         Vitals:   10/02/19 0330 10/02/19 0400 10/02/19 0430 10/02/19 0500  BP: 132/81 135/84 130/85 127/77  Pulse: 71 70 69 64  Resp: (!) 35 (!) 31 (!) 24 (!) 22  Temp:      TempSrc:      SpO2: 98% 95% 95% 98%  Weight:      Height:        Constitutional: Alert and awake, oriented x3, not in any acute distress. Eyes: PERLA, EOMI, irises appear normal, anicteric sclera,  ENMT: external ears and nose appear normal, normal hearing             Lips appears normal, oropharynx mucosa, tongue, posterior pharynx appear normal  Neck: neck appears normal, no masses, normal ROM, no thyromegaly, no JVD  CVS: S1-S2 clear, no murmur rubs or gallops,  , no carotid bruits, pedal pulses palpable, No LE edema Respiratory: Rales right upper anteriorly.  Diminished breath sounds bilateral bases rales, no rhonchi. Respiratory effort normal. No accessory muscle use.  Abdomen: soft nontender, nondistended,  normal bowel sounds, no hepatosplenomegaly, no hernias Musculoskeletal: : no cyanosis, clubbing , deformity bilateral feet related to history of cerebral palsy, brace on left wrist Neuro: Weakness bilateral lower extremity otherwise grossly intact Psych: judgement and insight appear normal, stable mood and affect,  Skin: no rashes or lesions or ulcers, no induration or nodules   Labs on Admission: I have personally reviewed following labs and imaging studies  CBC: Recent Labs  Lab 10/02/19 0229  WBC 11.9*  NEUTROABS 10.1*  HGB 11.2*  HCT 36.7  MCV 104.0*  PLT [id number]   Basic Metabolic Panel: Recent Labs  Lab 10/02/19 0229  NA 139  K 3.5  CL 107  CO2 23  GLUCOSE 121*  BUN 17  CREATININE 1.14*  CALCIUM 8.1*   GFR: Estimated Creatinine Clearance: 32.9 mL/min (A) (by C-G formula based on SCr of 1.14 mg/dL (H)). Liver Function Tests: Recent Labs  Lab 10/02/19 0229  AST 45*  ALT 21  ALKPHOS 108  BILITOT 2.0*  PROT 6.7  ALBUMIN 3.2*   No results for input(s): LIPASE, AMYLASE in the last 168 hours. No results for input(s): AMMONIA in the last 168 hours. Coagulation Profile: No results for input(s): INR, PROTIME in the last 168 hours. Cardiac Enzymes: No results for input(s): CKTOTAL, CKMB, CKMBINDEX, TROPONINI in the last 168 hours. BNP (last 3 results) No results for input(s): PROBNP in the last 8760 hours. HbA1C: No results for input(s): HGBA1C in the last 72 hours. CBG: No results for input(s): GLUCAP in the last 168 hours. Lipid Profile: No results for input(s): CHOL, HDL, LDLCALC, TRIG, CHOLHDL, LDLDIRECT in the last 72 hours. Thyroid Function Tests: No results for input(s): TSH, T4TOTAL, FREET4, T3FREE, THYROIDAB in the last 72 hours. Anemia Panel: No results for input(s): VITAMINB12, FOLATE, FERRITIN, TIBC, IRON, RETICCTPCT in the last 72 hours. Urine analysis:    Component Value Date/Time   COLORURINE AMBER (A) 11/15/2017 0154   APPEARANCEUR CLOUDY (A)  11/15/2017 0154   LABSPEC 1.021 11/15/2017 0154   PHURINE 5.0 11/15/2017 0154   GLUCOSEU NEGATIVE  11/15/2017 0154   HGBUR SMALL (A) 11/15/2017 0154   BILIRUBINUR NEGATIVE 11/15/2017 0154   KETONESUR 5 (A) 11/15/2017 0154   PROTEINUR 100 (A) 11/15/2017 0154   NITRITE NEGATIVE 11/15/2017 0154   LEUKOCYTESUR NEGATIVE 11/15/2017 0154    Radiological Exams on Admission: DG Chest Portable 1 View  Result Date: 10/02/2019 CLINICAL DATA:  Shortness of breath, cough and congestion for 2 days increased work of breathing EXAM: PORTABLE CHEST 1 VIEW COMPARISON:  Radiograph Nov 16, 2017 FINDINGS: Mixed hazy interstitial and heterogeneous airspace disease most focally within the right mid to upper lung. Bilateral effusions are present, trace on the right and small on the left. There is cardiomegaly which is similar to prior. Calcified and tortuous aorta is again noted. No acute osseous or soft tissue abnormality. Stable mineralization seen in the right shoulder recess. Likely joint bodies. Severe degenerative changes in the right shoulder with more mild changes on the left. Multilevel degenerative changes in the spine as well. Telemetry leads overlie the chest. Evidence of prior bilateral breast prosthesis. IMPRESSION: 1. Findings findings likely represent some CHF/volume overload with cardiomegaly, pulmonary edema and bilateral effusions. 2. More patchy airspace disease in the right mid to upper lung could reflect developing asymmetric alveolar edema or pneumonia. 3.  Aortic Atherosclerosis (ICD10-I70.0). Electronically Signed   By: Lovena Le M.D.   On: 10/02/2019 02:29    EKG: Independently reviewed.   Assessment/Plan     Acute respiratory failure with hypoxia (HCC) -Suspect secondary to heart failure exacerbation -Lower suspicion for presence of pneumonia though could be possibility given associated cough and congestion -Follow-up Covid test.  Procalcitonin was 0.25 -Oxygen to keep sats over 94%    Acute on chronic combined systolic and diastolic CHF (congestive heart failure) (HCC)    Non-ischemic cardiomyopathy ([place]) -Patient presenting with shortness of breath.  BNP 2680, troponin 68 and chest x-ray showing pulmonary vascular congestion/fluid overload -Last EF on file was 20 to 25% -Elevated troponin suspect mostly related to demand ischemia from heart failure exacerbation -Continue to cycle troponins to evaluate for ACS -IV Lasix -Follow echocardiogram -Daily weights, intake and output monitoring -Continue Coreg, continue amiodarone.  Not currently on ACE inhibitor, probably related to renal disease   Elevated troponin   CAD (coronary artery disease) -No complaints of chest pain, EKG nonacute but troponin elevated at 68 -Continue to cycle troponins to evaluate for ACS -Carvedilol and aspirin.  As well as pravastatin      Essential (primary) hypertension -Continue Coreg    Rheumatoid arthritis with rheumatoid factor (HCC) -Continue methotrexate and prednisone    AF (paroxysmal atrial fibrillation) (HCC) -Continue amiodarone and Coreg -Not currently on systemic anticoagulation due to fall risk -Patient follows with Dr. Ubaldo Glassing    Chronic kidney disease (CKD), active medical management without dialysis, stage 3 (moderate) -At baseline.  Continue to monitor      Elevated liver enzymes -Possibly related to hepatic congestion from heart failure exacerbation    DVT prophylaxis: Lovenox  Code Status: full code  Family Communication:  none  Disposition Plan: Back to previous home environment Consults called: none  Status:obs   Athena Masse MD [facility]     10/02/2019, 5:07 AM

## 2019-10-02 NOTE — ED Notes (Signed)
Pt transported to CT ?

## 2019-10-02 NOTE — ED Notes (Signed)
1st RN note: pt arrives POV from home with c/o "not feeling well" after receiving her 2nd COVID vaccine today. Pt is non-ambulatory d/t h/x of cerebral palsy, but A&Ox4.

## 2019-10-02 NOTE — Progress Notes (Signed)
[facility] Progress Note  Patient: Tina Salinas    [id number]  DOA: 10/02/2019     Date of Service: the patient was seen and examined on 10/02/2019  Chief Complaint  Patient presents with  . Shortness of Breath  . Nasal Congestion   Brief hospital course:  Tina Salinas is a 74 y.o. 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 fib on amiodarone, not on anticoagulation due to fall risk, RA on methotrexate and chronic prednisone who presents to the emergency room with a 3-day history of feeling unwell with cough congestion and shortness of breath, all of which acutely worsened after she got her Covid shot on the day of arrival.  She denied chest pain, fever or chills.  Denied GI or GU symptoms  ED Course: On arrival in the emergency room O2 saturation was 7880% on room air requiring 5 L to maintain sats in the mid 90s.  She was afebrile with BP 134/76, HR 73 RR 22.  Her blood work significant for white cell count of 11,900, hemoglobin 11.2.  Creatinine 1.14 which is her baseline for CKD 3.  She was noted to have an elevated AST of 45 and total bili of 2.  Pro-Calc 0.25 troponin 68, BNP 2680, Hg normal sinus rhythm chest x-ray showing CHF/volume overload with pulmonary edema and bilateral effusions with small patchy airspace disease in the right mid to upper lung which could reflect developing asymmetric alveolar edema or pneumonia.  Patient was given a dose of Lasix.  Hospitalist consulted for admission   Currently further plan is to start Levaquin IV and continue supplemental oxygen  Assessment and Plan:  Acute respiratory failure with hypoxia due to community-acquired multifocal pneumonia COVID-19 negative, Procalcitonin was 0.25 CT chest shows multifocal pneumonia Continue supplemental oxygen and gradually wean off Started Levaquin 750 mg IV  daily Mucinex twice daily, Robitussin-DM as needed, every 6 hourly as needed Continue incentive spirometry and flutter valve Follow blood culture and sputum culture    Acute on chronic combined systolic and diastolic CHF (congestive heart failure) ([place])    Non-ischemic cardiomyopathy ([place]) -Patient presenting with shortness of breath.  BNP 2680, troponin 68 and chest x-ray showing pulmonary vascular congestion/fluid overload -Last EF on file was 20 to 25% -Elevated troponin suspect mostly related to demand ischemia from heart failure exacerbation -Continue to cycle troponins to evaluate for ACS -IV Lasix 40 every 12 hourly -TTE dilated LV with EF 20 to [id number], diastolic function normal, mild to moderate valvular involvement, no significant findings. -Daily weights, intake and output monitoring -Continue Coreg, continue amiodarone.  Not currently on ACE inhibitor, probably related to renal disease   Elevated troponin   CAD (coronary artery disease) -No complaints of chest pain, EKG nonacute but troponin elevated at 68>79>72 -Continue to cycle troponins to evaluate for ACS -Carvedilol and aspirin.  As well as pravastatin      Essential (primary) hypertension -Continue Coreg    Rheumatoid arthritis with rheumatoid factor (HCC) -Continue methotrexate and prednisone    AF (paroxysmal atrial fibrillation) (HCC) -Continue amiodarone and Coreg -Not currently on systemic anticoagulation due to fall risk -Patient follows with Dr. Ubaldo Glassing    Chronic kidney disease (CKD), active medical management without dialysis, stage 3 (moderate) -At baseline.  Continue to monitor      Elevated liver enzymes -Possibly related to hepatic congestion from heart failure exacerbation   --  Elevated TSH 5.2, slightly elevated free T4 1.57 Recommend to repeat TSH level after 4 to 6 weeks and follow with PCP.  Body mass index is 19.46 kg/m.  Interventions:  Diet: Heart healthy diet DVT Prophylaxis:  Subcutaneous Lovenox   Advance goals of care discussion: Full code  Family Communication: family was not present at bedside, at the time of interview.  The pt provided permission to discuss medical plan with the family. Opportunity was given to ask question and all questions were answered satisfactorily.   Disposition:  Pt is from home, admitted with shortness of breath, found to have pneumonia and CHF exacerbation, still has hypoxic respiratory failure, which precludes a safe discharge. Discharge to home, when until hypoxia improves.  Subjective: No overnight issues, patient is still short of breath and having productive cough, yellow color phlegm.  Patient was feeling sometimes hot and cold but no fever. Denied any chest pain no palpitations no abdominal pain no nausea vomiting or diarrhea.  Physical Exam: General:  alert oriented to time, place, and person.  Appear in mild distress, affect appropriate Eyes: PERRLA ENT: Oral Mucosa Clear, moist  Neck: no JVD,  Cardiovascular: S1 and S2 Present, no Murmur,  Respiratory: increased respiratory effort, Bilateral Air entry equal and Decreased, bilateral mild crackles, no significant wheezes Abdomen: Bowel Sound present, Soft and no tenderness,  Skin: No rashes Extremities: no Pedal edema, no calf tenderness Neurologic: without any new focal findings Gait not checked due to patient safety concerns  Vitals:   10/02/19 1100 10/02/19 1115 10/02/19 1130 10/02/19 1145  BP: 124/69  126/68   Pulse: 64  64 65  Resp:  20  (!) 22  Temp:      TempSrc:      SpO2: 100%  100% 99%  Weight:      Height:        Intake/Output Summary (Last 24 hours) at 10/02/2019 1444 Last data filed at 10/02/2019 1248 Gross per 24 hour  Intake --  Output 2775 ml  Net -2775 ml   Filed Weights   10/02/19 0201 10/02/19 0205  Weight: 43 kg 46.7 kg    Data Reviewed: I have personally reviewed and interpreted daily labs, tele strips, imagings as discussed  above. I reviewed all nursing notes, pharmacy notes, vitals, pertinent old records I have discussed plan of care as described above with RN and patient/family.  CBC: Recent Labs  Lab 10/02/19 0229  WBC 11.9*  NEUTROABS 10.1*  HGB 11.2*  HCT 36.7  MCV 104.0*  PLT [id number]   Basic Metabolic Panel: Recent Labs  Lab 10/02/19 0229  NA 139  K 3.5  CL 107  CO2 23  GLUCOSE 121*  BUN 17  CREATININE 1.14*  CALCIUM 8.1*    Studies: CT CHEST WO CONTRAST  Result Date: 10/02/2019 CLINICAL DATA:  74 year old female with history of shortness of breath. Evaluate for potential pneumonia. EXAM: CT CHEST WITHOUT CONTRAST TECHNIQUE: Multidetector CT imaging of the chest was performed following the standard protocol without IV contrast. COMPARISON:  No priors. FINDINGS: Cardiovascular: Heart size is mildly enlarged with left ventricular dilatation. Small amount of pericardial fluid and/or thickening, unlikely to be of any hemodynamic significance at this time. No associated pericardial calcification. There is aortic atherosclerosis, as well as atherosclerosis of the great vessels of the mediastinum and the coronary arteries, including calcified atherosclerotic plaque in the left main, left anterior descending, left circumflex and right coronary arteries. Mediastinum/Nodes: No pathologically enlarged mediastinal or hilar lymph nodes. Please  note that accurate exclusion of hilar adenopathy is limited on noncontrast CT scans. Esophagus is unremarkable in appearance. No axillary lymphadenopathy. Lungs/Pleura: Moderate bilateral pleural effusions lying dependently. Associated areas of passive atelectasis in the lower lobes of the lungs bilaterally. Patchy areas of ground-glass attenuation, septal thickening, thickening of the peribronchovascular interstitium and regional architectural distortion noted throughout the lungs bilaterally (right greater than left), concerning for multilobar pneumonia. No definite  suspicious appearing pulmonary nodules or masses are noted. Upper Abdomen: Aortic atherosclerosis. Peripherally calcified structure adjacent to the splenic hilum measuring 1.9 x 1.4 cm, likely to represent a splenic artery aneurysm. Musculoskeletal: Bilateral breast implants with dense capsular calcifications incidentally noted. There are no aggressive appearing lytic or blastic lesions noted in the visualized portions of the skeleton. IMPRESSION: 1. The appearance of the lungs is suggestive of multilobar pneumonia, as above. 2. Moderate bilateral pleural effusions lying dependently with areas of passive atelectasis in the dependent portions of the lungs bilaterally. 3. Small amount of pericardial fluid and/or thickening, unlikely to be of hemodynamic significance at this time. No pericardial calcification. 4. Aortic atherosclerosis, in addition to left main and 3 vessel coronary artery disease. Assessment for potential risk factor modification, dietary therapy or pharmacologic therapy may be warranted, if clinically indicated. 5. 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 Electronically Signed   By: Vinnie Langton M.D.   On: 10/02/2019 10:02   DG Chest Portable 1 View  Result Date: 10/02/2019 CLINICAL DATA:  Shortness of breath, cough and congestion for 2 days increased work of breathing EXAM: PORTABLE CHEST 1 VIEW COMPARISON:  Radiograph Nov 16, 2017 FINDINGS: Mixed hazy interstitial and heterogeneous airspace disease most focally within the right mid to upper lung. Bilateral effusions are present, trace on the right and small on the left. There is cardiomegaly which is similar to prior. Calcified and tortuous aorta is again noted. No acute osseous or soft tissue abnormality. Stable mineralization seen in the right shoulder recess. Likely joint bodies. Severe degenerative changes in the right shoulder with more mild changes on the left. Multilevel degenerative changes in the spine as well. Telemetry  leads overlie the chest. Evidence of prior bilateral breast prosthesis. IMPRESSION: 1. Findings findings likely represent some CHF/volume overload with cardiomegaly, pulmonary edema and bilateral effusions. 2. More patchy airspace disease in the right mid to upper lung could reflect developing asymmetric alveolar edema or pneumonia. 3.  Aortic Atherosclerosis (ICD10-I70.0). Electronically Signed   By: Lovena Le M.D.   On: 10/02/2019 02:29   ECHOCARDIOGRAM COMPLETE  Result Date: 10/02/2019    ECHOCARDIOGRAM REPORT   Patient Name:   BIJAN GARCIARAMIREZ Date of Exam: 10/02/2019 Medical Rec #:  [id number]    Height:       61.0 in Accession #:    [id number]   Weight:       103.0 lb Date of Birth:  [birth year]    BSA:          1.425 m Patient Age:    6 years     BP:           121/72 mmHg Patient Gender: F            HR:           64 bpm. Exam Location:  [facility] Procedure: 2D Echo, Color Doppler and Cardiac Doppler Indications:     [id number] CHF-Acute Systolic  History:         Patient has prior history of Echocardiogram examinations.  Signs/Symptoms:Shortness of Breath and Leg edema; Risk                  Factors:Hypertension.  Sonographer:     Charmayne Sheer RDCS (AE) Referring Phys:  [id number] Athena Masse Diagnosing Phys: Yolonda Kida MD  Sonographer Comments: Image acquisition challenging due to breast implants. IMPRESSIONS  1. Left ventricular ejection fraction, by estimation, is 20 to 25%. The left ventricle has severely decreased function. The left ventricle demonstrates global hypokinesis. The left ventricular internal cavity size was severely dilated. There is mild left ventricular hypertrophy. Left ventricular diastolic parameters were normal.  2. Right ventricular systolic function is mildly reduced. The right ventricular size is mildly enlarged.  3. Left atrial size was mildly dilated.  4. Right atrial size was mildly dilated.  5. The mitral valve is normal in structure. Moderate mitral valve  regurgitation.  6. Tricuspid valve regurgitation is mild to moderate.  7. The aortic valve is grossly normal. Aortic valve regurgitation is not visualized. FINDINGS  Left Ventricle: Left ventricular ejection fraction, by estimation, is 20 to 25%. The left ventricle has severely decreased function. The left ventricle demonstrates global hypokinesis. The left ventricular internal cavity size was severely dilated. There is mild left ventricular hypertrophy. Left ventricular diastolic parameters were normal. Right Ventricle: The right ventricular size is mildly enlarged. No increase in right ventricular wall thickness. Right ventricular systolic function is mildly reduced. Left Atrium: Left atrial size was mildly dilated. Right Atrium: Right atrial size was mildly dilated. Pericardium: Trivial pericardial effusion is present. The pericardial effusion is anterior to the right ventricle. Mitral Valve: The mitral valve is normal in structure. Moderate mitral valve regurgitation. MV peak gradient, 5.0 mmHg. The mean mitral valve gradient is 2.0 mmHg. Tricuspid Valve: The tricuspid valve is grossly normal. Tricuspid valve regurgitation is mild to moderate. Aortic Valve: The aortic valve is grossly normal. Aortic valve regurgitation is not visualized. Aortic valve mean gradient measures 5.0 mmHg. Aortic valve peak gradient measures 9.4 mmHg. Aortic valve area, by VTI measures 1.65 cm. Pulmonic Valve: The pulmonic valve was normal in structure. Pulmonic valve regurgitation is not visualized. Aorta: The aortic root is normal in size and structure. IAS/Shunts: No atrial level shunt detected by color flow Doppler.  LEFT VENTRICLE PLAX 2D LVIDd:         4.98 cm      Diastology LVIDs:         4.79 cm      LV e' lateral:   5.98 cm/s LV PW:         0.79 cm      LV E/e' lateral: 19.6 LV IVS:        0.66 cm      LV e' medial:    5.11 cm/s LVOT diam:     2.00 cm      LV E/e' medial:  22.9 LV SV:         47 LV SV Index:   33 LVOT Area:      3.14 cm  LV Volumes (MOD) LV vol d, MOD A2C: 127.0 ml LV vol d, MOD A4C: 146.0 ml LV vol s, MOD A2C: 95.0 ml LV vol s, MOD A4C: 110.0 ml LV SV MOD A2C:     32.0 ml LV SV MOD A4C:     146.0 ml LV SV MOD BP:      34.1 ml RIGHT VENTRICLE RV Basal diam:  3.11 cm LEFT ATRIUM  Index       RIGHT ATRIUM          Index LA diam:        3.50 cm 2.46 cm/m  RA Area:     8.10 cm LA Vol (A2C):   34.5 ml 24.22 ml/m RA Volume:   15.90 ml 11.16 ml/m LA Vol (A4C):   52.8 ml 37.06 ml/m LA Biplane Vol: 44.0 ml 30.89 ml/m  AORTIC VALVE                    PULMONIC VALVE AV Area (Vmax):    1.75 cm     PV Vmax:       0.75 m/s AV Area (Vmean):   1.68 cm     PV Vmean:      48.400 cm/s AV Area (VTI):     1.65 cm     PV VTI:        0.107 m AV Vmax:           153.00 cm/s  PV Peak grad:  2.2 mmHg AV Vmean:          105.000 cm/s PV Mean grad:  1.0 mmHg AV VTI:            0.287 m AV Peak Grad:      9.4 mmHg AV Mean Grad:      5.0 mmHg LVOT Vmax:         85.00 cm/s LVOT Vmean:        56.000 cm/s LVOT VTI:          0.151 m LVOT/AV VTI ratio: 0.53  AORTA Ao Root diam: 2.90 cm MITRAL VALVE MV Area (PHT): 3.91 cm     SHUNTS MV Peak grad:  5.0 mmHg     Systemic VTI:  0.15 m MV Mean grad:  2.0 mmHg     Systemic Diam: 2.00 cm MV Vmax:       1.12 m/s MV Vmean:      64.6 cm/s MV Decel Time: 194 msec MV E velocity: 117.00 cm/s MV A velocity: 57.70 cm/s MV E/A ratio:  2.03 Dwayne D Callwood MD Electronically signed by Yolonda Kida MD Signature Date/Time: 10/02/2019/12:55:59 PM    Final     Scheduled Meds: . amiodarone  200 mg Oral Daily  . aspirin EC  81 mg Oral Daily  . calcium carbonate  1,250 mg Oral Daily  . carvedilol  3.125 mg Oral BID  . cholecalciferol  1,000 Units Oral Daily  . collagenase  1 application Topical Daily  . enoxaparin (LOVENOX) injection  40 mg Subcutaneous Q24H  . folic acid  1 mg Oral Daily  . furosemide  40 mg Intravenous Q12H  . hydroxychloroquine  400 mg Oral Daily  . latanoprost  1 drop  Both Eyes QHS  . mirtazapine  30 mg Oral QHS  . pravastatin  40 mg Oral QHS  . predniSONE  2.5 mg Oral Q breakfast  . sodium chloride flush  3 mL Intravenous Q12H   Continuous Infusions: . sodium chloride    . levofloxacin (LEVAQUIN) IV     PRN Meds: sodium chloride, acetaminophen, ALPRAZolam, cyclobenzaprine, cyproheptadine, HYDROcodone-acetaminophen, Methylcellulose (Laxative), ondansetron (ZOFRAN) IV, oxybutynin, sodium chloride flush  Time spent: 35 minutes  Author: Val Riles. MD [facility] 10/02/2019 2:44 PM  To reach On-call, see care teams to locate the attending and reach out to them via [email or web address]. If 7PM-7AM, please contact night-coverage If you still have difficulty reaching  the attending provider, please page the [facility] (Director on Call) for [facility] on amion for assistance.

## 2019-10-02 NOTE — Progress Notes (Signed)
MD reached out to ask about parameters for lasix since BP has been in the 0000000 systolic. MD put in parameters for carvedilol and lasix. Lasix held per parameter.
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
## 2019-10-02 NOTE — ED Triage Notes (Addendum)
Pt to ED reporting congestion, cough and SOB x 2 days. Increased WOB noted upon assessment but pt is able to speak in complete sentences. Audible wheezing. Pt had her second COVID vaccination today and reports the symptoms started to worsen after that. Mild generalized body aches as well.  Pt reports multiple episodes of vomiting today.

## 2019-10-02 NOTE — ED Notes (Signed)
Pt given ice water. Denies any further needs at this time.

## 2019-10-02 NOTE — ED Notes (Signed)
Pt was soiled with urine and BM. This RN and Mac, RN, cleaned pt up and changed her brief. No redness or breakdown was noted. Purewick placed on pt.

## 2019-10-02 NOTE — Progress Notes (Signed)
*  PRELIMINARY RESULTS* Echocardiogram 2D Echocardiogram has been performed.  Tina Salinas 10/02/2019, 11:37 AM

## 2019-10-02 NOTE — Progress Notes (Signed)
Pt was able to explain that no medication changes to current list. However pt was unable to provide exact last dosage times. She was unsure if she took her medications yesterday. There is a methotrexate inj on her medication list that she receives weekly but last dosage is unknown at this time.

## 2019-10-02 NOTE — ED Notes (Signed)
Pt refusing ASA right now; states she takes it at night.

## 2019-10-02 NOTE — ED Notes (Signed)
Pt placed on 2 L of oxygen via [place]

## 2019-10-02 NOTE — ED Notes (Signed)
Pt resting on stretcher and appears comfortable. Pt respirations are equal and unlabored, no signs of acute distress.

## 2019-10-03 DIAGNOSIS — J189 Pneumonia, unspecified organism: Secondary | ICD-10-CM | POA: Diagnosis present

## 2019-10-03 DIAGNOSIS — E876 Hypokalemia: Secondary | ICD-10-CM | POA: Clinically undetermined

## 2019-10-03 DIAGNOSIS — E43 Unspecified severe protein-calorie malnutrition: Secondary | ICD-10-CM | POA: Diagnosis present

## 2019-10-03 DIAGNOSIS — I130 Hypertensive heart and chronic kidney disease with heart failure and stage 1 through stage 4 chronic kidney disease, or unspecified chronic kidney disease: Secondary | ICD-10-CM | POA: Diagnosis present

## 2019-10-03 DIAGNOSIS — I5043 Acute on chronic combined systolic (congestive) and diastolic (congestive) heart failure: Secondary | ICD-10-CM | POA: Diagnosis present

## 2019-10-03 DIAGNOSIS — R946 Abnormal results of thyroid function studies: Secondary | ICD-10-CM | POA: Diagnosis present

## 2019-10-03 DIAGNOSIS — Z681 Body mass index (BMI) 19 or less, adult: Secondary | ICD-10-CM | POA: Diagnosis not present

## 2019-10-03 DIAGNOSIS — G9341 Metabolic encephalopathy: Secondary | ICD-10-CM | POA: Diagnosis not present

## 2019-10-03 DIAGNOSIS — I2510 Atherosclerotic heart disease of native coronary artery without angina pectoris: Secondary | ICD-10-CM | POA: Diagnosis present

## 2019-10-03 DIAGNOSIS — B954 Other streptococcus as the cause of diseases classified elsewhere: Secondary | ICD-10-CM | POA: Diagnosis present

## 2019-10-03 DIAGNOSIS — I248 Other forms of acute ischemic heart disease: Secondary | ICD-10-CM | POA: Diagnosis present

## 2019-10-03 DIAGNOSIS — Z20822 Contact with and (suspected) exposure to covid-19: Secondary | ICD-10-CM | POA: Diagnosis present

## 2019-10-03 DIAGNOSIS — E559 Vitamin D deficiency, unspecified: Secondary | ICD-10-CM | POA: Diagnosis present

## 2019-10-03 DIAGNOSIS — I428 Other cardiomyopathies: Secondary | ICD-10-CM | POA: Diagnosis present

## 2019-10-03 DIAGNOSIS — E538 Deficiency of other specified B group vitamins: Secondary | ICD-10-CM | POA: Diagnosis present

## 2019-10-03 DIAGNOSIS — D7589 Other specified diseases of blood and blood-forming organs: Secondary | ICD-10-CM | POA: Diagnosis present

## 2019-10-03 DIAGNOSIS — G809 Cerebral palsy, unspecified: Secondary | ICD-10-CM | POA: Diagnosis present

## 2019-10-03 DIAGNOSIS — M059 Rheumatoid arthritis with rheumatoid factor, unspecified: Secondary | ICD-10-CM | POA: Diagnosis present

## 2019-10-03 DIAGNOSIS — N1831 Chronic kidney disease, stage 3a: Secondary | ICD-10-CM | POA: Diagnosis present

## 2019-10-03 DIAGNOSIS — J9601 Acute respiratory failure with hypoxia: Secondary | ICD-10-CM | POA: Diagnosis present

## 2019-10-03 DIAGNOSIS — N390 Urinary tract infection, site not specified: Secondary | ICD-10-CM | POA: Diagnosis present

## 2019-10-03 DIAGNOSIS — R7401 Elevation of levels of liver transaminase levels: Secondary | ICD-10-CM | POA: Diagnosis present

## 2019-10-03 DIAGNOSIS — I480 Paroxysmal atrial fibrillation: Secondary | ICD-10-CM | POA: Diagnosis present

## 2019-10-03 DIAGNOSIS — R64 Cachexia: Secondary | ICD-10-CM | POA: Diagnosis present

## 2019-10-03 LAB — BASIC METABOLIC PANEL
Anion gap: 9 (ref 5–15)
BUN: 27 mg/dL — ABNORMAL HIGH (ref 8–23)
CO2: 27 mmol/L (ref 22–32)
Calcium: 7.7 mg/dL — ABNORMAL LOW (ref 8.9–10.3)
Chloride: 104 mmol/L (ref 98–111)
Creatinine, Ser: 1.18 mg/dL — ABNORMAL HIGH (ref 0.44–1.00)
GFR calc Af Amer: 53 mL/min — ABNORMAL LOW (ref >60)
GFR calc non Af Amer: 46 mL/min — ABNORMAL LOW (ref >60)
Glucose, Bld: 121 mg/dL — ABNORMAL HIGH (ref 70–99)
Potassium: 2.9 mmol/L — ABNORMAL LOW (ref 3.5–5.1)
Sodium: 140 mmol/L (ref 135–145)

## 2019-10-03 LAB — VITAMIN B12: Vitamin B-12: 118 pg/mL — ABNORMAL LOW (ref 180–914)

## 2019-10-03 LAB — CBC
HCT: 31.4 % — ABNORMAL LOW (ref 36.0–46.0)
Hemoglobin: 9.9 g/dL — ABNORMAL LOW (ref 12.0–15.0)
MCH: 32.2 pg (ref 26.0–34.0)
MCHC: 31.5 g/dL (ref 30.0–36.0)
MCV: 102.3 fL — ABNORMAL HIGH (ref 80.0–100.0)
Platelets: 215 10*3/uL (ref 150–400)
RBC: 3.07 MIL/uL — ABNORMAL LOW (ref 3.87–5.11)
RDW: 14.6 % (ref 11.5–15.5)
WBC: 11.5 10*3/uL — ABNORMAL HIGH (ref 4.0–10.5)
nRBC: 0 % (ref 0.0–0.2)

## 2019-10-03 LAB — MAGNESIUM: Magnesium: 2 mg/dL (ref 1.7–2.4)

## 2019-10-03 LAB — IRON AND TIBC
Iron: 26 ug/dL — ABNORMAL LOW (ref 28–170)
Saturation Ratios: 12 % (ref 10.4–31.8)
TIBC: 221 ug/dL — ABNORMAL LOW (ref 250–450)
UIBC: 195 ug/dL

## 2019-10-03 LAB — FERRITIN: Ferritin: 87 ng/mL (ref 11–307)

## 2019-10-03 LAB — PHOSPHORUS: Phosphorus: 2.6 mg/dL (ref 2.5–4.6)

## 2019-10-03 LAB — FOLATE: Folate: 28 ng/mL (ref >5.9)

## 2019-10-03 LAB — VITAMIN D 25 HYDROXY (VIT D DEFICIENCY, FRACTURES): Vit D, 25-Hydroxy: 18.95 ng/mL — ABNORMAL LOW (ref 30–100)

## 2019-10-03 MED ORDER — POTASSIUM CHLORIDE CRYS ER 20 MEQ PO TBCR
40.0000 meq | EXTENDED_RELEASE_TABLET | ORAL | Status: AC
  Administered 2019-10-03 (×3): 40 meq via ORAL
  Filled 2019-10-03 (×3): qty 2

## 2019-10-03 MED ORDER — VITAMIN D (ERGOCALCIFEROL) 1.25 MG (50000 UNIT) PO CAPS
50000.0000 [IU] | ORAL_CAPSULE | ORAL | Status: DC
  Administered 2019-10-03 – 2019-10-10 (×2): 50000 [IU] via ORAL
  Filled 2019-10-03 (×2): qty 1

## 2019-10-03 MED ORDER — CARVEDILOL 3.125 MG PO TABS
3.1250 mg | ORAL_TABLET | Freq: Two times a day (BID) | ORAL | Status: DC
  Administered 2019-10-04 – 2019-10-09 (×7): 3.125 mg via ORAL
  Filled 2019-10-03 (×11): qty 1

## 2019-10-03 MED ORDER — CYANOCOBALAMIN 1000 MCG/ML IJ SOLN
1000.0000 ug | Freq: Every day | INTRAMUSCULAR | Status: AC
  Administered 2019-10-03 – 2019-10-09 (×7): 1000 ug via INTRAMUSCULAR
  Filled 2019-10-03 (×7): qty 1

## 2019-10-03 MED ORDER — LEVOFLOXACIN IN D5W 750 MG/150ML IV SOLN: 750.0000 mg | INTRAVENOUS | Status: DC

## 2019-10-03 MED ORDER — POLYSACCHARIDE IRON COMPLEX 150 MG PO CAPS
150.0000 mg | ORAL_CAPSULE | Freq: Every day | ORAL | Status: DC
  Administered 2019-10-03 – 2019-10-11 (×9): 150 mg via ORAL
  Filled 2019-10-03 (×9): qty 1

## 2019-10-03 MED ORDER — ASCORBIC ACID 500 MG PO TABS
500.0000 mg | ORAL_TABLET | Freq: Every day | ORAL | Status: DC
  Administered 2019-10-03 – 2019-10-11 (×9): 500 mg via ORAL
  Filled 2019-10-03 (×9): qty 1

## 2019-10-03 MED ORDER — SODIUM CHLORIDE 0.9 % IV SOLN
1.0000 g | INTRAVENOUS | Status: AC
  Administered 2019-10-04 – 2019-10-06 (×3): 1 g via INTRAVENOUS
  Filled 2019-10-03: qty 1
  Filled 2019-10-03: qty 10
  Filled 2019-10-03: qty 1

## 2019-10-03 MED ORDER — POTASSIUM CHLORIDE CRYS ER 20 MEQ PO TBCR: 40.0000 meq | EXTENDED_RELEASE_TABLET | ORAL | Status: DC

## 2019-10-03 MED ORDER — FUROSEMIDE 20 MG PO TABS
20.0000 mg | ORAL_TABLET | Freq: Every day | ORAL | Status: DC
  Filled 2019-10-03: qty 1

## 2019-10-03 MED ORDER — POTASSIUM CHLORIDE 10 MEQ/100ML IV SOLN
10.0000 meq | INTRAVENOUS | Status: DC
  Administered 2019-10-03: 10 meq via INTRAVENOUS
  Filled 2019-10-03 (×4): qty 100

## 2019-10-03 MED ORDER — AZITHROMYCIN 250 MG PO TABS
500.0000 mg | ORAL_TABLET | Freq: Every day | ORAL | Status: AC
  Administered 2019-10-04 – 2019-10-06 (×3): 500 mg via ORAL
  Filled 2019-10-03 (×3): qty 2

## 2019-10-03 NOTE — Progress Notes (Addendum)
[facility] Progress Note  Patient: Tina Salinas    [id number]  DOA: 10/02/2019     Date of Service: the patient was seen and examined on 10/03/2019  Chief Complaint  Patient presents with  . Shortness of Breath  . Nasal Congestion   Brief hospital course:  STARNISHA CACCIOLA is a 74 y.o. 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 fib on amiodarone, not on anticoagulation due to fall risk, RA on methotrexate and chronic prednisone who presents to the emergency room with a 3-day history of feeling unwell with cough congestion and shortness of breath, all of which acutely worsened after she got her Covid shot on the day of arrival.  She denied chest pain, fever or chills.  Denied GI or GU symptoms  ED Course: On arrival in the emergency room O2 saturation was 7880% on room air requiring 5 L to maintain sats in the mid 90s.  She was afebrile with BP 134/76, HR 73 RR 22.  Her blood work significant for white cell count of 11,900, hemoglobin 11.2.  Creatinine 1.14 which is her baseline for CKD 3.  She was noted to have an elevated AST of 45 and total bili of 2.  Pro-Calc 0.25 troponin 68, BNP 2680, Hg normal sinus rhythm chest x-ray showing CHF/volume overload with pulmonary edema and bilateral effusions with small patchy airspace disease in the right mid to upper lung which could reflect developing asymmetric alveolar edema or pneumonia.  Patient was given a dose of Lasix.  Hospitalist consulted for admission   Currently further plan is to start Levaquin IV and continue supplemental oxygen  Assessment and Plan:  Acute respiratory failure with hypoxia due to community-acquired multifocal pneumonia COVID-19 negative, Procalcitonin was 0.25 CT chest shows multifocal pneumonia Continue supplemental oxygen and gradually wean off S/p Levaquin 750 mg IV x 1 dose 3/19  started ceftriaxone and azithromycin as per pharmacy Mucinex twice daily, Robitussin-DM as needed, every 6 hourly as needed Continue incentive spirometry and flutter valve Follow blood culture NGTD and sputum culture    Acute on chronic combined systolic and diastolic CHF (congestive heart failure) ([place])    Non-ischemic cardiomyopathy ([place]) -Patient presenting with shortness of breath.  BNP 2680, troponin 68 and chest x-ray showing pulmonary vascular congestion/fluid overload -Last EF on file was 20 to 25% -Elevated troponin suspect mostly related to demand ischemia from heart failure exacerbation -Continue to cycle troponins to evaluate for ACS -IV Lasix 40 every 12 hourly, decreased 20 mg Iv q12, transition to oral 20 mg p.o. daily from 10/04/2019 -TTE dilated LV with EF 20 to [id number], diastolic function normal, mild to moderate valvular involvement, no significant findings. -Daily weights, intake and output monitoring -Continue Coreg, continue amiodarone.  Not currently on ACE inhibitor, probably related to renal disease  Hypokalemia  most likely secondary to diuretics Potassium repleted, continue to monitor and replete as needed.   Elevated troponin   CAD (coronary artery disease) -No complaints of chest pain, EKG nonacute but troponin elevated at 68>79>72 -Continue to cycle troponins to evaluate for ACS -Carvedilol and aspirin.  As well as pravastatin      Essential (primary) hypertension -Continue Coreg    Rheumatoid arthritis with rheumatoid factor (HCC) -Continue methotrexate and prednisone    AF (paroxysmal atrial fibrillation) (HCC) -Continue amiodarone and Coreg -Not currently on systemic anticoagulation due to fall risk -Patient follows with  Dr. Ubaldo Glassing    Chronic kidney disease (CKD), active medical management without dialysis, stage 3 (moderate) -At baseline.  Continue to monitor      Elevated liver enzymes -Possibly related to hepatic congestion from heart  failure exacerbation   --Elevated TSH 5.2, slightly elevated free T4 1.57 Recommend to repeat TSH level after 4 to 6 weeks and follow with PCP.  Vitamin D deficiency, start vitamin D 50,000 units p.o. weekly for 8 weeks, prescribed on discharge.    Vitamin B12 deficiency, started vitamin B12 IM injection 1000 units daily until patient has in the hospital then start oral supplement on discharge. Macrocytosis, folate within normal range   Follow-up PT/OT but before discharge, patient is very debilitated may need SNF placement  Body mass index is 20 kg/m.  Interventions:  Diet: Heart healthy diet DVT Prophylaxis: Subcutaneous Lovenox   Advance goals of care discussion: Full code  Family Communication: family was not present at bedside, at the time of interview.  The pt provided permission to discuss medical plan with the family. Opportunity was given to ask question and all questions were answered satisfactorily.   Disposition:  Pt is from home, admitted with shortness of breath, found to have acute hypoxic respiratory failure secondary to pneumonia. still has hypoxic respiratory failure, which precludes a safe discharge. Discharge to possible SNF versus home health, dependence on PT/OT eval.   Patient cannot be discharged until hypoxia improves.  Subjective: No overnight issues, patient is still short of breath and having productive cough, yellow color phlegm.  Patient was feeling sometimes hot and cold but no fever. Denied any chest pain no palpitations no abdominal pain no nausea vomiting or diarrhea.  Physical Exam: General:  alert oriented to time, place, and person.  Appear in mild distress, affect appropriate Eyes: PERRLA ENT: Oral Mucosa Clear, moist  Neck: no JVD,  Cardiovascular: S1 and S2 Present, no Murmur,  Respiratory: increased respiratory effort, Bilateral Air entry equal and Decreased, bilateral mild crackles, no significant wheezes Abdomen: Bowel Sound  present, Soft and no tenderness,  Skin: No rashes Extremities: no Pedal edema, no calf tenderness Neurologic: without any new focal findings Gait not checked due to patient safety concerns  Vitals:   10/03/19 0424 10/03/19 0821 10/03/19 1222 10/03/19 1546  BP: 109/62 (!) 109/54 101/60 (!) 90/49  Pulse: 74 60 67 69  Resp: 20 16 16 16   Temp: 98.9 F (37.2 C) 97.6 F (36.4 C) 97.6 F (36.4 C) 98.1 F (36.7 C)  TempSrc: Oral Oral Oral Oral  SpO2: 96% 99% 99% 98%  Weight:   46.4 kg   Height:        Intake/Output Summary (Last 24 hours) at 10/03/2019 1626 Last data filed at 10/03/2019 1432 Gross per 24 hour  Intake 240 ml  Output 1400 ml  Net -1160 ml   Filed Weights   10/02/19 0205 10/02/19 1630 10/03/19 1222  Weight: 46.7 kg 46.4 kg 46.4 kg    Data Reviewed: I have personally reviewed and interpreted daily labs, tele strips, imagings as discussed above. I reviewed all nursing notes, pharmacy notes, vitals, pertinent old records I have discussed plan of care as described above with RN and patient/family.  CBC: Recent Labs  Lab 10/02/19 0229 10/03/19 0450  WBC 11.9* 11.5*  NEUTROABS 10.1*  --   HGB 11.2* 9.9*  HCT 36.7 31.4*  MCV 104.0* 102.3*  PLT 269 [id number]   Basic Metabolic Panel: Recent Labs  Lab 10/02/19 0229 10/03/19 0450  NA  139 140  K 3.5 2.9*  CL 107 104  CO2 23 27  GLUCOSE 121* 121*  BUN 17 27*  CREATININE 1.14* 1.18*  CALCIUM 8.1* 7.7*  MG  --  2.0  PHOS  --  2.6    Studies: No results found.  Scheduled Meds: . amiodarone  200 mg Oral Daily  . aspirin EC  81 mg Oral Daily  . [START ON 10/04/2019] azithromycin  500 mg Oral Daily  . calcium carbonate  1,250 mg Oral Daily  . [START ON 10/04/2019] carvedilol  3.125 mg Oral BID  . cholecalciferol  1,000 Units Oral Daily  . collagenase  1 application Topical Daily  . enoxaparin (LOVENOX) injection  40 mg Subcutaneous Q24H  . feeding supplement (ENSURE ENLIVE)  1 Bottle Oral TID BM  . folic acid   1 mg Oral Daily  . [START ON 10/04/2019] furosemide  20 mg Oral Daily  . guaiFENesin  600 mg Oral BID  . hydroxychloroquine  400 mg Oral Daily  . latanoprost  1 drop Both Eyes QHS  . mouth rinse  15 mL Mouth Rinse BID  . mirtazapine  30 mg Oral QHS  . potassium chloride  40 mEq Oral Q4H  . pravastatin  40 mg Oral QHS  . predniSONE  2.5 mg Oral Q breakfast  . sodium chloride flush  3 mL Intravenous Q12H   Continuous Infusions: . sodium chloride 250 mL (10/02/19 1821)  . [START ON 10/04/2019] cefTRIAXone (ROCEPHIN)  IV     PRN Meds: sodium chloride, acetaminophen, ALPRAZolam, cyclobenzaprine, cyproheptadine, guaiFENesin-dextromethorphan, HYDROcodone-acetaminophen, ipratropium-albuterol, ondansetron (ZOFRAN) IV, oxybutynin, psyllium, sodium chloride flush  Time spent: 35 minutes  Author: Val Riles. MD [facility] 10/03/2019 4:26 PM  To reach On-call, see care teams to locate the attending and reach out to them via [email or web address]. If 7PM-7AM, please contact night-coverage If you still have difficulty reaching the attending provider, please page the [facility] (Director on Call) for [facility] on amion for assistance.

## 2019-10-03 NOTE — Progress Notes (Signed)
ReDS Pro not appropriate for this pt due to BMI < 22, and height < 5'1".

## 2019-10-03 NOTE — Consult Note (Signed)
PHARMACY NOTE:  ANTIMICROBIAL RENAL DOSAGE ADJUSTMENT  Current antimicrobial regimen includes a mismatch between antimicrobial dosage and estimated renal function.  As per policy approved by the Pharmacy & Therapeutics and Medical Executive Committees, the antimicrobial dosage will be adjusted accordingly.  Current antimicrobial dosage:  Levofloxacin 750 mg q24H   Indication: CAP  Renal Function:  Estimated Creatinine Clearance: 31 mL/min (A) (by C-G formula based on SCr of 1.18 mg/dL (H)).    Antimicrobial dosage has been changed to:  Ceftriaxone and azithromycin starting 3/20 1800. Pt received levofloxacin 750 mg on 3/18. Per renal function dosing should be q48H. Next dose of abx due at 3/20 1800.     Thank you for allowing pharmacy to be a part of this patient's care.  Oswald Hillock, [facility] 10/03/2019 3:00 PM

## 2019-10-04 LAB — BASIC METABOLIC PANEL
Anion gap: 3 — ABNORMAL LOW (ref 5–15)
BUN: <5 mg/dL — ABNORMAL LOW (ref 8–23)
CO2: 30 mmol/L (ref 22–32)
Calcium: 7.9 mg/dL — ABNORMAL LOW (ref 8.9–10.3)
Chloride: 110 mmol/L (ref 98–111)
Creatinine, Ser: 1.13 mg/dL — ABNORMAL HIGH (ref 0.44–1.00)
GFR calc Af Amer: 56 mL/min — ABNORMAL LOW (ref >60)
GFR calc non Af Amer: 49 mL/min — ABNORMAL LOW (ref >60)
Glucose, Bld: 109 mg/dL — ABNORMAL HIGH (ref 70–99)
Potassium: 4.6 mmol/L (ref 3.5–5.1)
Sodium: 143 mmol/L (ref 135–145)

## 2019-10-04 LAB — CBC
HCT: 32.3 % — ABNORMAL LOW (ref 36.0–46.0)
Hemoglobin: 9.6 g/dL — ABNORMAL LOW (ref 12.0–15.0)
MCH: 32 pg (ref 26.0–34.0)
MCHC: 29.7 g/dL — ABNORMAL LOW (ref 30.0–36.0)
MCV: 107.7 fL — ABNORMAL HIGH (ref 80.0–100.0)
Platelets: 213 10*3/uL (ref 150–400)
RBC: 3 MIL/uL — ABNORMAL LOW (ref 3.87–5.11)
RDW: 14.8 % (ref 11.5–15.5)
WBC: 7.9 10*3/uL (ref 4.0–10.5)
nRBC: 0 % (ref 0.0–0.2)

## 2019-10-04 LAB — ACTH: C206 ACTH: 3.5 pg/mL — ABNORMAL LOW (ref 7.2–63.3)

## 2019-10-04 LAB — MAGNESIUM: Magnesium: 2.3 mg/dL (ref 1.7–2.4)

## 2019-10-04 LAB — T4, FREE: Free T4: 1.41 ng/dL — ABNORMAL HIGH (ref 0.61–1.12)

## 2019-10-04 LAB — PROLACTIN: Prolactin: 27.2 ng/mL — ABNORMAL HIGH (ref 4.8–23.3)

## 2019-10-04 LAB — PHOSPHORUS: Phosphorus: 2.6 mg/dL (ref 2.5–4.6)

## 2019-10-04 LAB — TSH: TSH: 3.943 u[IU]/mL (ref 0.350–4.500)

## 2019-10-04 MED ORDER — IPRATROPIUM-ALBUTEROL 0.5-2.5 (3) MG/3ML IN SOLN
3.0000 mL | Freq: Four times a day (QID) | RESPIRATORY_TRACT | Status: DC
  Administered 2019-10-04 – 2019-10-05 (×3): 3 mL via RESPIRATORY_TRACT
  Filled 2019-10-04 (×3): qty 3

## 2019-10-04 MED ORDER — FUROSEMIDE 10 MG/ML IJ SOLN
20.0000 mg | Freq: Two times a day (BID) | INTRAMUSCULAR | Status: AC
  Administered 2019-10-04: 20 mg via INTRAVENOUS
  Filled 2019-10-04: qty 2

## 2019-10-04 NOTE — Progress Notes (Signed)
Respiratory treatment given earlier. Patient questioned when did she have to have another. Verified frequency and scheduled time. Patient immediately got upset about 2 am treatment and then breakfast interruptions.  I explained purpose of tx . I agreed not to wake her up if she was sleeping and told her txs would not interrupt her meals

## 2019-10-04 NOTE — Progress Notes (Addendum)
Physical Therapy Evaluation Patient Details Name: Tina Salinas MRN: [id number] DOB: [birth year] Today's Date: 10/04/2019   History of Present Illness  Tina Salinas is a 74 y.o. 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 fib on amiodarone, not on anticoagulation due to fall risk, RA on methotrexate and chronic prednisone who presents to the emergency room with a 3-day history of feeling unwell with cough congestion and shortness of breath, all of which acutely worsened after she got her Covid shot on the day of arrival.  Clinical Impression  Tina Salinas for PT evaluation. She was visited three times today with issues including breakfast tray arrival,  patient hygiene necessary, and then patient waiting on her lunch tray, resulting PT making several attempts to perform the evaluation. Prior to hospital admission, patient wasMOD I with bed mobility and transfers with RW, and gait with RW and was receiving skilled  PT in her home. Patient has hx of recent wrist sprain s/p fall. Pt lives alone and has a ramp.Patient currently requires max assist with supine<> sit bed mobility, max assist sit to stand transfer, has profound weakness B hips/ knees / ankles, and poor sitting balance. Patient would benefit from skilled PT to improve BLE strength, mobility and safety in order to decrease her falls risk and caregiver burden. Upon hospital discharge, recommend STR to maximize patient safety and return to PLOF.    Follow Up Recommendations SNF    Equipment Recommendations  Rolling walker with 5" wheels    Recommendations for Other Services       Precautions / Restrictions Precautions Precautions: Fall Restrictions Weight Bearing Restrictions: No      Mobility  Bed Mobility Overal bed mobility: Needs Assistance Bed Mobility: Supine to Sit;Sit to Supine     Supine  to sit: Mod assist Sit to supine: Max assist   General bed mobility comments: vc and use of hospital bed for assist  Transfers Overall transfer level: (patient refused / declined) Equipment used: Rolling walker (2 wheeled) Transfers: Sit to/from Stand Sit to Stand: Max assist;Total assist         General transfer comment: Pt attempted sit<>stand c RW, unable to clear rear c OT assist. Attempted sit<>stand w/o walker and OT providing B knee block - MAX A to clear rear decreasing to TOTAL A for controlled descent to bed  Ambulation/Gait                Stairs            Wheelchair Mobility    Modified Rankin (Stroke Patients Only)       Balance Overall balance assessment: Needs assistance Sitting-balance support: Bilateral upper extremity supported;Feet supported Sitting balance-Leahy Scale: Poor Sitting balance - Comments: Pt demonstrated posterior lean - unable to achieve upright posture c MOD A from OT. Pt refused MAX A from OT to maintain posture and requested to attempt on her own. Pt utilized BUE support on EOB and attempted to engage core/BUE to achieve upright position but exhibited a slowly increasing posterior lean - pt would not accept OT assist until >45* posterior lean Postural control: Posterior lean Standing balance support: Bilateral upper extremity supported Standing balance-Leahy Scale: Poor                               Pertinent Vitals/Pain Pain Assessment:  Faces Faces Pain Scale: Hurts little more Pain Location: left wrist Pain Descriptors / Indicators: Aching Pain Intervention(s): Limited activity within patient's tolerance    Home Living Family/patient expects to be discharged to:: Private residence Living Arrangements: Alone Available Help at Discharge: Friend(s);Available PRN/intermittently Type of Home: House Home Access: Ramped entrance     Home Layout: One level;Other (Comment)(with basement - pt does not use) Home  Equipment: Walker - 2 wheels;Bedside commode;Toilet riser;Shower seat;Adaptive equipment      Prior Function Level of Independence: (Patient reports that she walked with a [place] in the house)         Comments: Pt reports hx of 2 falls in last 4 months. Pt reports MOD I for mobility and ADLs using RW and AE (long handled shoe horn)     Hand Dominance   Dominant Hand: Left    Extremity/Trunk Assessment   Upper Extremity Assessment Upper Extremity Assessment: Defer to OT evaluation RUE Deficits / Details: Increased tone t/o R side 2/2 cerebral palsy. 5 finger opposition intact LUE Deficits / Details: Hx of recent wrist sprain, pt wearing soft splint. Grossly weak - unable to lift phone when reaching outside BOS and reclined in bed    Lower Extremity Assessment Lower Extremity Assessment: RLE deficits/detail;LLE deficits/detail RLE: (hip -3/5, knee -3/5, ankle -3/5) LLE: (hip -3/5, knee -3/5, ankle -3/5)    Cervical / Trunk Assessment Cervical / Trunk Assessment: Kyphotic  Communication   Communication: No difficulties  Cognition Arousal/Alertness: Awake/alert Behavior During Therapy: Agitated(upset that her lunch was cold and now she has to wait.) Overall Cognitive Status: Within Functional Limits for tasks assessed                                        General Comments      Exercises Other Exercises Other Exercises: Pt educated re: falls prevention, energy conservation strategies, OT role in acute setting, discharge recommendations, safe night time toileting, home/routines modifications Other Exercises: Self-feeding, PLB, bed mobility, don/doff B shoes, sitting/standing balance/tolerance, sit<>stand, sup<>sit, toileting   Assessment/Plan    PT Assessment Patient needs continued PT services  PT Problem List Decreased strength;Decreased range of motion;Decreased activity tolerance;Decreased balance;Decreased mobility       PT Treatment Interventions  Gait training;Functional mobility training;Therapeutic activities;Therapeutic exercise;Balance training    PT Goals (Current goals can be found in the Care Plan section)  Acute Rehab PT Goals Patient Stated Goal: no goals stated PT Goal Formulation: Patient unable to participate in goal setting Time For Goal Achievement: 10/18/19 Potential to Achieve Goals: Fair    Frequency Min 2X/week   Barriers to discharge        Co-evaluation               AM-PAC PT "6 Clicks" Mobility  Outcome Measure Help needed turning from your back to your side while in a flat bed without using bedrails?: A Lot Help needed moving from lying on your back to sitting on the side of a flat bed without using bedrails?: A Lot Help needed moving to and from a bed to a chair (including a wheelchair)?: Total Help needed standing up from a chair using your arms (e.g., wheelchair or bedside chair)?: Total Help needed to walk in hospital room?: Total Help needed climbing 3-5 steps with a railing? : Total 6 Click Score: 8    End of Session Equipment Utilized During Treatment:  Gait belt Activity Tolerance: Patient limited by fatigue;Treatment limited secondary to agitation(attempted to see patient 3 times, patient irritated x 3) Patient left: in bed;with bed alarm set Nurse Communication: Mobility status PT Visit Diagnosis: Muscle weakness (generalized) (M62.81);Difficulty in walking, not elsewhere classified (R26.2)    Time: [id number] PT Time Calculation (min) (ACUTE ONLY): 20 min   Charges:   PT Evaluation $PT Eval Low Complexity: 1 Low          PT DPT  Arelia Sneddon S 10/04/2019, 3:54 PM

## 2019-10-04 NOTE — Evaluation (Signed)
Occupational Therapy Evaluation Patient Details Name: Tina Salinas MRN: [id number] DOB: April 05, 1946 Today's Date: 10/04/2019    History of Present Illness Tina Salinas is a 74 y.o. 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 fib on amiodarone, not on anticoagulation due to fall risk, RA on methotrexate and chronic prednisone who presents to the emergency room with a 3-day history of feeling unwell with cough congestion and shortness of breath, all of which acutely worsened after she got her Covid shot on the day of arrival.   Clinical Impression   Tina Salinas was seen for OT evaluation this date. Prior to hospital admission, pt was MOD I with ADLs and functional mobility c hx of recent wrist sprain 2/2 fall. Pt lives alone in a ramped single level home. Pt presents to acute OT demonstrating impaired ADL performance and functional mobility 2/2 decreased safety awareness, functional strength/endurance/ROM deficits, and balance deficits. Pt currently requires TOTAL A for LBD, MAX A to sit EOB in preparation for ADL tasks, and MAX-TOTAL A for OOB mobility.  Pt would benefit from skilled OT to address noted impairments and functional limitations (see below for any additional details) in order to maximize safety and independence while minimizing falls risk and caregiver burden. Upon hospital discharge, recommend STR to maximize pt safety and return to PLOF.     Follow Up Recommendations  SNF    Equipment Recommendations       Recommendations for Other Services       Precautions / Restrictions Precautions Precautions: Fall Restrictions Weight Bearing Restrictions: No      Mobility Bed Mobility Overal bed mobility: Needs Assistance Bed Mobility: Supine to Sit;Sit to Supine     Supine to sit: Mod assist;HOB elevated Sit to supine: Max assist       Transfers Overall transfer level: Needs assistance Equipment used: Rolling walker (2 wheeled) Transfers: Sit to/from Stand Sit to Stand: Max assist;Total assist         General transfer comment: Pt attempted sit<>stand c RW, unable to clear rear c OT assist. Attempted sit<>stand w/o walker and OT providing B knee block - MAX A to clear rear decreasing to TOTAL A for controlled descent to bed    Balance Overall balance assessment: Needs assistance Sitting-balance support: Bilateral upper extremity supported;Feet supported Sitting balance-Leahy Scale: Poor Sitting balance - Comments: Pt demonstrated posterior lean - unable to achieve upright posture c MOD A from OT. Pt refused MAX A from OT to maintain posture and requested to attempt on her own. Pt utilized BUE support on EOB and attempted to engage core/BUE to achieve upright position but exhibited a slowly increasing posterior lean - pt would not accept OT assist until >45* posterior lean Postural control: Posterior lean Standing balance support: Bilateral upper extremity supported Standing balance-Leahy Scale: Poor                             ADL either performed or assessed with clinical judgement   ADL Overall ADL's : Needs assistance/impaired                                       General ADL Comments: TOTAL A don/doff B shoes. MAX A toileting bed level. SETUP self-drinking at bed level. Attempted  grooming seated EOB but pt required BUE support + MOD A     Vision Baseline Vision/History: Wears glasses Wears Glasses: Reading only       Perception     Praxis      Pertinent Vitals/Pain Pain Assessment: No/denies pain     Hand Dominance Left   Extremity/Trunk Assessment Upper Extremity Assessment Upper Extremity Assessment: LUE deficits/detail;Generalized weakness;RUE deficits/detail(BUE AROM WFL, strength grossly weak) RUE Deficits / Details: Increased tone t/o R side 2/2 cerebral  palsy. 5 finger opposition intact LUE Deficits / Details: Hx of recent wrist sprain, pt wearing soft splint. Grossly weak - unable to lift phone when reaching outside BOS and reclined in bed   Lower Extremity Assessment Lower Extremity Assessment: Generalized weakness   Cervical / Trunk Assessment Cervical / Trunk Assessment: Kyphotic   Communication Communication Communication: No difficulties   Cognition Arousal/Alertness: Awake/alert Behavior During Therapy: WFL for tasks assessed/performed(Talkative and wants to try things her way first) Overall Cognitive Status: Within Functional Limits for tasks assessed                                     General Comments       Exercises Exercises: Other exercises Other Exercises Other Exercises: Pt educated re: falls prevention, energy conservation strategies, OT role in acute setting, discharge recommendations, safe night time toileting, home/routines modifications Other Exercises: Self-feeding, PLB, bed mobility, don/doff B shoes, sitting/standing balance/tolerance, sit<>stand, sup<>sit, toileting   Shoulder Instructions      Home Living Family/patient expects to be discharged to:: Private residence Living Arrangements: Alone Available Help at Discharge: Friend(s);Available PRN/intermittently Type of Home: House Home Access: Ramped entrance     Home Layout: One level;Other (Comment)(with basement - pt does not use)     Bathroom Shower/Tub: Walk-in Hydrologist: Standard Bathroom Accessibility: Yes How Accessible: Accessible via walker(Very narrow - pt reports fall manipulating RW in bathroom ) Home Equipment: Walker - 2 wheels;Bedside commode;Toilet riser;Shower seat;Adaptive equipment Adaptive Equipment: Long-handled shoe horn        Prior Functioning/Environment Level of Independence: Independent with assistive device(s)        Comments: Pt reports hx of 2 falls in last 4 months. Pt  reports MOD I for mobility and ADLs using RW and AE (long handled shoe horn)        OT Problem List: Decreased strength;Decreased range of motion;Decreased activity tolerance;Impaired balance (sitting and/or standing);Decreased safety awareness;Decreased knowledge of use of DME or AE      OT Treatment/Interventions: Self-care/ADL training;Therapeutic exercise;Neuromuscular education;Energy conservation;DME and/or AE instruction;Therapeutic activities;Patient/family education;Balance training    OT Goals(Current goals can be found in the care plan section) Acute Rehab OT Goals Patient Stated Goal: To get up and go to bathroom on her own OT Goal Formulation: With patient Time For Goal Achievement: 10/18/19 Potential to Achieve Goals: Fair ADL Goals Pt Will Perform Grooming: with min assist;sitting(c AE PRN) Pt Will Perform Lower Body Dressing: with set-up;sitting/lateral leans;with adaptive equipment;with min assist Pt Will Transfer to Toilet: with mod assist;stand pivot transfer;bedside commode(c LRAD PRN)  OT Frequency: Min 1X/week   Barriers to D/C: Inaccessible home environment;Decreased caregiver support          Co-evaluation              AM-PAC OT "6 Clicks" Daily Activity     Outcome Measure Help from another person eating meals?: A Little Help  from another person taking care of personal grooming?: A Little Help from another person toileting, which includes using toliet, bedpan, or urinal?: A Lot Help from another person bathing (including washing, rinsing, drying)?: A Lot Help from another person to put on and taking off regular upper body clothing?: A Little Help from another person to put on and taking off regular lower body clothing?: A Lot 6 Click Score: 15   End of Session Equipment Utilized During Treatment: Rolling walker  Activity Tolerance: Patient tolerated treatment well Patient left: in bed;with call bell/phone within reach;with bed alarm set  OT  Visit Diagnosis: Unsteadiness on feet (R26.81);Other abnormalities of gait and mobility (R26.89);Muscle weakness (generalized) (M62.81)                Time: [id number] OT Time Calculation (min): 51 min Charges:  OT General Charges $OT Visit: 1 Visit OT Evaluation $OT Eval Moderate Complexity: 1 Mod OT Treatments $Self Care/Home Management : 38-52 mins  Dessie Coma, M.S. OTR/L  10/04/19, 1:49 PM

## 2019-10-04 NOTE — Progress Notes (Signed)
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 fib on amiodarone, not on anticoagulation due to fall risk, RA on methotrexate and chronic prednisone who presents to the emergency room with a 3-day history of feeling unwell with cough congestion and shortness of breath, all of which acutely worsenedafter she got her Covid shot on the day of arrival. She denied chest pain, fever or chills. Denied GI or GU symptoms  3/20: Patient seen and examined.  Irritated this morning.  Endorsing some disagreements with the nursing staff.  Difficult to redirect.  Reiterated that our only intention whether nursing, doctors, or techs to treat acute illness and stabilized for discharge.    Assessment & Plan:   Principal Problem:   Acute on chronic combined systolic and diastolic CHF (congestive heart failure) (HCC) Active Problems:   Essential (primary) hypertension   Rheumatoid arthritis with rheumatoid factor (HCC)   Elevated troponin   AF (paroxysmal atrial fibrillation) (HCC)   Chronic kidney disease (CKD), active medical management without dialysis, stage 3 (moderate)   Acute respiratory failure with hypoxia (HCC)   CAD (coronary artery disease)   Non-ischemic cardiomyopathy (HCC)   CHF (congestive heart failure) (HCC)   Elevated liver enzymes   CAP (community acquired pneumonia)   Hypokalemia  Acute respiratory failure with hypoxia due to community-acquired multifocal pneumonia COVID-19 negative, Procalcitonin was 0.25 CT chest shows multifocal pneumonia S/p Levaquin 750 mg IV x 1 dose 3/19 started ceftriaxone and azithromycin as per pharmacy Plan: Continue rocephin  Continue zithromax Wean oxygen as  tolerated Mucinex twice daily, Robitussin-DM as needed, every 6 hourly as needed Continue incentive spirometry and flutter valve Follow blood culture NGTD and sputum culture  Acute on chronic combined systolic and diastolic CHF (congestive heart failure) ([place]) Non-ischemic cardiomyopathy ([place]) -Patient presenting with shortness of breath. BNP 2680, troponin 68 and chest x-ray showing pulmonary vascular congestion/fluid overload -Last EF on file was 20 to 25% -Elevated troponin suspect mostly related to demand ischemia from heart failure exacerbation Plan:  Continue IV Lasix for today, 20 mg IV every 12 hours x2 doses Reassess kidney function, urine output, weight tomorrow morning for continued diuresis Daily weights Strict I's and O's Continue Coreg Continue amiodarone  Hypokalemia  most likely secondary to diuretics Potassium repleted, continue to monitor and replete as needed.  Elevated troponin CAD (coronary artery disease) -No complaints of chest pain, EKG nonacute but troponin elevated at 68>79>72 -No significant delta, no chest pain, no need cycle at this time -Carvedilol and aspirin. As well as pravastatin   Essential (primary) hypertension -Continue Coreg  Rheumatoid arthritis with rheumatoid factor (HCC) -Continue methotrexate and prednisone  AF (paroxysmal atrial fibrillation) (HCC) -Continue amiodarone and Coreg -Not currently on systemic anticoagulation due to fall risk -Patient follows with Dr. Ubaldo Glassing -We will reach out to cardiology if patient not responding to heart failure treatment  Chronic kidney disease stage IIIa -At baseline. Continue to monitor  Elevated liver enzymes -Possibly related to hepatic congestion from heart failure exacerbation  --Elevated TSH 5.2, slightly elevated free T4 1.57 Recommend to repeat TSH level after 4 to 6 weeks and follow with PCP.  Vitamin D deficiency, start vitamin D 50,000 units p.o. weekly  for 8 weeks, prescribed on discharge.  Vitamin B12 deficiency, started vitamin B12 IM injection 1000 units daily until patient has in the hospital then start oral supplement on discharge. Macrocytosis, folate within normal range  DVT prophylaxis: Lovenox Code Status: Full Family Communication: None today Disposition Plan: Anticipate return to previous home environment versus skilled nursing facility.  Apparently as per admission documentation the patient lives independently.  Physical therapy and Occupational Therapy consults were requested.  If patient is agreeable she may benefit from post discharge day and short-term rehab.  Cannot be discharged until hypoxia resolves.   Consultants:   none  Procedures:   none  Antimicrobials:   Levaquin   Subjective: Patient seen and examined Upset this morning Disagreements with the nursing staff Continues to endorse cough and shortness of breath  Objective: Vitals:   10/04/19 0355 10/04/19 0441 10/04/19 0500 10/04/19 0731  BP: 104/65 101/61  105/62  Pulse: 64 (!) 59  64  Resp: 18 18  17   Temp: (!) 97.5 F (36.4 C) 97.7 F (36.5 C)  (!) 97.4 F (36.3 C)  TempSrc: Oral   Oral  SpO2: 100% 96%  98%  Weight:   46.2 kg   Height:        Intake/Output Summary (Last 24 hours) at 10/04/2019 1525 Last data filed at 10/04/2019 1452 Gross per 24 hour  Intake 300 ml  Output 550 ml  Net -250 ml   Filed Weights   10/02/19 1630 10/03/19 1222 10/04/19 0500  Weight: 46.4 kg 46.4 kg 46.2 kg    Examination:  General exam: Appears agitated, appears chronically ill Respiratory system: C scattered crackles bilaterally.  Normal work of breathing.  Scattered wheeze.  Decreased air entry bilaterally Cardiovascular system: S1-S2 heard, RRR, no murmurs, no pedal edema gastrointestinal system: Scaphoid, nontender, nondistended, positive bowel sounds Central nervous system: Alert and oriented. No focal neurological deficits. Extremities:  Symmetric 5 x 5 power. Skin: Pale, thin, no rashes or lesions Psychiatry: Upset    Data Reviewed: I have personally reviewed following labs and imaging studies  CBC: Recent Labs  Lab 10/02/19 0229 10/03/19 0450 10/04/19 0506  WBC 11.9* 11.5* 7.9  NEUTROABS 10.1*  --   --   HGB 11.2* 9.9* 9.6*  HCT 36.7 31.4* 32.3*  MCV 104.0* 102.3* 107.7*  PLT 269 215 [id number]   Basic Metabolic Panel: Recent Labs  Lab 10/02/19 0229 10/03/19 0450 10/04/19 0506  NA 139 140 143  K 3.5 2.9* 4.6  CL 107 104 110  CO2 23 27 30   GLUCOSE 121* 121* 109*  BUN 17 27* <5*  CREATININE 1.14* 1.18* 1.13*  CALCIUM 8.1* 7.7* 7.9*  MG  --  2.0 2.3  PHOS  --  2.6 2.6   GFR: Estimated Creatinine Clearance: 32.3 mL/min (A) (by C-G formula based on SCr of 1.13 mg/dL (H)). Liver Function Tests: Recent Labs  Lab 10/02/19 0229  AST 45*  ALT 21  ALKPHOS 108  BILITOT 2.0*  PROT 6.7  ALBUMIN 3.2*   No results for input(s): LIPASE, AMYLASE in the last 168 hours. No results for input(s): AMMONIA in the last 168 hours. Coagulation Profile: No results for input(s): INR, PROTIME in the last 168 hours. Cardiac Enzymes: No results for input(s): CKTOTAL, CKMB, CKMBINDEX, TROPONINI in the last 168 hours. BNP (last 3 results) No results for input(s): PROBNP in the last 8760 hours. HbA1C: No results for input(s): HGBA1C in the last 72 hours. CBG: No results for input(s): GLUCAP in the last 168 hours. Lipid Profile: No results for  input(s): CHOL, HDL, LDLCALC, TRIG, CHOLHDL, LDLDIRECT in the last 72 hours. Thyroid Function Tests: Recent Labs    10/04/19 0506  TSH 3.943  FREET4 1.41*   Anemia Panel: Recent Labs    10/03/19 0836  VITAMINB12 118*  FOLATE 28.0  FERRITIN 87  TIBC 221*  IRON 26*   Sepsis Labs: Recent Labs  Lab 10/02/19 0229  PROCALCITON 0.25    Recent Results (from the past 240 hour(s))  Respiratory Panel by RT PCR (Flu A&B, Covid) - Nasopharyngeal Swab     Status: None    Collection Time: 10/02/19  4:22 AM   Specimen: Nasopharyngeal Swab  Result Value Ref Range Status   SARS Coronavirus 2 by RT PCR NEGATIVE NEGATIVE Final    Comment: (NOTE) SARS-CoV-2 target nucleic acids are NOT DETECTED. The SARS-CoV-2 RNA is generally detectable in upper respiratoy specimens during the acute phase of infection. The lowest concentration of SARS-CoV-2 viral copies this assay can detect is 131 copies/mL. A negative result does not preclude SARS-Cov-2 infection and should not be used as the sole basis for treatment or other patient management decisions. A negative result may occur with  improper specimen collection/handling, submission of specimen other than nasopharyngeal swab, presence of viral mutation(s) within the areas targeted by this assay, and inadequate number of viral copies (<131 copies/mL). A negative result must be combined with clinical observations, patient history, and epidemiological information. The expected result is Negative. Fact Sheet for Patients:  PinkCheek.be Fact Sheet for Healthcare Providers:  GravelBags.it This test is not yet ap proved or cleared by the [place] FDA and  has been authorized for detection and/or diagnosis of SARS-CoV-2 by FDA under an Emergency Use Authorization (EUA). This EUA will remain  in effect (meaning this test can be used) for the duration of the COVID-19 declaration under Section 564(b)(1) of the Act, 21 U.S.C. section 360bbb-3(b)(1), unless the authorization is terminated or revoked sooner.    Influenza A by PCR NEGATIVE NEGATIVE Final   Influenza B by PCR NEGATIVE NEGATIVE Final    Comment: (NOTE) The Xpert Xpress SARS-CoV-2/FLU/RSV assay is intended as an aid in  the diagnosis of influenza from Nasopharyngeal swab specimens and  should not be used as a sole basis for treatment. Nasal washings and  aspirates are unacceptable for Xpert Xpress  SARS-CoV-2/FLU/RSV  testing. Fact Sheet for Patients: PinkCheek.be Fact Sheet for Healthcare Providers: GravelBags.it This test is not yet approved or cleared by the [place] FDA and  has been authorized for detection and/or diagnosis of SARS-CoV-2 by  FDA under an Emergency Use Authorization (EUA). This EUA will remain  in effect (meaning this test can be used) for the duration of the  Covid-19 declaration under Section 564(b)(1) of the Act, 21  U.S.C. section 360bbb-3(b)(1), unless the authorization is  terminated or revoked. Performed at [facility], [address] [place], [place] [address]   CULTURE, BLOOD (ROUTINE X 2) w Reflex to ID Panel     Status: None (Preliminary result)   Collection Time: 10/02/19  4:24 PM   Specimen: BLOOD  Result Value Ref Range Status   Specimen Description BLOOD BLOOD LEFT HAND  Final   Special Requests   Final    BOTTLES DRAWN AEROBIC AND ANAEROBIC Blood Culture adequate volume   Culture   Final    NO GROWTH 2 DAYS Performed at [facility], [address] [place], [place] [address]    Report Status PENDING  Incomplete  CULTURE, BLOOD (  ROUTINE X 2) w Reflex to ID Panel     Status: None (Preliminary result)   Collection Time: 10/02/19  4:24 PM   Specimen: BLOOD  Result Value Ref Range Status   Specimen Description BLOOD BLOOD LEFT WRIST  Final   Special Requests   Final    BOTTLES DRAWN AEROBIC AND ANAEROBIC Blood Culture adequate volume   Culture   Final    NO GROWTH 2 DAYS Performed at [facility], [address], [place], [place] [address]    Report Status PENDING  Incomplete         Radiology Studies: No results found.      Scheduled Meds: . amiodarone  200 mg Oral Daily  . vitamin C  500 mg Oral Daily  . aspirin EC  81 mg Oral Daily  . azithromycin  500 mg Oral Daily  . calcium carbonate  1,250 mg Oral Daily  . carvedilol   3.125 mg Oral BID  . cholecalciferol  1,000 Units Oral Daily  . collagenase  1 application Topical Daily  . cyanocobalamin  1,000 mcg Intramuscular Daily  . enoxaparin (LOVENOX) injection  40 mg Subcutaneous Q24H  . feeding supplement (ENSURE ENLIVE)  1 Bottle Oral TID BM  . folic acid  1 mg Oral Daily  . guaiFENesin  600 mg Oral BID  . hydroxychloroquine  400 mg Oral Daily  . ipratropium-albuterol  3 mL Nebulization Q6H  . iron polysaccharides  150 mg Oral Daily  . latanoprost  1 drop Both Eyes QHS  . mouth rinse  15 mL Mouth Rinse BID  . mirtazapine  30 mg Oral QHS  . pravastatin  40 mg Oral QHS  . predniSONE  2.5 mg Oral Q breakfast  . sodium chloride flush  3 mL Intravenous Q12H  . Vitamin D (Ergocalciferol)  50,000 Units Oral Q7 days   Continuous Infusions: . sodium chloride 250 mL (10/02/19 1821)  . cefTRIAXone (ROCEPHIN)  IV       LOS: 1 day    Time spent: 35 minutes    Sidney Ace, MD [facility] Pager [phone]  If 7PM-7AM, please contact night-coverage 10/04/2019, 3:25 PM

## 2019-10-05 LAB — BASIC METABOLIC PANEL
Anion gap: 13 (ref 5–15)
BUN: 24 mg/dL — ABNORMAL HIGH (ref 8–23)
CO2: 26 mmol/L (ref 22–32)
Calcium: 8.4 mg/dL — ABNORMAL LOW (ref 8.9–10.3)
Chloride: 104 mmol/L (ref 98–111)
Creatinine, Ser: 1.1 mg/dL — ABNORMAL HIGH (ref 0.44–1.00)
GFR calc Af Amer: 58 mL/min — ABNORMAL LOW (ref >60)
GFR calc non Af Amer: 50 mL/min — ABNORMAL LOW (ref >60)
Glucose, Bld: 95 mg/dL (ref 70–99)
Potassium: 4.5 mmol/L (ref 3.5–5.1)
Sodium: 143 mmol/L (ref 135–145)

## 2019-10-05 LAB — CBC
HCT: 34.4 % — ABNORMAL LOW (ref 36.0–46.0)
Hemoglobin: 10.2 g/dL — ABNORMAL LOW (ref 12.0–15.0)
MCH: 31.5 pg (ref 26.0–34.0)
MCHC: 29.7 g/dL — ABNORMAL LOW (ref 30.0–36.0)
MCV: 106.2 fL — ABNORMAL HIGH (ref 80.0–100.0)
Platelets: 222 10*3/uL (ref 150–400)
RBC: 3.24 MIL/uL — ABNORMAL LOW (ref 3.87–5.11)
RDW: 14.9 % (ref 11.5–15.5)
WBC: 8.1 10*3/uL (ref 4.0–10.5)
nRBC: 0 % (ref 0.0–0.2)

## 2019-10-05 LAB — PHOSPHORUS: Phosphorus: 3.1 mg/dL (ref 2.5–4.6)

## 2019-10-05 LAB — MAGNESIUM: Magnesium: 2.2 mg/dL (ref 1.7–2.4)

## 2019-10-05 MED ORDER — IPRATROPIUM-ALBUTEROL 0.5-2.5 (3) MG/3ML IN SOLN: 3.0000 mL | Freq: Four times a day (QID) | RESPIRATORY_TRACT | Status: DC | PRN

## 2019-10-05 MED ORDER — FUROSEMIDE 10 MG/ML IJ SOLN
20.0000 mg | Freq: Once | INTRAMUSCULAR | Status: AC
  Administered 2019-10-05: 20 mg via INTRAVENOUS
  Filled 2019-10-05: qty 2

## 2019-10-05 NOTE — Progress Notes (Signed)
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 fib on amiodarone, not on anticoagulation due to fall risk, RA on methotrexate and chronic prednisone who presents to the emergency room with a 3-day history of feeling unwell with cough congestion and shortness of breath, all of which acutely worsenedafter she got her Covid shot on the day of arrival. She denied chest pain, fever or chills. Denied GI or GU symptoms  3/20: Patient seen and examined.  Irritated this morning.  Endorsing some disagreements with the nursing staff.  Difficult to redirect.  Reiterated that our only intention whether nursing, doctors, or techs to treat acute illness and stabilized for discharge.  3/21: Patient seen and examined.  Sleeping this morning.  easily arousable.  Answers all questions appropriately.  Reports some symptomatic improvement.    Assessment & Plan:   Principal Problem:   Acute on chronic combined systolic and diastolic CHF (congestive heart failure) (HCC) Active Problems:   Essential (primary) hypertension   Rheumatoid arthritis with rheumatoid factor (HCC)   Elevated troponin   AF (paroxysmal atrial fibrillation) (HCC)   Chronic kidney disease (CKD), active medical management without dialysis, stage 3 (moderate)   Acute respiratory failure with hypoxia (HCC)   CAD (coronary artery disease)   Non-ischemic cardiomyopathy (HCC)   CHF (congestive heart failure) (HCC)   Elevated liver enzymes   CAP (community acquired pneumonia)   Hypokalemia  Acute respiratory failure with hypoxia due to community-acquired multifocal pneumonia COVID-19 negative, Procalcitonin was 0.25 CT chest shows multifocal pneumonia S/p Levaquin  750 mg IV x 1 dose 3/19 started ceftriaxone and azithromycin as per pharmacy Plan: Continue rocephin  Continue zithromax Wean oxygen as tolerated Mucinex twice daily, Robitussin-DM as needed, every 6 hourly as needed Continue incentive spirometry and flutter valve Follow blood culture NGTD and sputum culture  Acute on chronic combined systolic and diastolic CHF (congestive heart failure) ([place]) Non-ischemic cardiomyopathy ([place]) -Patient presenting with shortness of breath. BNP 2680, troponin 68 and chest x-ray showing pulmonary vascular congestion/fluid overload -Last EF on file was 20 to 25% -Elevated troponin suspect mostly related to demand ischemia from heart failure exacerbation Patient 5.6 L net negative since admission.  Weight down 4 kg Plan:  Continue IV Lasix for today.  20 mg IV x 1 Consider dose decreased to 20 mg p.o. daily (home dose) starting tomorrow Reassess kidney function and need for continued diuresis on a daily basis Daily weights Strict I's and O's Continue Coreg Continue amiodarone   Hypokalemia  most likely secondary to diuretics Potassium repleted, continue to monitor and replete as needed.  Elevated troponin CAD (coronary artery disease) -No complaints of chest pain, EKG nonacute but troponin elevated at 68>79>72 -No significant delta, no chest pain, no need cycle at this time -Carvedilol and aspirin. As well as pravastatin   Essential (primary) hypertension -Continue Coreg  Rheumatoid arthritis with rheumatoid factor (HCC) -Continue methotrexate and prednisone  AF (paroxysmal atrial fibrillation) (HCC) -Continue amiodarone and Coreg -Not currently on systemic anticoagulation due to fall risk -Patient follows with Dr. Ubaldo Glassing -We will reach out to cardiology if patient not responding to heart failure treatment  Chronic kidney disease stage IIIa -At baseline. Continue to monitor  Elevated liver enzymes -Possibly related  to hepatic  congestion from heart failure exacerbation  --Elevated TSH 5.2, slightly elevated free T4 1.57 Recommend to repeat TSH level after 4 to 6 weeks and follow with PCP.  Vitamin D deficiency, start vitamin D 50,000 units p.o. weekly for 8 weeks, prescribed on discharge.    Vitamin B12 deficiency, started vitamin B12 IM injection 1000 units daily until patient has in the hospital then start oral supplement on discharge. Macrocytosis, folate within normal range  DVT prophylaxis: Lovenox Code Status: Full Family Communication: Offered to call patient's family or friends, patient declined Disposition Plan: Anticipate discharge to skilled nursing facility.  I had a lengthy conversation with the patient about this.  She is agreeable to skilled nursing facility placement.  Patient lives alone and does not have enough support structure.  Current barriers to discharge include fluid overload consistent with acute decompensated heart failure.  Also with continued hypoxic respiratory failure.  Both of these are improving.  If we are able to taper wean from supplemental oxygen entirely and patient continues to diurese and returns to dry weight she may be stable for discharge within 24 hours.   Consultants:   none  Procedures:   none  Antimicrobials:   Levaquin   Subjective: Patient seen and examined Asleep, easily awakened Pleasant this morning, no complaints  Objective: Vitals:   10/04/19 2026 10/05/19 0431 10/05/19 0824 10/05/19 1157  BP: (!) 105/58 108/64 (!) 105/59 95/60  Pulse: 74 71 76 74  Resp: 20 20 16 17   Temp: 98 F (36.7 C) 98 F (36.7 C) 97.8 F (36.6 C) 97.6 F (36.4 C)  TempSrc: Oral Oral    SpO2: 96% 97% 95% 96%  Weight:  42.7 kg    Height:        Intake/Output Summary (Last 24 hours) at 10/05/2019 1320 Last data filed at 10/05/2019 1157 Gross per 24 hour  Intake 9.04 ml  Output 1500 ml  Net -1490.96 ml   Filed Weights   10/03/19 1222 10/04/19 0500  10/05/19 0431  Weight: 46.4 kg 46.2 kg 42.7 kg    Examination:  General exam: Calm, no distress, appears chronically ill Respiratory system: C scattered crackles bilaterally.  Normal work of breathing.  Scattered wheeze.  Decreased air entry bilaterally Cardiovascular system: S1-S2 heard, RRR, no murmurs, no pedal edema gastrointestinal system: Scaphoid, nontender, nondistended, positive bowel sounds Central nervous system: Alert and oriented. No focal neurological deficits. Extremities: Symmetric 5 x 5 power. Skin: Pale, thin, no rashes or lesions Psychiatry: Normal affect    Data Reviewed: I have personally reviewed following labs and imaging studies  CBC: Recent Labs  Lab 10/02/19 0229 10/03/19 0450 10/04/19 0506 10/05/19 0451  WBC 11.9* 11.5* 7.9 8.1  NEUTROABS 10.1*  --   --   --   HGB 11.2* 9.9* 9.6* 10.2*  HCT 36.7 31.4* 32.3* 34.4*  MCV 104.0* 102.3* 107.7* 106.2*  PLT 269 215 213 [id number]   Basic Metabolic Panel: Recent Labs  Lab 10/02/19 0229 10/03/19 0450 10/04/19 0506 10/05/19 0451  NA 139 140 143 143  K 3.5 2.9* 4.6 4.5  CL 107 104 110 104  CO2 23 27 30 26   GLUCOSE 121* 121* 109* 95  BUN 17 27* <5* 24*  CREATININE 1.14* 1.18* 1.13* 1.10*  CALCIUM 8.1* 7.7* 7.9* 8.4*  MG  --  2.0 2.3 2.2  PHOS  --  2.6 2.6 3.1   GFR: Estimated Creatinine Clearance: 31.2 mL/min (A) (by C-G formula based on SCr of 1.1 mg/dL (H)). Liver Function  Tests: Recent Labs  Lab 10/02/19 0229  AST 45*  ALT 21  ALKPHOS 108  BILITOT 2.0*  PROT 6.7  ALBUMIN 3.2*   No results for input(s): LIPASE, AMYLASE in the last 168 hours. No results for input(s): AMMONIA in the last 168 hours. Coagulation Profile: No results for input(s): INR, PROTIME in the last 168 hours. Cardiac Enzymes: No results for input(s): CKTOTAL, CKMB, CKMBINDEX, TROPONINI in the last 168 hours. BNP (last 3 results) No results for input(s): PROBNP in the last 8760 hours. HbA1C: No results for input(s):  HGBA1C in the last 72 hours. CBG: No results for input(s): GLUCAP in the last 168 hours. Lipid Profile: No results for input(s): CHOL, HDL, LDLCALC, TRIG, CHOLHDL, LDLDIRECT in the last 72 hours. Thyroid Function Tests: Recent Labs    10/04/19 0506  TSH 3.943  FREET4 1.41*   Anemia Panel: Recent Labs    10/03/19 0836  VITAMINB12 118*  FOLATE 28.0  FERRITIN 87  TIBC 221*  IRON 26*   Sepsis Labs: Recent Labs  Lab 10/02/19 0229  PROCALCITON 0.25    Recent Results (from the past 240 hour(s))  Respiratory Panel by RT PCR (Flu A&B, Covid) - Nasopharyngeal Swab     Status: None   Collection Time: 10/02/19  4:22 AM   Specimen: Nasopharyngeal Swab  Result Value Ref Range Status   SARS Coronavirus 2 by RT PCR NEGATIVE NEGATIVE Final    Comment: (NOTE) SARS-CoV-2 target nucleic acids are NOT DETECTED. The SARS-CoV-2 RNA is generally detectable in upper respiratoy specimens during the acute phase of infection. The lowest concentration of SARS-CoV-2 viral copies this assay can detect is 131 copies/mL. A negative result does not preclude SARS-Cov-2 infection and should not be used as the sole basis for treatment or other patient management decisions. A negative result may occur with  improper specimen collection/handling, submission of specimen other than nasopharyngeal swab, presence of viral mutation(s) within the areas targeted by this assay, and inadequate number of viral copies (<131 copies/mL). A negative result must be combined with clinical observations, patient history, and epidemiological information. The expected result is Negative. Fact Sheet for Patients:  PinkCheek.be Fact Sheet for Healthcare Providers:  GravelBags.it This test is not yet ap proved or cleared by the [place] FDA and  has been authorized for detection and/or diagnosis of SARS-CoV-2 by FDA under an Emergency Use Authorization (EUA).  This EUA will remain  in effect (meaning this test can be used) for the duration of the COVID-19 declaration under Section 564(b)(1) of the Act, 21 U.S.C. section 360bbb-3(b)(1), unless the authorization is terminated or revoked sooner.    Influenza A by PCR NEGATIVE NEGATIVE Final   Influenza B by PCR NEGATIVE NEGATIVE Final    Comment: (NOTE) The Xpert Xpress SARS-CoV-2/FLU/RSV assay is intended as an aid in  the diagnosis of influenza from Nasopharyngeal swab specimens and  should not be used as a sole basis for treatment. Nasal washings and  aspirates are unacceptable for Xpert Xpress SARS-CoV-2/FLU/RSV  testing. Fact Sheet for Patients: PinkCheek.be Fact Sheet for Healthcare Providers: GravelBags.it This test is not yet approved or cleared by the [place] FDA and  has been authorized for detection and/or diagnosis of SARS-CoV-2 by  FDA under an Emergency Use Authorization (EUA). This EUA will remain  in effect (meaning this test can be used) for the duration of the  Covid-19 declaration under Section 564(b)(1) of the Act, 21  U.S.C. section 360bbb-3(b)(1), unless the authorization is  terminated  or revoked. Performed at [facility], [address] [place], [place] [address]   CULTURE, BLOOD (ROUTINE X 2) w Reflex to ID Panel     Status: None (Preliminary result)   Collection Time: 10/02/19  4:24 PM   Specimen: BLOOD  Result Value Ref Range Status   Specimen Description BLOOD BLOOD LEFT HAND  Final   Special Requests   Final    BOTTLES DRAWN AEROBIC AND ANAEROBIC Blood Culture adequate volume   Culture   Final    NO GROWTH 3 DAYS Performed at [facility], [address] [place], [place] [address]    Report Status PENDING  Incomplete  CULTURE, BLOOD (ROUTINE X 2) w Reflex to ID Panel     Status: None (Preliminary result)   Collection Time: 10/02/19  4:24 PM   Specimen: BLOOD  Result  Value Ref Range Status   Specimen Description BLOOD BLOOD LEFT WRIST  Final   Special Requests   Final    BOTTLES DRAWN AEROBIC AND ANAEROBIC Blood Culture adequate volume   Culture   Final    NO GROWTH 3 DAYS Performed at [facility], [address], [place],  [address]    Report Status PENDING  Incomplete         Radiology Studies: No results found.      Scheduled Meds: . amiodarone  200 mg Oral Daily  . vitamin C  500 mg Oral Daily  . aspirin EC  81 mg Oral Daily  . azithromycin  500 mg Oral Daily  . calcium carbonate  1,250 mg Oral Daily  . carvedilol  3.125 mg Oral BID  . cholecalciferol  1,000 Units Oral Daily  . collagenase  1 application Topical Daily  . cyanocobalamin  1,000 mcg Intramuscular Daily  . enoxaparin (LOVENOX) injection  40 mg Subcutaneous Q24H  . feeding supplement (ENSURE ENLIVE)  1 Bottle Oral TID BM  . folic acid  1 mg Oral Daily  . guaiFENesin  600 mg Oral BID  . hydroxychloroquine  400 mg Oral Daily  . iron polysaccharides  150 mg Oral Daily  . latanoprost  1 drop Both Eyes QHS  . mouth rinse  15 mL Mouth Rinse BID  . mirtazapine  30 mg Oral QHS  . pravastatin  40 mg Oral QHS  . predniSONE  2.5 mg Oral Q breakfast  . sodium chloride flush  3 mL Intravenous Q12H  . Vitamin D (Ergocalciferol)  50,000 Units Oral Q7 days   Continuous Infusions: . sodium chloride Stopped (10/05/19 0426)  . cefTRIAXone (ROCEPHIN)  IV Stopped (10/05/19 0426)     LOS: 2 days    Time spent: 35 minutes    Sidney Ace, MD [facility] Pager [phone]  If 7PM-7AM, please contact night-coverage 10/05/2019, 1:20 PM

## 2019-10-06 DIAGNOSIS — J9601 Acute respiratory failure with hypoxia: Secondary | ICD-10-CM

## 2019-10-06 DIAGNOSIS — J189 Pneumonia, unspecified organism: Secondary | ICD-10-CM

## 2019-10-06 LAB — BASIC METABOLIC PANEL
Anion gap: 7 (ref 5–15)
BUN: 22 mg/dL (ref 8–23)
CO2: 27 mmol/L (ref 22–32)
Calcium: 8.2 mg/dL — ABNORMAL LOW (ref 8.9–10.3)
Chloride: 106 mmol/L (ref 98–111)
Creatinine, Ser: 1.08 mg/dL — ABNORMAL HIGH (ref 0.44–1.00)
GFR calc Af Amer: 59 mL/min — ABNORMAL LOW (ref >60)
GFR calc non Af Amer: 51 mL/min — ABNORMAL LOW (ref >60)
Glucose, Bld: 90 mg/dL (ref 70–99)
Potassium: 4.3 mmol/L (ref 3.5–5.1)
Sodium: 140 mmol/L (ref 135–145)

## 2019-10-06 LAB — CBC
HCT: 32.8 % — ABNORMAL LOW (ref 36.0–46.0)
Hemoglobin: 9.8 g/dL — ABNORMAL LOW (ref 12.0–15.0)
MCH: 31.4 pg (ref 26.0–34.0)
MCHC: 29.9 g/dL — ABNORMAL LOW (ref 30.0–36.0)
MCV: 105.1 fL — ABNORMAL HIGH (ref 80.0–100.0)
Platelets: 237 10*3/uL (ref 150–400)
RBC: 3.12 MIL/uL — ABNORMAL LOW (ref 3.87–5.11)
RDW: 14.6 % (ref 11.5–15.5)
WBC: 7 10*3/uL (ref 4.0–10.5)
nRBC: 0 % (ref 0.0–0.2)

## 2019-10-06 NOTE — Progress Notes (Signed)
Physical Therapy Treatment Patient Details Name: Tina Salinas MRN: [id number] DOB: 04-05-1946 Today's Date: 10/06/2019    History of Present Illness Tina Salinas is a 74 y.o. 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 fib on amiodarone, not on anticoagulation due to fall risk, RA on methotrexate and chronic prednisone who presents to the emergency room with a 3-day history of feeling unwell with cough congestion and shortness of breath, all of which acutely worsened after she got her Covid shot on the day of arrival.    PT Comments    Upon PT arrival, pt resting in bed and reporting wanting to get to the chair today.  Max assist semi-supine to sitting edge of bed; mod assist progressing to min assist and then CGA with sitting balance; max assist x1 to stand up to RW (pt unable to maintain standing with 1 assist either attempts); and then max assist stand pivot bed to recliner with B knees blocked.  Pt reporting being uncomfortable on her "tailbone" in the chair but stated she did not want to get back to bed.  Pt repositioned in chair per pt preference with pt resting towards R side and supported with pillows.  NT notified of pt's position and pt's request for purewick.  Will continue to focus on strengthening and progressive functional mobility per pt tolerance.    Follow Up Recommendations  SNF     Equipment Recommendations  Rolling walker with 5" wheels;3in1 (PT);Wheelchair (measurements PT);Wheelchair cushion (measurements PT)    Recommendations for Other Services       Precautions / Restrictions Precautions Precautions: Fall Restrictions Weight Bearing Restrictions: No    Mobility  Bed Mobility Overal bed mobility: Needs Assistance Bed Mobility: Supine to Sit     Supine to sit: Max assist     General bed mobility comments: assist for trunk and B  LE's semi-supine to sitting edge of bed (pt initially attempting to perform on own but eventually required assist from therapist to perform bed mobility)  Transfers Overall transfer level: Needs assistance Equipment used: Rolling walker (2 wheeled) Transfers: Sit to/from [facility] Sit to Stand: Max assist Stand pivot transfers: Max assist       General transfer comment: x2 trials sit to stand from bed with RW with max assist to initiate stand and come to standing (and then to shift weight forward d/t posterior lean); x2 attempts required for stand pivot bed to recliner to R with B knees blocked and vc's for technique  Ambulation/Gait             General Gait Details: unable to at this time   Stairs             Wheelchair Mobility    Modified Rankin (Stroke Patients Only)       Balance Overall balance assessment: Needs assistance Sitting-balance support: Bilateral upper extremity supported;Feet supported Sitting balance-Leahy Scale: Poor Sitting balance - Comments: pt tending to lean posterior or anterior requiring CGA to min to mod assist for sitting balance Postural control: Posterior lean;Other (comment)(anterior lean) Standing balance support: Bilateral upper extremity supported Standing balance-Leahy Scale: Poor Standing balance comment: posterior lean noted in standing requiring assist to shift weight forward                            Cognition Arousal/Alertness: Awake/alert Behavior  During Therapy: Anxious Overall Cognitive Status: Within Functional Limits for tasks assessed                                        Exercises      General Comments   Nursing cleared pt for participation in physical therapy.  Pt agreeable to PT session.      Pertinent Vitals/Pain Pain Assessment: Faces Pain Location: pt's "tail-bone" Pain Descriptors / Indicators: Sore Pain Intervention(s): Limited activity within  patient's tolerance;Monitored during session;Repositioned  Vitals (HR and O2 on 1 L via nasal cannula) stable and WFL throughout treatment session.    Home Living                      Prior Function            PT Goals (current goals can now be found in the care plan section) Acute Rehab PT Goals Patient Stated Goal: to go home PT Goal Formulation: With patient Time For Goal Achievement: 10/20/19 Potential to Achieve Goals: Fair Progress towards PT goals: Progressing toward goals    Frequency    Min 2X/week      PT Plan Current plan remains appropriate    Co-evaluation              AM-PAC PT "6 Clicks" Mobility   Outcome Measure  Help needed turning from your back to your side while in a flat bed without using bedrails?: A Lot Help needed moving from lying on your back to sitting on the side of a flat bed without using bedrails?: A Lot Help needed moving to and from a bed to a chair (including a wheelchair)?: A Lot Help needed standing up from a chair using your arms (e.g., wheelchair or bedside chair)?: A Lot Help needed to walk in hospital room?: Total Help needed climbing 3-5 steps with a railing? : Total 6 Click Score: 10    End of Session Equipment Utilized During Treatment: Gait belt Activity Tolerance: Patient limited by fatigue Patient left: in chair;with call bell/phone within reach;with chair alarm set Nurse Communication: Mobility status;Precautions;Other (comment)(pt requesting purewick be replaced) PT Visit Diagnosis: Muscle weakness (generalized) (M62.81);Difficulty in walking, not elsewhere classified (R26.2)     Time: [id number] PT Time Calculation (min) (ACUTE ONLY): 53 min  Charges:  $Therapeutic Activity: 53-67 mins                     Leitha Bleak, PT 10/06/19, 4:28 PM

## 2019-10-06 NOTE — Progress Notes (Addendum)
Progress Note    Tina Salinas  [id number] DOB: [birth year]  DOA: 10/02/2019 PCP: Birdie Sons, MD      Brief Narrative:    Medical records reviewed and are as summarized below:  Tina Salinas is an 74 y.o. 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 fib on amiodarone, not on anticoagulation due to fall risk, RA on methotrexate and chronic prednisone who presents to the emergency room with a 3-day history of feeling unwell with cough congestion and shortness of breath, all of which acutely worsenedafter she got her Covid shot on the day of arrival      Assessment/Plan:   Principal Problem:   Acute on chronic combined systolic and diastolic CHF (congestive heart failure) (HCC) Active Problems:   Essential (primary) hypertension   Rheumatoid arthritis with rheumatoid factor (HCC)   Elevated troponin   AF (paroxysmal atrial fibrillation) (HCC)   Chronic kidney disease (CKD), active medical management without dialysis, stage 3 (moderate)   Acute respiratory failure with hypoxia (HCC)   CAD (coronary artery disease)   Non-ischemic cardiomyopathy ([place])   CHF (congestive heart failure) (HCC)   Elevated liver enzymes   CAP (community acquired pneumonia)   Hypokalemia   Acute respiratory failure with hypoxia due to community-acquired multifocal pneumonia COVID-19 negative, Procalcitonin was 0.25 CT chest shows multifocal pneumonia S/pLevaquin 750 mg IVx 1 dose 3/19started ceftriaxone and azithromycin as per pharmacy Plan: Continue rocephin  Continue zithromax Wean oxygen as tolerated Mucinex twice daily, Robitussin-DM as needed, every 6 hourly as needed Continue incentive spirometry and flutter valve Follow blood cultureNGTDand sputum culture  Acute on chronic combined systolic and diastolic CHF (congestive heart failure)  ([place]) Non-ischemic cardiomyopathy ([place]) -Patient presenting with shortness of breath. BNP 2680, troponin 68 and chest x-ray showing pulmonary vascular congestion/fluid overload -Last EF on file was 20 to 25% -Elevated troponin suspect mostly related to demand ischemia from heart failure exacerbation Patient 7.5 L net negative since admission.  Weight down 4 kg Plan:  Start PO Lasix Consider dose decreased to 20 mg p.o. daily (home dose) starting tomorrow Reassess kidney function and need for continued diuresis on a daily basis Daily weights Strict I's and O's Continue Coreg Continue amiodarone  Patient requested to see Dr. Ubaldo Glassing, cardiologist.  She was informed that Dr. Ubaldo Glassing was not on-call and he was unavailable.  She did not want to see anybody else apart from Dr. Ubaldo Glassing.  Hypokalemia most likely secondary to diuretics Potassium repleted, continue to monitor and replete as needed.  Elevated troponin CAD (coronary artery disease) -No complaints of chest pain, EKG nonacute but troponin elevated at 68>79>72 -No significant delta, no chest pain, no need cycle at this time -Carvedilol and aspirin. As well as pravastatin   Essential (primary) hypertension -Continue Coreg  Rheumatoid arthritis with rheumatoid factor (HCC) -Continue methotrexate and prednisone  AF (paroxysmal atrial fibrillation) (HCC) -Continue amiodarone and Coreg -Not currently on systemic anticoagulation due to fall risk -Patient follows with Dr. Ubaldo Glassing -We will reach out to cardiology if patient not responding to heart failure treatment  Chronic kidney disease stage IIIa -At baseline. Continue to monitor  Elevated liver enzymes -Possibly related to hepatic congestion from heart failure exacerbation  --Elevated TSH 5.2, slightly elevated free T4 1.57 Recommend to repeat TSH level after 4 to 6 weeks and follow with PCP.  Vitamin D deficiency,start vitamin D 50,000 units  p.o. weekly for  8 weeks,prescribed on discharge.   Vitamin B12 deficiency, started vitamin B12 IM injection 1000 units daily until patient has in the hospital then start oral supplement on discharge. Macrocytosis, folate within normal range     Body mass index is 18.26 kg/m.   Family Communication/Anticipated D/C date and plan/Code Status   DVT prophylaxis: Lovenox Code Status: Full code Family Communication: Plan discussed with patient Disposition Plan: Patient is from home.  Plan to discharge to SNF based on PTs recommendation.  However, patient insisted on going home rather than to SNF despite increased fall risk.      Subjective:   C/o right-sided pleuritic chest pain from coughing  Objective:     Vitals:   10/06/19 1551  BP: (!) 106/58  Pulse: 75  Resp: 16  Temp: 98 F (36.7 C)  TempSrc: Oral  SpO2: 97%  Weight:   Height:      Exam:  GEN: NAD, cachetic SKIN: No rash EYES: EOMI ENT: MMM CV: RRR PULM: CTA B ABD: soft, ND, NT, +BS CNS: AAO x 3, non focal EXT: No edema or tenderness MSK:: Tenderness along right upper chest wall  Data Reviewed:   I have personally reviewed following labs and imaging studies:  Labs: Labs show the following:   Basic Metabolic Panel: Lab [id number] 10/03/19 0450 10/04/19 0506 10/04/19 0506 10/05/19 0451 10/05/19 0451 10/06/19 0430  NA 140  --  143  --  143  --  140  K 2.9*   < > 4.6   < > 4.5   < > 4.3  CL 104  --  110  --  104  --  106  CO2 27  --  30  --  26  --  27  GLUCOSE 121*  --  109*  --  95  --  90  BUN 27*  --  <5*  --  24*  --  22  CREATININE 1.18*  --  1.13*  --  1.10*  --  1.08*  CALCIUM 7.7*  --  7.9*  --  8.4*  --  8.2*  MG 2.0  --  2.3  --  2.2  --   --   PHOS 2.6  --  2.6  --  3.1  --   --    GFR Estimated Creatinine Clearance: 31.2 mL/min (A) (by C-G formula based on SCr of 1.09 mg/dL (H)). Liver Function Tests: Recent Labs  Lab 10/02/19 0229  AST 45*  ALT 21  ALKPHOS 108  BILITOT  2.0*  PROT 6.7  ALBUMIN 3.2*   No results for input(s): LIPASE, AMYLASE in the last 168 hours. No results for input(s): AMMONIA in the last 168 hours. Coagulation profile No results for input(s): INR, PROTIME in the last 168 hours.  CBC: Recent Labs  Lab 10/02/19 0229 10/02/19 0229 10/03/19 0450 10/04/19 0506 10/05/19 0451 10/06/19 0430 10/07/19 0508  WBC 11.9*   < > 11.5* 7.9 8.1 7.0 8.6  NEUTROABS 10.1*  --   --   --   --   --   --   HGB 11.2*   < > 9.9* 9.6* 10.2* 9.8* 10.6*  HCT 36.7   < > 31.4* 32.3* 34.4* 32.8* 33.8*  MCV 104.0*   < > 102.3* 107.7* 106.2* 105.1* 103.0*  PLT 269   < > 215 213 222 237 249   < > = values in this interval not displayed.   Cardiac Enzymes: No results for  input(s): CKTOTAL, CKMB, CKMBINDEX, TROPONINI in the last 168 hours. BNP (last 3 results) No results for input(s): PROBNP in the last 8760 hours. CBG: No results for input(s): GLUCAP in the last 168 hours. D-Dimer: No results for input(s): DDIMER in the last 72 hours. Hgb A1c: No results for input(s): HGBA1C in the last 72 hours. Lipid Profile: No results for input(s): CHOL, HDL, LDLCALC, TRIG, CHOLHDL, LDLDIRECT in the last 72 hours. Thyroid function studies: No results for input(s): TSH, T4TOTAL, T3FREE, THYROIDAB in the last 72 hours.  Invalid input(s): FREET3 Anemia work up: No results for input(s): VITAMINB12, FOLATE, FERRITIN, TIBC, IRON, RETICCTPCT in the last 72 hours. Sepsis Labs: Recent Labs  Lab 10/02/19 0229 10/03/19 0450 10/04/19 0506 10/05/19 0451 10/06/19 0430 10/07/19 0508  PROCALCITON 0.25  --   --   --   --   --   WBC 11.9*   < > 7.9 8.1 7.0 8.6   < > = values in this interval not displayed.    Microbiology Recent Results (from the past 240 hour(s))  Respiratory Panel by RT PCR (Flu A&B, Covid) - Nasopharyngeal Swab     Status: None   Collection Time: 10/02/19  4:22 AM   Specimen: Nasopharyngeal Swab  Result Value Ref Range Status   SARS Coronavirus  2 by RT PCR NEGATIVE NEGATIVE Final    Comment: (NOTE) SARS-CoV-2 target nucleic acids are NOT DETECTED. The SARS-CoV-2 RNA is generally detectable in upper respiratoy specimens during the acute phase of infection. The lowest concentration of SARS-CoV-2 viral copies this assay can detect is 131 copies/mL. A negative result does not preclude SARS-Cov-2 infection and should not be used as the sole basis for treatment or other patient management decisions. A negative result may occur with  improper specimen collection/handling, submission of specimen other than nasopharyngeal swab, presence of viral mutation(s) within the areas targeted by this assay, and inadequate number of viral copies (<131 copies/mL). A negative result must be combined with clinical observations, patient history, and epidemiological information. The expected result is Negative. Fact Sheet for Patients:  PinkCheek.be Fact Sheet for Healthcare Providers:  GravelBags.it This test is not yet ap proved or cleared by the [place] FDA and  has been authorized for detection and/or diagnosis of SARS-CoV-2 by FDA under an Emergency Use Authorization (EUA). This EUA will remain  in effect (meaning this test can be used) for the duration of the COVID-19 declaration under Section 564(b)(1) of the Act, 21 U.S.C. section 360bbb-3(b)(1), unless the authorization is terminated or revoked sooner.    Influenza A by PCR NEGATIVE NEGATIVE Final   Influenza B by PCR NEGATIVE NEGATIVE Final    Comment: (NOTE) The Xpert Xpress SARS-CoV-2/FLU/RSV assay is intended as an aid in  the diagnosis of influenza from Nasopharyngeal swab specimens and  should not be used as a sole basis for treatment. Nasal washings and  aspirates are unacceptable for Xpert Xpress SARS-CoV-2/FLU/RSV  testing. Fact Sheet for Patients: PinkCheek.be Fact Sheet for Healthcare  Providers: GravelBags.it This test is not yet approved or cleared by the [place] FDA and  has been authorized for detection and/or diagnosis of SARS-CoV-2 by  FDA under an Emergency Use Authorization (EUA). This EUA will remain  in effect (meaning this test can be used) for the duration of the  Covid-19 declaration under Section 564(b)(1) of the Act, 21  U.S.C. section 360bbb-3(b)(1), unless the authorization is  terminated or revoked. Performed at [facility], [address] [place],  [place] [address]   CULTURE, BLOOD (ROUTINE X 2) w Reflex to ID Panel     Status: None   Collection Time: 10/02/19  4:24 PM   Specimen: BLOOD  Result Value Ref Range Status   Specimen Description BLOOD BLOOD LEFT HAND  Final   Special Requests   Final    BOTTLES DRAWN AEROBIC AND ANAEROBIC Blood Culture adequate volume   Culture   Final    NO GROWTH 5 DAYS Performed at [facility], [address] [place], [place] [address]    Report Status 10/07/2019 FINAL  Final  CULTURE, BLOOD (ROUTINE X 2) w Reflex to ID Panel     Status: None   Collection Time: 10/02/19  4:24 PM   Specimen: BLOOD  Result Value Ref Range Status   Specimen Description BLOOD BLOOD LEFT WRIST  Final   Special Requests   Final    BOTTLES DRAWN AEROBIC AND ANAEROBIC Blood Culture adequate volume   Culture   Final    NO GROWTH 5 DAYS Performed at [facility], [address], [place], [place] [address]    Report Status 10/07/2019 FINAL  Final    Procedures and diagnostic studies:  No results found.  Medications:   . amiodarone  200 mg Oral Daily  . vitamin C  500 mg Oral Daily  . aspirin EC  81 mg Oral Daily  . calcium carbonate  1,250 mg Oral Daily  . carvedilol  3.125 mg Oral BID  . cholecalciferol  1,000 Units Oral Daily  . collagenase  1 application Topical Daily  . cyanocobalamin  1,000 mcg Intramuscular Daily  . enoxaparin (LOVENOX) injection  40  mg Subcutaneous Q24H  . feeding supplement (ENSURE ENLIVE)  1 Bottle Oral TID BM  . folic acid  1 mg Oral Daily  . guaiFENesin  600 mg Oral BID  . hydroxychloroquine  400 mg Oral Daily  . iron polysaccharides  150 mg Oral Daily  . latanoprost  1 drop Both Eyes QHS  . mouth rinse  15 mL Mouth Rinse BID  . mirtazapine  30 mg Oral QHS  . pravastatin  40 mg Oral QHS  . predniSONE  2.5 mg Oral Q breakfast  . sodium chloride flush  3 mL Intravenous Q12H  . Vitamin D (Ergocalciferol)  50,000 Units Oral Q7 days   Continuous Infusions: . sodium chloride Stopped (10/04/19 2053)     Rainen Vanrossum  [facility]     10/07/2019, 8:18 AM
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
## 2019-10-06 NOTE — Plan of Care (Addendum)
VSS. NSR on tele. Falls precautions maintained. Up w/ max w person assist. Pt generally agitated and disagreeable. Plans for pt to dc to SNF 10/07/19. External urinary catheter in place, urine output amt adequate. 1L O2 via nasal cannula in place. Unable to wean d/t pt desat. Pt does not wear oxygen at home at baseline. POC reviewed, pt communicates understanding. No new needs/questions at this time. Sepsis   Problem: Education: Goal: Ability to demonstrate management of disease process will improve Outcome: Progressing Goal: Ability to verbalize understanding of medication therapies will improve Outcome: Progressing Goal: Individualized Educational Video(s) Outcome: Progressing   Problem: Activity: Goal: Capacity to carry out activities will improve Outcome: Progressing   Problem: Cardiac: Goal: Ability to achieve and maintain adequate cardiopulmonary perfusion will improve Outcome: Progressing   Problem: Education: Goal: Knowledge of General Education information will improve Description: Including pain rating scale, medication(s)/side effects and non-pharmacologic comfort measures Outcome: Progressing   Problem: Health Behavior/Discharge Planning: Goal: Ability to manage health-related needs will improve Outcome: Progressing   Problem: Clinical Measurements: Goal: Ability to maintain clinical measurements within normal limits will improve Outcome: Progressing Goal: Will remain free from infection Outcome: Progressing Goal: Diagnostic test results will improve Outcome: Progressing Goal: Respiratory complications will improve Outcome: Progressing Goal: Cardiovascular complication will be avoided Outcome: Progressing   Problem: Activity: Goal: Risk for activity intolerance will decrease Outcome: Progressing   Problem: Nutrition: Goal: Adequate nutrition will be maintained Outcome: Progressing   Problem: Coping: Goal: Level of anxiety will decrease Outcome:  Progressing   Problem: Elimination: Goal: Will not experience complications related to bowel motility Outcome: Progressing Goal: Will not experience complications related to urinary retention Outcome: Progressing   Problem: Pain Managment: Goal: General experience of comfort will improve Outcome: Progressing   Problem: Safety: Goal: Ability to remain free from injury will improve Outcome: Progressing   Problem: Skin Integrity: Goal: Risk for impaired skin integrity will decrease Outcome: Progressing

## 2019-10-06 NOTE — TOC Progression Note (Signed)
Transition of Care [facility]) - Progression Note    Patient Details  Name: Tina Salinas MRN: [id number] Date of Birth: 12/23/1945  Transition of Care [facility]) CM/SW Contact  Eileen Stanford, LCSW Phone Number: 10/06/2019, 2:46 PM  Clinical Narrative:  Pt now agreeable to [facility]. CSW will send referral. Pt will need prior auth.     Expected Discharge Plan: [place] Barriers to Discharge: Continued Medical Work up  Expected Discharge Plan and Services Expected Discharge Plan: [place] In-house Referral: Clinical Social Work   Post Acute Care Choice: [place] arrangements for the past 2 months: [place]: PT, OT [place] Agency: [facility] Date [place]: 10/06/19 Time [place]: [phone] Representative spoke with at [place]: [place] ([place]) Interventions    Readmission Risk Interventions No flowsheet data found.

## 2019-10-06 NOTE — Care Management Important Message (Signed)
Important Message  Patient Details  Name: Tina Salinas MRN: [id number] Date of Birth: [birth year]   Medicare Important Message Given:  Yes     [name] 10/06/2019, 11:58 AM

## 2019-10-06 NOTE — Progress Notes (Signed)
OT Cancellation Note  Patient Details Name: Tina Salinas MRN: [id number] DOB: [birth year]   Cancelled Treatment:    Reason Eval/Treat Not Completed: Fatigue/lethargy limiting ability to participate;Patient declined, no reason specified. Upon attempt, pt reporting "I'm not worth anything today" in regards to her ability to participate. Pt reports fatigue limiting her ability to partake in OT. Agreeable to attempt next date. Pt requested her SNF preferences be shared with the case mgr. Therapist sent secure chat to pt's case mgr with her preferences.   Jeni Salles, MPH, MS, OTR/L ascom [phone] 10/06/19, 3:37 PM

## 2019-10-07 DIAGNOSIS — E43 Unspecified severe protein-calorie malnutrition: Secondary | ICD-10-CM | POA: Insufficient documentation

## 2019-10-07 LAB — CBC
HCT: 33.8 % — ABNORMAL LOW (ref 36.0–46.0)
Hemoglobin: 10.6 g/dL — ABNORMAL LOW (ref 12.0–15.0)
MCH: 32.3 pg (ref 26.0–34.0)
MCHC: 31.4 g/dL (ref 30.0–36.0)
MCV: 103 fL — ABNORMAL HIGH (ref 80.0–100.0)
Platelets: 249 10*3/uL (ref 150–400)
RBC: 3.28 MIL/uL — ABNORMAL LOW (ref 3.87–5.11)
RDW: 14.6 % (ref 11.5–15.5)
WBC: 8.6 10*3/uL (ref 4.0–10.5)
nRBC: 0 % (ref 0.0–0.2)

## 2019-10-07 LAB — BASIC METABOLIC PANEL
Anion gap: 9 (ref 5–15)
BUN: 22 mg/dL (ref 8–23)
CO2: 25 mmol/L (ref 22–32)
Calcium: 8.3 mg/dL — ABNORMAL LOW (ref 8.9–10.3)
Chloride: 108 mmol/L (ref 98–111)
Creatinine, Ser: 1.09 mg/dL — ABNORMAL HIGH (ref 0.44–1.00)
GFR calc Af Amer: 59 mL/min — ABNORMAL LOW (ref >60)
GFR calc non Af Amer: 51 mL/min — ABNORMAL LOW (ref >60)
Glucose, Bld: 108 mg/dL — ABNORMAL HIGH (ref 70–99)
Potassium: 4.5 mmol/L (ref 3.5–5.1)
Sodium: 142 mmol/L (ref 135–145)

## 2019-10-07 LAB — CULTURE, BLOOD (ROUTINE X 2)
Culture: NO GROWTH
Culture: NO GROWTH
Special Requests: ADEQUATE
Special Requests: ADEQUATE

## 2019-10-07 MED ORDER — LORAZEPAM 2 MG/ML IJ SOLN: 1.0000 mg | Freq: Once | INTRAMUSCULAR | Status: DC

## 2019-10-07 MED ORDER — ADULT MULTIVITAMIN W/MINERALS CH
1.0000 | ORAL_TABLET | Freq: Every day | ORAL | Status: DC
  Administered 2019-10-08 – 2019-10-11 (×4): 1 via ORAL
  Filled 2019-10-07 (×4): qty 1

## 2019-10-07 MED ORDER — FUROSEMIDE 20 MG PO TABS
20.0000 mg | ORAL_TABLET | Freq: Every day | ORAL | Status: DC
  Administered 2019-10-07 – 2019-10-09 (×3): 20 mg via ORAL
  Filled 2019-10-07 (×5): qty 1

## 2019-10-07 MED ORDER — AMOXICILLIN-POT CLAVULANATE 875-125 MG PO TABS
1.0000 | ORAL_TABLET | Freq: Two times a day (BID) | ORAL | Status: DC
  Administered 2019-10-07 – 2019-10-09 (×5): 1 via ORAL
  Filled 2019-10-07 (×6): qty 1

## 2019-10-07 NOTE — TOC Progression Note (Signed)
Transition of Care [facility]) - Progression Note    Patient Details  Name: Tina Salinas MRN: [id number] Date of Birth: 06/21/1946  Transition of Care [facility]) CM/SW Contact  Eileen Stanford, LCSW Phone Number: 10/07/2019, 8:29 AM  Clinical Narrative:   [facility] accepted pt. [employer] Josem Kaufmann has been initiated.    Expected Discharge Plan: [place] Barriers to Discharge: Continued Medical Work up  Expected Discharge Plan and Services Expected Discharge Plan: [place] In-house Referral: Clinical Social Work   Post Acute Care Choice: [place] arrangements for the past 2 months: [place]: PT, OT [place] Agency: [facility] Date [place]: 10/06/19 Time [place]: [phone] Representative spoke with at [place]: [place] ([place]) Interventions    Readmission Risk Interventions No flowsheet data found.

## 2019-10-07 NOTE — Progress Notes (Signed)
New onset confusion and agitation at start of shift. Pt confused and angry. Attempting to "call the police so they can take me back to the hospital". Attempted to reorient pt w/ no success. Pt refusing interventions and medications. Pt agitated but in bed. Will attempt to administer evening medications later.  Provider notified. Will monitor for any increase in safety issues or increased aggression. Will continue to keep pt safety priority.

## 2019-10-07 NOTE — Progress Notes (Signed)
Progress Note    Tina Salinas  [id number] DOB: [birth year]  DOA: 10/02/2019 PCP: Birdie Sons, MD      Brief Narrative:    Medical records reviewed and are as summarized below:  Tina Salinas is an 74 y.o. 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 fib on amiodarone, not on anticoagulation due to fall risk, RA on methotrexate and chronic prednisone who presents to the emergency room with a 3-day history of feeling unwell with cough congestion and shortness of breath, all of which acutely worsenedafter she got her Covid shot on the day of arrival      Assessment/Plan:   Principal Problem:   Acute on chronic combined systolic and diastolic CHF (congestive heart failure) (HCC) Active Problems:   Essential (primary) hypertension   Rheumatoid arthritis with rheumatoid factor (HCC)   Elevated troponin   AF (paroxysmal atrial fibrillation) (HCC)   Chronic kidney disease (CKD), active medical management without dialysis, stage 3 (moderate)   Acute respiratory failure with hypoxia (HCC)   CAD (coronary artery disease)   Non-ischemic cardiomyopathy ([place])   CHF (congestive heart failure) (HCC)   Elevated liver enzymes   CAP (community acquired pneumonia)   Hypokalemia   Protein-calorie malnutrition, severe   Acute respiratory failure with hypoxia due to community-acquired multifocal pneumonia COVID-19 negative, Procalcitonin was 0.25 CT chest shows multifocal pneumonia S/pLevaquin 750 mg IVx 1 dose 3/19started ceftriaxone and azithromycin as per pharmacy Plan: Discontinue ceftriaxone and azithromycin and start Augmentin Wean oxygen as tolerated Mucinex twice daily, Robitussin-DM as needed, every 6 hourly as needed Continue incentive spirometry and flutter valve Follow blood cultureNGTDand sputum culture  Acute on chronic combined  systolic and diastolic CHF (congestive heart failure) ([place]) Non-ischemic cardiomyopathy ([place]) -Patient presenting with shortness of breath. BNP 2680, troponin 68 and chest x-ray showing pulmonary vascular congestion/fluid overload -Last EF on file was 20 to 25% -Elevated troponin suspect mostly related to demand ischemia from heart failure exacerbation Patient 8.6 L net negative since admission.  Weight down 4 kg Plan:  Continue oral Lasix Daily weights Strict I's and O's Continue Coreg Continue amiodarone   Hypokalemia most likely secondary to diuretics Potassium repleted, continue to monitor and replete as needed.  Elevated troponin CAD (coronary artery disease) -No complaints of chest pain, EKG nonacute but troponin elevated at 68>79>72 -No significant delta, no chest pain, no need cycle at this time -Carvedilol and aspirin. As well as pravastatin   Essential (primary) hypertension -Continue Coreg  Rheumatoid arthritis with rheumatoid factor (HCC) -Continue methotrexate and prednisone  AF (paroxysmal atrial fibrillation) (HCC) -Continue amiodarone and Coreg -Not currently on systemic anticoagulation due to fall risk -Patient follows with Dr. Ubaldo Glassing -We will reach out to cardiology if patient not responding to heart failure treatment  Chronic kidney disease stage IIIa -At baseline. Continue to monitor  Elevated liver enzymes -Possibly related to hepatic congestion from heart failure exacerbation  --Elevated TSH 5.2, slightly elevated free T4 1.57 Recommend to repeat TSH level after 4 to 6 weeks and follow with PCP.  Vitamin D deficiency,start vitamin D 50,000 units p.o. weekly for 8 weeks,prescribed on discharge.   Vitamin B12 deficiency, started vitamin B12 IM injection 1000 units daily until patient has in the hospital then start oral supplement on discharge. Macrocytosis, folate within normal range     Body mass index is 18.26  kg/m./Severe protein calorie malnutrition:  Encourage use of dietary supplements.  Follow-up with dietitian.   Family Communication/Anticipated D/C date and plan/Code Status   DVT prophylaxis: Lovenox Code Status: Full code Family Communication: Plan discussed with patient Disposition Plan: Patient is from home.  PT recommended discharge to SNF.  However, patient insisted on going home rather than to SNF despite increased fall risk. Plan to discharge home tomorrow if she continues to improve.     Subjective:   She still complains of right-sided pleuritic chest pain from coughing.  She feels a little short of breath today.  Objective:     Vitals:   10/06/19 1551  BP: (!) 106/58  Pulse: 75  Resp: 16  Temp: 98 F (36.7 C)  TempSrc: Oral  SpO2: 97%  Weight:   Height:      Exam:  GEN: No acute distress, cachetic SKIN: No rash EYES: No pallor or icterus ENT: MMM CV: RRR PULM: CTA B ABD: soft, ND, NT, +BS CNS: AAO x 3, non focal EXT: No edema or tenderness MSK:: Tenderness along right upper chest wall  Data Reviewed:   I have personally reviewed following labs and imaging studies:  Labs: Labs show the following:   Basic Metabolic Panel: Lab [id number] 10/03/19 0450 10/04/19 0506 10/04/19 0506 10/05/19 0451 10/05/19 0451 10/06/19 0430  NA 140  --  143  --  143  --  140  K 2.9*   < > 4.6   < > 4.5   < > 4.3  CL 104  --  110  --  104  --  106  CO2 27  --  30  --  26  --  27  GLUCOSE 121*  --  109*  --  95  --  90  BUN 27*  --  <5*  --  24*  --  22  CREATININE 1.18*  --  1.13*  --  1.10*  --  1.08*  CALCIUM 7.7*  --  7.9*  --  8.4*  --  8.2*  MG 2.0  --  2.3  --  2.2  --   --   PHOS 2.6  --  2.6  --  3.1  --   --    GFR Estimated Creatinine Clearance: 31.2 mL/min (A) (by C-G formula based on SCr of 1.09 mg/dL (H)). Liver Function Tests: Recent Labs  Lab 10/02/19 0229  AST 45*  ALT 21  ALKPHOS 108  BILITOT 2.0*  PROT 6.7  ALBUMIN 3.2*    No results for input(s): LIPASE, AMYLASE in the last 168 hours. No results for input(s): AMMONIA in the last 168 hours. Coagulation profile No results for input(s): INR, PROTIME in the last 168 hours.  CBC: Recent Labs  Lab 10/02/19 0229 10/02/19 0229 10/03/19 0450 10/04/19 0506 10/05/19 0451 10/06/19 0430 10/07/19 0508  WBC 11.9*   < > 11.5* 7.9 8.1 7.0 8.6  NEUTROABS 10.1*  --   --   --   --   --   --   HGB 11.2*   < > 9.9* 9.6* 10.2* 9.8* 10.6*  HCT 36.7   < > 31.4* 32.3* 34.4* 32.8* 33.8*  MCV 104.0*   < > 102.3* 107.7* 106.2* 105.1* 103.0*  PLT 269   < > 215 213 222 237 249   < > = values in this interval not displayed.   Cardiac Enzymes: No results for input(s): CKTOTAL, CKMB, CKMBINDEX, TROPONINI in the last 168 hours. BNP (last 3 results) No results for  input(s): PROBNP in the last 8760 hours. CBG: No results for input(s): GLUCAP in the last 168 hours. D-Dimer: No results for input(s): DDIMER in the last 72 hours. Hgb A1c: No results for input(s): HGBA1C in the last 72 hours. Lipid Profile: No results for input(s): CHOL, HDL, LDLCALC, TRIG, CHOLHDL, LDLDIRECT in the last 72 hours. Thyroid function studies: No results for input(s): TSH, T4TOTAL, T3FREE, THYROIDAB in the last 72 hours.  Invalid input(s): FREET3 Anemia work up: No results for input(s): VITAMINB12, FOLATE, FERRITIN, TIBC, IRON, RETICCTPCT in the last 72 hours. Sepsis Labs: Recent Labs  Lab 10/02/19 0229 10/03/19 0450 10/04/19 0506 10/05/19 0451 10/06/19 0430 10/07/19 0508  PROCALCITON 0.25  --   --   --   --   --   WBC 11.9*   < > 7.9 8.1 7.0 8.6   < > = values in this interval not displayed.    Microbiology Recent Results (from the past 240 hour(s))  Respiratory Panel by RT PCR (Flu A&B, Covid) - Nasopharyngeal Swab     Status: None   Collection Time: 10/02/19  4:22 AM   Specimen: Nasopharyngeal Swab  Result Value Ref Range Status   SARS Coronavirus 2 by RT PCR NEGATIVE NEGATIVE  Final    Comment: (NOTE) SARS-CoV-2 target nucleic acids are NOT DETECTED. The SARS-CoV-2 RNA is generally detectable in upper respiratoy specimens during the acute phase of infection. The lowest concentration of SARS-CoV-2 viral copies this assay can detect is 131 copies/mL. A negative result does not preclude SARS-Cov-2 infection and should not be used as the sole basis for treatment or other patient management decisions. A negative result may occur with  improper specimen collection/handling, submission of specimen other than nasopharyngeal swab, presence of viral mutation(s) within the areas targeted by this assay, and inadequate number of viral copies (<131 copies/mL). A negative result must be combined with clinical observations, patient history, and epidemiological information. The expected result is Negative. Fact Sheet for Patients:  PinkCheek.be Fact Sheet for Healthcare Providers:  GravelBags.it This test is not yet ap proved or cleared by the [place] FDA and  has been authorized for detection and/or diagnosis of SARS-CoV-2 by FDA under an Emergency Use Authorization (EUA). This EUA will remain  in effect (meaning this test can be used) for the duration of the COVID-19 declaration under Section 564(b)(1) of the Act, 21 U.S.C. section 360bbb-3(b)(1), unless the authorization is terminated or revoked sooner.    Influenza A by PCR NEGATIVE NEGATIVE Final   Influenza B by PCR NEGATIVE NEGATIVE Final    Comment: (NOTE) The Xpert Xpress SARS-CoV-2/FLU/RSV assay is intended as an aid in  the diagnosis of influenza from Nasopharyngeal swab specimens and  should not be used as a sole basis for treatment. Nasal washings and  aspirates are unacceptable for Xpert Xpress SARS-CoV-2/FLU/RSV  testing. Fact Sheet for Patients: PinkCheek.be Fact Sheet for Healthcare  Providers: GravelBags.it This test is not yet approved or cleared by the [place] FDA and  has been authorized for detection and/or diagnosis of SARS-CoV-2 by  FDA under an Emergency Use Authorization (EUA). This EUA will remain  in effect (meaning this test can be used) for the duration of the  Covid-19 declaration under Section 564(b)(1) of the Act, 21  U.S.C. section 360bbb-3(b)(1), unless the authorization is  terminated or revoked. Performed at [facility], [place], [place] [address]   CULTURE, BLOOD (ROUTINE X 2) w Reflex to ID Panel  Status: None   Collection Time: 10/02/19  4:24 PM   Specimen: BLOOD  Result Value Ref Range Status   Specimen Description BLOOD BLOOD LEFT HAND  Final   Special Requests   Final    BOTTLES DRAWN AEROBIC AND ANAEROBIC Blood Culture adequate volume   Culture   Final    NO GROWTH 5 DAYS Performed at [facility], [address] [place], [place] [address]    Report Status 10/07/2019 FINAL  Final  CULTURE, BLOOD (ROUTINE X 2) w Reflex to ID Panel     Status: None   Collection Time: 10/02/19  4:24 PM   Specimen: BLOOD  Result Value Ref Range Status   Specimen Description BLOOD BLOOD LEFT WRIST  Final   Special Requests   Final    BOTTLES DRAWN AEROBIC AND ANAEROBIC Blood Culture adequate volume   Culture   Final    NO GROWTH 5 DAYS Performed at [facility], [address], [place], [place] [address]    Report Status 10/07/2019 FINAL  Final    Procedures and diagnostic studies:  No results found.  Medications:   . amiodarone  200 mg Oral Daily  . amoxicillin-clavulanate  1 tablet Oral Q12H  . vitamin C  500 mg Oral Daily  . aspirin EC  81 mg Oral Daily  . calcium carbonate  1,250 mg Oral Daily  . carvedilol  3.125 mg Oral BID  . cholecalciferol  1,000 Units Oral Daily  . collagenase  1 application Topical Daily  . cyanocobalamin  1,000 mcg  Intramuscular Daily  . enoxaparin (LOVENOX) injection  40 mg Subcutaneous Q24H  . feeding supplement (ENSURE ENLIVE)  1 Bottle Oral TID BM  . folic acid  1 mg Oral Daily  . furosemide  20 mg Oral Daily  . guaiFENesin  600 mg Oral BID  . hydroxychloroquine  400 mg Oral Daily  . iron polysaccharides  150 mg Oral Daily  . latanoprost  1 drop Both Eyes QHS  . mouth rinse  15 mL Mouth Rinse BID  . mirtazapine  30 mg Oral QHS  . [START ON 10/08/2019] multivitamin with minerals  1 tablet Oral Daily  . pravastatin  40 mg Oral QHS  . predniSONE  2.5 mg Oral Q breakfast  . sodium chloride flush  3 mL Intravenous Q12H  . Vitamin D (Ergocalciferol)  50,000 Units Oral Q7 days   Continuous Infusions: . sodium chloride Stopped (10/04/19 2053)     Shann Merrick  [facility]     10/07/2019, 3:54 PM

## 2019-10-07 NOTE — Progress Notes (Signed)
Initial Nutrition Assessment  DOCUMENTATION CODES:   Severe malnutrition in context of chronic illness  INTERVENTION:   Ensure Enlive po TID, each supplement provides 350 kcal and 20 grams of protein  Magic cup TID with meals, each supplement provides 290 kcal and 9 grams of protein  MVI daily   Liberalize diet   NUTRITION DIAGNOSIS:   Severe Malnutrition related to chronic illness(CHF) as evidenced by severe muscle depletion, severe fat depletion.  GOAL:   Patient will meet greater than or equal to 90% of their needs  MONITOR:   PO intake, Supplement acceptance, Labs, Weight trends, Skin, I & O's  REASON FOR ASSESSMENT:   Other (Comment)(Low BMI)    ASSESSMENT:   74 y.o. 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 fib on amiodarone, not on anticoagulation due to fall risk, RA on methotrexate and chronic prednisone who presents to the emergency room with a 3-day history of feeling unwell with cough congestion and shortness of breath and was found to have PNA   Met with pt in room today. Pt reports poor appetite and oral intake at baseline. Pt reports that she does drink some Ensure daily at home. Pt reports that she is not eating well in hospital because the food tastes bad. Pt ate 1 bite of hamburger, 2-3 bites of salad and 1/2 of a Magic Cup for lunch today. Pt enjoys the orange Magic Cups and chocolate/vanilla Ensure. Per chart, pt is down 15lbs(14%) over the past couple of years and has developed severe malnutrition. Recommend continue Ensure supplements after discharge. RD will add Magic Cups and MVI daily. RD will also liberalize pt's diet as pt is not eating enough to exceed any nutrient limits.   Medications reviewed and include: augmentin, vitamin C, aspirin, oscal, D3, K81, folic acid, lasix, iron, remeron, prednisone   Labs reviewed: creat  1.09(H)  Hgb 10.6(L), Hct 33.8(L) B12- 118(L), vitamin D 18.95(L)- 3/19  NUTRITION - FOCUSED PHYSICAL EXAM:    Most Recent Value  Orbital Region  Severe depletion  Upper Arm Region  Severe depletion  Thoracic and Lumbar Region  Severe depletion  Buccal Region  Severe depletion  Temple Region  Severe depletion  Clavicle Bone Region  Severe depletion  Clavicle and Acromion Bone Region  Severe depletion  Scapular Bone Region  Severe depletion  Dorsal Hand  Severe depletion  Patellar Region  Severe depletion  Anterior Thigh Region  Severe depletion  Posterior Calf Region  Severe depletion  Edema (RD Assessment)  Mild  Hair  Reviewed  Eyes  Reviewed  Mouth  Reviewed  Skin  Reviewed  Nails  Reviewed     Diet Order:   Diet Order            Diet regular Room service appropriate? Yes; Fluid consistency: Thin  Diet effective now             EDUCATION NEEDS:   Education needs have been addressed  Skin:  Skin Assessment: Reviewed RN Assessment(ecchymosis, MASD)  Last BM:  3/23- TYPE 6  Height:   Ht Readings from Last 1 Encounters:  10/02/19 5' (1.524 m)    Weight:   Wt Readings from Last 1 Encounters:  10/07/19 42.4 kg    Ideal Body Weight:  45.4 kg  BMI:  Body mass index is 18.26 kg/m.  Estimated Nutritional Needs:   Kcal:  1200-1400kcal/day  Protein:  60-70g/day  Fluid:  >1.1L/day  Koleen Distance MS, RD, LDN Contact information available in Amion

## 2019-10-08 ENCOUNTER — Inpatient Hospital Stay: Payer: Medicare HMO

## 2019-10-08 LAB — BASIC METABOLIC PANEL
Anion gap: 10 (ref 5–15)
BUN: 22 mg/dL (ref 8–23)
CO2: 23 mmol/L (ref 22–32)
Calcium: 8.3 mg/dL — ABNORMAL LOW (ref 8.9–10.3)
Chloride: 104 mmol/L (ref 98–111)
Creatinine, Ser: 1.21 mg/dL — ABNORMAL HIGH (ref 0.44–1.00)
GFR calc Af Amer: 52 mL/min — ABNORMAL LOW (ref >60)
GFR calc non Af Amer: 45 mL/min — ABNORMAL LOW (ref >60)
Glucose, Bld: 97 mg/dL (ref 70–99)
Potassium: 4.6 mmol/L (ref 3.5–5.1)
Sodium: 137 mmol/L (ref 135–145)

## 2019-10-08 LAB — URINALYSIS, ROUTINE W REFLEX MICROSCOPIC
Bilirubin Urine: NEGATIVE
Glucose, UA: NEGATIVE mg/dL
Hgb urine dipstick: NEGATIVE
Ketones, ur: NEGATIVE mg/dL
Leukocytes,Ua: NEGATIVE
Nitrite: NEGATIVE
Protein, ur: NEGATIVE mg/dL
Specific Gravity, Urine: 1.008 (ref 1.005–1.030)
pH: 7 (ref 5.0–8.0)

## 2019-10-08 LAB — CBC
HCT: 33.7 % — ABNORMAL LOW (ref 36.0–46.0)
Hemoglobin: 10.5 g/dL — ABNORMAL LOW (ref 12.0–15.0)
MCH: 31.6 pg (ref 26.0–34.0)
MCHC: 31.2 g/dL (ref 30.0–36.0)
MCV: 101.5 fL — ABNORMAL HIGH (ref 80.0–100.0)
Platelets: 259 10*3/uL (ref 150–400)
RBC: 3.32 MIL/uL — ABNORMAL LOW (ref 3.87–5.11)
RDW: 15.1 % (ref 11.5–15.5)
WBC: 7.3 10*3/uL (ref 4.0–10.5)
nRBC: 0 % (ref 0.0–0.2)

## 2019-10-08 NOTE — Progress Notes (Signed)
Occupational Therapy Treatment Patient Details Name: Tina Salinas MRN: [id number] DOB: Apr 19, 1946 Today's Date: 10/08/2019    History of present illness Tina Salinas is a 74 y.o. 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 fib on amiodarone, not on anticoagulation due to fall risk, RA on methotrexate and chronic prednisone who presents to the emergency room with a 3-day history of feeling unwell with cough congestion and shortness of breath, all of which acutely worsened after she got her Covid shot on the day of arrival.   OT comments  Tina Salinas was seen for OT/PT co-treatment on this date. Upon arrival to room pt semi-supine in bed, appears to be resting comfortably. Pt greets therapists upon entry and agreeable to co-tx saying "let's get it all over with now". Pt remains pleasantly confused throughout session and requires increased time/multimodal cueing to perform therapy tasks. She is oriented to self and limited date (can state month/year), but is not consistently oriented to place.  OT/PT engage pt in fxl mobility attempts. Pt requires +2 mod/max assist for all bed mobility and STS attempts this date. She also requires variable assist to maintain seated balance for functional ADL tasks at EOB this date. OT engages pt in ADLs as described below, while PT provides +2 assist for pt to maintain sitting balance at EOB. See ADL section below for additional details. Pt progressing toward goals and continues to benefit from skilled OT services to maximize return to PLOF and minimize risk of future falls, injury, caregiver burden, and readmission. Will continue to follow POC. Discharge recommendation remains appropriate.    Follow Up Recommendations  SNF    Equipment Recommendations  3 in 1 bedside commode    Recommendations for Other Services      Precautions /  Restrictions Precautions Precautions: Fall Restrictions Weight Bearing Restrictions: No       Mobility Bed Mobility Overal bed mobility: Needs Assistance Bed Mobility: Supine to Sit     Supine to sit: Max assist;+2 for physical assistance;Mod assist Sit to supine: Max assist;+2 for physical assistance   General bed mobility comments: assist for trunk elevation and to bring BLE over edge of bed this date.  Transfers Overall transfer level: Needs assistance Equipment used: Rolling walker (2 wheeled) Transfers: Sit to/from Stand Sit to Stand: Max assist;Mod assist;+2 physical assistance;+2 safety/equipment         General transfer comment: x3 trials STS from EOB with RW and +2 assist this date. Pt is unable to come to full stand given MOD/MAX assist +2. She is noted to have significant posterior lean and states "I have to sit" with each standing attempt.    Balance Overall balance assessment: Needs assistance Sitting-balance support: Bilateral upper extremity supported;Feet supported Sitting balance-Leahy Scale: Poor Sitting balance - Comments: pt tending to lean posterior or left/anterior requiring variable level of assist to maintain seated balance during functional tasks. She tends to lean onto her LUE when sitting w/o assist from therapists. Postural control: Posterior lean;Left lateral lean(anterior lean) Standing balance support: Bilateral upper extremity supported;During functional activity Standing balance-Leahy Scale: Zero                             ADL either performed or assessed with clinical judgement   ADL Overall ADL's : Needs assistance/impaired  General ADL Comments: Pt continues to require total assist to don/doff B shoes. Max assist to don LUE orthosis (pt brings from home). Pt requires variable assist +2 to sit EOB and perform hair washing using shampoo cap this date. Pt at times with  significant posterior and/or L lateral lean requiring MAX A to close CGA for sitting balance as well as MOD A to don/doff shampoo cap and perform hair washing this date. Pt is able to comb hair before/after using shampoo cap with moderate assist to reach hair at the back of her head. MOD/MAX assist +2 to perform bed mobility. Unable to attempt fxl t/f this date. Pt unable to come to full stand with +2 MOD/MAX A and use of RW.     Vision Baseline Vision/History: Wears glasses Wears Glasses: Reading only Patient Visual Report: No change from baseline     Perception     Praxis      Cognition Arousal/Alertness: Awake/alert Behavior During Therapy: WFL for tasks assessed/performed Overall Cognitive Status: Within Functional Limits for tasks assessed                                 General Comments: Pt orientation seems to vary during session. She remains generally confused but amenable throughout. At times she asks therapists in room if they "planned to visit her at home today?" but at other times is able to state she is in the hospital. She is able to perform all session activities with increased time/multimodal cueing throughout session.        Exercises Other Exercises Other Exercises: OT engages pt in seated ADL tasks including hair combing/hair washing this date. See ADL section for additional details. OT/PT engage pt in fxl mobility attempts. Pt unable to come to full stand this date despite +2 MOD/MAX assist from therapists and use of 2WW.   Shoulder Instructions       General Comments spO2 monitored t/o session and remains WFLs >92% with pt on 2L .    Pertinent Vitals/ Pain       Pain Assessment: No/denies pain Pain Intervention(s): Monitored during session  Home Living                                          Prior Functioning/Environment              Frequency  Min 1X/week        Progress Toward Goals  OT Goals(current goals  can now be found in the care plan section)  Progress towards OT goals: Progressing toward goals  Acute Rehab OT Goals Patient Stated Goal: to go home OT Goal Formulation: With patient Time For Goal Achievement: 10/18/19 Potential to Achieve Goals: Fair  Plan      Co-evaluation    PT/OT/SLP Co-Evaluation/Treatment: Yes Reason for Co-Treatment: Necessary to address cognition/behavior during functional activity;For patient/therapist safety;To address functional/ADL transfers PT goals addressed during session: Mobility/safety with mobility;Balance;Strengthening/ROM OT goals addressed during session: ADL's and self-care;Proper use of Adaptive equipment and DME      AM-PAC OT "6 Clicks" Daily Activity     Outcome Measure   Help from another person eating meals?: A Little Help from another person taking care of personal grooming?: A Little Help from another person toileting, which includes using toliet, bedpan, or urinal?: A Lot Help from  another person bathing (including washing, rinsing, drying)?: A Lot Help from another person to put on and taking off regular upper body clothing?: A Lot Help from another person to put on and taking off regular lower body clothing?: A Lot 6 Click Score: 14    End of Session Equipment Utilized During Treatment: Rolling walker;Gait belt  OT Visit Diagnosis: Unsteadiness on feet (R26.81);Other abnormalities of gait and mobility (R26.89);Muscle weakness (generalized) (M62.81)   Activity Tolerance Patient tolerated treatment well   Patient Left in bed;with call bell/phone within reach;with bed alarm set   Nurse Communication          Time: [id number] OT Time Calculation (min): 36 min  Charges: OT General Charges $OT Visit: 1 Visit OT Treatments $Self Care/Home Management : 8-22 mins  Shara Blazing, M.S., OTR/L Ascom: [phone] 10/08/19, 2:51 PM

## 2019-10-08 NOTE — Progress Notes (Signed)
Physical Therapy Treatment Patient Details Name: Tina Salinas MRN: [id number] DOB: 12/22/1945 Today's Date: 10/08/2019    History of Present Illness Tina Salinas is a 74 y.o. 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 fib on amiodarone, not on anticoagulation due to fall risk, RA on methotrexate and chronic prednisone who presents to the emergency room with a 3-day history of feeling unwell with cough congestion and shortness of breath, all of which acutely worsened after she got her Covid shot on the day of arrival.    PT Comments    Pt resting in bed upon PT/OT arrival for therapy co-treat.  Pt appearing pleasantly confused during session and requiring increased time and cueing for participation in therapy activities.  Pt oriented to self and month/year but not consistent with place.  Mod to max assist x2 for bed mobility and x3 trials attempting to stand up to RW (unable to come to full stand with 2 assist, R knee blocked, and max cueing).  Sitting balance fluctuating between close SBA to max assist (posterior mostly but also L lateral and anterior lean noted).  Pt performed ADL tasks with OT in sitting while PT assisted pt with sitting balance.  Will continue to focus on strengthening and progressive functional mobility during hospitalization.     Follow Up Recommendations  SNF     Equipment Recommendations  Rolling walker with 5" wheels;3in1 (PT);Wheelchair (measurements PT);Wheelchair cushion (measurements PT)    Recommendations for Other Services       Precautions / Restrictions Precautions Precautions: Fall Restrictions Weight Bearing Restrictions: No    Mobility  Bed Mobility Overal bed mobility: Needs Assistance Bed Mobility: Supine to Sit     Supine to sit: Mod assist;Max assist;+2 for physical assistance;HOB elevated Sit to supine: Max  assist;+2 for physical assistance;HOB elevated   General bed mobility comments: assist for trunk and B LE's semi-supine to/from sitting edge of bed  Transfers Overall transfer level: Needs assistance Equipment used: Rolling walker (2 wheeled) Transfers: Sit to/from Stand Sit to Stand: Mod assist;Max assist;+2 physical assistance;+2 safety/equipment         General transfer comment: x3 trials sit to stand from edge of bed with RW and 2 assist; unable to come to full stand with mod to max assist x2 and R knee blocked.  Pt tending to have posterior lean and reporting needing to sit after each standing attempt  Ambulation/Gait             General Gait Details: unable to at this time   Stairs             Wheelchair Mobility    Modified Rankin (Stroke Patients Only)       Balance Overall balance assessment: Needs assistance Sitting-balance support: Bilateral upper extremity supported;Feet supported Sitting balance-Leahy Scale: Poor Sitting balance - Comments: pt with posterior lean and also left or anterior lean requiring fluctuating assist (close SBA to max assist) Postural control: Posterior lean;Left lateral lean;Other (comment)(anterior lean) Standing balance support: Bilateral upper extremity supported;During functional activity Standing balance-Leahy Scale: Zero Standing balance comment: unable to come to full upright stand with 2 assist and posterior lean noted                            Cognition Arousal/Alertness: Awake/alert Behavior During Therapy: WFL for tasks assessed/performed Overall Cognitive Status:  Within Functional Limits for tasks assessed                                 General Comments: Pt sometimes able to state she is in the hospital (but got to hospital after 4pm today) and other times appears to think she is at home; general confusion noted      Exercises General Exercises - Lower Extremity Long Arc Quad:  AROM;Right;AAROM;Left;Strengthening;10 reps;Seated(assist for sitting balance) Hip Flexion/Marching: AROM;Right;AAROM;Left;Strengthening;10 reps;Seated(assist for sitting balance)    General Comments General comments (skin integrity, edema, etc.): HR WFL and O2 on 2 L via nasal cannula >92% during sessions activities.  Nursing cleared pt for participation in physical therapy.  Pt agreeable to PT/OT session.      Pertinent Vitals/Pain Pain Assessment: No/denies pain Pain Location: pt's "tail-bone" in sitting Pain Descriptors / Indicators: Sore;Grimacing;Discomfort Pain Intervention(s): Monitored during session    Home Living                      Prior Function            PT Goals (current goals can now be found in the care plan section) Acute Rehab PT Goals Patient Stated Goal: to go home PT Goal Formulation: With patient Time For Goal Achievement: 10/20/19 Potential to Achieve Goals: Fair Progress towards PT goals: Progressing toward goals    Frequency    Min 2X/week      PT Plan Current plan remains appropriate    Co-evaluation PT/OT/SLP Co-Evaluation/Treatment: Yes Reason for Co-Treatment: Necessary to address cognition/behavior during functional activity;For patient/therapist safety;To address functional/ADL transfers PT goals addressed during session: Mobility/safety with mobility;Balance;Strengthening/ROM OT goals addressed during session: ADL's and self-care;Proper use of Adaptive equipment and DME      AM-PAC PT "6 Clicks" Mobility   Outcome Measure  Help needed turning from your back to your side while in a flat bed without using bedrails?: A Lot Help needed moving from lying on your back to sitting on the side of a flat bed without using bedrails?: A Lot Help needed moving to and from a bed to a chair (including a wheelchair)?: A Lot Help needed standing up from a chair using your arms (e.g., wheelchair or bedside chair)?: Total Help needed to  walk in hospital room?: Total Help needed climbing 3-5 steps with a railing? : Total 6 Click Score: 9    End of Session Equipment Utilized During Treatment: Gait belt Activity Tolerance: Patient limited by fatigue Patient left: in bed;with call bell/phone within reach;with bed alarm set;Other (comment)(pt refused to have her shoes taken off in bed--nurse notified) Nurse Communication: Mobility status;Precautions PT Visit Diagnosis: Muscle weakness (generalized) (M62.81);Difficulty in walking, not elsewhere classified (R26.2)     Time: [id number] PT Time Calculation (min) (ACUTE ONLY): 36 min  Charges:  $Therapeutic Activity: 8-22 mins                     Leitha Bleak, PT 10/08/19, 5:41 PM

## 2019-10-08 NOTE — Progress Notes (Signed)
PROGRESS NOTE    Tina Salinas  [id number] DOB: 30-Aug-1945 DOA: 10/02/2019 PCP: Birdie Sons, MD      Assessment & Plan:   Principal Problem:   Acute on chronic combined systolic and diastolic CHF (congestive heart failure) ([place]) Active Problems:   Essential (primary) hypertension   Rheumatoid arthritis with rheumatoid factor (HCC)   Elevated troponin   AF (paroxysmal atrial fibrillation) (HCC)   Chronic kidney disease (CKD), active medical management without dialysis, stage 3 (moderate)   Acute respiratory failure with hypoxia (HCC)   CAD (coronary artery disease)   Non-ischemic cardiomyopathy (HCC)   CHF (congestive heart failure) (HCC)   Elevated liver enzymes   CAP (community acquired pneumonia)   Hypokalemia   Protein-calorie malnutrition, severe  Acute encephalopathy: etiology unclear, possibly metabolic vs infection. Pt is refusing all medications & refusing to be examined. CT brain ordered  Acute respiratory hypoxic failure due to community-acquired multifocal pneumonia: COVID-19 negative, Procalcitonin was 0.25. CT chest shows multifocal pneumonia. Continue on augmentin. Continue on supplemental oxygen and wean as tolerated. Encourage incentive spirometry. Continue w/ mucinex, robitussin   Acute on chronic combined CHF: w/ non-ischemic cardiomyopathy. Last EF on was 20 to 25%. Elevated troponin suspect mostly related to demand ischemia from heart failure exacerbation. Continue lasix. Monitor I/Os. Continue on coreg, amiodarone   Hypokalemia: WNL. Will continue to monitor   Essential HTN: continue coreg  Rheumatoid arthritis: continue methotrexate and prednisone  PAF: continue amiodarone, coreg. Not currently on systemic anticoagulation due to fall risk. Pt's cardiologist is Dr. Ubaldo Glassing  CKD stage IIIa: at baseline. Will continue to monitor  Transaminitis: possibly related to hepatic congestion from heart failure exacerbation  Elevated TSH: 5.2, slightly  elevated free T4 1.57. Recommend to repeat TSH level after 4 to 6 weeks and follow with PCP.  Vitamin D deficiency: continue on vitamin D 50,000 units weekly  Vitamin B12 deficiency: continue vitamin B12 supplements. Macrocytosis, folate WNL  Severe protein calorie malnutrition: Encourage use of dietary supplements. Follow-up with dietitian. BMI 17.6   DVT prophylaxis: lovenox Code Status: Full  Family Communication: discussed pt's care w/ pt's friend, [name], answered her questions Disposition Plan: needs to go to SNF but pt was refusing. Will discuss home health    Consultants:      Procedures:    Antimicrobials:  augmentin   Subjective: Pt is very agitated, confused & frustrated. Pt is refusing to answer questions and refusing to take any meds  Objective: Vitals:   10/07/19 1956 10/08/19 0545 10/08/19 0830 10/08/19 1131  BP: 115/73 112/67 118/62 (!) 101/58  Pulse: 82 89 88 81  Resp: 19 19 16 16   Temp: 98.5 F (36.9 C) 98.5 F (36.9 C) 100.2 F (37.9 C) (!) 100.8 F (38.2 C)  TempSrc: Oral Oral Oral Oral  SpO2: 98% 94% 94% 95%  Weight:  41.1 kg    Height:        Intake/Output Summary (Last 24 hours) at 10/08/2019 1206 Last data filed at 10/08/2019 0545 Gross per 24 hour  Intake --  Output 1050 ml  Net -1050 ml   Filed Weights   10/06/19 0202 10/07/19 0500 10/08/19 0545  Weight: 41 kg 42.4 kg 41.1 kg    Examination:  Pt is refusing physical exam      Data Reviewed: I have personally reviewed following labs and imaging studies  CBC: Recent Labs  Lab 10/02/19 0229 10/02/19 0229 10/03/19 0450 10/04/19 0506 10/05/19 0451 10/06/19 0430 10/07/19 0508  WBC 11.9*   < >  11.5* 7.9 8.1 7.0 8.6  NEUTROABS 10.1*  --   --   --   --   --   --   HGB 11.2*   < > 9.9* 9.6* 10.2* 9.8* 10.6*  HCT 36.7   < > 31.4* 32.3* 34.4* 32.8* 33.8*  MCV 104.0*   < > 102.3* 107.7* 106.2* 105.1* 103.0*  PLT 269   < > 215 213 222 237 249   < > = values in this interval  not displayed.   Basic Metabolic Panel: Recent Labs  Lab 10/03/19 0450 10/04/19 0506 10/05/19 0451 10/06/19 0430 10/07/19 0508  NA 140 143 143 140 142  K 2.9* 4.6 4.5 4.3 4.5  CL 104 110 104 106 108  CO2 27 30 26 27 25   GLUCOSE 121* 109* 95 90 108*  BUN 27* <5* 24* 22 22  CREATININE 1.18* 1.13* 1.10* 1.08* 1.09*  CALCIUM 7.7* 7.9* 8.4* 8.2* 8.3*  MG 2.0 2.3 2.2  --   --   PHOS 2.6 2.6 3.1  --   --    GFR: Estimated Creatinine Clearance: 30.3 mL/min (A) (by C-G formula based on SCr of 1.09 mg/dL (H)). Liver Function Tests: Recent Labs  Lab 10/02/19 0229  AST 45*  ALT 21  ALKPHOS 108  BILITOT 2.0*  PROT 6.7  ALBUMIN 3.2*   No results for input(s): LIPASE, AMYLASE in the last 168 hours. No results for input(s): AMMONIA in the last 168 hours. Coagulation Profile: No results for input(s): INR, PROTIME in the last 168 hours. Cardiac Enzymes: No results for input(s): CKTOTAL, CKMB, CKMBINDEX, TROPONINI in the last 168 hours. BNP (last 3 results) No results for input(s): PROBNP in the last 8760 hours. HbA1C: No results for input(s): HGBA1C in the last 72 hours. CBG: No results for input(s): GLUCAP in the last 168 hours. Lipid Profile: No results for input(s): CHOL, HDL, LDLCALC, TRIG, CHOLHDL, LDLDIRECT in the last 72 hours. Thyroid Function Tests: No results for input(s): TSH, T4TOTAL, FREET4, T3FREE, THYROIDAB in the last 72 hours. Anemia Panel: No results for input(s): VITAMINB12, FOLATE, FERRITIN, TIBC, IRON, RETICCTPCT in the last 72 hours. Sepsis Labs: Recent Labs  Lab 10/02/19 0229  PROCALCITON 0.25    Recent Results (from the past 240 hour(s))  Respiratory Panel by RT PCR (Flu A&B, Covid) - Nasopharyngeal Swab     Status: None   Collection Time: 10/02/19  4:22 AM   Specimen: Nasopharyngeal Swab  Result Value Ref Range Status   SARS Coronavirus 2 by RT PCR NEGATIVE NEGATIVE Final    Comment: (NOTE) SARS-CoV-2 target nucleic acids are NOT DETECTED. The  SARS-CoV-2 RNA is generally detectable in upper respiratoy specimens during the acute phase of infection. The lowest concentration of SARS-CoV-2 viral copies this assay can detect is 131 copies/mL. A negative result does not preclude SARS-Cov-2 infection and should not be used as the sole basis for treatment or other patient management decisions. A negative result may occur with  improper specimen collection/handling, submission of specimen other than nasopharyngeal swab, presence of viral mutation(s) within the areas targeted by this assay, and inadequate number of viral copies (<131 copies/mL). A negative result must be combined with clinical observations, patient history, and epidemiological information. The expected result is Negative. Fact Sheet for Patients:  PinkCheek.be Fact Sheet for Healthcare Providers:  GravelBags.it This test is not yet ap proved or cleared by the [place] FDA and  has been authorized for detection and/or diagnosis of SARS-CoV-2 by FDA under an  Emergency Use Authorization (EUA). This EUA will remain  in effect (meaning this test can be used) for the duration of the COVID-19 declaration under Section 564(b)(1) of the Act, 21 U.S.C. section 360bbb-3(b)(1), unless the authorization is terminated or revoked sooner.    Influenza A by PCR NEGATIVE NEGATIVE Final   Influenza B by PCR NEGATIVE NEGATIVE Final    Comment: (NOTE) The Xpert Xpress SARS-CoV-2/FLU/RSV assay is intended as an aid in  the diagnosis of influenza from Nasopharyngeal swab specimens and  should not be used as a sole basis for treatment. Nasal washings and  aspirates are unacceptable for Xpert Xpress SARS-CoV-2/FLU/RSV  testing. Fact Sheet for Patients: PinkCheek.be Fact Sheet for Healthcare Providers: GravelBags.it This test is not yet approved or cleared by the [place] FDA and  has been authorized for detection and/or diagnosis of SARS-CoV-2 by  FDA under an Emergency Use Authorization (EUA). This EUA will remain  in effect (meaning this test can be used) for the duration of the  Covid-19 declaration under Section 564(b)(1) of the Act, 21  U.S.C. section 360bbb-3(b)(1), unless the authorization is  terminated or revoked. Performed at [facility], [address] [place], [place] [address]   CULTURE, BLOOD (ROUTINE X 2) w Reflex to ID Panel     Status: None   Collection Time: 10/02/19  4:24 PM   Specimen: BLOOD  Result Value Ref Range Status   Specimen Description BLOOD BLOOD LEFT HAND  Final   Special Requests   Final    BOTTLES DRAWN AEROBIC AND ANAEROBIC Blood Culture adequate volume   Culture   Final    NO GROWTH 5 DAYS Performed at [facility], [address] [place], [place] [address]    Report Status 10/07/2019 FINAL  Final  CULTURE, BLOOD (ROUTINE X 2) w Reflex to ID Panel     Status: None   Collection Time: 10/02/19  4:24 PM   Specimen: BLOOD  Result Value Ref Range Status   Specimen Description BLOOD BLOOD LEFT WRIST  Final   Special Requests   Final    BOTTLES DRAWN AEROBIC AND ANAEROBIC Blood Culture adequate volume   Culture   Final    NO GROWTH 5 DAYS Performed at [facility], [address], [place], [place] [address]    Report Status 10/07/2019 FINAL  Final         Radiology Studies: No results found.      Scheduled Meds: . amiodarone  200 mg Oral Daily  . amoxicillin-clavulanate  1 tablet Oral Q12H  . vitamin C  500 mg Oral Daily  . aspirin EC  81 mg Oral Daily  . calcium carbonate  1,250 mg Oral Daily  . carvedilol  3.125 mg Oral BID  . cholecalciferol  1,000 Units Oral Daily  . collagenase  1 application Topical Daily  . cyanocobalamin  1,000 mcg Intramuscular Daily  . enoxaparin (LOVENOX) injection  40 mg Subcutaneous Q24H  . feeding supplement (ENSURE ENLIVE)  1  Bottle Oral TID BM  . folic acid  1 mg Oral Daily  . furosemide  20 mg Oral Daily  . guaiFENesin  600 mg Oral BID  . hydroxychloroquine  400 mg Oral Daily  . iron polysaccharides  150 mg Oral Daily  . latanoprost  1 drop Both Eyes QHS  . LORazepam  1 mg Intravenous Once  . mouth rinse  15 mL Mouth Rinse BID  . mirtazapine  30 mg Oral QHS  .  multivitamin with minerals  1 tablet Oral Daily  . pravastatin  40 mg Oral QHS  . predniSONE  2.5 mg Oral Q breakfast  . sodium chloride flush  3 mL Intravenous Q12H  . Vitamin D (Ergocalciferol)  50,000 Units Oral Q7 days   Continuous Infusions: . sodium chloride Stopped (10/04/19 2053)     LOS: 5 days    Time spent: 34 mins     Wyvonnia Dusky, MD [facility] Pager [phone]  If 7PM-7AM, please contact night-coverage [email or web address] 10/08/2019, 12:06 PM

## 2019-10-08 NOTE — Progress Notes (Addendum)
Pt is highly agitated, but alert and oriented x3 (disoriented to situation).  Pt is calling 911, her lawyer, a locksmith, etc to "get them out of my house".  (Them = hospital staff)  Pt is refusing all direct patient care and medications.  Pt refused writer to perform physical assessment.  Will contact MD regarding situation.  0914--Spoke with Dr. [name]. She will come to bedside to see pt.  Possible Head CT without contrast if pt permits.

## 2019-10-08 NOTE — Plan of Care (Signed)

## 2019-10-08 NOTE — Plan of Care (Signed)
Pt experiencing intermittent agitation, irritability, and uncooperative behavior.  Pt is calmer with the presence of her friend, [name].  CT head completed (negative for any acute changes).  Still waiting for urine for clean catch U/A.   Problem: Clinical Measurements: Goal: Ability to maintain clinical measurements within normal limits will improve Outcome: Progressing Goal: Will remain free from infection Outcome: Not Progressing Goal: Diagnostic test results will improve Outcome: Progressing Goal: Respiratory complications will improve Outcome: Not Progressing Goal: Cardiovascular complication will be avoided Outcome: Progressing

## 2019-10-09 LAB — CBC
HCT: 36.4 % (ref 36.0–46.0)
Hemoglobin: 10.9 g/dL — ABNORMAL LOW (ref 12.0–15.0)
MCH: 30.8 pg (ref 26.0–34.0)
MCHC: 29.9 g/dL — ABNORMAL LOW (ref 30.0–36.0)
MCV: 102.8 fL — ABNORMAL HIGH (ref 80.0–100.0)
Platelets: 261 10*3/uL (ref 150–400)
RBC: 3.54 MIL/uL — ABNORMAL LOW (ref 3.87–5.11)
RDW: 14.8 % (ref 11.5–15.5)
WBC: 7.2 10*3/uL (ref 4.0–10.5)
nRBC: 0 % (ref 0.0–0.2)

## 2019-10-09 LAB — BASIC METABOLIC PANEL
Anion gap: 11 (ref 5–15)
BUN: 27 mg/dL — ABNORMAL HIGH (ref 8–23)
CO2: 25 mmol/L (ref 22–32)
Calcium: 8.3 mg/dL — ABNORMAL LOW (ref 8.9–10.3)
Chloride: 104 mmol/L (ref 98–111)
Creatinine, Ser: 1.27 mg/dL — ABNORMAL HIGH (ref 0.44–1.00)
GFR calc Af Amer: 49 mL/min — ABNORMAL LOW (ref >60)
GFR calc non Af Amer: 42 mL/min — ABNORMAL LOW (ref >60)
Glucose, Bld: 93 mg/dL (ref 70–99)
Potassium: 4.3 mmol/L (ref 3.5–5.1)
Sodium: 140 mmol/L (ref 135–145)

## 2019-10-09 LAB — URINE CULTURE: Culture: 40000 — AB

## 2019-10-09 NOTE — Care Management Important Message (Signed)
Important Message  Patient Details  Name: Tina Salinas MRN: [id number] Date of Birth: [birth year]   Medicare Important Message Given:  Yes     [name] 10/09/2019, 1:15 PM

## 2019-10-09 NOTE — Progress Notes (Signed)
PROGRESS NOTE    Tina Salinas  [id number] DOB: 19-Jun-1946 DOA: 10/02/2019 PCP: Birdie Sons, MD      Assessment & Plan:   Principal Problem:   Acute on chronic combined systolic and diastolic CHF (congestive heart failure) ([place]) Active Problems:   Essential (primary) hypertension   Rheumatoid arthritis with rheumatoid factor (HCC)   Elevated troponin   AF (paroxysmal atrial fibrillation) (HCC)   Chronic kidney disease (CKD), active medical management without dialysis, stage 3 (moderate)   Acute respiratory failure with hypoxia (HCC)   CAD (coronary artery disease)   Non-ischemic cardiomyopathy (HCC)   CHF (congestive heart failure) (HCC)   Elevated liver enzymes   CAP (community acquired pneumonia)   Hypokalemia   Protein-calorie malnutrition, severe  Acute encephalopathy: etiology unclear, possibly metabolic vs infection. CT brain showed no acute intracranial abnormalities. Blood cxs NGTD. Urine cx pending   Acute respiratory hypoxic failure due to community-acquired multifocal pneumonia: COVID-19 negative, Procalcitonin was 0.25. CT chest shows multifocal pneumonia. Continue on augmentin. Continue on supplemental oxygen and wean as tolerated. Encourage incentive spirometry. Continue w/ mucinex, robitussin   Acute on chronic combined CHF: w/ non-ischemic cardiomyopathy. Last EF on was 20 to 25%. Elevated troponin suspect mostly related to demand ischemia from heart failure exacerbation. Continue lasix. Monitor I/Os. Continue on coreg, amiodarone   Hypokalemia: WNL. Will continue to monitor   Essential HTN: continue coreg  Rheumatoid arthritis: continue methotrexate and prednisone  PAF: continue amiodarone, coreg. Not currently on systemic anticoagulation due to fall risk. Pt's cardiologist is Dr. Ubaldo Glassing  CKD stage IIIa: at baseline. Will continue to monitor  Transaminitis: possibly related to hepatic congestion from heart failure exacerbation  Elevated TSH:  5.2, slightly elevated free T4 1.57. Recommend to repeat TSH level after 4 to 6 weeks and follow with PCP.  Vitamin D deficiency: continue on vitamin D 50,000 units weekly  Vitamin B12 deficiency: continue vitamin B12 supplements. Macrocytosis, folate WNL  Severe protein calorie malnutrition: Encourage use of dietary supplements. Follow-up with dietitian. BMI 17.6   DVT prophylaxis: lovenox Code Status: Full  Family Communication:  Disposition Plan: needs to go to SNF but depends on if pt agrees to go or not    Consultants:      Procedures:    Antimicrobials:  augmentin   Subjective: Pt c/o fatigue  Objective: Vitals:   10/08/19 1646 10/08/19 1939 10/09/19 0414 10/09/19 0759  BP: 106/85 (!) 87/60 122/70 111/61  Pulse: 71 70 87 92  Resp: 18   16  Temp: 97.7 F (36.5 C) 98.4 F (36.9 C) 99.7 F (37.6 C) 99.1 F (37.3 C)  TempSrc: Oral Oral Oral Oral  SpO2: 94% 95% 98% 97%  Weight:   41.9 kg   Height:        Intake/Output Summary (Last 24 hours) at 10/09/2019 0804 Last data filed at 10/09/2019 0523 Gross per 24 hour  Intake 60 ml  Output 0 ml  Net 60 ml   Filed Weights   10/07/19 0500 10/08/19 0545 10/09/19 0414  Weight: 42.4 kg 41.1 kg 41.9 kg    Examination:  General exam: Appears calm and comfortable  Respiratory system: decreased breath sounds b/l. No wheezes  Cardiovascular system: S1, S2+. No rubs, gallops or clicks.  Gastrointestinal system: Abdomen is nondistended, soft and nontender. Normal bowel sounds heard. Central nervous system: Lethargic. Moves all 4 extremities  Psychiatry: Judgement and insight appear abnormal. Flat mood and affect   Data Reviewed: I have personally reviewed following  labs and imaging studies  CBC: Recent Labs  Lab 10/05/19 0451 10/06/19 0430 10/07/19 0508 10/08/19 1217 10/09/19 0424  WBC 8.1 7.0 8.6 7.3 7.2  HGB 10.2* 9.8* 10.6* 10.5* 10.9*  HCT 34.4* 32.8* 33.8* 33.7* 36.4  MCV 106.2* 105.1* 103.0* 101.5*  102.8*  PLT 222 237 249 259 0000000   Basic Metabolic Panel: Recent Labs  Lab 10/03/19 0450 10/03/19 0450 10/04/19 0506 10/04/19 0506 10/05/19 0451 10/06/19 0430 10/07/19 0508 10/08/19 1217 10/09/19 0424  NA 140   < > 143   < > 143 140 142 137 140  K 2.9*   < > 4.6   < > 4.5 4.3 4.5 4.6 4.3  CL 104   < > 110   < > 104 106 108 104 104  CO2 27   < > 30   < > 26 27 25 23 25   GLUCOSE 121*   < > 109*   < > 95 90 108* 97 93  BUN 27*   < > <5*   < > 24* 22 22 22  27*  CREATININE 1.18*   < > 1.13*   < > 1.10* 1.08* 1.09* 1.21* 1.27*  CALCIUM 7.7*   < > 7.9*   < > 8.4* 8.2* 8.3* 8.3* 8.3*  MG 2.0  --  2.3  --  2.2  --   --   --   --   PHOS 2.6  --  2.6  --  3.1  --   --   --   --    < > = values in this interval not displayed.   GFR: Estimated Creatinine Clearance: 26.5 mL/min (A) (by C-G formula based on SCr of 1.27 mg/dL (H)). Liver Function Tests: No results for input(s): AST, ALT, ALKPHOS, BILITOT, PROT, ALBUMIN in the last 168 hours. No results for input(s): LIPASE, AMYLASE in the last 168 hours. No results for input(s): AMMONIA in the last 168 hours. Coagulation Profile: No results for input(s): INR, PROTIME in the last 168 hours. Cardiac Enzymes: No results for input(s): CKTOTAL, CKMB, CKMBINDEX, TROPONINI in the last 168 hours. BNP (last 3 results) No results for input(s): PROBNP in the last 8760 hours. HbA1C: No results for input(s): HGBA1C in the last 72 hours. CBG: No results for input(s): GLUCAP in the last 168 hours. Lipid Profile: No results for input(s): CHOL, HDL, LDLCALC, TRIG, CHOLHDL, LDLDIRECT in the last 72 hours. Thyroid Function Tests: No results for input(s): TSH, T4TOTAL, FREET4, T3FREE, THYROIDAB in the last 72 hours. Anemia Panel: No results for input(s): VITAMINB12, FOLATE, FERRITIN, TIBC, IRON, RETICCTPCT in the last 72 hours. Sepsis Labs: No results for input(s): PROCALCITON, LATICACIDVEN in the last 168 hours.  Recent Results (from the past 240  hour(s))  Respiratory Panel by RT PCR (Flu A&B, Covid) - Nasopharyngeal Swab     Status: None   Collection Time: 10/02/19  4:22 AM   Specimen: Nasopharyngeal Swab  Result Value Ref Range Status   SARS Coronavirus 2 by RT PCR NEGATIVE NEGATIVE Final    Comment: (NOTE) SARS-CoV-2 target nucleic acids are NOT DETECTED. The SARS-CoV-2 RNA is generally detectable in upper respiratoy specimens during the acute phase of infection. The lowest concentration of SARS-CoV-2 viral copies this assay can detect is 131 copies/mL. A negative result does not preclude SARS-Cov-2 infection and should not be used as the sole basis for treatment or other patient management decisions. A negative result may occur with  improper specimen collection/handling, submission of specimen other than  nasopharyngeal swab, presence of viral mutation(s) within the areas targeted by this assay, and inadequate number of viral copies (<131 copies/mL). A negative result must be combined with clinical observations, patient history, and epidemiological information. The expected result is Negative. Fact Sheet for Patients:  PinkCheek.be Fact Sheet for Healthcare Providers:  GravelBags.it This test is not yet ap proved or cleared by the [place] FDA and  has been authorized for detection and/or diagnosis of SARS-CoV-2 by FDA under an Emergency Use Authorization (EUA). This EUA will remain  in effect (meaning this test can be used) for the duration of the COVID-19 declaration under Section 564(b)(1) of the Act, 21 U.S.C. section 360bbb-3(b)(1), unless the authorization is terminated or revoked sooner.    Influenza A by PCR NEGATIVE NEGATIVE Final   Influenza B by PCR NEGATIVE NEGATIVE Final    Comment: (NOTE) The Xpert Xpress SARS-CoV-2/FLU/RSV assay is intended as an aid in  the diagnosis of influenza from Nasopharyngeal swab specimens and  should not be used as  a sole basis for treatment. Nasal washings and  aspirates are unacceptable for Xpert Xpress SARS-CoV-2/FLU/RSV  testing. Fact Sheet for Patients: PinkCheek.be Fact Sheet for Healthcare Providers: GravelBags.it This test is not yet approved or cleared by the [place] FDA and  has been authorized for detection and/or diagnosis of SARS-CoV-2 by  FDA under an Emergency Use Authorization (EUA). This EUA will remain  in effect (meaning this test can be used) for the duration of the  Covid-19 declaration under Section 564(b)(1) of the Act, 21  U.S.C. section 360bbb-3(b)(1), unless the authorization is  terminated or revoked. Performed at [facility], [address] [place], [place] [address]   CULTURE, BLOOD (ROUTINE X 2) w Reflex to ID Panel     Status: None   Collection Time: 10/02/19  4:24 PM   Specimen: BLOOD  Result Value Ref Range Status   Specimen Description BLOOD BLOOD LEFT HAND  Final   Special Requests   Final    BOTTLES DRAWN AEROBIC AND ANAEROBIC Blood Culture adequate volume   Culture   Final    NO GROWTH 5 DAYS Performed at [facility], [address] [place], [place] [address]    Report Status 10/07/2019 FINAL  Final  CULTURE, BLOOD (ROUTINE X 2) w Reflex to ID Panel     Status: None   Collection Time: 10/02/19  4:24 PM   Specimen: BLOOD  Result Value Ref Range Status   Specimen Description BLOOD BLOOD LEFT WRIST  Final   Special Requests   Final    BOTTLES DRAWN AEROBIC AND ANAEROBIC Blood Culture adequate volume   Culture   Final    NO GROWTH 5 DAYS Performed at [facility], [address] [place], [place] [address]    Report Status 10/07/2019 FINAL  Final         Radiology Studies: CT HEAD WO CONTRAST  Result Date: 10/08/2019 CLINICAL DATA:  Encephalopathy. Agitation. EXAM: CT HEAD WITHOUT CONTRAST TECHNIQUE: Contiguous axial images were obtained from the  base of the skull through the vertex without intravenous contrast. COMPARISON:  None. FINDINGS: Brain: No intracranial hemorrhage, mass effect, or midline shift. Generalized atrophy, normal for age. No hydrocephalus. The basilar cisterns are patent. Moderate to advanced periventricular white matter hypodensity, nonspecific but typically chronic small vessel ischemia. No evidence of territorial infarct or acute ischemia. No extra-axial or intracranial fluid collection. Vascular: Atherosclerosis of skullbase vasculature without hyperdense vessel or abnormal calcification. Skull: No  fracture or focal lesion. Sinuses/Orbits: Mucosal thickening throughout left maxillary sinus. No sinus fluid level. Mastoid air cells are hypoplastic. Orbits are unremarkable. Other: None. IMPRESSION: 1. No acute intracranial abnormality. 2. Age related atrophy. Moderate to advanced periventricular white matter hypodensity, nonspecific but typically chronic small vessel ischemia. Electronically Signed   By: Keith Rake M.D.   On: 10/08/2019 12:38        Scheduled Meds: . amiodarone  200 mg Oral Daily  . amoxicillin-clavulanate  1 tablet Oral Q12H  . vitamin C  500 mg Oral Daily  . aspirin EC  81 mg Oral Daily  . calcium carbonate  1,250 mg Oral Daily  . carvedilol  3.125 mg Oral BID  . cholecalciferol  1,000 Units Oral Daily  . collagenase  1 application Topical Daily  . cyanocobalamin  1,000 mcg Intramuscular Daily  . enoxaparin (LOVENOX) injection  40 mg Subcutaneous Q24H  . feeding supplement (ENSURE ENLIVE)  1 Bottle Oral TID BM  . folic acid  1 mg Oral Daily  . furosemide  20 mg Oral Daily  . guaiFENesin  600 mg Oral BID  . hydroxychloroquine  400 mg Oral Daily  . iron polysaccharides  150 mg Oral Daily  . latanoprost  1 drop Both Eyes QHS  . LORazepam  1 mg Intravenous Once  . mouth rinse  15 mL Mouth Rinse BID  . mirtazapine  30 mg Oral QHS  . multivitamin with minerals  1 tablet Oral Daily  .  pravastatin  40 mg Oral QHS  . predniSONE  2.5 mg Oral Q breakfast  . sodium chloride flush  3 mL Intravenous Q12H  . Vitamin D (Ergocalciferol)  50,000 Units Oral Q7 days   Continuous Infusions: . sodium chloride Stopped (10/04/19 2053)     LOS: 6 days    Time spent: 30 mins     Wyvonnia Dusky, MD [facility] Pager [phone]  If 7PM-7AM, please contact night-coverage [email or web address] 10/09/2019, 8:04 AM

## 2019-10-10 ENCOUNTER — Ambulatory Visit: Payer: Medicare HMO | Admitting: Family

## 2019-10-10 LAB — CBC
HCT: 34.6 % — ABNORMAL LOW (ref 36.0–46.0)
Hemoglobin: 10.3 g/dL — ABNORMAL LOW (ref 12.0–15.0)
MCH: 31 pg (ref 26.0–34.0)
MCHC: 29.8 g/dL — ABNORMAL LOW (ref 30.0–36.0)
MCV: 104.2 fL — ABNORMAL HIGH (ref 80.0–100.0)
Platelets: 252 10*3/uL (ref 150–400)
RBC: 3.32 MIL/uL — ABNORMAL LOW (ref 3.87–5.11)
RDW: 15 % (ref 11.5–15.5)
WBC: 5.8 10*3/uL (ref 4.0–10.5)
nRBC: 0 % (ref 0.0–0.2)

## 2019-10-10 LAB — COMPREHENSIVE METABOLIC PANEL
ALT: 19 U/L (ref 0–44)
AST: 35 U/L (ref 15–41)
Albumin: 2.5 g/dL — ABNORMAL LOW (ref 3.5–5.0)
Alkaline Phosphatase: 57 U/L (ref 38–126)
Anion gap: 11 (ref 5–15)
BUN: 29 mg/dL — ABNORMAL HIGH (ref 8–23)
CO2: 24 mmol/L (ref 22–32)
Calcium: 8.1 mg/dL — ABNORMAL LOW (ref 8.9–10.3)
Chloride: 105 mmol/L (ref 98–111)
Creatinine, Ser: 1.25 mg/dL — ABNORMAL HIGH (ref 0.44–1.00)
GFR calc Af Amer: 50 mL/min — ABNORMAL LOW (ref >60)
GFR calc non Af Amer: 43 mL/min — ABNORMAL LOW (ref >60)
Glucose, Bld: 88 mg/dL (ref 70–99)
Potassium: 3.9 mmol/L (ref 3.5–5.1)
Sodium: 140 mmol/L (ref 135–145)
Total Bilirubin: 0.5 mg/dL (ref 0.3–1.2)
Total Protein: 6 g/dL — ABNORMAL LOW (ref 6.5–8.1)

## 2019-10-10 MED ORDER — AMOXICILLIN-POT CLAVULANATE 500-125 MG PO TABS
1.0000 | ORAL_TABLET | Freq: Two times a day (BID) | ORAL | Status: AC
  Administered 2019-10-10 (×2): 500 mg via ORAL
  Filled 2019-10-10 (×2): qty 1

## 2019-10-10 MED ORDER — CARVEDILOL 3.125 MG PO TABS
3.1250 mg | ORAL_TABLET | Freq: Every day | ORAL | Status: DC
  Filled 2019-10-10: qty 1

## 2019-10-10 MED ORDER — ENOXAPARIN SODIUM 30 MG/0.3ML ~~LOC~~ SOLN
30.0000 mg | SUBCUTANEOUS | Status: DC
  Administered 2019-10-10 – 2019-10-11 (×2): 30 mg via SUBCUTANEOUS
  Filled 2019-10-10 (×2): qty 0.3

## 2019-10-10 NOTE — Progress Notes (Signed)
PROGRESS NOTE    Tina Salinas  [id number] DOB: 1945/08/28 DOA: 10/02/2019 PCP: Birdie Sons, MD      Assessment & Plan:   Principal Problem:   Acute on chronic combined systolic and diastolic CHF (congestive heart failure) ([place]) Active Problems:   Essential (primary) hypertension   Rheumatoid arthritis with rheumatoid factor (HCC)   Elevated troponin   AF (paroxysmal atrial fibrillation) (HCC)   Chronic kidney disease (CKD), active medical management without dialysis, stage 3 (moderate)   Acute respiratory failure with hypoxia (HCC)   CAD (coronary artery disease)   Non-ischemic cardiomyopathy (HCC)   CHF (congestive heart failure) (HCC)   Elevated liver enzymes   CAP (community acquired pneumonia)   Hypokalemia   Protein-calorie malnutrition, severe  Acute encephalopathy: etiology unclear, possibly metabolic vs infection. CT brain showed no acute intracranial abnormalities. Blood cxs NGTD. Urine cx growing viridans strept  UTI: urine cx is growing viridans strept. Continue on augmentin   Acute respiratory hypoxic failure due to community-acquired multifocal pneumonia: COVID-19 negative, Procalcitonin was 0.25. CT chest shows multifocal pneumonia. Continue on augmentin to complete a 7 day course. Continue on supplemental oxygen and wean as tolerated. Encourage incentive spirometry. Continue w/ mucinex, robitussin   Acute on chronic combined CHF: w/ non-ischemic cardiomyopathy. Last EF on was 20 to 25%. Elevated troponin suspect mostly related to demand ischemia from heart failure exacerbation. Continue lasix. Monitor I/Os. Continue on coreg, amiodarone   Hypokalemia: WNL. Will continue to monitor   Essential HTN: continue coreg  Rheumatoid arthritis: continue methotrexate and prednisone  PAF: continue amiodarone, coreg. Not currently on systemic anticoagulation due to fall risk. Pt's cardiologist is Dr. Ubaldo Glassing  CKD stage IIIa: at baseline. Will continue to  monitor  Transaminitis: resolved  Elevated TSH: 5.2, slightly elevated free T4 1.57. Recommend to repeat TSH level after 4 to 6 weeks and follow with PCP.  Vitamin D deficiency: continue on vitamin D 50,000 units weekly  Vitamin B12 deficiency: continue vitamin B12 supplements. Macrocytosis, folate WNL  Severe protein calorie malnutrition: Encourage use of dietary supplements. Follow-up with dietitian. BMI 17.6   DVT prophylaxis: lovenox Code Status: Full  Family Communication:  Disposition Plan: will be d/c to SNF tomorrow     Consultants:      Procedures:    Antimicrobials:  augmentin   Subjective: Pt c/o difficulty feeding herself  Objective: Vitals:   10/09/19 1227 10/09/19 1632 10/09/19 1943 10/10/19 0600  BP: (!) 93/52 (!) 114/52 (!) 100/50   Pulse: 76 73 75   Resp: 16 16    Temp: 98.7 F (37.1 C) 98.6 F (37 C) 98.4 F (36.9 C)   TempSrc: Oral Oral Oral   SpO2: 97% 93% 95%   Weight:    38 kg  Height:        Intake/Output Summary (Last 24 hours) at 10/10/2019 0741 Last data filed at 10/10/2019 0121 Gross per 24 hour  Intake 480 ml  Output 1375 ml  Net -895 ml   Filed Weights   10/08/19 0545 10/09/19 0414 10/10/19 0600  Weight: 41.1 kg 41.9 kg 38 kg    Examination:  General exam: Appears calm and comfortable  Respiratory system: diminished breath sounds b/l. No rales Cardiovascular system: S1, S2+. No rubs, gallops or clicks.  Gastrointestinal system: Abdomen is nondistended, soft and nontender. Normal bowel sounds heard. Central nervous system:  Moves all 4 extremities  Psychiatry: Judgement and insight appear abnormal. Agitated and frustrated    Data Reviewed: I have  personally reviewed following labs and imaging studies  CBC: Recent Labs  Lab 10/06/19 0430 10/07/19 0508 10/08/19 1217 10/09/19 0424 10/10/19 0459  WBC 7.0 8.6 7.3 7.2 5.8  HGB 9.8* 10.6* 10.5* 10.9* 10.3*  HCT 32.8* 33.8* 33.7* 36.4 34.6*  MCV 105.1* 103.0*  101.5* 102.8* 104.2*  PLT 237 249 259 261 [id number]   Basic Metabolic Panel: Recent Labs  Lab 10/04/19 0506 10/04/19 0506 10/05/19 0451 10/05/19 0451 10/06/19 0430 10/07/19 0508 10/08/19 1217 10/09/19 0424 10/10/19 0459  NA 143   < > 143   < > 140 142 137 140 140  K 4.6   < > 4.5   < > 4.3 4.5 4.6 4.3 3.9  CL 110   < > 104   < > 106 108 104 104 105  CO2 30   < > 26   < > 27 25 23 25 24   GLUCOSE 109*   < > 95   < > 90 108* 97 93 88  BUN <5*   < > 24*   < > 22 22 22  27* 29*  CREATININE 1.13*   < > 1.10*   < > 1.08* 1.09* 1.21* 1.27* 1.25*  CALCIUM 7.9*   < > 8.4*   < > 8.2* 8.3* 8.3* 8.3* 8.1*  MG 2.3  --  2.2  --   --   --   --   --   --   PHOS 2.6  --  3.1  --   --   --   --   --   --    < > = values in this interval not displayed.   GFR: Estimated Creatinine Clearance: 24.4 mL/min (A) (by C-G formula based on SCr of 1.25 mg/dL (H)). Liver Function Tests: Recent Labs  Lab 10/10/19 0459  AST 35  ALT 19  ALKPHOS 57  BILITOT 0.5  PROT 6.0*  ALBUMIN 2.5*   No results for input(s): LIPASE, AMYLASE in the last 168 hours. No results for input(s): AMMONIA in the last 168 hours. Coagulation Profile: No results for input(s): INR, PROTIME in the last 168 hours. Cardiac Enzymes: No results for input(s): CKTOTAL, CKMB, CKMBINDEX, TROPONINI in the last 168 hours. BNP (last 3 results) No results for input(s): PROBNP in the last 8760 hours. HbA1C: No results for input(s): HGBA1C in the last 72 hours. CBG: No results for input(s): GLUCAP in the last 168 hours. Lipid Profile: No results for input(s): CHOL, HDL, LDLCALC, TRIG, CHOLHDL, LDLDIRECT in the last 72 hours. Thyroid Function Tests: No results for input(s): TSH, T4TOTAL, FREET4, T3FREE, THYROIDAB in the last 72 hours. Anemia Panel: No results for input(s): VITAMINB12, FOLATE, FERRITIN, TIBC, IRON, RETICCTPCT in the last 72 hours. Sepsis Labs: No results for input(s): PROCALCITON, LATICACIDVEN in the last 168 hours.  Recent  Results (from the past 240 hour(s))  Respiratory Panel by RT PCR (Flu A&B, Covid) - Nasopharyngeal Swab     Status: None   Collection Time: 10/02/19  4:22 AM   Specimen: Nasopharyngeal Swab  Result Value Ref Range Status   SARS Coronavirus 2 by RT PCR NEGATIVE NEGATIVE Final    Comment: (NOTE) SARS-CoV-2 target nucleic acids are NOT DETECTED. The SARS-CoV-2 RNA is generally detectable in upper respiratoy specimens during the acute phase of infection. The lowest concentration of SARS-CoV-2 viral copies this assay can detect is 131 copies/mL. A negative result does not preclude SARS-Cov-2 infection and should not be used as the sole basis for treatment or other  patient management decisions. A negative result may occur with  improper specimen collection/handling, submission of specimen other than nasopharyngeal swab, presence of viral mutation(s) within the areas targeted by this assay, and inadequate number of viral copies (<131 copies/mL). A negative result must be combined with clinical observations, patient history, and epidemiological information. The expected result is Negative. Fact Sheet for Patients:  PinkCheek.be Fact Sheet for Healthcare Providers:  GravelBags.it This test is not yet ap proved or cleared by the [place] FDA and  has been authorized for detection and/or diagnosis of SARS-CoV-2 by FDA under an Emergency Use Authorization (EUA). This EUA will remain  in effect (meaning this test can be used) for the duration of the COVID-19 declaration under Section 564(b)(1) of the Act, 21 U.S.C. section 360bbb-3(b)(1), unless the authorization is terminated or revoked sooner.    Influenza A by PCR NEGATIVE NEGATIVE Final   Influenza B by PCR NEGATIVE NEGATIVE Final    Comment: (NOTE) The Xpert Xpress SARS-CoV-2/FLU/RSV assay is intended as an aid in  the diagnosis of influenza from Nasopharyngeal swab specimens  and  should not be used as a sole basis for treatment. Nasal washings and  aspirates are unacceptable for Xpert Xpress SARS-CoV-2/FLU/RSV  testing. Fact Sheet for Patients: PinkCheek.be Fact Sheet for Healthcare Providers: GravelBags.it This test is not yet approved or cleared by the [place] FDA and  has been authorized for detection and/or diagnosis of SARS-CoV-2 by  FDA under an Emergency Use Authorization (EUA). This EUA will remain  in effect (meaning this test can be used) for the duration of the  Covid-19 declaration under Section 564(b)(1) of the Act, 21  U.S.C. section 360bbb-3(b)(1), unless the authorization is  terminated or revoked. Performed at [facility], [address] [place], [place] [address]   CULTURE, BLOOD (ROUTINE X 2) w Reflex to ID Panel     Status: None   Collection Time: 10/02/19  4:24 PM   Specimen: BLOOD  Result Value Ref Range Status   Specimen Description BLOOD BLOOD LEFT HAND  Final   Special Requests   Final    BOTTLES DRAWN AEROBIC AND ANAEROBIC Blood Culture adequate volume   Culture   Final    NO GROWTH 5 DAYS Performed at [facility], [address] [place], [place] [address]    Report Status 10/07/2019 FINAL  Final  CULTURE, BLOOD (ROUTINE X 2) w Reflex to ID Panel     Status: None   Collection Time: 10/02/19  4:24 PM   Specimen: BLOOD  Result Value Ref Range Status   Specimen Description BLOOD BLOOD LEFT WRIST  Final   Special Requests   Final    BOTTLES DRAWN AEROBIC AND ANAEROBIC Blood Culture adequate volume   Culture   Final    NO GROWTH 5 DAYS Performed at [facility], [address], [place], [place] [address]    Report Status 10/07/2019 FINAL  Final  Urine Culture     Status: Abnormal   Collection Time: 10/08/19  9:00 PM   Specimen: Urine, Random  Result Value Ref Range Status   Specimen Description   Final    URINE,  RANDOM Performed at [facility], [address] [place], [place] [address]    Special Requests   Final    NONE Performed at [facility], [place],  [address]    Culture 40,000 COLONIES/mL VIRIDANS STREPTOCOCCUS (A)  Final   Report Status 10/09/2019 FINAL  Final         Radiology Studies: CT HEAD WO CONTRAST  Result Date: 10/08/2019 CLINICAL DATA:  Encephalopathy. Agitation. EXAM: CT HEAD WITHOUT CONTRAST TECHNIQUE: Contiguous axial images were obtained from the base of the skull through the vertex without intravenous contrast. COMPARISON:  None. FINDINGS: Brain: No intracranial hemorrhage, mass effect, or midline shift. Generalized atrophy, normal for age. No hydrocephalus. The basilar cisterns are patent. Moderate to advanced periventricular white matter hypodensity, nonspecific but typically chronic small vessel ischemia. No evidence of territorial infarct or acute ischemia. No extra-axial or intracranial fluid collection. Vascular: Atherosclerosis of skullbase vasculature without hyperdense vessel or abnormal calcification. Skull: No fracture or focal lesion. Sinuses/Orbits: Mucosal thickening throughout left maxillary sinus. No sinus fluid level. Mastoid air cells are hypoplastic. Orbits are unremarkable. Other: None. IMPRESSION: 1. No acute intracranial abnormality. 2. Age related atrophy. Moderate to advanced periventricular white matter hypodensity, nonspecific but typically chronic small vessel ischemia. Electronically Signed   By: Keith Rake M.D.   On: 10/08/2019 12:38        Scheduled Meds: . amiodarone  200 mg Oral Daily  . amoxicillin-clavulanate  1 tablet Oral Q12H  . vitamin C  500 mg Oral Daily  . aspirin EC  81 mg Oral Daily  . calcium carbonate  1,250 mg Oral Daily  . carvedilol  3.125 mg Oral BID  . cholecalciferol  1,000 Units Oral Daily  . collagenase  1 application Topical Daily  . enoxaparin (LOVENOX) injection   40 mg Subcutaneous Q24H  . feeding supplement (ENSURE ENLIVE)  1 Bottle Oral TID BM  . folic acid  1 mg Oral Daily  . furosemide  20 mg Oral Daily  . guaiFENesin  600 mg Oral BID  . hydroxychloroquine  400 mg Oral Daily  . iron polysaccharides  150 mg Oral Daily  . latanoprost  1 drop Both Eyes QHS  . LORazepam  1 mg Intravenous Once  . mouth rinse  15 mL Mouth Rinse BID  . mirtazapine  30 mg Oral QHS  . multivitamin with minerals  1 tablet Oral Daily  . pravastatin  40 mg Oral QHS  . predniSONE  2.5 mg Oral Q breakfast  . sodium chloride flush  3 mL Intravenous Q12H  . Vitamin D (Ergocalciferol)  50,000 Units Oral Q7 days   Continuous Infusions: . sodium chloride Stopped (10/04/19 2053)     LOS: 7 days    Time spent: 30 mins     Wyvonnia Dusky, MD [facility] Pager [phone]  If 7PM-7AM, please contact night-coverage [email or web address] 10/10/2019, 7:41 AM

## 2019-10-10 NOTE — Plan of Care (Signed)
  Problem: Education: Goal: Ability to demonstrate management of disease process will improve Outcome: Progressing   Problem: Safety: Goal: Ability to remain free from injury will improve Outcome: Progressing

## 2019-10-10 NOTE — Progress Notes (Signed)
Physical Therapy Treatment Patient Details Name: Tina Salinas MRN: [id number] DOB: February 17, 1946 Today's Date: 10/10/2019    History of Present Illness Tina Salinas is a 74 y.o. 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 fib on amiodarone, not on anticoagulation due to fall risk, RA on methotrexate and chronic prednisone who presents to the emergency room with a 3-day history of feeling unwell with cough congestion and shortness of breath, all of which acutely worsened after she got her Covid shot on the day of arrival.    PT Comments    PT/OT co-treatment performed this date.   Min to mod assist x2 semi-supine to sitting edge of bed; min to mod assist x2 to stand x3 trials and CGA to min assist x2 to stand x1 trial up to RW; limited time standing d/t pt fatigue and generalized weakness.  Pt unable to take any steps with RW and 2 assist in standing.  Cueing required to improve upright posture in standing.  Performed ADL's with OT in sitting.  Pt tolerated LE ex's in bed fairly well.  OT present with pt end of session with pt positioned comfortably in bed.  Overall pt improving with sit to stands (requiring less assist).  Will continue to focus on strengthening and progressive functional mobility during hospitalization.   Follow Up Recommendations  SNF     Equipment Recommendations  Rolling walker with 5" wheels;3in1 (PT);Wheelchair (measurements PT);Wheelchair cushion (measurements PT)    Recommendations for Other Services       Precautions / Restrictions Precautions Precautions: Fall Restrictions Weight Bearing Restrictions: No    Mobility  Bed Mobility Overal bed mobility: Needs Assistance Bed Mobility: Supine to Sit;Sit to Supine     Supine to sit: Min assist;Mod assist;+2 for physical assistance;HOB elevated Sit to supine: Mod assist;+2 for physical  assistance; 2 assist to boost up in bed via bed sheets   General bed mobility comments: assist for trunk and B LE's semi-supine to/from sitting edge of bed  Transfers Overall transfer level: Needs assistance Equipment used: Rolling walker (2 wheeled) Transfers: Sit to/from Stand Sit to Stand: Min guard;Min assist;Mod assist;+2 physical assistance         General transfer comment: x4 trials sit to/from stand; 1st 3 trials min to mod assist x2 and 4th trial CGA to min assist x2; assist to initiate and come to standing; pt initially standing with upper trunk flexed, shoulders forward, and head forward requiring cueing for upright (improved with repetition and cueing)  Ambulation/Gait             General Gait Details: pt unable to take any steps with RW use and 2 assist   Stairs             Wheelchair Mobility    Modified Rankin (Stroke Patients Only)       Balance Overall balance assessment: Needs assistance Sitting-balance support: Single extremity supported;Feet supported Sitting balance-Leahy Scale: Fair Sitting balance - Comments: steady static sitting (pt with forward upper trunk/shoulders/head at times requiring cueing for upright); CGA to SBA for safety Postural control: Other (comment)(forward lean) Standing balance support: Bilateral upper extremity supported Standing balance-Leahy Scale: Poor Standing balance comment: 2 assist for safety in standing (CGA x2 initially but with pt fatigue requiring increased assist); RW use  Cognition Arousal/Alertness: Awake/alert Behavior During Therapy: WFL for tasks assessed/performed Overall Cognitive Status: Within Functional Limits for tasks assessed                                        Exercises General Exercises - Lower Extremity Ankle Circles/Pumps: AROM;Strengthening;Both;10 reps;Supine Quad Sets: AROM;Strengthening;Both;10 reps;Supine Short Arc Quad:  AROM;Strengthening;Both;10 reps;Supine Heel Slides: AROM;Strengthening;Both;10 reps;Supine(increased difficulty noted L LE compared to R LE)    General Comments  Pt agreeable to PT session.      Pertinent Vitals/Pain Pain Assessment: No/denies pain Pain Intervention(s): Limited activity within patient's tolerance;Monitored during session;Repositioned  Vitals (HR and O2 on 1 L O2 via nasal cannula) stable and WFL throughout treatment session.    Home Living                      Prior Function            PT Goals (current goals can now be found in the care plan section) Acute Rehab PT Goals Patient Stated Goal: to go home PT Goal Formulation: With patient Time For Goal Achievement: 10/20/19 Potential to Achieve Goals: Fair Progress towards PT goals: Progressing toward goals    Frequency    Min 2X/week      PT Plan Current plan remains appropriate    Co-evaluation PT/OT/SLP Co-Evaluation/Treatment: Yes Reason for Co-Treatment: For patient/therapist safety;To address functional/ADL transfers PT goals addressed during session: Mobility/safety with mobility;Balance;Strengthening/ROM OT goals addressed during session: ADL's and self-care;Proper use of Adaptive equipment and DME      AM-PAC PT "6 Clicks" Mobility   Outcome Measure  Help needed turning from your back to your side while in a flat bed without using bedrails?: A Lot Help needed moving from lying on your back to sitting on the side of a flat bed without using bedrails?: A Lot Help needed moving to and from a bed to a chair (including a wheelchair)?: A Lot Help needed standing up from a chair using your arms (e.g., wheelchair or bedside chair)?: Total Help needed to walk in hospital room?: Total Help needed climbing 3-5 steps with a railing? : Total 6 Click Score: 9    End of Session Equipment Utilized During Treatment: Gait belt;Oxygen Activity Tolerance: Patient limited by fatigue Patient left:  in bed;with call bell/phone within reach;with bed alarm set;Other (comment)(B heels floating via pillow; OT present) Nurse Communication: Mobility status;Precautions(via white board) PT Visit Diagnosis: Muscle weakness (generalized) (M62.81);Difficulty in walking, not elsewhere classified (R26.2)     Time: [id number] PT Time Calculation (min) (ACUTE ONLY): 36 min  Charges:  $Therapeutic Activity: 8-22 mins                     Leitha Bleak, PT 10/10/19, 4:11 PM

## 2019-10-10 NOTE — Progress Notes (Signed)
Occupational Therapy Treatment Patient Details Name: Tina Salinas MRN: [id number] DOB: [birth year] Today's Date: 10/10/2019    History of present illness Tina Salinas is a 74 y.o. 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 fib on amiodarone, not on anticoagulation due to fall risk, RA on methotrexate and chronic prednisone who presents to the emergency room with a 3-day history of feeling unwell with cough congestion and shortness of breath, all of which acutely worsened after she got her Covid shot on the day of arrival.   OT comments  Pt seen for OT/PT co-tx this date. Pt agreeable to participate, eager to return to PLOF as quickly as she can. Min-Mod +2 for bed mobility and ADL transfers with instruction for improved body mechanics and positioning to improve lift off and balance. Pt performed x4 with rest breaks in between. Encouragement and active listening provided to support pt in acknowledging the progress she is making and how to continue progressing moving forward towards her goals. Pt verbalized understanding. EOB, pt able to tolerate sitting with SBA to comb her hair. Mild difficulty reaching back of head with comb but able to perform without assist. Keeps forward flexed posture with head forward. Set up to brush teeth at bed level near end of session 2/2 fatigue. Pt continues to progress towards goals and will benefit from skilled OT services in order to maximize safety and return to PLOF. Continue to recommend SNF at this time for continued therapy.   Follow Up Recommendations  SNF    Equipment Recommendations  3 in 1 bedside commode    Recommendations for Other Services      Precautions / Restrictions Precautions Precautions: Fall Restrictions Weight Bearing Restrictions: No       Mobility Bed Mobility Overal bed mobility: Needs Assistance Bed  Mobility: Supine to Sit;Sit to Supine     Supine to sit: Min assist;Mod assist;+2 for physical assistance;HOB elevated Sit to supine: Mod assist;+2 for physical assistance   General bed mobility comments: assist for trunk and B LE's semi-supine to/from sitting edge of bed  Transfers Overall transfer level: Needs assistance Equipment used: Rolling walker (2 wheeled) Transfers: Sit to/from Stand Sit to Stand: Min guard;Min assist;Mod assist;+2 physical assistance Stand pivot transfers: Max assist       General transfer comment: x4 trials sit to/from stand; 1st 3 trials min to mod assist x2 and 4th trial CGA to min assist x2; assist to initiate and come to standing; pt initially standing with upper trunk flexed, shoulders forward, and head forward requiring cueing for upright (improved with repetition and cueing)    Balance Overall balance assessment: Needs assistance Sitting-balance support: Single extremity supported;Feet supported Sitting balance-Leahy Scale: Fair Sitting balance - Comments: steady static sitting (pt with forward upper trunk/shoulders/head at times requiring cueing for upright); CGA to SBA for safety Postural control: Other (comment)(forward lean) Standing balance support: Bilateral upper extremity supported Standing balance-Leahy Scale: Poor Standing balance comment: 2 assist for safety in standing (CGA x2 initially but with pt fatigue requiring increased assist); RW use                           ADL either performed or assessed with clinical judgement   ADL Overall ADL's : Needs assistance/impaired     Grooming: Sitting;Bed level;Oral care;Wash/dry face;Brushing hair Grooming Details (indicate cue type and reason):  Pt able to comb hair sitting EOB with add'l time and SBA for sitting balance; set up to brush teeth at bed level near end of session 2/2 fatigue                                     Vision Baseline Vision/History: Wears  glasses Wears Glasses: Reading only Patient Visual Report: No change from baseline     Perception     Praxis      Cognition Arousal/Alertness: Awake/alert Behavior During Therapy: WFL for tasks assessed/performed Overall Cognitive Status: Within Functional Limits for tasks assessed                                          Exercises General Exercises - Lower Extremity Ankle Circles/Pumps: AROM;Strengthening;Both;10 reps;Supine Quad Sets: AROM;Strengthening;Both;10 reps;Supine Short Arc Quad: AROM;Strengthening;Both;10 reps;Supine Heel Slides: AROM;Strengthening;Both;10 reps;Supine(increased difficulty noted L LE compared to R LE) Other Exercises Other Exercises: Pt instructed in hand/foot placement and RW during ADL transfer training Other Exercises: Encouragement and active listening provided to pt to support her in acknowledging the progress she has made thus far and the next steps in her therapy to support recovery   Shoulder Instructions       General Comments      Pertinent Vitals/ Pain       Pain Assessment: No/denies pain Pain Intervention(s): Monitored during session;Limited activity within patient's tolerance;Repositioned  Home Living                                          Prior Functioning/Environment              Frequency  Min 1X/week        Progress Toward Goals  OT Goals(current goals can now be found in the care plan section)  Progress towards OT goals: Progressing toward goals  Acute Rehab OT Goals Patient Stated Goal: to go home OT Goal Formulation: With patient Time For Goal Achievement: 10/18/19 Potential to Achieve Goals: [place] Discharge plan remains appropriate;Frequency remains appropriate    Co-evaluation    PT/OT/SLP Co-Evaluation/Treatment: Yes Reason for Co-Treatment: For patient/therapist safety;To address functional/ADL transfers PT goals addressed during session: Mobility/safety  with mobility;Balance;Strengthening/ROM OT goals addressed during session: ADL's and self-care;Proper use of Adaptive equipment and DME      AM-PAC OT "6 Clicks" Daily Activity     Outcome Measure   Help from another person eating meals?: A Little Help from another person taking care of personal grooming?: A Little Help from another person toileting, which includes using toliet, bedpan, or urinal?: A Lot Help from another person bathing (including washing, rinsing, drying)?: A Lot Help from another person to put on and taking off regular upper body clothing?: A Lot Help from another person to put on and taking off regular lower body clothing?: A Lot 6 Click Score: 14    End of Session Equipment Utilized During Treatment: Gait belt;Rolling walker;Oxygen  OT Visit Diagnosis: Unsteadiness on feet (R26.81);Other abnormalities of gait and mobility (R26.89);Muscle weakness (generalized) (M62.81)   Activity Tolerance Patient tolerated treatment well   Patient Left in bed;with call bell/phone within reach;with bed alarm set   Nurse Communication  Time: [id number] OT Time Calculation (min): 48 min  Charges: OT General Charges $OT Visit: 1 Visit OT Treatments $Self Care/Home Management : 8-22 mins $Therapeutic Activity: 8-22 mins  Jeni Salles, MPH, MS, OTR/L ascom [phone] 10/10/19, 4:24 PM

## 2019-10-10 NOTE — Plan of Care (Signed)
  Problem: Activity: Goal: Capacity to carry out activities will improve Outcome: Not Progressing   

## 2019-10-10 NOTE — Progress Notes (Addendum)
PHARMACY NOTE:  ANTIMICROBIAL RENAL DOSAGE ADJUSTMENT  Current antimicrobial regimen includes a mismatch between antimicrobial dosage and estimated renal function.  As per policy approved by the Pharmacy & Therapeutics and Medical Executive Committees, the antimicrobial dosage will be adjusted accordingly.  Current antimicrobial dosage:  Augmentin 875-125 mg Q12H  Patient's renal function has been < 30 mL/min for the past few days. Pharmacy followed renal function to assess need to adjust dose. At this time, will adjust dose.   Indication: Pneumonia   Renal Function:  Estimated Creatinine Clearance: 24.4 mL/min (A) (by C-G formula based on SCr of 1.25 mg/dL (H)). []      On intermittent HD, scheduled: []      On CRRT    Antimicrobial dosage has been changed to:  500 mg Q12H -D/w Dr. [name] on 3/25 about duration of augmentin and as per our discussion patient will be on medication for 1 additional day for a total of 7 days of antibiotics. Therefore,  medication will end today as noted with original order.    Thank you for allowing pharmacy to be a part of this patient's care.  Rowland Lathe, [facility] 10/10/2019 7:46 AM

## 2019-10-10 NOTE — Progress Notes (Signed)
PHARMACIST - PHYSICIAN COMMUNICATION  CONCERNING:  Enoxaparin (Lovenox) for DVT Prophylaxis    RECOMMENDATION: Patient was prescribed enoxaprin 40mg  q24 hours for VTE prophylaxis.   Filed Weights   10/08/19 0545 10/09/19 0414 10/10/19 0600  Weight: 90 lb 9.6 oz (41.1 kg) 92 lb 4.8 oz (41.9 kg) 83 lb 12.8 oz (38 kg)    Body mass index is 16.37 kg/m.  Estimated Creatinine Clearance: 24.4 mL/min (A) (by C-G formula based on SCr of 1.25 mg/dL (H)).  Patient is candidate for enoxaparin 30mg  every 24 hours based on CrCl <24ml/min or weight less than 45kg for women or <57kg for men.  DESCRIPTION: Pharmacy has adjusted enoxaparin dose per [facility] policy.  Patient is now receiving enoxaparin 30mg  every 24 hours.  Rowland Lathe, PharmD Clinical Pharmacist  10/10/2019 7:44 AM

## 2019-10-10 NOTE — TOC Progression Note (Signed)
Transition of Care [facility]) - Progression Note    Patient Details  Name: Tina Salinas MRN: [id number] Date of Birth: 1946-04-26  Transition of Care [facility]) CM/SW Contact  Eileen Stanford, LCSW Phone Number: 10/10/2019, 10:28 AM  Clinical Narrative:   Patient is agreeable to d/c to [facility]. MD notified.    Expected Discharge Plan: [place] Barriers to Discharge: Continued Medical Work up  Expected Discharge Plan and Services Expected Discharge Plan: [employer] In-house Referral: Clinical Social Work   Post Acute Care Choice: [place] arrangements for the past 2 months: [place]: PT, OT [place] Agency: [facility] Date [place]: 10/06/19 Time [place]: [phone] Representative spoke with at [place]: [place] ([place]) Interventions    Readmission Risk Interventions No flowsheet data found.

## 2019-10-11 DIAGNOSIS — I480 Paroxysmal atrial fibrillation: Secondary | ICD-10-CM | POA: Diagnosis not present

## 2019-10-11 DIAGNOSIS — I428 Other cardiomyopathies: Secondary | ICD-10-CM | POA: Diagnosis not present

## 2019-10-11 DIAGNOSIS — M069 Rheumatoid arthritis, unspecified: Secondary | ICD-10-CM | POA: Diagnosis not present

## 2019-10-11 DIAGNOSIS — M6281 Muscle weakness (generalized): Secondary | ICD-10-CM | POA: Diagnosis not present

## 2019-10-11 DIAGNOSIS — J189 Pneumonia, unspecified organism: Secondary | ICD-10-CM | POA: Diagnosis not present

## 2019-10-11 DIAGNOSIS — Z79899 Other long term (current) drug therapy: Secondary | ICD-10-CM | POA: Diagnosis not present

## 2019-10-11 DIAGNOSIS — R319 Hematuria, unspecified: Secondary | ICD-10-CM | POA: Diagnosis not present

## 2019-10-11 DIAGNOSIS — N3281 Overactive bladder: Secondary | ICD-10-CM | POA: Diagnosis not present

## 2019-10-11 DIAGNOSIS — U071 COVID-19: Secondary | ICD-10-CM | POA: Diagnosis not present

## 2019-10-11 DIAGNOSIS — J3089 Other allergic rhinitis: Secondary | ICD-10-CM | POA: Diagnosis not present

## 2019-10-11 DIAGNOSIS — Z955 Presence of coronary angioplasty implant and graft: Secondary | ICD-10-CM | POA: Diagnosis not present

## 2019-10-11 DIAGNOSIS — Z7401 Bed confinement status: Secondary | ICD-10-CM | POA: Diagnosis not present

## 2019-10-11 DIAGNOSIS — R498 Other voice and resonance disorders: Secondary | ICD-10-CM | POA: Diagnosis not present

## 2019-10-11 DIAGNOSIS — N390 Urinary tract infection, site not specified: Secondary | ICD-10-CM | POA: Diagnosis not present

## 2019-10-11 DIAGNOSIS — I4891 Unspecified atrial fibrillation: Secondary | ICD-10-CM | POA: Diagnosis not present

## 2019-10-11 DIAGNOSIS — F419 Anxiety disorder, unspecified: Secondary | ICD-10-CM | POA: Diagnosis not present

## 2019-10-11 DIAGNOSIS — D649 Anemia, unspecified: Secondary | ICD-10-CM | POA: Diagnosis not present

## 2019-10-11 DIAGNOSIS — I10 Essential (primary) hypertension: Secondary | ICD-10-CM | POA: Diagnosis not present

## 2019-10-11 DIAGNOSIS — Z1159 Encounter for screening for other viral diseases: Secondary | ICD-10-CM | POA: Diagnosis not present

## 2019-10-11 DIAGNOSIS — G809 Cerebral palsy, unspecified: Secondary | ICD-10-CM | POA: Diagnosis not present

## 2019-10-11 DIAGNOSIS — R0602 Shortness of breath: Secondary | ICD-10-CM | POA: Diagnosis not present

## 2019-10-11 DIAGNOSIS — Z7952 Long term (current) use of systemic steroids: Secondary | ICD-10-CM | POA: Diagnosis not present

## 2019-10-11 DIAGNOSIS — Z8679 Personal history of other diseases of the circulatory system: Secondary | ICD-10-CM | POA: Diagnosis not present

## 2019-10-11 DIAGNOSIS — I5042 Chronic combined systolic (congestive) and diastolic (congestive) heart failure: Secondary | ICD-10-CM | POA: Diagnosis not present

## 2019-10-11 DIAGNOSIS — I25118 Atherosclerotic heart disease of native coronary artery with other forms of angina pectoris: Secondary | ICD-10-CM | POA: Diagnosis not present

## 2019-10-11 DIAGNOSIS — N183 Chronic kidney disease, stage 3 unspecified: Secondary | ICD-10-CM | POA: Diagnosis not present

## 2019-10-11 DIAGNOSIS — R6889 Other general symptoms and signs: Secondary | ICD-10-CM | POA: Diagnosis not present

## 2019-10-11 DIAGNOSIS — I2510 Atherosclerotic heart disease of native coronary artery without angina pectoris: Secondary | ICD-10-CM | POA: Diagnosis not present

## 2019-10-11 DIAGNOSIS — M2550 Pain in unspecified joint: Secondary | ICD-10-CM | POA: Diagnosis not present

## 2019-10-11 DIAGNOSIS — E785 Hyperlipidemia, unspecified: Secondary | ICD-10-CM | POA: Diagnosis not present

## 2019-10-11 DIAGNOSIS — I110 Hypertensive heart disease with heart failure: Secondary | ICD-10-CM | POA: Diagnosis not present

## 2019-10-11 DIAGNOSIS — Z7982 Long term (current) use of aspirin: Secondary | ICD-10-CM | POA: Diagnosis not present

## 2019-10-11 DIAGNOSIS — R05 Cough: Secondary | ICD-10-CM | POA: Diagnosis not present

## 2019-10-11 DIAGNOSIS — I5043 Acute on chronic combined systolic (congestive) and diastolic (congestive) heart failure: Secondary | ICD-10-CM | POA: Diagnosis not present

## 2019-10-11 DIAGNOSIS — J9601 Acute respiratory failure with hypoxia: Secondary | ICD-10-CM | POA: Diagnosis not present

## 2019-10-11 DIAGNOSIS — I509 Heart failure, unspecified: Secondary | ICD-10-CM | POA: Diagnosis not present

## 2019-10-11 DIAGNOSIS — R069 Unspecified abnormalities of breathing: Secondary | ICD-10-CM | POA: Diagnosis not present

## 2019-10-11 DIAGNOSIS — I5022 Chronic systolic (congestive) heart failure: Secondary | ICD-10-CM | POA: Diagnosis not present

## 2019-10-11 DIAGNOSIS — R488 Other symbolic dysfunctions: Secondary | ICD-10-CM | POA: Diagnosis not present

## 2019-10-11 LAB — CBC
HCT: 33.7 % — ABNORMAL LOW (ref 36.0–46.0)
Hemoglobin: 10.5 g/dL — ABNORMAL LOW (ref 12.0–15.0)
MCH: 31.4 pg (ref 26.0–34.0)
MCHC: 31.2 g/dL (ref 30.0–36.0)
MCV: 100.9 fL — ABNORMAL HIGH (ref 80.0–100.0)
Platelets: 250 10*3/uL (ref 150–400)
RBC: 3.34 MIL/uL — ABNORMAL LOW (ref 3.87–5.11)
RDW: 14.7 % (ref 11.5–15.5)
WBC: 4 10*3/uL (ref 4.0–10.5)
nRBC: 0 % (ref 0.0–0.2)

## 2019-10-11 LAB — COMPREHENSIVE METABOLIC PANEL
ALT: 24 U/L (ref 0–44)
AST: 46 U/L — ABNORMAL HIGH (ref 15–41)
Albumin: 2.3 g/dL — ABNORMAL LOW (ref 3.5–5.0)
Alkaline Phosphatase: 64 U/L (ref 38–126)
Anion gap: 9 (ref 5–15)
BUN: 31 mg/dL — ABNORMAL HIGH (ref 8–23)
CO2: 25 mmol/L (ref 22–32)
Calcium: 8.2 mg/dL — ABNORMAL LOW (ref 8.9–10.3)
Chloride: 105 mmol/L (ref 98–111)
Creatinine, Ser: 1.06 mg/dL — ABNORMAL HIGH (ref 0.44–1.00)
GFR calc Af Amer: >60 mL/min (ref >60)
GFR calc non Af Amer: 52 mL/min — ABNORMAL LOW (ref >60)
Glucose, Bld: 86 mg/dL (ref 70–99)
Potassium: 4.3 mmol/L (ref 3.5–5.1)
Sodium: 139 mmol/L (ref 135–145)
Total Bilirubin: 0.4 mg/dL (ref 0.3–1.2)
Total Protein: 5.8 g/dL — ABNORMAL LOW (ref 6.5–8.1)

## 2019-10-11 MED ORDER — CARVEDILOL 3.125 MG PO TABS: 3.1250 mg | ORAL_TABLET | Freq: Every day | ORAL | 0 refills | Status: DC

## 2019-10-11 NOTE — TOC Transition Note (Signed)
Transition of Care [facility]) - CM/SW Discharge Note   Patient Details  Name: KATESSA VATALARO MRN: [id number] Date of Birth: 1946-03-18  Transition of Care [facility]) CM/SW Contact:  Marshell Garfinkel, RN Phone Number: 10/11/2019, 12:39 PM   Clinical Narrative:     RNCM spoke with Tammy at [facility]. They can accepted patient. FL2 updated for MD signature. Patient requiring acute O2. SHe will be going to room 701. RN updated. EMS packet created. RN to let RNCM know when we can call EMS.   Final next level of care: Skilled Nursing Facility Barriers to Discharge: No Barriers Identified   Patient Goals and CMS Choice Patient states their goals for this hospitalization and ongoing recovery are:: to go home CMS Medicare.gov Compare Post Acute Care list provided to:: Patient Choice offered to / list presented to : Patient  Discharge Placement              Patient chooses bed at: [facility] [place] Patient to be transferred to facility by: EMS Name of family member notified: patient declined calling her listed friend; denies family members    Discharge Plan and Services In-house Referral: Clinical Social Work   Post Acute Care Choice: Home Health                    HH Arranged: PT, OT [facility] Agency: [facility] Date [facility] Agency Contacted: 10/06/19 Time [place]: [phone] Representative spoke with at [place]: [place] ([place]) Interventions     Readmission Risk Interventions No flowsheet data found.
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
## 2019-10-11 NOTE — Discharge Summary (Signed)
Physician Discharge Summary  Tina Salinas [id number] DOB: [birth year] DOA: 10/02/2019  PCP: Birdie Sons, MD  Admit date: 10/02/2019 Discharge date: 10/11/2019  Admitted From: home Disposition: SNF  Recommendations for Outpatient Follow-up:  1. Follow up with PCP in 1-2 weeks 2. F/u cardio in 1 week  Home Health: no Equipment/Devices:  Discharge Condition: stable CODE STATUS: full  Diet recommendation: Heart Healthy   Brief/Interim Summary: HPI was taken from Dr. Damita Dunnings: Tina Salinas is a 74 y.o. 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 fib on amiodarone, not on anticoagulation due to fall risk, RA on methotrexate and chronic prednisone who presents to the emergency room with a 3-day history of feeling unwell with cough congestion and shortness of breath, all of which acutely worsened after she got her Covid shot on the day of arrival.  She denied chest pain, fever or chills.  Denied GI or GU symptoms  ED Course: On arrival in the emergency room O2 saturation was 7880% on room air requiring 5 L to maintain sats in the mid 90s.  She was afebrile with BP 134/76, HR 73 RR 22.  Her blood work significant for white cell count of 11,900, hemoglobin 11.2.  Creatinine 1.14 which is her baseline for CKD 3.  She was noted to have an elevated AST of 45 and total bili of 2.  Pro-Calc 0.25 troponin 68, BNP 2680, Hg normal sinus rhythm chest x-ray showing CHF/volume overload with pulmonary edema and bilateral effusions with small patchy airspace disease in the right mid to upper lung which could reflect developing asymmetric alveolar edema or pneumonia.  Patient was given a dose of Lasix.  Hospitalist consulted for admission  Hospital Course from Dr. Lenna Sciara. [name] 3/24-3/27/21: Pt was found to have pneumonia as well as UTI. Pt was initially treated w/ IV abxs and then was  transitioned to po augmentin. Pt has since completed her course of abxs. Pt did require supplemental oxygen while inpatient & is currently on 1L w/ SaO2 98%, so unlikely that pt needs the oxygen anymore. PT/OT saw the pt and recommended SNF. Pt believes that she care for herself but unfortunately pt does sometimes have trouble feeding herself. Pt has a friend that helps the pt out but cannot take 24-7 care of the pt. Pt is evidently estranged from her mother for approx 12-15 years as per pt's friend, [name]. For more information, please see previous progress notes.   Discharge Diagnoses:  Principal Problem:   Acute on chronic combined systolic and diastolic CHF (congestive heart failure) (HCC) Active Problems:   Essential (primary) hypertension   Rheumatoid arthritis with rheumatoid factor (HCC)   Elevated troponin   AF (paroxysmal atrial fibrillation) (HCC)   Chronic kidney disease (CKD), active medical management without dialysis, stage 3 (moderate)   Acute respiratory failure with hypoxia (HCC)   CAD (coronary artery disease)   Non-ischemic cardiomyopathy (HCC)   CHF (congestive heart failure) (HCC)   Elevated liver enzymes   CAP (community acquired pneumonia)   Hypokalemia   Protein-calorie malnutrition, severe  Acute encephalopathy: etiology unclear, possibly metabolic vs infection. CT brain showed no acute intracranial abnormalities. Blood cxs NGTD. Urine cx growing viridans strept. Back to baseline   UTI: urine cx is growing viridans strept. Completed abx course   Acute respiratory hypoxic failure due to community-acquired multifocal pneumonia: COVID-19 negative, Procalcitonin was 0.25. CT chest shows  multifocal pneumonia. Completed course of abxs. Continue on supplemental oxygen and wean as tolerated. Encourage incentive spirometry. Continue w/ mucinex, robitussin   Acute on chronic combined CHF: w/ non-ischemic cardiomyopathy. Last EF on was 20 to 25%. Elevated troponin suspect  mostly related to demand ischemia from heart failure exacerbation. Continue lasix. Monitor I/Os. Continue on coreg, amiodarone   Hypokalemia: WNL. Will continue to monitor   Essential HTN: continue coreg but hold for MAP <65. Pt has been running consistently low end of normal BP. Asymptomatic.  Rheumatoid arthritis: continue methotrexate and prednisone  PAF: continue amiodarone, coreg. Not currently on systemic anticoagulation due to fall risk. Pt's cardiologist is Dr. Ubaldo Glassing  CKD stage IIIa: at baseline. Will continue to monitor  Transaminitis: resolved  Elevated TSH: 5.2, slightly elevated free T4 1.57. Recommend to repeat TSH level after 4 to 6 weeks and follow with PCP.  Vitamin D deficiency: continue on vitamin D 50,000 units weekly  Vitamin B12 deficiency: continue vitamin B12 supplements. Macrocytosis, folate WNL  Severe protein calorie malnutrition: Encourage use of dietary supplements. Follow-up with dietitian. BMI 17.6  Discharge Instructions  Discharge Instructions    AMB referral to CHF clinic   Complete by: As directed    Amb Referral to Cardiac Rehabilitation   Complete by: As directed    Diagnosis: Heart Failure (see criteria below if ordering Phase II)   Heart Failure Type: Chronic Systolic & Diastolic   After initial evaluation and assessments completed: Virtual Based Care may be provided alone or in conjunction with Phase 2 Cardiac Rehab based on patient barriers.: Yes   Diet - low sodium heart healthy   Complete by: As directed    Discharge instructions   Complete by: As directed    F/u PCP in 1-2 weeks; F/u cardio in 1 week   Increase activity slowly   Complete by: As directed      Allergies as of 10/11/2019      Reactions   Etodolac    Other reaction(s): Unknown   Sulfa Antibiotics    Tetracycline       Medication List    STOP taking these medications   cyclobenzaprine 5 MG tablet Commonly known as: FLEXERIL   fluconazole 150 MG  tablet Commonly known as: DIFLUCAN   ibuprofen 800 MG tablet Commonly known as: ADVIL     TAKE these medications   acyclovir 800 MG tablet Commonly known as: ZOVIRAX Take 1 tablet (800 mg total) by mouth 3 (three) times daily as needed.   alendronate 70 MG tablet Commonly known as: FOSAMAX Take 70 mg by mouth once a week.   ALPRAZolam 0.25 MG tablet Commonly known as: XANAX TAKE ONE TABLET BY MOUTH AT BEDTIME AS NEEDED FOR SLEEP   amiodarone 200 MG tablet Commonly known as: PACERONE Take 200 mg by mouth daily.   aspirin 81 MG tablet Take 81 mg by mouth daily.   betamethasone valerate ointment 0.1 % Commonly known as: VALISONE Apply topically.   CALCIUM CARBONATE PO Take by mouth daily.   carvedilol 3.125 MG tablet Commonly known as: COREG Take 1 tablet (3.125 mg total) by mouth daily. Start taking on: October 12, 2019 What changed: when to take this   cholecalciferol 1000 units tablet Commonly known as: VITAMIN D Take 1,000 Units by mouth daily.   collagenase ointment Commonly known as: SANTYL Apply 1 application topically daily.   cyproheptadine 4 MG tablet Commonly known as: PERIACTIN Take 4 mg by mouth 3 (three) times daily as  needed for allergies.   fluticasone 50 MCG/ACT nasal spray Commonly known as: FLONASE Place 2 sprays into both nostrils daily.   folic acid 1 MG tablet Commonly known as: FOLVITE Take 1 mg by mouth daily.   furosemide 20 MG tablet Commonly known as: LASIX Take 1 tablet (20 mg total) by mouth daily.   GNP FIBER THERAPY PO Take 1 Dose by mouth daily as needed (constipation).   HYDROcodone-acetaminophen 7.5-325 MG tablet Commonly known as: NORCO Take 2 tablets by mouth 3 (three) times daily as needed for moderate pain.   hydroxychloroquine 200 MG tablet Commonly known as: PLAQUENIL Take 400 mg by mouth daily.   Methotrexate (PF) 25 MG/0.5ML Soaj Inject 1.4 mLs into the skin once a week.   mirtazapine 30 MG  tablet Commonly known as: Remeron Take 1 tablet (30 mg total) by mouth at bedtime.   oxybutynin 5 MG tablet Commonly known as: DITROPAN TAKE 1 TABLET TWICE DAILY AS NEEDED  FOR  BLADDER  SPASMS   pravastatin 40 MG tablet Commonly known as: PRAVACHOL Take 1 tablet (40 mg total) by mouth at bedtime.   predniSONE 2.5 MG tablet Commonly known as: DELTASONE Take 2.5 mg by mouth daily with breakfast.   promethazine 25 MG tablet Commonly known as: PHENERGAN Take 25 mg by mouth every 6 (six) hours as needed for nausea or vomiting.   Xalatan 0.005 % ophthalmic solution Generic drug: latanoprost Place 1 drop into both eyes at bedtime.       Contact information for follow-up providers    [facility] Follow up on 10/22/2019.   Specialty: Cardiology Why: at 2:00pm. Enter through the [facility] entrance Contact information: [place] [phone]           Contact information for after-discharge care    Destination    [facility] Preferred [facility]F .   Service: Skilled Nursing Contact information: [place] [phone]                 Allergies  Allergen Reactions  . Etodolac     Other reaction(s): Unknown  . Sulfa Antibiotics   . Tetracycline     Consultations:     Procedures/Studies: CT HEAD WO CONTRAST  Result Date: 10/08/2019 CLINICAL DATA:  Encephalopathy. Agitation. EXAM: CT HEAD WITHOUT CONTRAST TECHNIQUE: Contiguous axial images were obtained from the base of the skull through the vertex without intravenous contrast. COMPARISON:  None. FINDINGS: Brain: No intracranial hemorrhage, mass effect, or midline shift. Generalized atrophy, normal for age. No hydrocephalus. The basilar cisterns are patent. Moderate to advanced periventricular white matter hypodensity, nonspecific but typically chronic small vessel  ischemia. No evidence of territorial infarct or acute ischemia. No extra-axial or intracranial fluid collection. Vascular: Atherosclerosis of skullbase vasculature without hyperdense vessel or abnormal calcification. Skull: No fracture or focal lesion. Sinuses/Orbits: Mucosal thickening throughout left maxillary sinus. No sinus fluid level. Mastoid air cells are hypoplastic. Orbits are unremarkable. Other: None. IMPRESSION: 1. No acute intracranial abnormality. 2. Age related atrophy. Moderate to advanced periventricular white matter hypodensity, nonspecific but typically chronic small vessel ischemia. Electronically Signed   By: Keith Rake M.D.   On: 10/08/2019 12:38   CT CHEST WO CONTRAST  Result Date: 10/02/2019 CLINICAL DATA:  74 year old female with history of shortness of breath. Evaluate for potential pneumonia. EXAM: CT CHEST WITHOUT CONTRAST TECHNIQUE: Multidetector CT imaging of the chest was  performed following the standard protocol without IV contrast. COMPARISON:  No priors. FINDINGS: Cardiovascular: Heart size is mildly enlarged with left ventricular dilatation. Small amount of pericardial fluid and/or thickening, unlikely to be of any hemodynamic significance at this time. No associated pericardial calcification. There is aortic atherosclerosis, as well as atherosclerosis of the great vessels of the mediastinum and the coronary arteries, including calcified atherosclerotic plaque in the left main, left anterior descending, left circumflex and right coronary arteries. Mediastinum/Nodes: No pathologically enlarged mediastinal or hilar lymph nodes. Please note that accurate exclusion of hilar adenopathy is limited on noncontrast CT scans. Esophagus is unremarkable in appearance. No axillary lymphadenopathy. Lungs/Pleura: Moderate bilateral pleural effusions lying dependently. Associated areas of passive atelectasis in the lower lobes of the lungs bilaterally. Patchy areas of ground-glass  attenuation, septal thickening, thickening of the peribronchovascular interstitium and regional architectural distortion noted throughout the lungs bilaterally (right greater than left), concerning for multilobar pneumonia. No definite suspicious appearing pulmonary nodules or masses are noted. Upper Abdomen: Aortic atherosclerosis. Peripherally calcified structure adjacent to the splenic hilum measuring 1.9 x 1.4 cm, likely to represent a splenic artery aneurysm. Musculoskeletal: Bilateral breast implants with dense capsular calcifications incidentally noted. There are no aggressive appearing lytic or blastic lesions noted in the visualized portions of the skeleton. IMPRESSION: 1. The appearance of the lungs is suggestive of multilobar pneumonia, as above. 2. Moderate bilateral pleural effusions lying dependently with areas of passive atelectasis in the dependent portions of the lungs bilaterally. 3. Small amount of pericardial fluid and/or thickening, unlikely to be of hemodynamic significance at this time. No pericardial calcification. 4. Aortic atherosclerosis, in addition to left main and 3 vessel coronary artery disease. Assessment for potential risk factor modification, dietary therapy or pharmacologic therapy may be warranted, if clinically indicated. 5. 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 Electronically Signed   By: Vinnie Langton M.D.   On: 10/02/2019 10:02   DG Chest Portable 1 View  Result Date: 10/02/2019 CLINICAL DATA:  Shortness of breath, cough and congestion for 2 days increased work of breathing EXAM: PORTABLE CHEST 1 VIEW COMPARISON:  Radiograph Nov 16, 2017 FINDINGS: Mixed hazy interstitial and heterogeneous airspace disease most focally within the right mid to upper lung. Bilateral effusions are present, trace on the right and small on the left. There is cardiomegaly which is similar to prior. Calcified and tortuous aorta is again noted. No acute osseous or soft tissue  abnormality. Stable mineralization seen in the right shoulder recess. Likely joint bodies. Severe degenerative changes in the right shoulder with more mild changes on the left. Multilevel degenerative changes in the spine as well. Telemetry leads overlie the chest. Evidence of prior bilateral breast prosthesis. IMPRESSION: 1. Findings findings likely represent some CHF/volume overload with cardiomegaly, pulmonary edema and bilateral effusions. 2. More patchy airspace disease in the right mid to upper lung could reflect developing asymmetric alveolar edema or pneumonia. 3.  Aortic Atherosclerosis (ICD10-I70.0). Electronically Signed   By: Lovena Le M.D.   On: 10/02/2019 02:29   DG Hand Complete Left  Result Date: 09/20/2019 CLINICAL DATA:  74 year old female with fall and left hand pain. EXAM: LEFT HAND - COMPLETE 3+ VIEW COMPARISON:  None. FINDINGS: There is no acute fracture or dislocation. The bones are osteopenic. Focal area of bony defect at the base of the fifth metacarpal, likely chronic. Correlation with point tenderness recommended. The soft tissues are unremarkable. IMPRESSION: 1. No acute fracture or dislocation. 2. Probable chronic bony defect at  the base of the fifth metacarpal. Electronically Signed   By: Anner Crete M.D.   On: 09/20/2019 19:29   ECHOCARDIOGRAM COMPLETE  Result Date: 10/02/2019    ECHOCARDIOGRAM REPORT   Patient Name:   Tina Salinas Date of Exam: 10/02/2019 Medical Rec #:  [id number]    Height:       61.0 in Accession #:    [id number]   Weight:       103.0 lb Date of Birth:  1946-01-29    BSA:          1.425 m Patient Age:    61 years     BP:           121/72 mmHg Patient Gender: F            HR:           64 bpm. Exam Location:  [facility] Procedure: 2D Echo, Color Doppler and Cardiac Doppler Indications:     [id number] CHF-Acute Systolic  History:         Patient has prior history of Echocardiogram examinations.                  Signs/Symptoms:Shortness of Breath and Leg edema;  Risk                  Factors:Hypertension.  Sonographer:     Charmayne Sheer RDCS (AE) Referring Phys:  [id number] Athena Masse Diagnosing Phys: Yolonda Kida MD  Sonographer Comments: Image acquisition challenging due to breast implants. IMPRESSIONS  1. Left ventricular ejection fraction, by estimation, is 20 to 25%. The left ventricle has severely decreased function. The left ventricle demonstrates global hypokinesis. The left ventricular internal cavity size was severely dilated. There is mild left ventricular hypertrophy. Left ventricular diastolic parameters were normal.  2. Right ventricular systolic function is mildly reduced. The right ventricular size is mildly enlarged.  3. Left atrial size was mildly dilated.  4. Right atrial size was mildly dilated.  5. The mitral valve is normal in structure. Moderate mitral valve regurgitation.  6. Tricuspid valve regurgitation is mild to moderate.  7. The aortic valve is grossly normal. Aortic valve regurgitation is not visualized. FINDINGS  Left Ventricle: Left ventricular ejection fraction, by estimation, is 20 to 25%. The left ventricle has severely decreased function. The left ventricle demonstrates global hypokinesis. The left ventricular internal cavity size was severely dilated. There is mild left ventricular hypertrophy. Left ventricular diastolic parameters were normal. Right Ventricle: The right ventricular size is mildly enlarged. No increase in right ventricular wall thickness. Right ventricular systolic function is mildly reduced. Left Atrium: Left atrial size was mildly dilated. Right Atrium: Right atrial size was mildly dilated. Pericardium: Trivial pericardial effusion is present. The pericardial effusion is anterior to the right ventricle. Mitral Valve: The mitral valve is normal in structure. Moderate mitral valve regurgitation. MV peak gradient, 5.0 mmHg. The mean mitral valve gradient is 2.0 mmHg. Tricuspid Valve: The tricuspid valve is grossly  normal. Tricuspid valve regurgitation is mild to moderate. Aortic Valve: The aortic valve is grossly normal. Aortic valve regurgitation is not visualized. Aortic valve mean gradient measures 5.0 mmHg. Aortic valve peak gradient measures 9.4 mmHg. Aortic valve area, by VTI measures 1.65 cm. Pulmonic Valve: The pulmonic valve was normal in structure. Pulmonic valve regurgitation is not visualized. Aorta: The aortic root is normal in size and structure. IAS/Shunts: No atrial level shunt detected by color flow Doppler.  LEFT VENTRICLE PLAX 2D LVIDd:  4.98 cm      Diastology LVIDs:         4.79 cm      LV e' lateral:   5.98 cm/s LV PW:         0.79 cm      LV E/e' lateral: 19.6 LV IVS:        0.66 cm      LV e' medial:    5.11 cm/s LVOT diam:     2.00 cm      LV E/e' medial:  22.9 LV SV:         47 LV SV Index:   33 LVOT Area:     3.14 cm  LV Volumes (MOD) LV vol d, MOD A2C: 127.0 ml LV vol d, MOD A4C: 146.0 ml LV vol s, MOD A2C: 95.0 ml LV vol s, MOD A4C: 110.0 ml LV SV MOD A2C:     32.0 ml LV SV MOD A4C:     146.0 ml LV SV MOD BP:      34.1 ml RIGHT VENTRICLE RV Basal diam:  3.11 cm LEFT ATRIUM             Index       RIGHT ATRIUM          Index LA diam:        3.50 cm 2.46 cm/m  RA Area:     8.10 cm LA Vol (A2C):   34.5 ml 24.22 ml/m RA Volume:   15.90 ml 11.16 ml/m LA Vol (A4C):   52.8 ml 37.06 ml/m LA Biplane Vol: 44.0 ml 30.89 ml/m  AORTIC VALVE                    PULMONIC VALVE AV Area (Vmax):    1.75 cm     PV Vmax:       0.75 m/s AV Area (Vmean):   1.68 cm     PV Vmean:      48.400 cm/s AV Area (VTI):     1.65 cm     PV VTI:        0.107 m AV Vmax:           153.00 cm/s  PV Peak grad:  2.2 mmHg AV Vmean:          105.000 cm/s PV Mean grad:  1.0 mmHg AV VTI:            0.287 m AV Peak Grad:      9.4 mmHg AV Mean Grad:      5.0 mmHg LVOT Vmax:         85.00 cm/s LVOT Vmean:        56.000 cm/s LVOT VTI:          0.151 m LVOT/AV VTI ratio: 0.53  AORTA Ao Root diam: 2.90 cm MITRAL VALVE MV Area  (PHT): 3.91 cm     SHUNTS MV Peak grad:  5.0 mmHg     Systemic VTI:  0.15 m MV Mean grad:  2.0 mmHg     Systemic Diam: 2.00 cm MV Vmax:       1.12 m/s MV Vmean:      64.6 cm/s MV Decel Time: 194 msec MV E velocity: 117.00 cm/s MV A velocity: 57.70 cm/s MV E/A ratio:  2.03 Dwayne D Callwood MD Electronically signed by Yolonda Kida MD Signature Date/Time: 10/02/2019/12:55:59 PM    Final       Subjective: Pt c/o fatigue  Discharge Exam: Vitals:   10/11/19 0530 10/11/19 0902  BP: (!) 98/55 (!) 95/49  Pulse:  68  Resp:  17  Temp:  98 F (36.7 C)  SpO2:  98%   Vitals:   10/10/19 2111 10/11/19 0423 10/11/19 0530 10/11/19 0902  BP:  (!) 95/53 (!) 98/55 (!) 95/49  Pulse: 77 69  68  Resp:  18  17  Temp:  98.1 F (36.7 C)  98 F (36.7 C)  TempSrc:  Oral    SpO2: 100% 98%  98%  Weight:  38.8 kg    Height:        General: Pt is alert, awake, not in acute distress. Frail appearing  Cardiovascular:  S1/S2 +, no rubs, no gallops Respiratory: decreased breath sounds b/l. No wheezes Abdominal: Soft, NT, ND, bowel sounds + Extremities: no edema, no cyanosis    The results of significant diagnostics from this hospitalization (including imaging, microbiology, ancillary and laboratory) are listed below for reference.     Microbiology: Recent Results (from the past 240 hour(s))  Respiratory Panel by RT PCR (Flu A&B, Covid) - Nasopharyngeal Swab     Status: None   Collection Time: 10/02/19  4:22 AM   Specimen: Nasopharyngeal Swab  Result Value Ref Range Status   SARS Coronavirus 2 by RT PCR NEGATIVE NEGATIVE Final    Comment: (NOTE) SARS-CoV-2 target nucleic acids are NOT DETECTED. The SARS-CoV-2 RNA is generally detectable in upper respiratoy specimens during the acute phase of infection. The lowest concentration of SARS-CoV-2 viral copies this assay can detect is 131 copies/mL. A negative result does not preclude SARS-Cov-2 infection and should not be used as the sole basis  for treatment or other patient management decisions. A negative result may occur with  improper specimen collection/handling, submission of specimen other than nasopharyngeal swab, presence of viral mutation(s) within the areas targeted by this assay, and inadequate number of viral copies (<131 copies/mL). A negative result must be combined with clinical observations, patient history, and epidemiological information. The expected result is Negative. Fact Sheet for Patients:  PinkCheek.be Fact Sheet for Healthcare Providers:  GravelBags.it This test is not yet ap proved or cleared by the [place] FDA and  has been authorized for detection and/or diagnosis of SARS-CoV-2 by FDA under an Emergency Use Authorization (EUA). This EUA will remain  in effect (meaning this test can be used) for the duration of the COVID-19 declaration under Section 564(b)(1) of the Act, 21 U.S.C. section 360bbb-3(b)(1), unless the authorization is terminated or revoked sooner.    Influenza A by PCR NEGATIVE NEGATIVE Final   Influenza B by PCR NEGATIVE NEGATIVE Final    Comment: (NOTE) The Xpert Xpress SARS-CoV-2/FLU/RSV assay is intended as an aid in  the diagnosis of influenza from Nasopharyngeal swab specimens and  should not be used as a sole basis for treatment. Nasal washings and  aspirates are unacceptable for Xpert Xpress SARS-CoV-2/FLU/RSV  testing. Fact Sheet for Patients: PinkCheek.be Fact Sheet for Healthcare Providers: GravelBags.it This test is not yet approved or cleared by the [place] FDA and  has been authorized for detection and/or diagnosis of SARS-CoV-2 by  FDA under an Emergency Use Authorization (EUA). This EUA will remain  in effect (meaning this test can be used) for the duration of the  Covid-19 declaration under Section 564(b)(1) of the Act, 21  U.S.C.  section 360bbb-3(b)(1), unless the authorization is  terminated or revoked. Performed at [facility], [place], [place]  [address]   CULTURE, BLOOD (ROUTINE X 2) w Reflex to ID Panel     Status: None   Collection Time: 10/02/19  4:24 PM   Specimen: BLOOD  Result Value Ref Range Status   Specimen Description BLOOD BLOOD LEFT HAND  Final   Special Requests   Final    BOTTLES DRAWN AEROBIC AND ANAEROBIC Blood Culture adequate volume   Culture   Final    NO GROWTH 5 DAYS Performed at [facility], [address] [place], [place] [address]    Report Status 10/07/2019 FINAL  Final  CULTURE, BLOOD (ROUTINE X 2) w Reflex to ID Panel     Status: None   Collection Time: 10/02/19  4:24 PM   Specimen: BLOOD  Result Value Ref Range Status   Specimen Description BLOOD BLOOD LEFT WRIST  Final   Special Requests   Final    BOTTLES DRAWN AEROBIC AND ANAEROBIC Blood Culture adequate volume   Culture   Final    NO GROWTH 5 DAYS Performed at [facility], [address] [place], [place] [address]    Report Status 10/07/2019 FINAL  Final  Urine Culture     Status: Abnormal   Collection Time: 10/08/19  9:00 PM   Specimen: Urine, Random  Result Value Ref Range Status   Specimen Description   Final    URINE, RANDOM Performed at [facility], [address] [place], [place] [address]    Special Requests   Final    NONE Performed at [facility], [address] [place], [place] [address]    Culture 40,000 COLONIES/mL VIRIDANS STREPTOCOCCUS (A)  Final   Report Status 10/09/2019 FINAL  Final     Labs: BNP (last 3 results) Recent Labs    10/02/19 0229  BNP [id number]*   Basic Metabolic Panel: Recent Labs  Lab 10/05/19 0451 10/06/19 0430 10/07/19 0508 10/08/19 1217 10/09/19 0424 10/10/19 0459 10/11/19 0507  NA 143   < > 142 137 140 140 139  K 4.5   < > 4.5 4.6 4.3 3.9 4.3  CL 104   < > 108 104 104 105 105  CO2 26   <  > 25 23 25 24 25   GLUCOSE 95   < > 108* 97 93 88 86  BUN 24*   < > 22 22 27* 29* 31*  CREATININE 1.10*   < > 1.09* 1.21* 1.27* 1.25* 1.06*  CALCIUM 8.4*   < > 8.3* 8.3* 8.3* 8.1* 8.2*  MG 2.2  --   --   --   --   --   --   PHOS 3.1  --   --   --   --   --   --    < > = values in this interval not displayed.   Liver Function Tests: Recent Labs  Lab 10/10/19 0459 10/11/19 0507  AST 35 46*  ALT 19 24  ALKPHOS 57 64  BILITOT 0.5 0.4  PROT 6.0* 5.8*  ALBUMIN 2.5* 2.3*   No results for input(s): LIPASE, AMYLASE in the last 168 hours. No results for input(s): AMMONIA in the last 168 hours. CBC: Recent Labs  Lab 10/07/19 0508 10/08/19 1217 10/09/19 0424 10/10/19 0459 10/11/19 0507  WBC 8.6 7.3 7.2 5.8 4.0  HGB 10.6* 10.5* 10.9* 10.3* 10.5*  HCT 33.8* 33.7* 36.4 34.6* 33.7*  MCV 103.0* 101.5* 102.8* 104.2* 100.9*  PLT 249 259 261 252 250   Cardiac Enzymes: No  results for input(s): CKTOTAL, CKMB, CKMBINDEX, TROPONINI in the last 168 hours. BNP: Invalid input(s): POCBNP CBG: No results for input(s): GLUCAP in the last 168 hours. D-Dimer No results for input(s): DDIMER in the last 72 hours. Hgb A1c No results for input(s): HGBA1C in the last 72 hours. Lipid Profile No results for input(s): CHOL, HDL, LDLCALC, TRIG, CHOLHDL, LDLDIRECT in the last 72 hours. Thyroid function studies No results for input(s): TSH, T4TOTAL, T3FREE, THYROIDAB in the last 72 hours.  Invalid input(s): FREET3 Anemia work up No results for input(s): VITAMINB12, FOLATE, FERRITIN, TIBC, IRON, RETICCTPCT in the last 72 hours. Urinalysis    Component Value Date/Time   COLORURINE YELLOW (A) 10/08/2019 2100   APPEARANCEUR CLEAR (A) 10/08/2019 2100   LABSPEC 1.008 10/08/2019 2100   PHURINE 7.0 10/08/2019 2100   GLUCOSEU NEGATIVE 10/08/2019 2100   HGBUR NEGATIVE 10/08/2019 2100   BILIRUBINUR NEGATIVE 10/08/2019 2100   [place] NEGATIVE 10/08/2019 2100   PROTEINUR NEGATIVE 10/08/2019 2100    NITRITE NEGATIVE 10/08/2019 2100   LEUKOCYTESUR NEGATIVE 10/08/2019 2100   Sepsis Labs Invalid input(s): PROCALCITONIN,  WBC,  LACTICIDVEN Microbiology Recent Results (from the past 240 hour(s))  Respiratory Panel by RT PCR (Flu A&B, Covid) - Nasopharyngeal Swab     Status: None   Collection Time: 10/02/19  4:22 AM   Specimen: Nasopharyngeal Swab  Result Value Ref Range Status   SARS Coronavirus 2 by RT PCR NEGATIVE NEGATIVE Final    Comment: (NOTE) SARS-CoV-2 target nucleic acids are NOT DETECTED. The SARS-CoV-2 RNA is generally detectable in upper respiratoy specimens during the acute phase of infection. The lowest concentration of SARS-CoV-2 viral copies this assay can detect is 131 copies/mL. A negative result does not preclude SARS-Cov-2 infection and should not be used as the sole basis for treatment or other patient management decisions. A negative result may occur with  improper specimen collection/handling, submission of specimen other than nasopharyngeal swab, presence of viral mutation(s) within the areas targeted by this assay, and inadequate number of viral copies (<131 copies/mL). A negative result must be combined with clinical observations, patient history, and epidemiological information. The expected result is Negative. Fact Sheet for Patients:  PinkCheek.be Fact Sheet for Healthcare Providers:  GravelBags.it This test is not yet ap proved or cleared by the [place] FDA and  has been authorized for detection and/or diagnosis of SARS-CoV-2 by FDA under an Emergency Use Authorization (EUA). This EUA will remain  in effect (meaning this test can be used) for the duration of the COVID-19 declaration under Section 564(b)(1) of the Act, 21 U.S.C. section 360bbb-3(b)(1), unless the authorization is terminated or revoked sooner.    Influenza A by PCR NEGATIVE NEGATIVE Final   Influenza B by PCR NEGATIVE  NEGATIVE Final    Comment: (NOTE) The Xpert Xpress SARS-CoV-2/FLU/RSV assay is intended as an aid in  the diagnosis of influenza from Nasopharyngeal swab specimens and  should not be used as a sole basis for treatment. Nasal washings and  aspirates are unacceptable for Xpert Xpress SARS-CoV-2/FLU/RSV  testing. Fact Sheet for Patients: PinkCheek.be Fact Sheet for Healthcare Providers: GravelBags.it This test is not yet approved or cleared by the [place] FDA and  has been authorized for detection and/or diagnosis of SARS-CoV-2 by  FDA under an Emergency Use Authorization (EUA). This EUA will remain  in effect (meaning this test can be used) for the duration of the  Covid-19 declaration under Section 564(b)(1) of the Act, 21  U.S.C. section 360bbb-3(b)(1), unless  the authorization is  terminated or revoked. Performed at [facility], [address] [place], [place] [address]   CULTURE, BLOOD (ROUTINE X 2) w Reflex to ID Panel     Status: None   Collection Time: 10/02/19  4:24 PM   Specimen: BLOOD  Result Value Ref Range Status   Specimen Description BLOOD BLOOD LEFT HAND  Final   Special Requests   Final    BOTTLES DRAWN AEROBIC AND ANAEROBIC Blood Culture adequate volume   Culture   Final    NO GROWTH 5 DAYS Performed at [facility], [address] [place], [place] [address]    Report Status 10/07/2019 FINAL  Final  CULTURE, BLOOD (ROUTINE X 2) w Reflex to ID Panel     Status: None   Collection Time: 10/02/19  4:24 PM   Specimen: BLOOD  Result Value Ref Range Status   Specimen Description BLOOD BLOOD LEFT WRIST  Final   Special Requests   Final    BOTTLES DRAWN AEROBIC AND ANAEROBIC Blood Culture adequate volume   Culture   Final    NO GROWTH 5 DAYS Performed at [facility], [address] [place], [place] [address]    Report Status 10/07/2019 FINAL  Final  Urine Culture      Status: Abnormal   Collection Time: 10/08/19  9:00 PM   Specimen: Urine, Random  Result Value Ref Range Status   Specimen Description   Final    URINE, RANDOM Performed at [facility], [address], [place], [place] [address]    Special Requests   Final    NONE Performed at [facility], [place], [place] [address]    Culture 40,000 COLONIES/mL VIRIDANS STREPTOCOCCUS (A)  Final   Report Status 10/09/2019 FINAL  Final     Time coordinating discharge: Over 30 minutes  SIGNED:   Wyvonnia Dusky, MD  [facility] 10/11/2019, 12:34 PM Pager   If 7PM-7AM, please contact night-coverage [email or web address]

## 2019-10-13 ENCOUNTER — Telehealth: Payer: Self-pay | Admitting: Family

## 2019-10-13 NOTE — Telephone Encounter (Signed)
[facility]oke with [facility] at [facility] who confirmed her follow up appointment with [place] at the [facility] and told me patient is doing well since out of the hospital. She is following a [facility] sodium diet, checking daily weight, and no problems with medications. She has some SOB w/ exertion but otherwise shes ok.    Tina [facility], [place]

## 2019-10-17 ENCOUNTER — Ambulatory Visit: Payer: Medicare HMO | Admitting: Family Medicine

## 2019-10-17 DIAGNOSIS — R0602 Shortness of breath: Secondary | ICD-10-CM | POA: Diagnosis not present

## 2019-10-17 DIAGNOSIS — R05 Cough: Secondary | ICD-10-CM | POA: Diagnosis not present

## 2019-10-17 DIAGNOSIS — I10 Essential (primary) hypertension: Secondary | ICD-10-CM | POA: Diagnosis not present

## 2019-10-17 DIAGNOSIS — R069 Unspecified abnormalities of breathing: Secondary | ICD-10-CM | POA: Diagnosis not present

## 2019-10-17 DIAGNOSIS — I509 Heart failure, unspecified: Secondary | ICD-10-CM | POA: Diagnosis not present

## 2019-10-17 DIAGNOSIS — E785 Hyperlipidemia, unspecified: Secondary | ICD-10-CM | POA: Diagnosis not present

## 2019-10-17 DIAGNOSIS — Z8679 Personal history of other diseases of the circulatory system: Secondary | ICD-10-CM | POA: Diagnosis not present

## 2019-10-18 DIAGNOSIS — D649 Anemia, unspecified: Secondary | ICD-10-CM | POA: Diagnosis not present

## 2019-10-18 DIAGNOSIS — R319 Hematuria, unspecified: Secondary | ICD-10-CM | POA: Diagnosis not present

## 2019-10-18 DIAGNOSIS — N390 Urinary tract infection, site not specified: Secondary | ICD-10-CM | POA: Diagnosis not present

## 2019-10-21 NOTE — Progress Notes (Signed)
Patient ID: Tina [facility], female    DOB: [birth year], 74 y.o.   MRN: [id number]  HPI  Tina [facility] is a 74 y/o female with a history of CAD, HTN, hyperlipidemia and chronic heart failure.   Echo report from 10/02/19 reviewed and showed an EF of 20-25% along with moderate MR and mild/moderate TR. Echo report from 11/16/17 reviewed and showed an EF of 20-25% along with moderate AS/MR.  Admitted 10/02/19 due to shortness of breath. Given IV antibiotics and lasix and then transitioned to oral medications. Did need supplemental oxygen while admitted. Elevated troponin thought to be due to demand ischemia. Discharged after 9 days to SNF.  She presents today for a follow-up visit with a chief complaint of moderate shortness of breath upon minimal exertion. She describes this as chronic in nature having been present for several years. She has associated fatigue, weakness and difficulty sleeping along with this. She denies any abdominal distention, palpitations, pedal edema, chest pain, wheezing, cough, dizziness or weight gain.   She says that she doesn't have much of an appetite because she's currently at [facility] and says that the food is "awful".  Past Medical History:  Diagnosis Date  . Adenoma   . CAP (community acquired pneumonia) 11/16/2017  . Cerebral palsy ([facility])   . Femur fracture ([facility])   . History of blood transfusion   . Hypertension   . Osteoarthritis   . Osteoporosis   . Rheumatoid arthritis [facility])    Past Surgical History:  Procedure Laterality Date  . BREAST ENHANCEMENT SURGERY Bilateral 1980  . COLONOSCOPY    . COLONOSCOPY WITH PROPOFOL N/A 06/18/2019   Procedure: COLONOSCOPY WITH PROPOFOL;  Surgeon: Robert Bellow, MD;  Location: [facility];  Service: [facility];  Laterality: N/A;  . CORONARY ANGIOPLASTY WITH STENT PLACEMENT  07/13/2008, 08/19/2008  . ESOPHAGOGASTRODUODENOSCOPY (EGD) WITH PROPOFOL N/A 06/18/2019   Procedure: ESOPHAGOGASTRODUODENOSCOPY (EGD) WITH PROPOFOL;   Surgeon: Robert Bellow, MD;  Location: [facility];  Service: [facility];  Laterality: N/A;  . HIP ARTHROPLASTY N/A 2000  . JOINT REPLACEMENT    . LEFT HEART CATH AND CORONARY ANGIOGRAPHY N/A 05/16/2017   Procedure: LEFT HEART CATH AND CORONARY ANGIOGRAPHY;  Surgeon: Teodoro Spray, MD;  Location: [facility];  Service: Cardiovascular;  Laterality: N/A;  . TOTAL KNEE ARTHROPLASTY Left 09/2007, 01/2008   Family History  Problem Relation Age of Onset  . Alcohol abuse Father   . Raynaud syndrome Sister    Social History   Tobacco Use  . Smoking status: Never Smoker  . Smokeless tobacco: Never Used  Substance Use Topics  . Alcohol use: No   Allergies  Allergen Reactions  . Etodolac     Other reaction(s): Unknown  . Sulfa Antibiotics   . Tetracycline    Prior to Admission medications   Medication Sig Start Date End Date Taking? Authorizing Provider  acyclovir (ZOVIRAX) 800 MG tablet Take 1 tablet (800 mg total) by mouth 3 (three) times daily as needed. 11/05/18  Yes Birdie Sons, MD  alendronate (FOSAMAX) 70 MG tablet Take 70 mg by mouth once a week.  03/30/14  Yes [provider]  ALPRAZolam Duanne Moron) 0.25 MG tablet TAKE ONE TABLET BY MOUTH AT BEDTIME AS NEEDED FOR SLEEP 03/25/19  Yes Birdie Sons, MD  amiodarone (PACERONE) 200 MG tablet Take 200 mg by mouth daily.   Yes [provider]  aspirin 81 MG tablet Take 81 mg by mouth daily.   Yes  [provider]  betamethasone valerate ointment (VALISONE) 0.1 % Apply topically. 12/23/18 12/23/19 Yes [provider]  Calcium Carbonate Antacid (CALCIUM CARBONATE PO) Take 200 mg by mouth daily.    Yes [provider]  carvedilol (COREG) 3.125 MG tablet Take 1 tablet (3.125 mg total) by mouth daily. 10/12/19 11/11/19 Yes Wyvonnia Dusky, MD  cholecalciferol (VITAMIN D) 1000 UNITS tablet Take 1,000 Units by mouth daily.  04/25/10  Yes [provider]  collagenase (SANTYL)  ointment Apply 1 application topically daily. 10/01/18  Yes Birdie Sons, MD  cyproheptadine (PERIACTIN) 4 MG tablet Take 4 mg by mouth 3 (three) times daily as needed for allergies.   Yes [provider]  fluticasone (FLONASE) 50 MCG/ACT nasal spray Place 2 sprays into both nostrils daily. 12/07/17  Yes Birdie Sons, MD  folic acid (FOLVITE) 1 MG tablet Take 1 mg by mouth daily.   Yes [provider]  furosemide (LASIX) 20 MG tablet Take 1 tablet (20 mg total) by mouth daily. 11/22/17  Yes Pyreddy, Reatha Harps, MD  HYDROcodone-acetaminophen (NORCO) 7.5-325 MG tablet Take 2 tablets by mouth 3 (three) times daily as needed for moderate pain. 08/25/19  Yes Birdie Sons, MD  hydroxychloroquine (PLAQUENIL) 200 MG tablet Take 400 mg by mouth daily.    Yes [provider]  latanoprost (XALATAN) 0.005 % ophthalmic solution Place 1 drop into both eyes at bedtime.   Yes [provider]  Methotrexate, PF, 25 MG/0.5ML SOAJ Inject 1.4 mLs into the skin once a week.   Yes [provider]  Methylcellulose, Laxative, (GNP FIBER THERAPY PO) Take 1 Dose by mouth daily as needed (constipation).    Yes [provider]  mirtazapine (REMERON) 30 MG tablet Take 1 tablet (30 mg total) by mouth at bedtime. 06/06/19  Yes Birdie Sons, MD  oxybutynin (DITROPAN) 5 MG tablet TAKE 1 TABLET TWICE DAILY AS NEEDED  FOR  BLADDER  SPASMS 12/20/18  Yes Birdie Sons, MD  pravastatin (PRAVACHOL) 40 MG tablet Take 1 tablet (40 mg total) by mouth at bedtime. 07/23/19  Yes Birdie Sons, MD  predniSONE (DELTASONE) 2.5 MG tablet Take 2.5 mg by mouth daily with breakfast.   Yes [provider]  promethazine (PHENERGAN) 25 MG tablet Take 25 mg by mouth every 6 (six) hours as needed for nausea or vomiting.   Yes [provider]     Review of Systems  Constitutional: Positive for fatigue. Negative for appetite change.  HENT: Positive for hearing loss (left ear).  Negative for congestion and sore throat.   Eyes: Negative.   Respiratory: Positive for shortness of breath. Negative for cough, chest tightness and wheezing.   Cardiovascular: Negative for chest pain, palpitations and leg swelling.  Gastrointestinal: Negative for abdominal distention and abdominal pain.  Endocrine: Negative.   Genitourinary: Negative.   Musculoskeletal: Negative for back pain and neck pain.  Skin: Negative.   Allergic/Immunologic: Negative.   Neurological: Positive for weakness. Negative for dizziness and light-headedness.  Hematological: Negative for adenopathy. Bruises/bleeds easily.  Psychiatric/Behavioral: Positive for sleep disturbance (sleeping on 2 pillows). Negative for dysphoric mood. The patient is not nervous/anxious.    Vitals:   10/22/19 1347  BP: 103/64  Pulse: 65  Resp: 15  SpO2: 100%  Weight: 81 lb 6 oz (36.9 kg)  Height: 5\' 1"  (1.549 m)   Wt Readings from Last 3 Encounters:  10/22/19 81 lb 6 oz (36.9 kg)  10/11/19 85 lb 9.6 oz (  38.8 kg)  08/25/19 94 lb 12.8 oz (43 kg)   [facility] Results  Component Value Date   CREATININE 1.06 (H) 10/11/2019   CREATININE 1.25 (H) 10/10/2019   CREATININE 1.27 (H) 10/09/2019     Physical Exam Vitals and nursing note reviewed.  Constitutional:      Appearance: She is well-developed.  HENT:     Head: Normocephalic and atraumatic.     Left Ear: Decreased hearing noted.  Neck:     Vascular: No JVD.  Cardiovascular:     Rate and Rhythm: Normal rate. Rhythm irregular.  Pulmonary:     Effort: Pulmonary effort is normal.     Breath sounds: No wheezing or rales.  Abdominal:     General: There is no distension.     Palpations: Abdomen is soft.     Tenderness: There is no abdominal tenderness.  Musculoskeletal:        General: No tenderness.     Cervical back: Normal range of motion and neck supple.  Skin:    General: Skin is warm and dry.  Neurological:     Mental Status: She is alert and oriented to person,  place, and time.  Psychiatric:        Behavior: Behavior normal.        Thought Content: Thought content normal.    Assessment and Plan:  1: Chronic heart failure with reduced ejection fraction- - NYHA class III - euvolemic today - being weighed daily at [facility]; order written for them to call for an overnight weight gain of >2 pounds or a weekly weight gain of >5 pounds - had telemedicine visit with cardiology ([facility]) 07/23/19 - currently getting PT daily  - only able to tolerate carvedilol due to hypotension - BNP 10/02/19 was 2680.0   2: HTN- - BP on the low side - saw PCP Caryn Section) 09/19/19; currently being followed by PCP at [facility] - BMP 10/11/19 reviewed and showed sodium 139, potassium 4.3, creatinine 1.06 and GFR 52   Facility medication list was reviewed.   Due to stability of HF, will not make a return appointment at this time. Written on facility paperwork that she could return at anytime. Patient was comfortable with this plan.

## 2019-10-22 ENCOUNTER — Other Ambulatory Visit: Payer: Self-pay

## 2019-10-22 ENCOUNTER — Encounter: Payer: Self-pay | Admitting: Family

## 2019-10-22 ENCOUNTER — Ambulatory Visit: Payer: Medicare HMO | Attending: Family | Admitting: Family

## 2019-10-22 VITALS — BP 103/64 | HR 65 | Resp 15 | Ht 61.0 in | Wt 81.4 lb

## 2019-10-22 DIAGNOSIS — Z955 Presence of coronary angioplasty implant and graft: Secondary | ICD-10-CM | POA: Insufficient documentation

## 2019-10-22 DIAGNOSIS — G809 Cerebral palsy, unspecified: Secondary | ICD-10-CM | POA: Insufficient documentation

## 2019-10-22 DIAGNOSIS — Z7952 Long term (current) use of systemic steroids: Secondary | ICD-10-CM | POA: Diagnosis not present

## 2019-10-22 DIAGNOSIS — I2510 Atherosclerotic heart disease of native coronary artery without angina pectoris: Secondary | ICD-10-CM | POA: Insufficient documentation

## 2019-10-22 DIAGNOSIS — I509 Heart failure, unspecified: Secondary | ICD-10-CM | POA: Diagnosis not present

## 2019-10-22 DIAGNOSIS — Z79899 Other long term (current) drug therapy: Secondary | ICD-10-CM | POA: Diagnosis not present

## 2019-10-22 DIAGNOSIS — M069 Rheumatoid arthritis, unspecified: Secondary | ICD-10-CM | POA: Diagnosis not present

## 2019-10-22 DIAGNOSIS — R6889 Other general symptoms and signs: Secondary | ICD-10-CM | POA: Diagnosis not present

## 2019-10-22 DIAGNOSIS — E785 Hyperlipidemia, unspecified: Secondary | ICD-10-CM | POA: Diagnosis not present

## 2019-10-22 DIAGNOSIS — N390 Urinary tract infection, site not specified: Secondary | ICD-10-CM | POA: Diagnosis not present

## 2019-10-22 DIAGNOSIS — R319 Hematuria, unspecified: Secondary | ICD-10-CM | POA: Diagnosis not present

## 2019-10-22 DIAGNOSIS — I10 Essential (primary) hypertension: Secondary | ICD-10-CM

## 2019-10-22 DIAGNOSIS — Z7982 Long term (current) use of aspirin: Secondary | ICD-10-CM | POA: Diagnosis not present

## 2019-10-22 DIAGNOSIS — I5022 Chronic systolic (congestive) heart failure: Secondary | ICD-10-CM

## 2019-10-22 DIAGNOSIS — I110 Hypertensive heart disease with heart failure: Secondary | ICD-10-CM | POA: Insufficient documentation

## 2019-10-22 NOTE — Patient Instructions (Signed)
Continue weighing daily and call for an overnight weight gain of > 2 pounds or a weekly weight gain of >5 pounds. 

## 2019-10-24 DIAGNOSIS — E785 Hyperlipidemia, unspecified: Secondary | ICD-10-CM | POA: Diagnosis not present

## 2019-10-24 DIAGNOSIS — R0602 Shortness of breath: Secondary | ICD-10-CM | POA: Diagnosis not present

## 2019-10-24 DIAGNOSIS — Z8679 Personal history of other diseases of the circulatory system: Secondary | ICD-10-CM | POA: Diagnosis not present

## 2019-10-24 DIAGNOSIS — R069 Unspecified abnormalities of breathing: Secondary | ICD-10-CM | POA: Diagnosis not present

## 2019-10-24 DIAGNOSIS — I509 Heart failure, unspecified: Secondary | ICD-10-CM | POA: Diagnosis not present

## 2019-10-24 DIAGNOSIS — M6281 Muscle weakness (generalized): Secondary | ICD-10-CM | POA: Diagnosis not present

## 2019-10-28 DIAGNOSIS — N183 Chronic kidney disease, stage 3 unspecified: Secondary | ICD-10-CM | POA: Diagnosis not present

## 2019-10-28 DIAGNOSIS — I480 Paroxysmal atrial fibrillation: Secondary | ICD-10-CM | POA: Diagnosis not present

## 2019-10-28 DIAGNOSIS — I5022 Chronic systolic (congestive) heart failure: Secondary | ICD-10-CM | POA: Diagnosis not present

## 2019-10-28 DIAGNOSIS — I25118 Atherosclerotic heart disease of native coronary artery with other forms of angina pectoris: Secondary | ICD-10-CM | POA: Diagnosis not present

## 2019-10-28 DIAGNOSIS — J9601 Acute respiratory failure with hypoxia: Secondary | ICD-10-CM | POA: Diagnosis not present

## 2019-10-28 DIAGNOSIS — I5043 Acute on chronic combined systolic (congestive) and diastolic (congestive) heart failure: Secondary | ICD-10-CM | POA: Diagnosis not present

## 2019-10-28 DIAGNOSIS — I428 Other cardiomyopathies: Secondary | ICD-10-CM | POA: Diagnosis not present

## 2019-10-28 DIAGNOSIS — G809 Cerebral palsy, unspecified: Secondary | ICD-10-CM | POA: Diagnosis not present

## 2019-11-05 ENCOUNTER — Telehealth: Payer: Self-pay

## 2019-11-05 ENCOUNTER — Telehealth: Payer: Self-pay | Admitting: Family Medicine

## 2019-11-05 NOTE — Telephone Encounter (Signed)
Left message for patient to call back and schedule Medicare Annual Wellness Visit (AWV) either virtually/audio only OR in office. Whatever the patients preference is.  Last AWV 07/15/18; please schedule at anytime with nurse health advisor at [facility].

## 2019-11-05 NOTE — Telephone Encounter (Signed)
Patient advised as below. Patient says that [facility] has agreed to discharge her tomorrow if she agreed to stay one more day. Patient says that she doesn't need Dr. Caryn Section to check her out now. Patient then requested that Dr. Caryn Section send orders for patient to have home health nursing and Physical therapy set up for when she returns home. I advised patient that this is usually set up by the facility before discharge. I called the main line for [facility] and spoke with Ms. Cravens nurse. She confirmed that before patient is discharged from [facility] they would have a Education officer, museum assess patients needs and set up home health nursing and physical therapy.

## 2019-11-05 NOTE — Telephone Encounter (Signed)
We don't have anything to do with that. They have a medical officer that is charge of admitting and discharging patients.

## 2019-11-05 NOTE — Telephone Encounter (Signed)
Please review. It looks like patient was admitted into [facility] on 10/02/2019 and was discharged on 10/11/2019 to [facility]. Patient is calling requesting to speak with Dr. Caryn Section about checking her out of the facility.

## 2019-11-05 NOTE — Telephone Encounter (Signed)
Copied from [place] [id number]. Topic: General - Inquiry >> Nov 05, 2019  1:36 PM Richardo Priest, [place] wrote: Reason for CRM: Patient called in stating that she would like a call from PCP today. She stated that it is urgent and that she is in a facility and PCP has to check her out. Please advise and call back is [phone]

## 2019-11-06 DIAGNOSIS — E785 Hyperlipidemia, unspecified: Secondary | ICD-10-CM | POA: Diagnosis not present

## 2019-11-06 DIAGNOSIS — D649 Anemia, unspecified: Secondary | ICD-10-CM | POA: Diagnosis not present

## 2019-11-06 DIAGNOSIS — U071 COVID-19: Secondary | ICD-10-CM | POA: Diagnosis not present

## 2019-11-06 DIAGNOSIS — Z8679 Personal history of other diseases of the circulatory system: Secondary | ICD-10-CM | POA: Diagnosis not present

## 2019-11-06 DIAGNOSIS — R0602 Shortness of breath: Secondary | ICD-10-CM | POA: Diagnosis not present

## 2019-11-06 DIAGNOSIS — M069 Rheumatoid arthritis, unspecified: Secondary | ICD-10-CM | POA: Diagnosis not present

## 2019-11-06 DIAGNOSIS — I509 Heart failure, unspecified: Secondary | ICD-10-CM | POA: Diagnosis not present

## 2019-11-06 DIAGNOSIS — I10 Essential (primary) hypertension: Secondary | ICD-10-CM | POA: Diagnosis not present

## 2019-11-06 DIAGNOSIS — N3281 Overactive bladder: Secondary | ICD-10-CM | POA: Diagnosis not present

## 2019-11-06 NOTE — Telephone Encounter (Signed)
Patient states that she is not able to come in for an apt right now.  She is in a facilty. She can not not afford $130. Patient states that medicare states if Dr. Caryn Section writes the order right medicare would cover her stay while in the facilty. Cb- [phone]

## 2019-11-10 ENCOUNTER — Other Ambulatory Visit: Payer: Self-pay

## 2019-11-10 NOTE — Patient Outreach (Signed)
[facility]) Care Management  11/10/2019  Tina [facility] [birth year] [id number]     Transition of Care Referral  Referral Date: 11/10/2019 Referral Source: [facility] Discharge Report Date of Discharge: 11/07/2019 Facility: [facility] Insurance: [facility]    Referral received. Transition of care calls being completed via EMMI-automated calls. RN CM will outreach patient for any red flags received.     Plan: RN CM will close case at this time.   Enzo Montgomery, RN,BSN,CCM [place] Management Telephonic Care Management Coordinator Direct Phone: [phone] Toll Free: [phone] Fax: [phone]

## 2019-11-17 DIAGNOSIS — N183 Chronic kidney disease, stage 3 unspecified: Secondary | ICD-10-CM | POA: Diagnosis not present

## 2019-11-17 DIAGNOSIS — I739 Peripheral vascular disease, unspecified: Secondary | ICD-10-CM | POA: Diagnosis not present

## 2019-11-17 DIAGNOSIS — I130 Hypertensive heart and chronic kidney disease with heart failure and stage 1 through stage 4 chronic kidney disease, or unspecified chronic kidney disease: Secondary | ICD-10-CM | POA: Diagnosis not present

## 2019-11-17 DIAGNOSIS — G8929 Other chronic pain: Secondary | ICD-10-CM | POA: Diagnosis not present

## 2019-11-17 DIAGNOSIS — M05471 Rheumatoid myopathy with rheumatoid arthritis of right ankle and foot: Secondary | ICD-10-CM | POA: Diagnosis not present

## 2019-11-17 DIAGNOSIS — G809 Cerebral palsy, unspecified: Secondary | ICD-10-CM | POA: Diagnosis not present

## 2019-11-17 DIAGNOSIS — M05749 Rheumatoid arthritis with rheumatoid factor of unspecified hand without organ or systems involvement: Secondary | ICD-10-CM | POA: Diagnosis not present

## 2019-11-17 DIAGNOSIS — M158 Other polyosteoarthritis: Secondary | ICD-10-CM | POA: Diagnosis not present

## 2019-11-17 DIAGNOSIS — I5042 Chronic combined systolic (congestive) and diastolic (congestive) heart failure: Secondary | ICD-10-CM | POA: Diagnosis not present

## 2019-11-18 ENCOUNTER — Telehealth: Payer: Self-pay | Admitting: Family Medicine

## 2019-11-18 NOTE — Telephone Encounter (Signed)
LMTCB, PEC Triage Nurse may give patient results  

## 2019-11-18 NOTE — Telephone Encounter (Signed)
Aldrin, PT with [facility], calling to request VO for [facility] PT as patient was recently discharged from rehab facility.    Aldrin also requesting motorized scooter for patient for easier mobility and recommending social work for patient.

## 2019-11-18 NOTE — Telephone Encounter (Signed)
OK for requested PT orders Medicare is very strict about orders for motorized scooters. They require in-office face to face visit that is specifically for evaluation of mobility and medical necessity for motorized scooter.

## 2019-11-19 ENCOUNTER — Telehealth: Payer: Self-pay

## 2019-11-19 ENCOUNTER — Telehealth: Payer: Self-pay | Admitting: Family Medicine

## 2019-11-19 NOTE — Telephone Encounter (Signed)
Copied from [place] [phone]. Topic: General - Other >> Nov 19, 2019 10:14 AM Keene Breath wrote: Reason for CRM: Verbal orders for PT and a mobility scooter.  2x wk 4, 1xwk 4.  CB# [phone]

## 2019-11-19 NOTE — Telephone Encounter (Signed)
Attempted to contact Tina [facility] with [facility], no answer. Left a voicemail. Okay for [facility] triage to advise Tina [facility].

## 2019-11-19 NOTE — Telephone Encounter (Signed)
Copied from [place] [phone]. Topic: General - Other >> Nov 19, 2019  2:45 PM Leward Quan A wrote: Reason for CRM: Patient called very upset stated that someone called her from the office. She states that she will not come to her appointment on 11/24/19 because she can not walk and is not able to leave her house without assistance. She would like a call back from Dr Caryn Section at [facility] [phone]

## 2019-11-19 NOTE — Telephone Encounter (Unsigned)
Copied from [place] [phone]. Topic: General - Other >> Nov 19, 2019  4:06 PM Rainey Pines A wrote: Patient would like a callback from Dr. Sabino Snipes nurse in regards to medication for a rash. Please advise

## 2019-11-20 DIAGNOSIS — M158 Other polyosteoarthritis: Secondary | ICD-10-CM | POA: Diagnosis not present

## 2019-11-20 DIAGNOSIS — I739 Peripheral vascular disease, unspecified: Secondary | ICD-10-CM | POA: Diagnosis not present

## 2019-11-20 DIAGNOSIS — M05749 Rheumatoid arthritis with rheumatoid factor of unspecified hand without organ or systems involvement: Secondary | ICD-10-CM | POA: Diagnosis not present

## 2019-11-20 DIAGNOSIS — G809 Cerebral palsy, unspecified: Secondary | ICD-10-CM | POA: Diagnosis not present

## 2019-11-20 DIAGNOSIS — N183 Chronic kidney disease, stage 3 unspecified: Secondary | ICD-10-CM | POA: Diagnosis not present

## 2019-11-20 DIAGNOSIS — G8929 Other chronic pain: Secondary | ICD-10-CM | POA: Diagnosis not present

## 2019-11-20 DIAGNOSIS — I130 Hypertensive heart and chronic kidney disease with heart failure and stage 1 through stage 4 chronic kidney disease, or unspecified chronic kidney disease: Secondary | ICD-10-CM | POA: Diagnosis not present

## 2019-11-20 DIAGNOSIS — I5042 Chronic combined systolic (congestive) and diastolic (congestive) heart failure: Secondary | ICD-10-CM | POA: Diagnosis not present

## 2019-11-20 DIAGNOSIS — M05471 Rheumatoid myopathy with rheumatoid arthritis of right ankle and foot: Secondary | ICD-10-CM | POA: Diagnosis not present

## 2019-11-20 NOTE — Telephone Encounter (Signed)
Attempted to return call to pt.  Her voicemail was full; unable to leave a message.

## 2019-11-21 ENCOUNTER — Telehealth: Payer: Self-pay | Admitting: Family Medicine

## 2019-11-21 DIAGNOSIS — I5042 Chronic combined systolic (congestive) and diastolic (congestive) heart failure: Secondary | ICD-10-CM | POA: Diagnosis not present

## 2019-11-21 DIAGNOSIS — M158 Other polyosteoarthritis: Secondary | ICD-10-CM | POA: Diagnosis not present

## 2019-11-21 DIAGNOSIS — G8929 Other chronic pain: Secondary | ICD-10-CM | POA: Diagnosis not present

## 2019-11-21 DIAGNOSIS — N183 Chronic kidney disease, stage 3 unspecified: Secondary | ICD-10-CM | POA: Diagnosis not present

## 2019-11-21 DIAGNOSIS — M05749 Rheumatoid arthritis with rheumatoid factor of unspecified hand without organ or systems involvement: Secondary | ICD-10-CM | POA: Diagnosis not present

## 2019-11-21 DIAGNOSIS — G809 Cerebral palsy, unspecified: Secondary | ICD-10-CM | POA: Diagnosis not present

## 2019-11-21 DIAGNOSIS — I130 Hypertensive heart and chronic kidney disease with heart failure and stage 1 through stage 4 chronic kidney disease, or unspecified chronic kidney disease: Secondary | ICD-10-CM | POA: Diagnosis not present

## 2019-11-21 DIAGNOSIS — M05471 Rheumatoid myopathy with rheumatoid arthritis of right ankle and foot: Secondary | ICD-10-CM | POA: Diagnosis not present

## 2019-11-21 DIAGNOSIS — I739 Peripheral vascular disease, unspecified: Secondary | ICD-10-CM | POA: Diagnosis not present

## 2019-11-21 NOTE — Telephone Encounter (Signed)
Attempted to call pt.  Unable to leave vm, as mailbox full.

## 2019-11-21 NOTE — Telephone Encounter (Signed)
Attempted to contact [facility] no answer left a  voicemail. Okay for [facility] triage to give [name] verbal orders.

## 2019-11-21 NOTE — Telephone Encounter (Signed)
I think her appt on Monday is mainly for wheelchair evaluation, as well as hospital follow up. We could change this to a telephone visit.

## 2019-11-21 NOTE — Telephone Encounter (Signed)
Home Health Verbal Orders - Caller/Agency: Glori Bickers [place] Number: [phone] Requesting OT/PT/Skilled Nursing/Social Work/Speech Therapy: OT  Frequency:  Requesting skilled nursing eval  Home Health Aid: 2w4 OT: 2w4 Patient wants a hospital bed and pure wick system

## 2019-11-21 NOTE — Progress Notes (Signed)
Virtual telephone visit    Virtual Visit via Telephone Note   This visit type was conducted due to national recommendations for restrictions regarding the COVID-19 Pandemic (e.g. social distancing) in an effort to limit this patient's exposure and mitigate transmission in our community. Due to her co-morbid illnesses, this patient is at least at moderate risk for complications without adequate follow up. This format is felt to be most appropriate for this patient at this time. The patient did not have access to video technology or had technical difficulties with video requiring transitioning to audio format only (telephone). Physical exam was limited to content and character of the telephone converstion.    Patient location: home Provider location: bfp   Visit Date: 11/24/2019  Today's healthcare provider: Lelon Huh, MD   Chief Complaint  Patient presents with  . Hospitalization Follow-up   Subjective    HPI Follow up Hospitalization/ Transition into Care:  Patient was admitted to [facility] on 10/02/2019 and discharged on 10/11/2019. Upon hospital discharge she was placed in a skilled nursing facility ([place]) She was treated for Acute respiratory failure and CHF. She reports fair compliance with treatment. She reports this condition is improved. Patient has weakness of the extremitis and is unable to walk. Home health has arranged for her to get an a scooter but she is unable to transfer herself out from her bed to her chair.    She also states she needs refill on vitamin D.   She states the PT is coming once  A week, but she thinks she needs more frequent therapy since she can't get out of bed by herself. Has normal bed.   [facility].  Has friend that brings her food a few times per day.   Unable to use walker  Using scooter but can't get herself out of bed to get out of bed due to weakness in legs and arms.   Not getting short of  breath. No more swelling in legs. Elevates head when she sleep due to CHF.      Medications: Outpatient Medications Prior to Visit  Medication Sig  . acyclovir (ZOVIRAX) 800 MG tablet Take 1 tablet (800 mg total) by mouth 3 (three) times daily as needed.  Marland Kitchen alendronate (FOSAMAX) 70 MG tablet Take 70 mg by mouth once a week.   . ALPRAZolam (XANAX) 0.25 MG tablet TAKE ONE TABLET BY MOUTH AT BEDTIME AS NEEDED FOR SLEEP  . amiodarone (PACERONE) 200 MG tablet Take 200 mg by mouth daily.  Marland Kitchen aspirin 81 MG tablet Take 81 mg by mouth daily.  . betamethasone valerate ointment (VALISONE) 0.1 % Apply topically.  . Calcium Carbonate Antacid (CALCIUM CARBONATE PO) Take 200 mg by mouth daily.   . carvedilol (COREG) 3.125 MG tablet Take 1 tablet (3.125 mg total) by mouth daily.  . cholecalciferol (VITAMIN D) 1000 UNITS tablet Take 1,000 Units by mouth daily.   . collagenase (SANTYL) ointment Apply 1 application topically daily.  . cyproheptadine (PERIACTIN) 4 MG tablet Take 4 mg by mouth 3 (three) times daily as needed for allergies.  . fluticasone (FLONASE) 50 MCG/ACT nasal spray Place 2 sprays into both nostrils daily.  . folic acid (FOLVITE) 1 MG tablet Take 1 mg by mouth daily.  . furosemide (LASIX) 20 MG tablet Take 1 tablet (20 mg total) by mouth daily.  Marland Kitchen HYDROcodone-acetaminophen (NORCO) 7.5-325 MG tablet Take 2 tablets by mouth 3 (three) times daily as needed for  moderate pain.  . hydroxychloroquine (PLAQUENIL) 200 MG tablet Take 400 mg by mouth daily.   Marland Kitchen latanoprost (XALATAN) 0.005 % ophthalmic solution Place 1 drop into both eyes at bedtime.  . Methotrexate, PF, 25 MG/0.5ML SOAJ Inject 1.4 mLs into the skin once a week.  . Methylcellulose, Laxative, (GNP FIBER THERAPY PO) Take 1 Dose by mouth daily as needed (constipation).   . mirtazapine (REMERON) 30 MG tablet Take 1 tablet (30 mg total) by mouth at bedtime.  Marland Kitchen oxybutynin (DITROPAN) 5 MG tablet TAKE 1 TABLET TWICE DAILY AS NEEDED  FOR   BLADDER  SPASMS  . pravastatin (PRAVACHOL) 40 MG tablet Take 1 tablet (40 mg total) by mouth at bedtime.  . predniSONE (DELTASONE) 2.5 MG tablet Take 2.5 mg by mouth daily with breakfast.  . promethazine (PHENERGAN) 25 MG tablet Take 25 mg by mouth every 6 (six) hours as needed for nausea or vomiting.   No facility-administered medications prior to visit.    Review of Systems  Constitutional: Negative for appetite change, chills, fatigue and fever.  Respiratory: Negative for chest tightness and shortness of breath.   Cardiovascular: Negative for chest pain and palpitations.  Gastrointestinal: Negative for abdominal pain, nausea and vomiting.  Neurological: Positive for weakness. Negative for dizziness.      Objective    There were no vitals taken for this visit.   Awake, alert, oriented x 3. In no apparent distress   Assessment & Plan     1. Generalized weakness She is unable to ambulate without a wheelchair or scooter, but is unable to to transfer from her regular bed to the wheelchair or scooter. She is also unable to lay flat secondary to congestive heart failure. Due to these conditions she is need of a hospital bed. Will contact home health agency to arrange.   2. Protein-calorie malnutrition, severe Continue home health and encourage 3-4 nutritional supplements a day. She states she does have a neighbor that brings her meals twice a day.   3. Acute on chronic congestive heart failure, unspecified heart failure type ([place])   4. Vitamin D deficiency refill- Vitamin D, Ergocalciferol, (DRISDOL) 1.25 MG (50000 UNIT) CAPS capsule; Take 1 capsule (50,000 Units total) by mouth every 7 (seven) days.  Dispense: 12 capsule; Refill: 4   No follow-ups on file.    I discussed the assessment and treatment plan with the patient. The patient was provided an opportunity to ask questions and all were answered. The patient agreed with the plan and demonstrated an understanding of the  instructions.   The patient was advised to call back or seek an in-person evaluation if the symptoms worsen or if the condition fails to improve as anticipated.  I provided 12 minutes of non-face-to-face time during this encounter.  The entirety of the information documented in the History of Present Illness, Review of Systems and Physical Exam were personally obtained by me. Portions of this information were initially documented by the CMA and reviewed by me for thoroughness and accuracy.     Lelon Huh, MD [facility] [phone] (phone) [phone] (fax)  [place]

## 2019-11-21 NOTE — Telephone Encounter (Signed)
That's fine

## 2019-11-21 NOTE — Telephone Encounter (Signed)
Mailbox is full and unable to take messages

## 2019-11-24 ENCOUNTER — Ambulatory Visit (INDEPENDENT_AMBULATORY_CARE_PROVIDER_SITE_OTHER): Payer: Medicare HMO | Admitting: Family Medicine

## 2019-11-24 DIAGNOSIS — R531 Weakness: Secondary | ICD-10-CM

## 2019-11-24 DIAGNOSIS — I509 Heart failure, unspecified: Secondary | ICD-10-CM

## 2019-11-24 DIAGNOSIS — E43 Unspecified severe protein-calorie malnutrition: Secondary | ICD-10-CM | POA: Diagnosis not present

## 2019-11-24 DIAGNOSIS — E559 Vitamin D deficiency, unspecified: Secondary | ICD-10-CM | POA: Diagnosis not present

## 2019-11-24 MED ORDER — VITAMIN D (ERGOCALCIFEROL) 1.25 MG (50000 UNIT) PO CAPS: 50000.0000 [IU] | ORAL_CAPSULE | ORAL | 4 refills | Status: AC

## 2019-11-24 NOTE — Telephone Encounter (Signed)
Verbal orders given to Stephanie.  °

## 2019-11-25 ENCOUNTER — Telehealth: Payer: Self-pay

## 2019-11-25 DIAGNOSIS — I739 Peripheral vascular disease, unspecified: Secondary | ICD-10-CM | POA: Diagnosis not present

## 2019-11-25 DIAGNOSIS — I130 Hypertensive heart and chronic kidney disease with heart failure and stage 1 through stage 4 chronic kidney disease, or unspecified chronic kidney disease: Secondary | ICD-10-CM | POA: Diagnosis not present

## 2019-11-25 DIAGNOSIS — N183 Chronic kidney disease, stage 3 unspecified: Secondary | ICD-10-CM | POA: Diagnosis not present

## 2019-11-25 DIAGNOSIS — M05749 Rheumatoid arthritis with rheumatoid factor of unspecified hand without organ or systems involvement: Secondary | ICD-10-CM | POA: Diagnosis not present

## 2019-11-25 DIAGNOSIS — G8929 Other chronic pain: Secondary | ICD-10-CM | POA: Diagnosis not present

## 2019-11-25 DIAGNOSIS — M05471 Rheumatoid myopathy with rheumatoid arthritis of right ankle and foot: Secondary | ICD-10-CM | POA: Diagnosis not present

## 2019-11-25 DIAGNOSIS — M158 Other polyosteoarthritis: Secondary | ICD-10-CM | POA: Diagnosis not present

## 2019-11-25 DIAGNOSIS — I5042 Chronic combined systolic (congestive) and diastolic (congestive) heart failure: Secondary | ICD-10-CM | POA: Diagnosis not present

## 2019-11-25 DIAGNOSIS — G809 Cerebral palsy, unspecified: Secondary | ICD-10-CM | POA: Diagnosis not present

## 2019-11-25 NOTE — Telephone Encounter (Signed)
Copied from [place] [phone]. Topic: General - Inquiry >> Nov 25, 2019  3:24 PM Scherrie Gerlach wrote: Reason for CRM: pt states OT came today and they agree she needs a hospital bed. Pt wants to remind Dr Caryn Section to please order the hospital as soon as possible, pt states she needs urgently.

## 2019-11-26 DIAGNOSIS — G8929 Other chronic pain: Secondary | ICD-10-CM | POA: Diagnosis not present

## 2019-11-26 DIAGNOSIS — M05749 Rheumatoid arthritis with rheumatoid factor of unspecified hand without organ or systems involvement: Secondary | ICD-10-CM | POA: Diagnosis not present

## 2019-11-26 DIAGNOSIS — I130 Hypertensive heart and chronic kidney disease with heart failure and stage 1 through stage 4 chronic kidney disease, or unspecified chronic kidney disease: Secondary | ICD-10-CM | POA: Diagnosis not present

## 2019-11-26 DIAGNOSIS — M05471 Rheumatoid myopathy with rheumatoid arthritis of right ankle and foot: Secondary | ICD-10-CM | POA: Diagnosis not present

## 2019-11-26 DIAGNOSIS — M158 Other polyosteoarthritis: Secondary | ICD-10-CM | POA: Diagnosis not present

## 2019-11-26 DIAGNOSIS — I5042 Chronic combined systolic (congestive) and diastolic (congestive) heart failure: Secondary | ICD-10-CM | POA: Diagnosis not present

## 2019-11-26 DIAGNOSIS — N183 Chronic kidney disease, stage 3 unspecified: Secondary | ICD-10-CM | POA: Diagnosis not present

## 2019-11-26 DIAGNOSIS — I739 Peripheral vascular disease, unspecified: Secondary | ICD-10-CM | POA: Diagnosis not present

## 2019-11-26 DIAGNOSIS — G809 Cerebral palsy, unspecified: Secondary | ICD-10-CM | POA: Diagnosis not present

## 2019-11-26 NOTE — Telephone Encounter (Signed)
Patient called to inform Dr Caryn Section that [facility] is waiting on him to fax over the orders for her to get her hospital bed. Ph# [phone] Fax# [phone]

## 2019-11-26 NOTE — Telephone Encounter (Signed)
Pt called in wanting to know if information has been faxed over after medical recorders received paperwork . Please reach out to pt

## 2019-11-26 NOTE — Telephone Encounter (Signed)
The order to [facility] about the hospital bed and [place] was [facility] to [facility]. I don't see that we received order from [facility]. I spoke with pt and she is requesting that we fax an order for hospital bed to [facility]. Please advise. Thanks TNP

## 2019-11-26 NOTE — Telephone Encounter (Signed)
Has this been faxed?

## 2019-11-26 NOTE — Telephone Encounter (Signed)
Order has already been signed and sent to medical records to fax.

## 2019-11-26 NOTE — Telephone Encounter (Signed)
I called [facility] [employer] and was advised that patient has been assigned to sales associate name Karle Starch who was out of the office when I called. I left message for Karle Starch to call back and to fax form for a hospital bed.

## 2019-11-27 DIAGNOSIS — M158 Other polyosteoarthritis: Secondary | ICD-10-CM | POA: Diagnosis not present

## 2019-11-27 DIAGNOSIS — G8929 Other chronic pain: Secondary | ICD-10-CM | POA: Diagnosis not present

## 2019-11-27 DIAGNOSIS — I739 Peripheral vascular disease, unspecified: Secondary | ICD-10-CM | POA: Diagnosis not present

## 2019-11-27 DIAGNOSIS — M05749 Rheumatoid arthritis with rheumatoid factor of unspecified hand without organ or systems involvement: Secondary | ICD-10-CM | POA: Diagnosis not present

## 2019-11-27 DIAGNOSIS — I5042 Chronic combined systolic (congestive) and diastolic (congestive) heart failure: Secondary | ICD-10-CM | POA: Diagnosis not present

## 2019-11-27 DIAGNOSIS — G809 Cerebral palsy, unspecified: Secondary | ICD-10-CM | POA: Diagnosis not present

## 2019-11-27 DIAGNOSIS — I130 Hypertensive heart and chronic kidney disease with heart failure and stage 1 through stage 4 chronic kidney disease, or unspecified chronic kidney disease: Secondary | ICD-10-CM | POA: Diagnosis not present

## 2019-11-27 DIAGNOSIS — N183 Chronic kidney disease, stage 3 unspecified: Secondary | ICD-10-CM | POA: Diagnosis not present

## 2019-11-27 DIAGNOSIS — M05471 Rheumatoid myopathy with rheumatoid arthritis of right ankle and foot: Secondary | ICD-10-CM | POA: Diagnosis not present

## 2019-11-28 ENCOUNTER — Emergency Department: Payer: Medicare HMO

## 2019-11-28 ENCOUNTER — Inpatient Hospital Stay
Admission: EM | Admit: 2019-11-28 | Discharge: 2019-12-05 | DRG: 291 | Disposition: A | Discharge status: Skilled Nursing Facility | Payer: Medicare HMO | Attending: Internal Medicine | Admitting: Internal Medicine

## 2019-11-28 ENCOUNTER — Other Ambulatory Visit: Payer: Self-pay

## 2019-11-28 ENCOUNTER — Encounter: Payer: Self-pay | Admitting: Emergency Medicine

## 2019-11-28 DIAGNOSIS — I959 Hypotension, unspecified: Secondary | ICD-10-CM | POA: Diagnosis not present

## 2019-11-28 DIAGNOSIS — Z7189 Other specified counseling: Secondary | ICD-10-CM | POA: Diagnosis not present

## 2019-11-28 DIAGNOSIS — L8990 Pressure ulcer of unspecified site, unspecified stage: Secondary | ICD-10-CM | POA: Insufficient documentation

## 2019-11-28 DIAGNOSIS — Z515 Encounter for palliative care: Secondary | ICD-10-CM | POA: Diagnosis not present

## 2019-11-28 DIAGNOSIS — M2550 Pain in unspecified joint: Secondary | ICD-10-CM | POA: Diagnosis not present

## 2019-11-28 DIAGNOSIS — E43 Unspecified severe protein-calorie malnutrition: Secondary | ICD-10-CM | POA: Diagnosis present

## 2019-11-28 DIAGNOSIS — I110 Hypertensive heart disease with heart failure: Secondary | ICD-10-CM | POA: Diagnosis not present

## 2019-11-28 DIAGNOSIS — M1990 Unspecified osteoarthritis, unspecified site: Secondary | ICD-10-CM | POA: Diagnosis present

## 2019-11-28 DIAGNOSIS — F0390 Unspecified dementia without behavioral disturbance: Secondary | ICD-10-CM | POA: Diagnosis present

## 2019-11-28 DIAGNOSIS — I130 Hypertensive heart and chronic kidney disease with heart failure and stage 1 through stage 4 chronic kidney disease, or unspecified chronic kidney disease: Secondary | ICD-10-CM | POA: Diagnosis not present

## 2019-11-28 DIAGNOSIS — N1831 Chronic kidney disease, stage 3a: Secondary | ICD-10-CM | POA: Diagnosis not present

## 2019-11-28 DIAGNOSIS — M069 Rheumatoid arthritis, unspecified: Secondary | ICD-10-CM

## 2019-11-28 DIAGNOSIS — R0600 Dyspnea, unspecified: Secondary | ICD-10-CM | POA: Diagnosis not present

## 2019-11-28 DIAGNOSIS — Z888 Allergy status to other drugs, medicaments and biological substances status: Secondary | ICD-10-CM

## 2019-11-28 DIAGNOSIS — G809 Cerebral palsy, unspecified: Secondary | ICD-10-CM | POA: Diagnosis present

## 2019-11-28 DIAGNOSIS — M810 Age-related osteoporosis without current pathological fracture: Secondary | ICD-10-CM | POA: Diagnosis present

## 2019-11-28 DIAGNOSIS — D519 Vitamin B12 deficiency anemia, unspecified: Secondary | ICD-10-CM | POA: Diagnosis not present

## 2019-11-28 DIAGNOSIS — Z7983 Long term (current) use of bisphosphonates: Secondary | ICD-10-CM

## 2019-11-28 DIAGNOSIS — I509 Heart failure, unspecified: Secondary | ICD-10-CM

## 2019-11-28 DIAGNOSIS — I5043 Acute on chronic combined systolic (congestive) and diastolic (congestive) heart failure: Secondary | ICD-10-CM | POA: Diagnosis not present

## 2019-11-28 DIAGNOSIS — R0902 Hypoxemia: Secondary | ICD-10-CM | POA: Diagnosis not present

## 2019-11-28 DIAGNOSIS — I480 Paroxysmal atrial fibrillation: Secondary | ICD-10-CM | POA: Diagnosis not present

## 2019-11-28 DIAGNOSIS — E559 Vitamin D deficiency, unspecified: Secondary | ICD-10-CM | POA: Diagnosis present

## 2019-11-28 DIAGNOSIS — L89152 Pressure ulcer of sacral region, stage 2: Secondary | ICD-10-CM | POA: Diagnosis present

## 2019-11-28 DIAGNOSIS — Z881 Allergy status to other antibiotic agents status: Secondary | ICD-10-CM

## 2019-11-28 DIAGNOSIS — Z7982 Long term (current) use of aspirin: Secondary | ICD-10-CM

## 2019-11-28 DIAGNOSIS — Z811 Family history of alcohol abuse and dependence: Secondary | ICD-10-CM

## 2019-11-28 DIAGNOSIS — F4322 Adjustment disorder with anxiety: Secondary | ICD-10-CM | POA: Diagnosis present

## 2019-11-28 DIAGNOSIS — D631 Anemia in chronic kidney disease: Secondary | ICD-10-CM | POA: Diagnosis present

## 2019-11-28 DIAGNOSIS — T502X5A Adverse effect of carbonic-anhydrase inhibitors, benzothiadiazides and other diuretics, initial encounter: Secondary | ICD-10-CM | POA: Diagnosis not present

## 2019-11-28 DIAGNOSIS — I5022 Chronic systolic (congestive) heart failure: Secondary | ICD-10-CM | POA: Diagnosis not present

## 2019-11-28 DIAGNOSIS — Z7952 Long term (current) use of systemic steroids: Secondary | ICD-10-CM

## 2019-11-28 DIAGNOSIS — E785 Hyperlipidemia, unspecified: Secondary | ICD-10-CM | POA: Diagnosis not present

## 2019-11-28 DIAGNOSIS — Z7401 Bed confinement status: Secondary | ICD-10-CM | POA: Diagnosis not present

## 2019-11-28 DIAGNOSIS — I5023 Acute on chronic systolic (congestive) heart failure: Secondary | ICD-10-CM

## 2019-11-28 DIAGNOSIS — I428 Other cardiomyopathies: Secondary | ICD-10-CM | POA: Diagnosis present

## 2019-11-28 DIAGNOSIS — Z20822 Contact with and (suspected) exposure to covid-19: Secondary | ICD-10-CM | POA: Diagnosis not present

## 2019-11-28 DIAGNOSIS — J9601 Acute respiratory failure with hypoxia: Secondary | ICD-10-CM | POA: Diagnosis not present

## 2019-11-28 DIAGNOSIS — R001 Bradycardia, unspecified: Secondary | ICD-10-CM | POA: Diagnosis not present

## 2019-11-28 DIAGNOSIS — Z882 Allergy status to sulfonamides status: Secondary | ICD-10-CM

## 2019-11-28 DIAGNOSIS — E876 Hypokalemia: Secondary | ICD-10-CM | POA: Diagnosis not present

## 2019-11-28 DIAGNOSIS — Z66 Do not resuscitate: Secondary | ICD-10-CM | POA: Diagnosis not present

## 2019-11-28 DIAGNOSIS — R0602 Shortness of breath: Secondary | ICD-10-CM | POA: Diagnosis not present

## 2019-11-28 DIAGNOSIS — Z681 Body mass index (BMI) 19 or less, adult: Secondary | ICD-10-CM

## 2019-11-28 DIAGNOSIS — F22 Delusional disorders: Secondary | ICD-10-CM | POA: Diagnosis not present

## 2019-11-28 DIAGNOSIS — Z96652 Presence of left artificial knee joint: Secondary | ICD-10-CM | POA: Diagnosis present

## 2019-11-28 DIAGNOSIS — Z79899 Other long term (current) drug therapy: Secondary | ICD-10-CM

## 2019-11-28 DIAGNOSIS — J189 Pneumonia, unspecified organism: Secondary | ICD-10-CM

## 2019-11-28 DIAGNOSIS — I2510 Atherosclerotic heart disease of native coronary artery without angina pectoris: Secondary | ICD-10-CM | POA: Diagnosis present

## 2019-11-28 DIAGNOSIS — R5381 Other malaise: Secondary | ICD-10-CM | POA: Diagnosis not present

## 2019-11-28 DIAGNOSIS — R609 Edema, unspecified: Secondary | ICD-10-CM

## 2019-11-28 DIAGNOSIS — M7989 Other specified soft tissue disorders: Secondary | ICD-10-CM | POA: Diagnosis not present

## 2019-11-28 DIAGNOSIS — M6281 Muscle weakness (generalized): Secondary | ICD-10-CM | POA: Diagnosis not present

## 2019-11-28 DIAGNOSIS — E538 Deficiency of other specified B group vitamins: Secondary | ICD-10-CM | POA: Diagnosis present

## 2019-11-28 DIAGNOSIS — R0689 Other abnormalities of breathing: Secondary | ICD-10-CM | POA: Diagnosis not present

## 2019-11-28 LAB — CBC WITH DIFFERENTIAL/PLATELET
Abs Immature Granulocytes: 0.06 10*3/uL (ref 0.00–0.07)
Basophils Absolute: 0 10*3/uL (ref 0.0–0.1)
Basophils Relative: 0 %
Eosinophils Absolute: 0 10*3/uL (ref 0.0–0.5)
Eosinophils Relative: 0 %
HCT: 31.4 % — ABNORMAL LOW (ref 36.0–46.0)
Hemoglobin: 9.7 g/dL — ABNORMAL LOW (ref 12.0–15.0)
Immature Granulocytes: 1 %
Lymphocytes Relative: 9 %
Lymphs Abs: 0.9 10*3/uL (ref 0.7–4.0)
MCH: 30.2 pg (ref 26.0–34.0)
MCHC: 30.9 g/dL (ref 30.0–36.0)
MCV: 97.8 fL (ref 80.0–100.0)
Monocytes Absolute: 0.7 10*3/uL (ref 0.1–1.0)
Monocytes Relative: 7 %
Neutro Abs: 8.5 10*3/uL — ABNORMAL HIGH (ref 1.7–7.7)
Neutrophils Relative %: 83 %
Platelets: 250 10*3/uL (ref 150–400)
RBC: 3.21 MIL/uL — ABNORMAL LOW (ref 3.87–5.11)
RDW: 16.6 % — ABNORMAL HIGH (ref 11.5–15.5)
WBC: 10.2 10*3/uL (ref 4.0–10.5)
nRBC: 0 % (ref 0.0–0.2)

## 2019-11-28 LAB — SARS CORONAVIRUS 2 BY RT PCR (HOSPITAL ORDER, PERFORMED IN ~~LOC~~ HOSPITAL LAB): SARS Coronavirus 2: NEGATIVE

## 2019-11-28 LAB — COMPREHENSIVE METABOLIC PANEL
ALT: 10 U/L (ref 0–44)
AST: 16 U/L (ref 15–41)
Albumin: 2.5 g/dL — ABNORMAL LOW (ref 3.5–5.0)
Alkaline Phosphatase: 75 U/L (ref 38–126)
Anion gap: 9 (ref 5–15)
BUN: 19 mg/dL (ref 8–23)
CO2: 22 mmol/L (ref 22–32)
Calcium: 8 mg/dL — ABNORMAL LOW (ref 8.9–10.3)
Chloride: 108 mmol/L (ref 98–111)
Creatinine, Ser: 0.82 mg/dL (ref 0.44–1.00)
GFR calc Af Amer: >60 mL/min (ref >60)
GFR calc non Af Amer: >60 mL/min (ref >60)
Glucose, Bld: 95 mg/dL (ref 70–99)
Potassium: 3.6 mmol/L (ref 3.5–5.1)
Sodium: 139 mmol/L (ref 135–145)
Total Bilirubin: 1 mg/dL (ref 0.3–1.2)
Total Protein: 6.5 g/dL (ref 6.5–8.1)

## 2019-11-28 LAB — LACTIC ACID, PLASMA: Lactic Acid, Venous: 1.2 mmol/L (ref 0.5–1.9)

## 2019-11-28 LAB — BRAIN NATRIURETIC PEPTIDE: B Natriuretic Peptide: 3486 pg/mL — ABNORMAL HIGH (ref 0.0–100.0)

## 2019-11-28 LAB — PROTIME-INR
INR: 1.2 (ref 0.8–1.2)
Prothrombin Time: 14.7 seconds (ref 11.4–15.2)

## 2019-11-28 MED ORDER — ONDANSETRON HCL 4 MG/2ML IJ SOLN: 4.0000 mg | Freq: Four times a day (QID) | INTRAMUSCULAR | Status: DC | PRN

## 2019-11-28 MED ORDER — SODIUM CHLORIDE 0.9 % IV SOLN
500.0000 mg | INTRAVENOUS | Status: DC
  Administered 2019-11-28 – 2019-11-30 (×3): 500 mg via INTRAVENOUS
  Filled 2019-11-28 (×4): qty 500

## 2019-11-28 MED ORDER — MIRTAZAPINE 15 MG PO TABS
30.0000 mg | ORAL_TABLET | Freq: Every day | ORAL | Status: DC
  Administered 2019-11-28 – 2019-12-04 (×7): 30 mg via ORAL
  Filled 2019-11-28 (×8): qty 2

## 2019-11-28 MED ORDER — LATANOPROST 0.005 % OP SOLN
1.0000 [drp] | Freq: Every day | OPHTHALMIC | Status: DC
  Administered 2019-11-28 – 2019-12-04 (×7): 1 [drp] via OPHTHALMIC
  Filled 2019-11-28 (×2): qty 2.5

## 2019-11-28 MED ORDER — SODIUM CHLORIDE 0.9 % IV SOLN: 250.0000 mL | INTRAVENOUS | Status: DC | PRN

## 2019-11-28 MED ORDER — FUROSEMIDE 10 MG/ML IJ SOLN: 20.0000 mg | Freq: Two times a day (BID) | INTRAMUSCULAR | Status: DC

## 2019-11-28 MED ORDER — SODIUM CHLORIDE 0.9% FLUSH
3.0000 mL | INTRAVENOUS | Status: DC | PRN
  Administered 2019-11-28 – 2019-12-02 (×4): 3 mL via INTRAVENOUS

## 2019-11-28 MED ORDER — PREDNISONE 2.5 MG PO TABS
2.5000 mg | ORAL_TABLET | Freq: Every day | ORAL | Status: DC
  Administered 2019-11-29 – 2019-12-05 (×7): 2.5 mg via ORAL
  Filled 2019-11-28 (×7): qty 1

## 2019-11-28 MED ORDER — VITAMIN D 25 MCG (1000 UNIT) PO TABS
1000.0000 [IU] | ORAL_TABLET | Freq: Every day | ORAL | Status: DC
  Administered 2019-11-28 – 2019-12-05 (×8): 1000 [IU] via ORAL
  Filled 2019-11-28 (×8): qty 1

## 2019-11-28 MED ORDER — ACETAMINOPHEN 325 MG PO TABS: 650.0000 mg | ORAL_TABLET | ORAL | Status: DC | PRN

## 2019-11-28 MED ORDER — PRAVASTATIN SODIUM 40 MG PO TABS
40.0000 mg | ORAL_TABLET | Freq: Every day | ORAL | Status: DC
  Administered 2019-11-28 – 2019-12-04 (×7): 40 mg via ORAL
  Filled 2019-11-28 (×5): qty 1
  Filled 2019-11-28: qty 2
  Filled 2019-11-28: qty 1

## 2019-11-28 MED ORDER — ENOXAPARIN SODIUM 40 MG/0.4ML ~~LOC~~ SOLN
40.0000 mg | SUBCUTANEOUS | Status: DC
  Administered 2019-11-28 – 2019-12-04 (×7): 40 mg via SUBCUTANEOUS
  Filled 2019-11-28 (×7): qty 0.4

## 2019-11-28 MED ORDER — SODIUM CHLORIDE 0.9% FLUSH
3.0000 mL | Freq: Two times a day (BID) | INTRAVENOUS | Status: DC
  Administered 2019-11-28 – 2019-12-05 (×12): 3 mL via INTRAVENOUS

## 2019-11-28 MED ORDER — ASPIRIN EC 81 MG PO TBEC
81.0000 mg | DELAYED_RELEASE_TABLET | Freq: Every day | ORAL | Status: DC
  Administered 2019-11-28 – 2019-12-05 (×8): 81 mg via ORAL
  Filled 2019-11-28 (×9): qty 1

## 2019-11-28 MED ORDER — VITAMIN D (ERGOCALCIFEROL) 1.25 MG (50000 UNIT) PO CAPS
50000.0000 [IU] | ORAL_CAPSULE | ORAL | Status: DC
  Administered 2019-11-30: 50000 [IU] via ORAL
  Filled 2019-11-28: qty 1

## 2019-11-28 MED ORDER — SODIUM CHLORIDE 0.9 % IV SOLN
2.0000 g | INTRAVENOUS | Status: DC
  Administered 2019-11-28 – 2019-11-30 (×3): 2 g via INTRAVENOUS
  Filled 2019-11-28: qty 2
  Filled 2019-11-28 (×2): qty 20
  Filled 2019-11-28: qty 2
  Filled 2019-11-28: qty 20

## 2019-11-28 MED ORDER — FOLIC ACID 1 MG PO TABS
1.0000 mg | ORAL_TABLET | Freq: Every day | ORAL | Status: DC
  Administered 2019-11-28 – 2019-12-02 (×5): 1 mg via ORAL
  Filled 2019-11-28 (×5): qty 1

## 2019-11-28 MED ORDER — AMIODARONE HCL 200 MG PO TABS
200.0000 mg | ORAL_TABLET | Freq: Every day | ORAL | Status: DC
  Administered 2019-11-30 – 2019-12-05 (×6): 200 mg via ORAL
  Filled 2019-11-28 (×6): qty 1

## 2019-11-28 MED ORDER — FUROSEMIDE 10 MG/ML IJ SOLN
40.0000 mg | Freq: Once | INTRAMUSCULAR | Status: AC
  Administered 2019-11-28: 40 mg via INTRAVENOUS
  Filled 2019-11-28: qty 4

## 2019-11-28 MED ORDER — HYDROXYCHLOROQUINE SULFATE 200 MG PO TABS
400.0000 mg | ORAL_TABLET | Freq: Every day | ORAL | Status: DC
  Administered 2019-11-29 – 2019-12-05 (×7): 400 mg via ORAL
  Filled 2019-11-28 (×7): qty 2

## 2019-11-28 MED ORDER — CALCIUM CARBONATE ANTACID 500 MG PO CHEW
500.0000 mg | CHEWABLE_TABLET | Freq: Every day | ORAL | Status: DC
  Administered 2019-11-28 – 2019-12-05 (×8): 500 mg via ORAL
  Filled 2019-11-28 (×8): qty 3
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
## 2019-11-28 NOTE — ED Triage Notes (Signed)
Pt to ER via EMS form home with c/o increasing SHOB for last several days.  PT denies pain, noted increased respiratory effort O2 sats ranging form 88-94%, pt placed on 2L [place] for comfort at this time

## 2019-11-28 NOTE — ED Provider Notes (Signed)
-----------------------------------------   4:07 PM on 11/28/2019 -----------------------------------------  I took over care of this patient from Dr. [name].  She has CHF and possible pneumonia and is receiving Lasix.  Initially the patient had expressed a strong desire to go home although this was not recommended.  She now agrees to stay.  She is stable on 2 L of O2 by nasal cannula.  I discussed her case with the hospitalist for admission.   Arta Silence, MD 11/28/19 [phone]

## 2019-11-28 NOTE — Plan of Care (Signed)
  Problem: Safety: Goal: Ability to remain free from injury will improve Outcome: Progressing   Problem: Clinical Measurements: Goal: Will remain free from infection Outcome: Progressing

## 2019-11-28 NOTE — H&P (Signed)
History and Physical    Tina Salinas [id number] DOB: [birth year] DOA: 11/28/2019  PCP: Birdie Sons, MD  Patient coming from: home   Chief Complaint: sob  HPI: Tina Salinas is a 74 y.o. 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 fib on amiodarone, not on anticoagulation due to fall risk, RA on methotrexate and chronic prednisone who presents to the emergency room with a couple of days of not feeling well and today with sob and wheezing.  Denies chest pain, fever, chills.  She was found with increased respiratory effort with O2 sats ranging from 88 to 94% and patient was placed on 2 L nasal cannula had comfort in the ER.  ED Course: In the ED he was found with a BNP of 3400, lactic acid of 1.2, WBC of 10.2.  Covid was negative.  X-ray was noted to have patchy bilateral airspace disease could reflect asymmetric edema or infection and a small left effusion.  He was given Lasix 40 mg IV x1 and ceftriaxone and azithromycin.  Blood culture and urine culture were drawn.  Review of Systems: All systems reviewed and otherwise negative.    Past Medical History:  Diagnosis Date  . Adenoma   . CAP (community acquired pneumonia) 11/16/2017  . Cerebral palsy ([place])   . CHF (congestive heart failure) ([place])   . Femur fracture ([place])   . History of blood transfusion   . Hypertension   . Osteoarthritis   . Osteoporosis   . Rheumatoid arthritis [facility])     Past Surgical History:  Procedure Laterality Date  . BREAST ENHANCEMENT SURGERY Bilateral 1980  . COLONOSCOPY    . COLONOSCOPY WITH PROPOFOL N/A 06/18/2019   Procedure: COLONOSCOPY WITH PROPOFOL;  Surgeon: Robert Bellow, MD;  Location: [facility] ENDOSCOPY;  Service: Endoscopy;  Laterality: N/A;  . CORONARY ANGIOPLASTY WITH STENT PLACEMENT  07/13/2008,  08/19/2008  . ESOPHAGOGASTRODUODENOSCOPY (EGD) WITH PROPOFOL N/A 06/18/2019   Procedure: ESOPHAGOGASTRODUODENOSCOPY (EGD) WITH PROPOFOL;  Surgeon: Robert Bellow, MD;  Location: [facility] ENDOSCOPY;  Service: Endoscopy;  Laterality: N/A;  . HIP ARTHROPLASTY N/A 2000  . JOINT REPLACEMENT    . LEFT HEART CATH AND CORONARY ANGIOGRAPHY N/A 05/16/2017   Procedure: LEFT HEART CATH AND CORONARY ANGIOGRAPHY;  Surgeon: Teodoro Spray, MD;  Location: [facility];  Service: Cardiovascular;  Laterality: N/A;  . TOTAL KNEE ARTHROPLASTY Left 09/2007, 01/2008     reports that she has never smoked. She has never used smokeless tobacco. She reports that she does not drink alcohol or use drugs.  Allergies  Allergen Reactions  . Etodolac     Other reaction(s): Unknown  . Sulfa Antibiotics   . Tetracycline     Family History  Problem Relation Age of Onset  . Alcohol abuse Father   . Raynaud syndrome Sister      Prior to Admission medications   Medication Sig Start Date End Date Taking? Authorizing Provider  acyclovir (ZOVIRAX) 800 MG tablet Take 1 tablet (800 mg total) by mouth 3 (three) times daily as needed. Patient not taking: Reported on 11/24/2019 11/05/18   Birdie Sons, MD  alendronate (FOSAMAX) 70 MG tablet Take 70 mg by mouth once a week.  03/30/14   [provider]  ALPRAZolam (XANAX) 0.25 MG tablet TAKE ONE TABLET BY MOUTH AT BEDTIME AS NEEDED  FOR SLEEP Patient not taking: Reported on 11/24/2019 03/25/19   Birdie Sons, MD  amiodarone (PACERONE) 200 MG tablet Take 200 mg by mouth daily.    [provider]  aspirin 81 MG tablet Take 81 mg by mouth daily.    [provider]  betamethasone valerate ointment (VALISONE) 0.1 % Apply topically. 12/23/18 12/23/19  [provider]  Calcium Carbonate Antacid (CALCIUM CARBONATE PO) Take 200 mg by mouth daily.     [provider]  carvedilol (COREG) 3.125 MG tablet Take 1 tablet (3.125 mg total) by  mouth daily. 10/12/19 11/11/19  Wyvonnia Dusky, MD  cholecalciferol (VITAMIN D) 1000 UNITS tablet Take 1,000 Units by mouth daily.  04/25/10   [provider]  collagenase (SANTYL) ointment Apply 1 application topically daily. 10/01/18   Birdie Sons, MD  cyproheptadine (PERIACTIN) 4 MG tablet Take 4 mg by mouth 3 (three) times daily as needed for allergies.    [provider]  fluticasone (FLONASE) 50 MCG/ACT nasal spray Place 2 sprays into both nostrils daily. Patient not taking: Reported on 11/24/2019 12/07/17   Birdie Sons, MD  folic acid (FOLVITE) 1 MG tablet Take 1 mg by mouth daily.    [provider]  furosemide (LASIX) 20 MG tablet Take 1 tablet (20 mg total) by mouth daily. 11/22/17   Saundra Shelling, MD  HYDROcodone-acetaminophen (NORCO) 7.5-325 MG tablet Take 2 tablets by mouth 3 (three) times daily as needed for moderate pain. 08/25/19   Birdie Sons, MD  hydroxychloroquine (PLAQUENIL) 200 MG tablet Take 400 mg by mouth daily.     [provider]  latanoprost (XALATAN) 0.005 % ophthalmic solution Place 1 drop into both eyes at bedtime.    [provider]  Methotrexate, PF, 25 MG/0.5ML SOAJ Inject 1.4 mLs into the skin once a week.    [provider]  Methylcellulose, Laxative, (GNP FIBER THERAPY PO) Take 1 Dose by mouth daily as needed (constipation).     [provider]  mirtazapine (REMERON) 30 MG tablet Take 1 tablet (30 mg total) by mouth at bedtime. 06/06/19   Birdie Sons, MD  oxybutynin (DITROPAN) 5 MG tablet TAKE 1 TABLET TWICE DAILY AS NEEDED  FOR  BLADDER  SPASMS 12/20/18   Birdie Sons, MD  pravastatin (PRAVACHOL) 40 MG tablet Take 1 tablet (40 mg total) by mouth at bedtime. 07/23/19   Birdie Sons, MD  predniSONE (DELTASONE) 2.5 MG tablet Take 2.5 mg by mouth daily with breakfast.    [provider]  promethazine (PHENERGAN) 25 MG tablet Take 25 mg by mouth every 6 (six) hours as needed  for nausea or vomiting.    [provider]  Vitamin D, Ergocalciferol, (DRISDOL) 1.25 MG (50000 UNIT) CAPS capsule Take 1 capsule (50,000 Units total) by mouth every 7 (seven) days. 11/24/19   Birdie Sons, MD    Physical Exam: Vitals:   11/28/19 1330 11/28/19 1400 11/28/19 1430 11/28/19 1500  BP: 116/65 114/73 111/65 102/67  Pulse: 76 74 74 73  Resp: (!) 30 (!) 24 16 (!) 29  Temp:      TempSrc:      SpO2: 96% 94% 94% 95%  Weight:      Height:        Constitutional: NAD, calm, comfortable Vitals:   11/28/19 1330 11/28/19 1400 11/28/19 1430 11/28/19 1500  BP: 116/65 114/73 111/65 102/67  Pulse: 76 74 74 73  Resp: (!) 30 (!) 24  16 (!) 29  Temp:      TempSrc:      SpO2: 96% 94% 94% 95%  Weight:      Height:       Eyes: PERRL, EOMI ENMT: Mucous membranes somewhat dry Neck: normal, supple Respiratory: no wheezing, decrease bs at Lt base, +scattered rales Cardiovascular: Regular rate and rhythm, no murmurs / rubs / gallops. No extremity edema.  Abdomen: no tenderness, no masses palpated. No hepatosplenomegaly. Bowel sounds positive.  Musculoskeletal: no clubbing / cyanosis. Skin: Warm, dry, no rashes Neurologic: CN 2-12 grossly intact.  Psychiatric: Normal judgment and insight. Alert and oriented x 3. Normal mood.    Labs on Admission: I have personally reviewed following labs and imaging studies  CBC: Recent Labs  Lab 11/28/19 1400  WBC 10.2  NEUTROABS 8.5*  HGB 9.7*  HCT 31.4*  MCV 97.8  PLT [id number]   Basic Metabolic Panel: Recent Labs  Lab 11/28/19 1400  NA 139  K 3.6  CL 108  CO2 22  GLUCOSE 95  BUN 19  CREATININE 0.82  CALCIUM 8.0*   GFR: Estimated Creatinine Clearance: 46.8 mL/min (by C-G formula based on SCr of 0.82 mg/dL). Liver Function Tests: Recent Labs  Lab 11/28/19 1400  AST 16  ALT 10  ALKPHOS 75  BILITOT 1.0  PROT 6.5  ALBUMIN 2.5*   No results for input(s): LIPASE, AMYLASE in the last 168 hours. No results for  input(s): AMMONIA in the last 168 hours. Coagulation Profile: Recent Labs  Lab 11/28/19 1400  INR 1.2   Cardiac Enzymes: No results for input(s): CKTOTAL, CKMB, CKMBINDEX, TROPONINI in the last 168 hours. BNP (last 3 results) No results for input(s): PROBNP in the last 8760 hours. HbA1C: No results for input(s): HGBA1C in the last 72 hours. CBG: No results for input(s): GLUCAP in the last 168 hours. Lipid Profile: No results for input(s): CHOL, HDL, LDLCALC, TRIG, CHOLHDL, LDLDIRECT in the last 72 hours. Thyroid Function Tests: No results for input(s): TSH, T4TOTAL, FREET4, T3FREE, THYROIDAB in the last 72 hours. Anemia Panel: No results for input(s): VITAMINB12, FOLATE, FERRITIN, TIBC, IRON, RETICCTPCT in the last 72 hours. Urine analysis:    Component Value Date/Time   COLORURINE YELLOW (A) 10/08/2019 2100   APPEARANCEUR CLEAR (A) 10/08/2019 2100   LABSPEC 1.008 10/08/2019 2100   PHURINE 7.0 10/08/2019 2100   GLUCOSEU NEGATIVE 10/08/2019 2100   HGBUR NEGATIVE 10/08/2019 2100   [place] NEGATIVE 10/08/2019 2100   [place] NEGATIVE 10/08/2019 2100   PROTEINUR NEGATIVE 10/08/2019 2100   NITRITE NEGATIVE 10/08/2019 2100   LEUKOCYTESUR NEGATIVE 10/08/2019 2100    Radiological Exams on Admission: DG Chest Port 1 View  Result Date: 11/28/2019 CLINICAL DATA:  Shortness of breath EXAM: PORTABLE CHEST 1 VIEW COMPARISON:  10/02/2019 FINDINGS: Patchy bilateral airspace disease again noted, improved in the right upper lobe but increasing in the right lower lobe and left perihilar region. Heart is mildly enlarged. Small left pleural effusion. No visible right effusion. No acute bony abnormality. IMPRESSION: Patchy bilateral airspace disease could reflect asymmetric edema or infection. Small left effusion. Electronically Signed   By: Rolm Baptise M.D.   On: 11/28/2019 13:35    EKG: Independently reviewed. SR, anteroseptal q.  Assessment/Plan Active Problems:   * No active  hospital problems. *    Acute on chronic combined CHF: w/ non-ischemic cardiomyopathy. Last EF on was 20 to 25%. BNP elevated Given lasix 40mg  iv x1 in ER, BP low now, can start lasix 20mg   iv bid in am, but if bp low may need ionotropes Cards consulted-Dr. Jerrye Beavers  ?PNA- cxr with edema v.s. pna. Recently was admitted for pna/chf. Will continue iv abx started in ED. F/u procalcitonin   Rheumatoid arthritis: continue plaquenil and prednisone home dose  PAF: continue amiodarone. Not currently on systemic anticoagulation due to fall risk.  Pt's cardiologist is Dr. Ubaldo Glassing  CKD stage IIIa: at baseline. Will continue to monitor   Vitamin D deficiency: continue on vitamin D 50,000 units weekly  DL- on statin, will continue  PT/OT  DVT prophylaxis: lovenox  Code Status: full  Family Communication: friend at bedside  Disposition Plan: back to home environment  Consults called: cardiology  Admission status: inpatient as pt needs more >2 mn stays.     Nolberto Hanlon MD [facility] Pager [phone]   If 7PM-7AM, please contact night-coverage [email or web address] Password [facility]  11/28/2019, 4:30 PM

## 2019-11-28 NOTE — ED Provider Notes (Addendum)
[facility] ED provider Note       Time seen: ----------------------------------------- 12:57 PM on 11/28/2019 -----------------------------------------   I have reviewed the vital signs and the nursing notes.  HISTORY   Chief Complaint No chief complaint on file.    HPI Tina Salinas is a 74 y.o. female with a history of community-acquired pneumonia, cerebral palsy, CHF, hypertension, rheumatoid arthritis, who presents to the ER for shortness of breath.  Patient reports shortness of breath for several days with a cough.  She denies fevers, chills, denies chest pain, denies vomiting or diarrhea.  Past Medical History:  Diagnosis Date  . Adenoma   . CAP (community acquired pneumonia) 11/16/2017  . Cerebral palsy ([place])   . CHF (congestive heart failure) ([place])   . Femur fracture ([place])   . History of blood transfusion   . Hypertension   . Osteoarthritis   . Osteoporosis   . Rheumatoid arthritis [facility])     Past Surgical History:  Procedure Laterality Date  . BREAST ENHANCEMENT SURGERY Bilateral 1980  . COLONOSCOPY    . COLONOSCOPY WITH PROPOFOL N/A 06/18/2019   Procedure: COLONOSCOPY WITH PROPOFOL;  Surgeon: Robert Bellow, MD;  Location: [facility] ENDOSCOPY;  Service: Endoscopy;  Laterality: N/A;  . CORONARY ANGIOPLASTY WITH STENT PLACEMENT  07/13/2008, 08/19/2008  . ESOPHAGOGASTRODUODENOSCOPY (EGD) WITH PROPOFOL N/A 06/18/2019   Procedure: ESOPHAGOGASTRODUODENOSCOPY (EGD) WITH PROPOFOL;  Surgeon: Robert Bellow, MD;  Location: [facility] ENDOSCOPY;  Service: Endoscopy;  Laterality: N/A;  . HIP ARTHROPLASTY N/A 2000  . JOINT REPLACEMENT    . LEFT HEART CATH AND CORONARY ANGIOGRAPHY N/A 05/16/2017   Procedure: LEFT HEART CATH AND CORONARY ANGIOGRAPHY;  Surgeon: Teodoro Spray, MD;  Location: [facility];  Service: Cardiovascular;  Laterality: N/A;  . TOTAL KNEE ARTHROPLASTY Left 09/2007, 01/2008    Allergies Etodolac, Sulfa antibiotics, and Tetracycline   Review of  Systems Constitutional: Negative for fever. Cardiovascular: Negative for chest pain. Respiratory: Positive for shortness of breath Gastrointestinal: Negative for abdominal pain, vomiting and diarrhea. Musculoskeletal: Negative for back pain. Skin: Negative for rash. Neurological: Negative for headaches, focal weakness or numbness.  All systems negative/normal/unremarkable except as stated in the HPI  ____________________________________________   PHYSICAL EXAM:  VITAL SIGNS: ED Triage Vitals  Enc Vitals Group     BP      Pulse      Resp      Temp      Temp src      SpO2      Weight      Height      Head Circumference      Peak Flow      Pain Score      Pain Loc      Pain Edu?      Excl. in [place]?    Constitutional: Alert and oriented.  Chronically ill-appearing, no distress Eyes: Conjunctivae are normal. Normal extraocular movements. Cardiovascular: Normal rate, regular rhythm. No murmurs, rubs, or gallops. Respiratory: Tachypnea with diminished breath sounds Gastrointestinal: Soft and nontender. Normal bowel sounds Musculoskeletal: Nontender with normal range of motion in extremities. No lower extremity tenderness nor edema. Neurologic:  Normal speech and language. No gross focal neurologic deficits are appreciated.  Skin:  Skin is warm, dry and intact. No rash noted. Psychiatric: Speech and behavior are normal.  ____________________________________________  EKG: Interpreted by me.  Sinus rhythm with rate of 82 bpm, LVH with repolarization abnormality, normal QT  ____________________________________________   LABS (pertinent positives/negatives)  Labs  Reviewed  COMPREHENSIVE METABOLIC PANEL - Abnormal; Notable for the following components:      Result Value   Calcium 8.0 (*)    Albumin 2.5 (*)    All other components within normal limits  CBC WITH DIFFERENTIAL/PLATELET - Abnormal; Notable for the following components:   RBC 3.21 (*)    Hemoglobin 9.7 (*)     HCT 31.4 (*)    RDW 16.6 (*)    Neutro Abs 8.5 (*)    All other components within normal limits  BRAIN NATRIURETIC PEPTIDE - Abnormal; Notable for the following components:   B Natriuretic Peptide 3,486.0 (*)    All other components within normal limits  CULTURE, BLOOD (ROUTINE X 2)  CULTURE, BLOOD (ROUTINE X 2)  URINE CULTURE  SARS CORONAVIRUS 2 BY RT PCR (HOSPITAL ORDER, [facility])  LACTIC ACID, PLASMA  PROTIME-INR  URINALYSIS, ROUTINE W REFLEX MICROSCOPIC   CRITICAL CARE Performed by: Laurence Aly   Total critical care time: 30 minutes  Critical care time was exclusive of separately billable procedures and treating other patients.  Critical care was necessary to treat or prevent imminent or life-threatening deterioration.  Critical care was time spent personally by me on the following activities: development of treatment plan with patient and/or surrogate as well as nursing, discussions with consultants, evaluation of patient's response to treatment, examination of patient, obtaining history from patient or surrogate, ordering and performing treatments and interventions, ordering and review of laboratory studies, ordering and review of radiographic studies, pulse oximetry and re-evaluation of patient's condition.  Radiology: CXR IMPRESSION: Patchy bilateral airspace disease could reflect asymmetric edema or infection.  Small left effusion.  Differential diagnosis: CHF, COPD, pneumonia, COVID-19, PE, pleural effusion  ASSESSMENT AND PLAN  Dyspnea, congestive heart failure   Plan: The patient had presented for dyspnea. Patient's labs mostly resembled a CHF exacerbation.  Patient's x-ray revealed patchy bilateral airspace disease possibly resembling edema versus infection.  We have ordered broad-spectrum antibiotics.  I will also order IV Lasix.  Patient at this point does not want to be admitted.  We will reassess after  Lasix.  Lenise Arena MD    Note: This note was generated in part or whole with voice recognition software. Voice recognition is usually quite accurate but there are transcription errors that can and very often do occur. I apologize for any typographical errors that were not detected and corrected.     Earleen Newport, MD 11/28/19 1529    Earleen Newport, MD 11/28/19 1529    Earleen Newport, MD 11/28/19 1530

## 2019-11-29 DIAGNOSIS — E876 Hypokalemia: Secondary | ICD-10-CM

## 2019-11-29 LAB — BASIC METABOLIC PANEL
Anion gap: 10 (ref 5–15)
BUN: 20 mg/dL (ref 8–23)
CO2: 24 mmol/L (ref 22–32)
Calcium: 7.6 mg/dL — ABNORMAL LOW (ref 8.9–10.3)
Chloride: 104 mmol/L (ref 98–111)
Creatinine, Ser: 0.98 mg/dL (ref 0.44–1.00)
GFR calc Af Amer: >60 mL/min (ref >60)
GFR calc non Af Amer: 58 mL/min — ABNORMAL LOW (ref >60)
Glucose, Bld: 86 mg/dL (ref 70–99)
Potassium: 3.1 mmol/L — ABNORMAL LOW (ref 3.5–5.1)
Sodium: 138 mmol/L (ref 135–145)

## 2019-11-29 LAB — MRSA PCR SCREENING: MRSA by PCR: NEGATIVE

## 2019-11-29 LAB — GLUCOSE, CAPILLARY
Glucose-Capillary: 74 mg/dL (ref 70–99)
Glucose-Capillary: 131 mg/dL — ABNORMAL HIGH (ref 70–99)

## 2019-11-29 MED ORDER — CHLORHEXIDINE GLUCONATE CLOTH 2 % EX PADS
6.0000 | MEDICATED_PAD | Freq: Every day | CUTANEOUS | Status: DC
  Administered 2019-11-29 – 2019-11-30 (×2): 6 via TOPICAL

## 2019-11-29 MED ORDER — BISACODYL 5 MG PO TBEC
5.0000 mg | DELAYED_RELEASE_TABLET | Freq: Once | ORAL | Status: AC
  Administered 2019-11-29: 5 mg via ORAL

## 2019-11-29 MED ORDER — DOCUSATE SODIUM 100 MG PO CAPS
100.0000 mg | ORAL_CAPSULE | Freq: Every day | ORAL | Status: DC
  Administered 2019-11-29 – 2019-12-05 (×5): 100 mg via ORAL
  Filled 2019-11-29 (×7): qty 1

## 2019-11-29 MED ORDER — FUROSEMIDE 10 MG/ML IJ SOLN
40.0000 mg | Freq: Once | INTRAMUSCULAR | Status: AC
  Administered 2019-11-29: 40 mg via INTRAVENOUS
  Filled 2019-11-29: qty 4

## 2019-11-29 MED ORDER — FUROSEMIDE 10 MG/ML IJ SOLN
40.0000 mg | Freq: Two times a day (BID) | INTRAMUSCULAR | Status: DC
  Administered 2019-11-30: 40 mg via INTRAVENOUS
  Filled 2019-11-29: qty 4

## 2019-11-29 MED ORDER — DOBUTAMINE IN D5W 4-5 MG/ML-% IV SOLN
2.5000 ug/kg/min | INTRAVENOUS | Status: DC
  Filled 2019-11-29: qty 250

## 2019-11-29 NOTE — Progress Notes (Signed)
[facility] Cardiology    SUBJECTIVE: Patient states she feels reasonably well she slept a lot earlier today and skipped breakfast and lunch but now she feels reasonably well.  Denies shortness of breath no cough no peripheral edema.  Patient denies any chest pain palpitations or tachycardia currently.  No significant fever chills or sweats.   Vitals:   11/29/19 0431 11/29/19 0433 11/29/19 0735 11/29/19 1133  BP: 101/60 101/60 (!) 89/51 (!) 93/53  Pulse: 64  64 65  Resp:   17 17  Temp:   97.7 F (36.5 C) 97.6 F (36.4 C)  TempSrc:   Oral   SpO2:   98% 99%  Weight:      Height:         Intake/Output Summary (Last 24 hours) at 11/29/2019 1542 Last data filed at 11/29/2019 1133 Gross per 24 hour  Intake 240 ml  Output 800 ml  Net -560 ml      PHYSICAL EXAM  General: Well developed, well nourished, in no acute distress HEENT:  Normocephalic and atramatic Neck:  No JVD.  Lungs: Clear bilaterally to auscultation and percussion. Heart: HRRR . Normal S1 and S2 without gallops or murmurs.  Abdomen: Bowel sounds are positive, abdomen soft and non-tender  Msk:  Back normal, normal gait. Normal strength and tone for age. Extremities: No clubbing, cyanosis or edema.   Neuro: Alert and oriented X 3. Psych:  Good affect, responds appropriately   LABS: Basic Metabolic Panel: Recent Labs    11/28/19 1400 11/29/19 0500  NA 139 138  K 3.6 3.1*  CL 108 104  CO2 22 24  GLUCOSE 95 86  BUN 19 20  CREATININE 0.82 0.98  CALCIUM 8.0* 7.6*   Liver Function Tests: Recent Labs    11/28/19 1400  AST 16  ALT 10  ALKPHOS 75  BILITOT 1.0  PROT 6.5  ALBUMIN 2.5*   No results for input(s): LIPASE, AMYLASE in the last 72 hours. CBC: Recent Labs    11/28/19 1400  WBC 10.2  NEUTROABS 8.5*  HGB 9.7*  HCT 31.4*  MCV 97.8  PLT 250   Cardiac Enzymes: No results for input(s): CKTOTAL, CKMB, CKMBINDEX, TROPONINI in the last 72 hours. BNP: Invalid input(s): POCBNP D-Dimer: No results  for input(s): DDIMER in the last 72 hours. Hemoglobin A1C: No results for input(s): HGBA1C in the last 72 hours. Fasting Lipid Panel: No results for input(s): CHOL, HDL, LDLCALC, TRIG, CHOLHDL, LDLDIRECT in the last 72 hours. Thyroid Function Tests: No results for input(s): TSH, T4TOTAL, T3FREE, THYROIDAB in the last 72 hours.  Invalid input(s): FREET3 Anemia Panel: No results for input(s): VITAMINB12, FOLATE, FERRITIN, TIBC, IRON, RETICCTPCT in the last 72 hours.  DG Chest Port 1 View  Result Date: 11/28/2019 CLINICAL DATA:  Shortness of breath EXAM: PORTABLE CHEST 1 VIEW COMPARISON:  10/02/2019 FINDINGS: Patchy bilateral airspace disease again noted, improved in the right upper lobe but increasing in the right lower lobe and left perihilar region. Heart is mildly enlarged. Small left pleural effusion. No visible right effusion. No acute bony abnormality. IMPRESSION: Patchy bilateral airspace disease could reflect asymmetric edema or infection. Small left effusion. Electronically Signed   By: Rolm Baptise M.D.   On: 11/28/2019 13:35     Echo previous echocardiogram with severely depressed left ventricular function globally EF of around 20 to 25%  TELEMETRY: Sinus rhythm rate of 70  ASSESSMENT AND PLAN:  Active Problems:   PAF (paroxysmal atrial fibrillation) (HCC)   CHF (congestive  heart failure) (HCC) Hypokalemia Generalized weakness Mild hypotension Chronic systolic congestive heart failure Paroxysmal atrial fibrillation Rheumatoid arthritis Nonischemic cardiomyopathy  Plan Patient is usually being transferred to stepdown unit to start dobutamine Blood pressure currently is [id number] systolic Patient states that she is not short of breath and there is no peripheral edema I recommend conservative therapy and in fact would reduce IV Lasix Patient may benefit from ACE inhibitor or Entresto if her blood pressure can tolerate it in the future Endpoint for discontinuing dobutamine  is unclear Continue therapy for rheumatoid arthritis Maintain therapy for possible pneumonia Clinically the patient does not seem very different than when I saw her earlier this morning at around 7 AM.  Will probably reduce Lasix to once a day instead of twice a day and base heart failure therapy on symptoms       Yolonda Kida, MD 11/29/2019 3:42 PM

## 2019-11-29 NOTE — Progress Notes (Signed)
PROGRESS NOTE    Tina Salinas  [id number] DOB: 03-25-1946 DOA: 11/28/2019 PCP: Birdie Sons, MD    Brief Narrative:  Tina Salinas is a 74 y.o. 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 fib on amiodarone, not on anticoagulation due to fall risk, RA on methotrexate and chronic prednisone who presents to the emergency room with a couple of days of not feeling well and today with sob and wheezing.  Denies chest pain, fever, chills.  She was found with increased respiratory effort with O2 sats ranging from 88 to 94% and patient was placed on 2 L nasal cannula had comfort in the ER. In the ED he was found with a BNP of 3400    Consultants:   Cardiology  Procedures:   Antimicrobials:   Azithromycin ceftriaxone   Subjective: Patient mildly lethargic this morning, reports just not feeling well. Looks weak  Objective: Vitals:   11/29/19 0431 11/29/19 0433 11/29/19 0735 11/29/19 1133  BP: 101/60 101/60 (!) 89/51 (!) 93/53  Pulse: 64  64 65  Resp:   17 17  Temp:   97.7 F (36.5 C) 97.6 F (36.4 C)  TempSrc:   Oral   SpO2:   98% 99%  Weight:      Height:        Intake/Output Summary (Last 24 hours) at 11/29/2019 1302 Last data filed at 11/29/2019 1133 Gross per 24 hour  Intake 240 ml  Output 800 ml  Net -560 ml   Filed Weights   11/28/19 1306 11/28/19 1852 11/29/19 0418  Weight: 54 kg 62.4 kg 63.2 kg    Examination:  General exam: mildly lethargic, looks weak  Respiratory system: +fine crackles scattered, no wheezing Cardiovascular system: S1 & S2 heard, RRR. No JVD, murmurs, rubs, gallops or clicks. . Gastrointestinal system: Abdomen is nondistended, soft and nontender.. Normal bowel sounds heard. Central nervous system: unable to assess  Extremities: no  edema Skin:warm dry Psychiatry: unable to assess    Data Reviewed: I have personally reviewed following labs and imaging studies  CBC: Recent Labs  Lab 11/28/19 1400  WBC 10.2  NEUTROABS 8.5*  HGB 9.7*  HCT 31.4*  MCV 97.8  PLT [id number]   Basic Metabolic Panel: Recent Labs  Lab 11/28/19 1400 11/29/19 0500  NA 139 138  K 3.6 3.1*  CL 108 104  CO2 22 24  GLUCOSE 95 86  BUN 19 20  CREATININE 0.82 0.98  CALCIUM 8.0* 7.6*   GFR: Estimated Creatinine Clearance: 44.2 mL/min (by C-G formula based on SCr of 0.98 mg/dL). Liver Function Tests: Recent Labs  Lab 11/28/19 1400  AST 16  ALT 10  ALKPHOS 75  BILITOT 1.0  PROT 6.5  ALBUMIN 2.5*   No results for input(s): LIPASE, AMYLASE in the last 168 hours. No results for input(s): AMMONIA in the last 168 hours. Coagulation Profile: Recent Labs  Lab 11/28/19 1400  INR 1.2   Cardiac Enzymes: No results for input(s): CKTOTAL, CKMB, CKMBINDEX, TROPONINI in the last 168 hours. BNP (last 3 results) No results for input(s): PROBNP in the last 8760 hours. HbA1C: No results for input(s): HGBA1C in the last 72 hours. CBG: No results for input(s): GLUCAP in the last 168 hours. Lipid Profile: No results for input(s): CHOL, HDL, LDLCALC, TRIG, CHOLHDL, LDLDIRECT in the last 72 hours. Thyroid  Function Tests: No results for input(s): TSH, T4TOTAL, FREET4, T3FREE, THYROIDAB in the last 72 hours. Anemia Panel: No results for input(s): VITAMINB12, FOLATE, FERRITIN, TIBC, IRON, RETICCTPCT in the last 72 hours. Sepsis Labs: Recent Labs  Lab 11/28/19 1400  LATICACIDVEN 1.2    Recent Results (from the past 240 hour(s))  Blood Culture (routine x 2)     Status: None (Preliminary result)   Collection Time: 11/28/19  2:00 PM   Specimen: BLOOD  Result Value Ref Range Status   Specimen Description BLOOD LEFT ANTECUBITAL  Final   Special Requests   Final    BOTTLES DRAWN AEROBIC AND ANAEROBIC Blood Culture results may not be optimal  due to an inadequate volume of blood received in culture bottles   Culture   Final    NO GROWTH < 24 HOURS Performed at [facility], [address], [place], [place] [address]    Report Status PENDING  Incomplete  Blood Culture (routine x 2)     Status: None (Preliminary result)   Collection Time: 11/28/19  2:00 PM   Specimen: BLOOD  Result Value Ref Range Status   Specimen Description BLOOD LEFT ANTECUBITAL  Final   Special Requests   Final    BOTTLES DRAWN AEROBIC AND ANAEROBIC Blood Culture results may not be optimal due to an inadequate volume of blood received in culture bottles   Culture   Final    NO GROWTH < 24 HOURS Performed at [facility], [address] [place], [place] [address]    Report Status PENDING  Incomplete  SARS Coronavirus 2 by RT PCR (hospital order, performed in [place] hospital lab) Nasopharyngeal Nasopharyngeal Swab     Status: None   Collection Time: 11/28/19  2:01 PM   Specimen: Nasopharyngeal Swab  Result Value Ref Range Status   SARS Coronavirus 2 NEGATIVE NEGATIVE Final    Comment: (NOTE) SARS-CoV-2 target nucleic acids are NOT DETECTED. The SARS-CoV-2 RNA is generally detectable in upper and lower respiratory specimens during the acute phase of infection. The lowest concentration of SARS-CoV-2 viral copies this assay can detect is 250 copies / mL. A negative result does not preclude SARS-CoV-2 infection and should not be used as the sole basis for treatment or other patient management decisions.  A negative result may occur with improper specimen collection / handling, submission of specimen other than nasopharyngeal swab, presence of viral mutation(s) within the areas targeted by this assay, and inadequate number of viral copies (<250 copies / mL). A negative result must be combined with clinical observations, patient history, and epidemiological information. Fact Sheet for Patients:    StrictlyIdeas.no Fact Sheet for Healthcare Providers: BankingDealers.co.za This test is not yet approved or cleared  by the [place] FDA and has been authorized for detection and/or diagnosis of SARS-CoV-2 by FDA under an Emergency Use Authorization (EUA).  This EUA will remain in effect (meaning this test can be used) for the duration of the COVID-19 declaration under Section 564(b)(1) of the Act, 21 U.S.C. section 360bbb-3(b)(1), unless the authorization is terminated or revoked sooner. Performed at [facility], [address], [place], [place] [address]          Radiology Studies: DG Chest [place] 1 View  Result Date: 11/28/2019 CLINICAL DATA:  Shortness of breath EXAM: PORTABLE CHEST 1 VIEW COMPARISON:  10/02/2019 FINDINGS: Patchy bilateral airspace disease again noted, improved in the right upper lobe but increasing in the right lower lobe and left perihilar region.  Heart is mildly enlarged. Small left pleural effusion. No visible right effusion. No acute bony abnormality. IMPRESSION: Patchy bilateral airspace disease could reflect asymmetric edema or infection. Small left effusion. Electronically Signed   By: Rolm Baptise M.D.   On: 11/28/2019 13:35        Scheduled Meds: . amiodarone  200 mg Oral Daily  . aspirin EC  81 mg Oral Daily  . calcium carbonate  500 mg Oral Daily  . cholecalciferol  1,000 Units Oral Daily  . docusate sodium  100 mg Oral Daily  . enoxaparin (LOVENOX) injection  40 mg Subcutaneous Q24H  . folic acid  1 mg Oral Daily  . furosemide  20 mg Intravenous BID  . hydroxychloroquine  400 mg Oral Daily  . latanoprost  1 drop Both Eyes QHS  . mirtazapine  30 mg Oral QHS  . pravastatin  40 mg Oral QHS  . predniSONE  2.5 mg Oral Q breakfast  . sodium chloride flush  3 mL Intravenous Q12H  . [START ON 11/30/2019] Vitamin D (Ergocalciferol)  50,000 Units Oral Q7 days   Continuous Infusions: .  sodium chloride    . azithromycin Stopped (11/28/19 1526)  . cefTRIAXone (ROCEPHIN)  IV Stopped (11/28/19 1652)  . DOBUTamine      Assessment & Plan:   Active Problems:   CHF (congestive heart failure) (HCC)   Acute on chronic combined CHF: w/ non-ischemic cardiomyopathy. Last EF on was 20 to 25%. BNP very elevated Given lasix 40mg  iv x1 in ER, BP low now, can start lasix 20mg  iv bid in am, but if bp low may need ionotropes.Marland KitchenMarland KitchenDiscussed my concern with cardiology Dr. Jerrye Beavers. Will transfer to SDU for inotropes and close monitoring   ?PNA- cxr with edema v.s. pna. Recently was admitted for pna/chf. Likely Sob from chf, but empirically continuing abx for now Will continue iv abx started in ED. F/u procalcitonin  Hypokalemia-due to diuresis Will replace and monitor   rheumatoid arthritis: continue plaquenil and prednisone home dose  PAF: continue amiodarone. Not currently on systemic anticoagulation due to fall risk.  Pt's cardiologist is Dr. Ubaldo Glassing  CKD stage IIIa: at baseline. Will continue to monitor   Vitamin D deficiency: continue on vitamin D 50,000 units weekly  DL- on statin, will continue  PT/OT  DVT prophylaxis: lovenox  Code Status: full  Family Communication: friend at bedside  Disposition Plan: back to home environment  Barrier: pt needing Inotrope gtt and diuresis iv as very vol overloaded,not medically stable for discharge yet        LOS: 1 day   Time spent: 45 minutes with more than 50% on [place], MD [facility] Pager [phone]  If 7PM-7AM, please contact night-coverage [email or web address] Password [email or web address] 11/29/2019, 1:02 PM

## 2019-11-29 NOTE — Progress Notes (Signed)
[facility] Cardiology    SUBJECTIVE: Currently sleeping resting comfortably no respiratory distress no significant cough congestion or pain   Vitals:   11/29/19 0418 11/29/19 0425 11/29/19 0431 11/29/19 0433  BP: (!) 86/74 (!) 89/49 101/60 101/60  Pulse: 64 63 64   Resp: 20     Temp: 97.8 F (36.6 C)     TempSrc: Oral     SpO2: 97%     Weight: 63.2 kg     Height:         Intake/Output Summary (Last 24 hours) at 11/29/2019 [id number] Last data filed at 11/28/2019 2327 Gross per 24 hour  Intake 240 ml  Output 800 ml  Net -560 ml      PHYSICAL EXAM  General: Well developed, well nourished, in no acute distress HEENT:  Normocephalic and atramatic Neck:  No JVD.  Lungs: Clear bilaterally to auscultation and percussion. Heart: HRRR . Normal S1 and S2 without gallops or murmurs.  Abdomen: Bowel sounds are positive, abdomen soft and non-tender  Msk:  Back normal, normal gait. Normal strength and tone for age. Extremities: No clubbing, cyanosis or edema.   Neuro: Alert and oriented X 3. Psych:  Good affect, responds appropriately   LABS: Basic Metabolic Panel: Recent Labs    11/28/19 1400 11/29/19 0500  NA 139 138  K 3.6 3.1*  CL 108 104  CO2 22 24  GLUCOSE 95 86  BUN 19 20  CREATININE 0.82 0.98  CALCIUM 8.0* 7.6*   Liver Function Tests: Recent Labs    11/28/19 1400  AST 16  ALT 10  ALKPHOS 75  BILITOT 1.0  PROT 6.5  ALBUMIN 2.5*   No results for input(s): LIPASE, AMYLASE in the last 72 hours. CBC: Recent Labs    11/28/19 1400  WBC 10.2  NEUTROABS 8.5*  HGB 9.7*  HCT 31.4*  MCV 97.8  PLT 250   Cardiac Enzymes: No results for input(s): CKTOTAL, CKMB, CKMBINDEX, TROPONINI in the last 72 hours. BNP: Invalid input(s): POCBNP D-Dimer: No results for input(s): DDIMER in the last 72 hours. Hemoglobin A1C: No results for input(s): HGBA1C in the last 72 hours. Fasting Lipid Panel: No results for input(s): CHOL, HDL, LDLCALC, TRIG, CHOLHDL, LDLDIRECT in the  last 72 hours. Thyroid Function Tests: No results for input(s): TSH, T4TOTAL, T3FREE, THYROIDAB in the last 72 hours.  Invalid input(s): FREET3 Anemia Panel: No results for input(s): VITAMINB12, FOLATE, FERRITIN, TIBC, IRON, RETICCTPCT in the last 72 hours.  DG Chest Port 1 View  Result Date: 11/28/2019 CLINICAL DATA:  Shortness of breath EXAM: PORTABLE CHEST 1 VIEW COMPARISON:  10/02/2019 FINDINGS: Patchy bilateral airspace disease again noted, improved in the right upper lobe but increasing in the right lower lobe and left perihilar region. Heart is mildly enlarged. Small left pleural effusion. No visible right effusion. No acute bony abnormality. IMPRESSION: Patchy bilateral airspace disease could reflect asymmetric edema or infection. Small left effusion. Electronically Signed   By: Rolm Baptise M.D.   On: 11/28/2019 13:35     Echo previous echocardiogram with severely depressed left ventricular function between 20-25 %  TELEMETRY: Appears to be sinus rhythm rate controlled  ASSESSMENT AND PLAN:  Active Problems:   CHF (congestive heart failure) (HCC) Cardiomyopathy ejection fraction around 20 to 25% Elevated BNP Abnormal chest x-ray Rheumatoid arthritis Paroxysmal atrial fibrillation Chronic renal sufficiency Relative hypotension  . Plan  continue telemetry follow-up heart rate Continue amiodarone for rhythm and heart rate control Poor anticoagulation candidate continue conservative management  in that regard Follow-up renal sufficiency consider involving nephrology Continue therapy for rheumatoid arthritis Maintain diuretic therapy for heart failure Hold antihypertensives reduce diuretics because of hypotension Recommend conservative cardiac input at this point      Yolonda Kida, MD 11/29/2019 7:12 AM

## 2019-11-29 NOTE — Progress Notes (Addendum)
Physical Therapy Evaluation Patient Details Name: Tina Salinas MRN: [id number] DOB: 04-Jan-1946 Today's Date: 11/29/2019   History of Present Illness  Per MD note:Tina Salinas is a 74 y.o. female with medical history significant of Tina Salinas is a 74 y.o. 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 fib on amiodarone, not on anticoagulation due to fall risk, RA on methotrexate and chronic prednisone who presents to the emergency room with a couple of days of not feeling well and today with sob and wheezing.  Denies chest pain, fever, chills.  She was found with increased respiratory effort with O2 sats ranging from 88 to 94% and patient was placed on 2 L nasal cannula had comfort in the ER.  Clinical Impression  Patient agrees to PT evaluation. Patient is agitated and somewhat resistant to answering most questions about how she was taking care of herself at home. She needs a WC to enter her home as she has a ramp and is non-ambulatory due to profound weakness, but says she had a wc and returned it because it was uncomfortable and didn't use it. She has friends that help her at home but she did admit that she could use more help for ADL's. She has poor BLE strength, -2/5 BLE strength to hips and needs mod assist for supine to sit mobility and min assist for sit to supine mobility. She is unable to sit without support and is not able to hold her head up in sitting. She is not able to transfer or ambulate at this time due to profound weakness in BLE and trunk. Writer recommends SNF to patient due to poor mobility and weakness, and she became agitated and replied that she will be going home. Patient will continue to benefit from skilled PT to improve strength and mobility and safety.     Follow Up Recommendations SNF   Equipment Recommendations  (Pt declines needing WC, PT  recommends it)    Recommendations for Other Services       Precautions / Restrictions Restrictions Weight Bearing Restrictions: No      Mobility  Bed Mobility Overal bed mobility: Needs Assistance Bed Mobility: Supine to Sit;Sit to Supine     Supine to sit: Min assist Sit to supine: Min guard   General bed mobility comments: (Pt is resistant to assist from PT, needs min A)  Transfers Overall transfer level: (unable)                  Ambulation/Gait Ambulation/Gait assistance: (unable)              Stairs            Wheelchair Mobility    Modified Rankin (Stroke Patients Only)       Balance Overall balance assessment: Needs assistance Sitting-balance support: Bilateral upper extremity supported Sitting balance-Leahy Scale: Poor Sitting balance - Comments: (unable to hold head up, unable to support trunk-needs assist)                                     Pertinent Vitals/Pain Pain Assessment: No/denies pain    Home Living Family/patient expects to be discharged to:: Private residence Living Arrangements: Alone Available Help at Discharge: Friend(s) Type of Home: House Home Access: Ramped entrance     Home Layout: One  level Home Equipment: Walker - 2 wheels Additional Comments: (discussed needing a wc and patient declines it)    Prior Function Level of Independence: Needs assistance   Gait / Transfers Assistance Needed: (unable to ambulate x last 3 weeks)  ADL's / Homemaking Assistance Needed: (needs assist with ADL's)  Comments: (Pt has friends that help, needs more help)     Hand Dominance        Extremity/Trunk Assessment   Upper Extremity Assessment Upper Extremity Assessment: Defer to OT evaluation    Lower Extremity Assessment Lower Extremity Assessment: RLE deficits/detail;LLE deficits/detail RLE Deficits / Details: (RLE hip flex/abd 2/5, knee flex/ext 2/5) LLE Deficits / Details: (RLE hip flex/abd  2/5, knee flex/ext 2/5)       Communication   Communication: No difficulties  Cognition Arousal/Alertness: Awake/alert Behavior During Therapy: Agitated Overall Cognitive Status: Within Functional Limits for tasks assessed                                 General Comments: (Pt reports that a HHA would be helpful)      General Comments      Exercises     Assessment/Plan    PT Assessment Patient needs continued PT services  PT Problem List Decreased strength;Decreased range of motion;Decreased activity tolerance;Decreased balance;Decreased mobility;Decreased skin integrity       PT Treatment Interventions Therapeutic activities;Therapeutic exercise;Balance training    PT Goals (Current goals can be found in the Care Plan section)  Acute Rehab PT Goals Patient Stated Goal: to get stronger PT Goal Formulation: Patient unable to participate in goal setting Time For Goal Achievement: 12/13/19 Potential to Achieve Goals: Fair    Frequency Min 2X/week   Barriers to discharge Decreased caregiver support (Patient needs HHPT and aide for ADL's)    Co-evaluation               AM-PAC PT "6 Clicks" Mobility  Outcome Measure Help needed turning from your back to your side while in a flat bed without using bedrails?: A Little Help needed moving from lying on your back to sitting on the side of a flat bed without using bedrails?: A Lot Help needed moving to and from a bed to a chair (including a wheelchair)?: A Lot Help needed standing up from a chair using your arms (e.g., wheelchair or bedside chair)?: Total Help needed to walk in hospital room?: Total Help needed climbing 3-5 steps with a railing? : Total 6 Click Score: 10    End of Session Equipment Utilized During Treatment: Oxygen Activity Tolerance: Patient limited by fatigue;Patient limited by lethargy;Treatment limited secondary to agitation Patient left: in bed;with bed alarm set Nurse  Communication: Mobility status PT Visit Diagnosis: Muscle weakness (generalized) (M62.81);Difficulty in walking, not elsewhere classified (R26.2)    Time: [id number] PT Time Calculation (min) (ACUTE ONLY): 25 min   Charges:     PT Treatments $Therapeutic Activity: 8-22 mins          Alanson Puls, PT DPT 11/29/2019, 12:14 PM

## 2019-11-29 NOTE — Progress Notes (Signed)
OT Cancellation Note  Patient Details Name: ETHYL GAIN MRN: [id number] DOB: 1945-07-26   Cancelled Treatment:    Reason Eval/Treat Not Completed: Medical issues which prohibited therapy(Pt. is preparing to transfer to the ICU. Will need a new order for OT when appropriate.)  Harrel Carina, MS, OTR/L 11/29/2019, 2:25 PM

## 2019-11-29 NOTE — Plan of Care (Signed)
  Problem: Clinical Measurements: Goal: Respiratory complications will improve Outcome: Progressing   Problem: Safety: Goal: Ability to remain free from injury will improve Outcome: Progressing   

## 2019-11-30 DIAGNOSIS — L8990 Pressure ulcer of unspecified site, unspecified stage: Secondary | ICD-10-CM | POA: Insufficient documentation

## 2019-11-30 LAB — BASIC METABOLIC PANEL
Anion gap: 9 (ref 5–15)
BUN: 20 mg/dL (ref 8–23)
CO2: 25 mmol/L (ref 22–32)
Calcium: 7.7 mg/dL — ABNORMAL LOW (ref 8.9–10.3)
Chloride: 107 mmol/L (ref 98–111)
Creatinine, Ser: 0.91 mg/dL (ref 0.44–1.00)
GFR calc Af Amer: >60 mL/min (ref >60)
GFR calc non Af Amer: >60 mL/min (ref >60)
Glucose, Bld: 90 mg/dL (ref 70–99)
Potassium: 2.9 mmol/L — ABNORMAL LOW (ref 3.5–5.1)
Sodium: 141 mmol/L (ref 135–145)

## 2019-11-30 LAB — BRAIN NATRIURETIC PEPTIDE: B Natriuretic Peptide: 576 pg/mL — ABNORMAL HIGH (ref 0.0–100.0)

## 2019-11-30 LAB — MAGNESIUM: Magnesium: 2 mg/dL (ref 1.7–2.4)

## 2019-11-30 MED ORDER — POTASSIUM CHLORIDE 10 MEQ/100ML IV SOLN
10.0000 meq | INTRAVENOUS | Status: DC
  Administered 2019-11-30: 10 meq via INTRAVENOUS
  Filled 2019-11-30 (×4): qty 100

## 2019-11-30 MED ORDER — FUROSEMIDE 40 MG PO TABS
40.0000 mg | ORAL_TABLET | Freq: Two times a day (BID) | ORAL | Status: DC
  Administered 2019-11-30: 40 mg via ORAL
  Filled 2019-11-30 (×2): qty 1

## 2019-11-30 MED ORDER — POTASSIUM CHLORIDE CRYS ER 20 MEQ PO TBCR
40.0000 meq | EXTENDED_RELEASE_TABLET | Freq: Two times a day (BID) | ORAL | Status: DC
  Administered 2019-11-30 (×2): 40 meq via ORAL
  Filled 2019-11-30 (×3): qty 2

## 2019-11-30 MED ORDER — ENSURE ENLIVE PO LIQD
237.0000 mL | Freq: Two times a day (BID) | ORAL | Status: DC
  Administered 2019-11-30 (×2): 237 mL via ORAL

## 2019-11-30 MED ORDER — ADULT MULTIVITAMIN W/MINERALS CH
1.0000 | ORAL_TABLET | Freq: Every day | ORAL | Status: DC
  Administered 2019-11-30 – 2019-12-02 (×3): 1 via ORAL
  Filled 2019-11-30 (×2): qty 1

## 2019-11-30 MED ORDER — DOXYLAMINE SUCCINATE (SLEEP) 25 MG PO TABS
25.0000 mg | ORAL_TABLET | Freq: Every evening | ORAL | Status: DC | PRN
  Administered 2019-12-02: 25 mg via ORAL
  Filled 2019-11-30 (×4): qty 1

## 2019-11-30 NOTE — Progress Notes (Signed)
Initial Nutrition Assessment  DOCUMENTATION CODES:   Not applicable  INTERVENTION:    Ensure Enlive po BID, each supplement provides 350 kcal and 20 grams of protein.  Multivitamin with minerals daily.  NUTRITION DIAGNOSIS:   Increased nutrient needs related to chronic illness(CHF) as evidenced by estimated needs.  GOAL:   Patient will meet greater than or equal to 90% of their needs  MONITOR:   PO intake, Supplement acceptance, Labs, Weight trends  REASON FOR ASSESSMENT:   Malnutrition Screening Tool    ASSESSMENT:   74 yo female admitted with SOB and wheezing r/t CHF. PMH includes cerebral palsy, CKD 3, CAD, HF, PAF, RA, vitamin D deficiency.   Unable to speak with patient by phone, she is sleeping per unit secretary. Currently on a 2 gm sodium diet. Meal intakes not recorded.   Labs reviewed. K 2.9 (L) CBG's: 74-131  Medications reviewed and include TUMS, vitamin D3 daily, Colace, folic acid, Lasix, Remeron, KCl, prednisone, ergocalciferol every 7 days.  Patient reported weight loss and poor intake related to poor appetite on admission. Per review of usual weights below, patient has not lost any weight. Question accuracy of weights recorded. Weight could be up with fluid overload r/t CHF.  Wt Readings from Last 6 Encounters:  11/30/19 63 kg  10/22/19 36.9 kg  10/11/19 38.8 kg  08/25/19 43 kg  07/23/19 41.7 kg  06/18/19 40.8 kg    NUTRITION - FOCUSED PHYSICAL EXAM:  unable to complete  Diet Order:   Diet Order            Diet 2 gram sodium Room service appropriate? Yes; Fluid consistency: Thin  Diet effective now              EDUCATION NEEDS:   Not appropriate for education at this time  Skin:  Skin Assessment: Reviewed RN Assessment  Last BM:  5/12  Height:   Ht Readings from Last 1 Encounters:  11/28/19 5\' 1"  (1.549 m)    Weight:   Wt Readings from Last 1 Encounters:  11/30/19 63 kg    Ideal Body Weight:  47.7 kg  BMI:  Body  mass index is 26.24 kg/m.  Estimated Nutritional Needs:   Kcal:  1300-1500  Protein:  65-75 gm  Fluid:  1.5 L    Lucas Mallow, RD, LDN, CNSC Please refer to Amion for contact information.

## 2019-11-30 NOTE — Progress Notes (Signed)
[facility] Cardiology    SUBJECTIVE: States to be doing reasonably well complain of generalized weakness and fatigue she did not feel good so her girlfriend brought to the hospital she is doing better with reduced dyspnea shortness of breath and no significant leg edema denies chest pain   Vitals:   11/30/19 0800 11/30/19 1200 11/30/19 1426 11/30/19 2046  BP: (!) 98/54 110/68 (!) 112/51 (!) 92/59  Pulse:   63 77  Resp:   15 16  Temp: 98.1 F (36.7 C) 98.2 F (36.8 C) 97.7 F (36.5 C) 98 F (36.7 C)  TempSrc: Oral Oral Oral   SpO2: 99% 98% 100% 100%  Weight:      Height:         Intake/Output Summary (Last 24 hours) at 11/30/2019 2220 Last data filed at 11/30/2019 1700 Gross per 24 hour  Intake 952.5 ml  Output 800 ml  Net 152.5 ml      PHYSICAL EXAM  General: Well developed, well nourished, in no acute distress HEENT:  Normocephalic and atramatic Neck:  No JVD.  Lungs: Clear bilaterally to auscultation and percussion. Heart: HRRR . Normal S1 and S2 without gallops or murmurs.  Abdomen: Bowel sounds are positive, abdomen soft and non-tender  Msk:  Back normal, normal gait. Normal strength and tone for age. Extremities: No clubbing, cyanosis or edema.   Neuro: Alert and oriented X 3. Psych:  Good affect, responds appropriately   LABS: Basic Metabolic Panel: Recent Labs    11/29/19 0500 11/30/19 0736  NA 138 141  K 3.1* 2.9*  CL 104 107  CO2 24 25  GLUCOSE 86 90  BUN 20 20  CREATININE 0.98 0.91  CALCIUM 7.6* 7.7*  MG  --  2.0   Liver Function Tests: Recent Labs    11/28/19 1400  AST 16  ALT 10  ALKPHOS 75  BILITOT 1.0  PROT 6.5  ALBUMIN 2.5*   No results for input(s): LIPASE, AMYLASE in the last 72 hours. CBC: Recent Labs    11/28/19 1400  WBC 10.2  NEUTROABS 8.5*  HGB 9.7*  HCT 31.4*  MCV 97.8  PLT 250   Cardiac Enzymes: No results for input(s): CKTOTAL, CKMB, CKMBINDEX, TROPONINI in the last 72 hours. BNP: Invalid input(s): POCBNP  D-Dimer: No results for input(s): DDIMER in the last 72 hours. Hemoglobin A1C: No results for input(s): HGBA1C in the last 72 hours. Fasting Lipid Panel: No results for input(s): CHOL, HDL, LDLCALC, TRIG, CHOLHDL, LDLDIRECT in the last 72 hours. Thyroid Function Tests: No results for input(s): TSH, T4TOTAL, T3FREE, THYROIDAB in the last 72 hours.  Invalid input(s): FREET3 Anemia Panel: No results for input(s): VITAMINB12, FOLATE, FERRITIN, TIBC, IRON, RETICCTPCT in the last 72 hours.  No results found.   Echo depressed left ventricular function ejection fraction less than 25% globally depressed  TELEMETRY: Normal sinus rhythm around 70  ASSESSMENT AND PLAN:  Active Problems:   PAF (paroxysmal atrial fibrillation) (HCC)   CHF (congestive heart failure) (HCC)   Pressure injury of skin Rheumatoid arthritis Dyspnea Leg edema Generalized weakness Cardiomyopathy severe ejection fraction of less than 25% Continue antibiotic therapy for possible pneumonia Hypokalemia will correct electrolytes . Plan Agree with supplemental oxygen as necessary Continue diuretic therapy for heart failure reduced dose as patient is slightly hypotensive BNP is improved Inhalers will be helpful for respiratory support Adjust medications for heart failure and cardiomyopathy consider Entresto ACE or ARB when blood pressure allow Continue rate control for atrial fibrillation maintained on  amiodarone not on anticoagulation poor candidate because of fall risk Continue current cardiac care hopefully will consider discharge home soon    Yolonda Kida, MD 11/30/2019 10:20 PM

## 2019-11-30 NOTE — Progress Notes (Signed)
PROGRESS NOTE    Tina GEPPERT  [id number] DOB: [birth year] DOA: 11/28/2019 PCP: Birdie Sons, MD    Brief Narrative:  Tina Salinas is a 74 y.o. 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 fib on amiodarone, not on anticoagulation due to fall risk, RA on methotrexate and chronic prednisone who presents to the emergency room with a couple of days of not feeling well and today with sob and wheezing.  Denies chest pain, fever, chills.  She was found with increased respiratory effort with O2 sats ranging from 88 to 94% and patient was placed on 2 L nasal cannula had comfort in the ER. In the ED he was found with a BNP of 3400    Consultants:   Cardiology  Procedures:   Antimicrobials:   Azithromycin ceftriaxone   Subjective: Pt more communicative and interactive today. More awake and alert. She states she feels better.per nsg, pt told them she will not take lasix at home b/c makes her urinate. Also stated "will have friend bring her pizza to the hospital".  Objective: Vitals:   11/30/19 0441 11/30/19 0500 11/30/19 0600 11/30/19 0800  BP:  (!) 106/54 (!) 99/53 (!) 98/54  Pulse:  65 65   Resp:  (!) 22 20   Temp:    98.1 F (36.7 C)  TempSrc:    Oral  SpO2:  99% 98% 99%  Weight: 63 kg     Height:        Intake/Output Summary (Last 24 hours) at 11/30/2019 1144 Last data filed at 11/30/2019 0300 Gross per 24 hour  Intake 712.5 ml  Output 800 ml  Net -87.5 ml   Filed Weights   11/28/19 1852 11/29/19 0418 11/30/19 0441  Weight: 62.4 kg 63.2 kg 63 kg    Examination:  General exam: appears better, more interactive and awake Respiratory system: more cta b/l, no wheezing Cardiovascular system: S1 & S2 heard, RRR. No JVD, murmurs, rubs, gallops or clicks. . Gastrointestinal  system: Abdomen is nondistended, soft and nontender.. Normal bowel sounds heard. Central nervous system: unable to assess  Extremities: no edema or cyanosis Skin:warm dry Psychiatry: mood appropriate for current setting       Data Reviewed: I have personally reviewed following labs and imaging studies  CBC: Recent Labs  Lab 11/28/19 1400  WBC 10.2  NEUTROABS 8.5*  HGB 9.7*  HCT 31.4*  MCV 97.8  PLT [id number]   Basic Metabolic Panel: Recent Labs  Lab 11/28/19 1400 11/29/19 0500 11/30/19 0736  NA 139 138 141  K 3.6 3.1* 2.9*  CL 108 104 107  CO2 22 24 25   GLUCOSE 95 86 90  BUN 19 20 20   CREATININE 0.82 0.98 0.91  CALCIUM 8.0* 7.6* 7.7*  MG  --   --  2.0   GFR: Estimated Creatinine Clearance: 47.5 mL/min (by C-G formula based on SCr of 0.91 mg/dL). Liver Function Tests: Recent Labs  Lab 11/28/19 1400  AST 16  ALT 10  ALKPHOS 75  BILITOT 1.0  PROT 6.5  ALBUMIN 2.5*   No results for input(s): LIPASE, AMYLASE in the last 168 hours. No results for input(s): AMMONIA in the last 168 hours. Coagulation Profile: Recent Labs  Lab 11/28/19 1400  INR 1.2   Cardiac Enzymes: No results for input(s): CKTOTAL, CKMB, CKMBINDEX, TROPONINI in the last  168 hours. BNP (last 3 results) No results for input(s): PROBNP in the last 8760 hours. HbA1C: No results for input(s): HGBA1C in the last 72 hours. CBG: Recent Labs  Lab 11/29/19 1534 11/29/19 1727  GLUCAP 74 131*   Lipid Profile: No results for input(s): CHOL, HDL, LDLCALC, TRIG, CHOLHDL, LDLDIRECT in the last 72 hours. Thyroid Function Tests: No results for input(s): TSH, T4TOTAL, FREET4, T3FREE, THYROIDAB in the last 72 hours. Anemia Panel: No results for input(s): VITAMINB12, FOLATE, FERRITIN, TIBC, IRON, RETICCTPCT in the last 72 hours. Sepsis Labs: Recent Labs  Lab 11/28/19 1400  LATICACIDVEN 1.2    Recent Results (from the past 240 hour(s))  Blood Culture (routine x 2)     Status: None (Preliminary  result)   Collection Time: 11/28/19  2:00 PM   Specimen: BLOOD  Result Value Ref Range Status   Specimen Description BLOOD LEFT ANTECUBITAL  Final   Special Requests   Final    BOTTLES DRAWN AEROBIC AND ANAEROBIC Blood Culture results may not be optimal due to an inadequate volume of blood received in culture bottles   Culture   Final    NO GROWTH 2 DAYS Performed at [facility], [address] [place], [place] [address]    Report Status PENDING  Incomplete  Blood Culture (routine x 2)     Status: None (Preliminary result)   Collection Time: 11/28/19  2:00 PM   Specimen: BLOOD  Result Value Ref Range Status   Specimen Description BLOOD LEFT ANTECUBITAL  Final   Special Requests   Final    BOTTLES DRAWN AEROBIC AND ANAEROBIC Blood Culture results may not be optimal due to an inadequate volume of blood received in culture bottles   Culture   Final    NO GROWTH 2 DAYS Performed at [facility], [address] [place], [place] [address]    Report Status PENDING  Incomplete  SARS Coronavirus 2 by RT PCR (hospital order, performed in [place] hospital lab) Nasopharyngeal Nasopharyngeal Swab     Status: None   Collection Time: 11/28/19  2:01 PM   Specimen: Nasopharyngeal Swab  Result Value Ref Range Status   SARS Coronavirus 2 NEGATIVE NEGATIVE Final    Comment: (NOTE) SARS-CoV-2 target nucleic acids are NOT DETECTED. The SARS-CoV-2 RNA is generally detectable in upper and lower respiratory specimens during the acute phase of infection. The lowest concentration of SARS-CoV-2 viral copies this assay can detect is 250 copies / mL. A negative result does not preclude SARS-CoV-2 infection and should not be used as the sole basis for treatment or other patient management decisions.  A negative result may occur with improper specimen collection / handling, submission of specimen other than nasopharyngeal swab, presence of viral mutation(s) within the areas targeted  by this assay, and inadequate number of viral copies (<250 copies / mL). A negative result must be combined with clinical observations, patient history, and epidemiological information. Fact Sheet for Patients:   StrictlyIdeas.no Fact Sheet for Healthcare Providers: BankingDealers.co.za This test is not yet approved or cleared  by the [place] FDA and has been authorized for detection and/or diagnosis of SARS-CoV-2 by FDA under an Emergency Use Authorization (EUA).  This EUA will remain in effect (meaning this test can be used) for the duration of the COVID-19 declaration under Section 564(b)(1) of the Act, 21 U.S.C. section 360bbb-3(b)(1), unless the authorization is terminated or revoked sooner. Performed at [facility], [address] [place], [place] [address]  MRSA PCR Screening     Status: None   Collection Time: 11/29/19  4:34 PM   Specimen: Nasal Mucosa; Nasopharyngeal  Result Value Ref Range Status   MRSA by PCR NEGATIVE NEGATIVE Final    Comment:        The GeneXpert MRSA Assay (FDA approved for NASAL specimens only), is one component of a comprehensive MRSA colonization surveillance program. It is not intended to diagnose MRSA infection nor to guide or monitor treatment for MRSA infections. Performed at [facility], [address] [place], [place] [address]          Radiology Studies: DG Chest [place] 1 View  Result Date: 11/28/2019 CLINICAL DATA:  Shortness of breath EXAM: PORTABLE CHEST 1 VIEW COMPARISON:  10/02/2019 FINDINGS: Patchy bilateral airspace disease again noted, improved in the right upper lobe but increasing in the right lower lobe and left perihilar region. Heart is mildly enlarged. Small left pleural effusion. No visible right effusion. No acute bony abnormality. IMPRESSION: Patchy bilateral airspace disease could reflect asymmetric edema or infection. Small left effusion.  Electronically Signed   By: Rolm Baptise M.D.   On: 11/28/2019 13:35        Scheduled Meds: . amiodarone  200 mg Oral Daily  . aspirin EC  81 mg Oral Daily  . calcium carbonate  500 mg Oral Daily  . Chlorhexidine Gluconate Cloth  6 each Topical Daily  . cholecalciferol  1,000 Units Oral Daily  . docusate sodium  100 mg Oral Daily  . enoxaparin (LOVENOX) injection  40 mg Subcutaneous Q24H  . feeding supplement (ENSURE ENLIVE)  237 mL Oral BID BM  . folic acid  1 mg Oral Daily  . furosemide  40 mg Oral BID  . hydroxychloroquine  400 mg Oral Daily  . latanoprost  1 drop Both Eyes QHS  . mirtazapine  30 mg Oral QHS  . multivitamin with minerals  1 tablet Oral Daily  . potassium chloride  40 mEq Oral BID  . pravastatin  40 mg Oral QHS  . predniSONE  2.5 mg Oral Q breakfast  . sodium chloride flush  3 mL Intravenous Q12H  . Vitamin D (Ergocalciferol)  50,000 Units Oral Q7 days   Continuous Infusions: . sodium chloride    . azithromycin Stopped (11/29/19 1431)  . cefTRIAXone (ROCEPHIN)  IV Stopped (11/29/19 2229)    Assessment & Plan:   Active Problems:   PAF (paroxysmal atrial fibrillation) (HCC)   CHF (congestive heart failure) (HCC)   Acute on chronic combined CHF: w/ non-ischemic cardiomyopathy. Last EF on was 20 to 25%. BNP very elevated on presentation, now decreased. Clinically improved.  No need for doubtamine. bp holding. Will switch iv lasix to po Can transfer to progressive care cards following Counseled patient extensively about dietary and medical compliance.  I told her she is at high risk of readmission if she does not comply.  At this point I do think she may benefit from palliative care.  ?PNA- cxr with edema v.s. pna. Recently was admitted for pna/chf. Likely Sob from chf, but empirically continuing abx for now Will continue iv abx started in ED. F/u procalcitonin  Hypokalemia-due to diuresis Replace with po as she cant tolerate iv  rheumatoid  arthritis: continue plaquenil and prednisone home dose  PAF: continue amiodarone. Not currently on systemic anticoagulation due to fall risk.  Pt's cardiologist is Dr. Ubaldo Glassing  CKD stage IIIa: at baseline. Will continue to monitor   Vitamin D deficiency:  continue on vitamin D 50,000 units weekly  DL- on statin, will continue  PT/OT-home health  DVT prophylaxis: lovenox  Code Status: full  Family Communication: friend at bedside  Disposition Plan: back to home environment  Barrier: TBD, tranfer pt to progressive care, if clinically improved on po diuretics over night possible d/c in 1-2 days as she needs medical optimization as she is high risk for readmission.      LOS: 2 days   Time spent: 45 minutes with more than 50% on [place], MD [facility] Pager [phone]  If 7PM-7AM, please contact night-coverage [email or web address] Password [facility] 11/30/2019, 11:44 AM Patient ID: MYLISHA SANANTONIO, female   DOB: 1945/10/24, 74 y.o.   MRN: [id number]

## 2019-11-30 NOTE — Progress Notes (Signed)
Patient transferred via bed by this RN on 4LNC, and purewick in place.  Patient has IV abx infusing, patient is dry with new gown in place.  Patient agreeable to transfer, Charge RN aware. Patient has all personal belongings with her.

## 2019-11-30 NOTE — Progress Notes (Signed)
Patient transferred to 2a 260 from ICU via bed. Oriented to room, call bell in reach.

## 2019-12-01 ENCOUNTER — Encounter: Payer: Self-pay | Admitting: Internal Medicine

## 2019-12-01 ENCOUNTER — Inpatient Hospital Stay: Payer: Medicare HMO

## 2019-12-01 LAB — BASIC METABOLIC PANEL
Anion gap: 11 (ref 5–15)
BUN: 17 mg/dL (ref 8–23)
CO2: 24 mmol/L (ref 22–32)
Calcium: 7.8 mg/dL — ABNORMAL LOW (ref 8.9–10.3)
Chloride: 105 mmol/L (ref 98–111)
Creatinine, Ser: 0.94 mg/dL (ref 0.44–1.00)
GFR calc Af Amer: >60 mL/min (ref >60)
GFR calc non Af Amer: >60 mL/min (ref >60)
Glucose, Bld: 88 mg/dL (ref 70–99)
Potassium: 3.9 mmol/L (ref 3.5–5.1)
Sodium: 140 mmol/L (ref 135–145)

## 2019-12-01 MED ORDER — POTASSIUM CHLORIDE CRYS ER 20 MEQ PO TBCR
20.0000 meq | EXTENDED_RELEASE_TABLET | Freq: Every day | ORAL | Status: DC
  Administered 2019-12-01 – 2019-12-03 (×3): 20 meq via ORAL
  Filled 2019-12-01 (×2): qty 1

## 2019-12-01 MED ORDER — FUROSEMIDE 20 MG PO TABS
20.0000 mg | ORAL_TABLET | Freq: Two times a day (BID) | ORAL | Status: DC
  Administered 2019-12-02: 20 mg via ORAL
  Filled 2019-12-01: qty 1

## 2019-12-01 MED ORDER — METOPROLOL SUCCINATE ER 25 MG PO TB24
12.5000 mg | ORAL_TABLET | Freq: Every day | ORAL | Status: DC
  Administered 2019-12-01: 12.5 mg via ORAL
  Filled 2019-12-01 (×4): qty 1

## 2019-12-01 MED ORDER — HYDROCODONE-ACETAMINOPHEN 10-325 MG PO TABS
1.0000 | ORAL_TABLET | Freq: Four times a day (QID) | ORAL | Status: DC | PRN
  Administered 2019-12-01 – 2019-12-04 (×7): 1 via ORAL
  Filled 2019-12-01 (×7): qty 1

## 2019-12-01 MED ORDER — AMOXICILLIN-POT CLAVULANATE 875-125 MG PO TABS
1.0000 | ORAL_TABLET | Freq: Two times a day (BID) | ORAL | Status: AC
  Administered 2019-12-01 – 2019-12-04 (×8): 1 via ORAL
  Filled 2019-12-01 (×8): qty 1

## 2019-12-01 NOTE — Evaluation (Signed)
Occupational Therapy Evaluation Patient Details Name: Tina Salinas MRN: [id number] DOB: 08-Nov-1945 Today's Date: 12/01/2019    History of Present Illness Pt is a 74 y.o. 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 fib on amiodarone, not on anticoagulation due to fall risk, RA on methotrexate and chronic prednisone who presents to the emergency room with a couple of days of not feeling well and today with sob and wheezing.  Denies chest pain, fever, chills.  She was found with increased respiratory effort with O2 sats ranging from 88 to 94% and patient was placed on 2 L nasal cannula had comfort in the ER.   Clinical Impression   Pt was seen for OT evaluation this date. Pt reports being very limited over the past couple weeks and unable to get around her home and perform ADL tasks at baseline independence 2/2 weakness. Pt lives alone and has been relying on friends for assist. Pt expresses strong desire to be independent and endorses that she believes therapy is her best chance at recovery and maintaining as much of her independence as possible.  Currently pt demonstrates impairments as described below (See OT problem list) which functionally limit her ability to perform ADL/self-care tasks. Pt currently requires Mod-Max Assist for bed mobility in preparation for seated ADL and toilet transfers. Pt endorses fear of falling and fearful scooting closer to EOB prior to ADL transfer attempt. Pt ultimately declines to trial 2/2 weakness and fear she is not strong enough. Active listening and encouragement utilized. Pt eager to improve functional independence and acknowledges desire to participate in therapy to help regain what she has lost. Pt educated in positioning and hand placement to improve sitting balance and pt's ability to perform bed mobility with increased independence;  educated in energy conservation strategies including activity pacing and need for rest breaks to support breath recovery, pt very attentive throughout and verbalizes understanding. Pt would benefit from skilled OT to address noted impairments and functional limitations (see below for any additional details) in order to maximize safety and independence while minimizing falls risk and caregiver burden. Upon hospital discharge, recommend STR to maximize pt safety and return to PLOF. Pt expresses agreement in return to SNF for additional therapy in order to facilitate the safest eventual discharge home.    Follow Up Recommendations  SNF    Equipment Recommendations  Other (comment)(will likely benefit from w/c)    Recommendations for Other Services       Precautions / Restrictions Precautions Precautions: Fall Restrictions Weight Bearing Restrictions: No Other Position/Activity Restrictions: Pt has specific orthotic shoes she prefers to wear for any OOB mobility attempts - not here at the hospital      Mobility Bed Mobility Overal bed mobility: Needs Assistance Bed Mobility: Supine to Sit;Sit to Supine     Supine to sit: Mod assist Sit to supine: Max assist   General bed mobility comments: pt very motivated to do as much as she can for herself, but ultimately after trying requires mod-max assist for bed mobility  Transfers Overall transfer level: Needs assistance               General transfer comment: Pt willing to try to attempt standing, but fearful getting closer to EOB that she will fall. And ultimately feels she is too weak to attempt    Balance Overall balance assessment: Needs assistance Sitting-balance support: Bilateral upper  extremity supported Sitting balance-Leahy Scale: Fair Sitting balance - Comments: initially poor balance coming to sit EOB but with repositioning and time pt able to maintain static sitting balance with close SBA to CGA without LOB.                                    ADL either performed or assessed with clinical judgement   ADL Overall ADL's : Needs assistance/impaired Eating/Feeding: Set up;Bed level   Grooming: Set up;Bed level   Upper Body Bathing: Sitting;Minimal assistance;Moderate assistance   Lower Body Bathing: Sitting/lateral leans;Maximal assistance   Upper Body Dressing : Sitting;Minimal assistance;Moderate assistance   Lower Body Dressing: Sitting/lateral leans;Maximal assistance     Toilet Transfer Details (indicate cue type and reason): unable to attempt                 Vision Baseline Vision/History: Wears glasses Wears Glasses: At all times Patient Visual Report: No change from baseline       Perception     Praxis      Pertinent Vitals/Pain Pain Assessment: Faces Faces Pain Scale: Hurts a little bit Pain Location: L elbow - red and swollen, tender to touch (recent xray neg) Pain Descriptors / Indicators: Guarding;Sore;Sharp Pain Intervention(s): Limited activity within patient's tolerance;Monitored during session;Repositioned     Hand Dominance Left   Extremity/Trunk Assessment Upper Extremity Assessment Upper Extremity Assessment: Generalized weakness(grossly 3/5 bilat, L elbow painful at rest and with movement or touch)   Lower Extremity Assessment Lower Extremity Assessment: Generalized weakness;Defer to PT evaluation   Cervical / Trunk Assessment Cervical / Trunk Assessment: Kyphotic   Communication Communication Communication: No difficulties   Cognition Arousal/Alertness: Awake/alert Behavior During Therapy: WFL for tasks assessed/performed Overall Cognitive Status: Within Functional Limits for tasks assessed                                 General Comments: Pt alert and oriented, follows commands. Pt describes herself as very independent and determined to try and remain as independent as possible, she states she realizes now that she needs  the assist and increased therapy to support her goals   General Comments  on 4.5L O2 throughout, O2 sats >98%, HR mid 70's-mid 80's with bed mobility    Exercises Other Exercises Other Exercises: Pt educated in positioning and hand placement to improve sitting balance and pt's ability to perform bed mobility with increased independence; educated in energy conservation strategies including activity pacing and need for rest breaks to support breath recovery, pt very attentive throughout   Shoulder Instructions      Home Living Family/patient expects to be discharged to:: Private residence Living Arrangements: Alone Available Help at Discharge: Friend(s);Available PRN/intermittently Type of Home: House Home Access: Ramped entrance     Home Layout: One level               Home Equipment: Walker - 2 wheels          Prior Functioning/Environment Level of Independence: Needs assistance  Gait / Transfers Assistance Needed: pt reports being unable to ambulate or get out of bed in a couple weeks ADL's / Homemaking Assistance Needed: Pt endorses needing increased assist for ADL tasks but does not have the assist and insists she wants to be as independent as possible  OT Problem List: Decreased strength;Pain;Decreased activity tolerance;Decreased knowledge of use of DME or AE;Impaired balance (sitting and/or standing);Impaired UE functional use      OT Treatment/Interventions: Self-care/ADL training;Therapeutic exercise;Therapeutic activities;Energy conservation;DME and/or AE instruction;Patient/family education;Balance training    OT Goals(Current goals can be found in the care plan section) Acute Rehab OT Goals Patient Stated Goal: get stronger and stay as independent as possible OT Goal Formulation: With patient Time For Goal Achievement: 12/15/19 Potential to Achieve Goals: Good ADL Goals Pt Will Perform Grooming: with set-up;sitting;with supervision Pt Will  Transfer to Toilet: with mod assist;bedside commode;stand pivot transfer Additional ADL Goal #1: Pt will perform bed mobility in preparation for EOB ADL and repositioning for pressure prevention requiring Min A.  OT Frequency: Min 2X/week   Barriers to D/C: Decreased caregiver support          Co-evaluation              AM-PAC OT "6 Clicks" Daily Activity     Outcome Measure Help from another person eating meals?: None Help from another person taking care of personal grooming?: None Help from another person toileting, which includes using toliet, bedpan, or urinal?: A Lot Help from another person bathing (including washing, rinsing, drying)?: A Lot Help from another person to put on and taking off regular upper body clothing?: A Little Help from another person to put on and taking off regular lower body clothing?: A Lot 6 Click Score: 17   End of Session Equipment Utilized During Treatment: Gait belt  Activity Tolerance: Patient tolerated treatment well Patient left: in bed;with call bell/phone within reach;with bed alarm set  OT Visit Diagnosis: Other abnormalities of gait and mobility (R26.89);Muscle weakness (generalized) (M62.81);Pain Pain - Right/Left: Left Pain - part of body: Arm                Time: [id number] OT Time Calculation (min): 56 min Charges:  OT General Charges $OT Visit: 1 Visit OT Evaluation $OT Eval Moderate Complexity: 1 Mod OT Treatments $Therapeutic Activity: 38-52 mins  Jeni Salles, MPH, MS, OTR/L ascom [phone] 12/01/19, 1:34 PM

## 2019-12-01 NOTE — Progress Notes (Signed)
PROGRESS NOTE    Tina Salinas  [id number] DOB: 14-Jul-1946 DOA: 11/28/2019 PCP: Birdie Sons, MD    Brief Narrative:  Tina Salinas is a 74 y.o. 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 fib on amiodarone, not on anticoagulation due to fall risk, RA on methotrexate and chronic prednisone who presents to the emergency room with a couple of days of not feeling well and today with sob and wheezing.  Denies chest pain, fever, chills.  She was found with increased respiratory effort with O2 sats ranging from 88 to 94% and patient was placed on 2 L nasal cannula had comfort in the ER. In the ED he was found with a BNP of 3400    Consultants:   Cardiology  Procedures:   Antimicrobials:   Azithromycin ceftriaxone   Subjective: Reports feeling better. Sob at baseline. She is hoping she can get oxygen to go home with.  Objective: Vitals:   11/30/19 2046 12/01/19 0349 12/01/19 0721 12/01/19 1144  BP: (!) 92/59 113/70 (!) 98/54 121/79  Pulse: 77 73 67 77  Resp: 16 15 16 16   Temp: 98 F (36.7 C) 97.9 F (36.6 C) 97.8 F (36.6 C) 97.8 F (36.6 C)  TempSrc:  Oral  Oral  SpO2: 100% 100% 100% 97%  Weight:  40.9 kg    Height:        Intake/Output Summary (Last 24 hours) at 12/01/2019 1251 Last data filed at 12/01/2019 0721 Gross per 24 hour  Intake 240 ml  Output 50 ml  Net 190 ml   Filed Weights   11/29/19 0418 11/30/19 0441 12/01/19 0349  Weight: 63.2 kg 63 kg 40.9 kg    Examination:  General exam: Calm comfortable, NAD sitting up in bed Respiratory system: No crackles, more clear to auscultation.  No wheezing Cardiovascular system: S1 & S2 heard, RRR. No JVD, murmurs, rubs, gallops or clicks. . Gastrointestinal system: Abdomen is nondistended, soft and nontender.. Normal  bowel sounds heard. Central nervous system: Oriented x3 Extremities: no edema  Skin:warm dry Psychiatry: mood appropriate for current setting       Data Reviewed: I have personally reviewed following labs and imaging studies  CBC: Recent Labs  Lab 11/28/19 1400  WBC 10.2  NEUTROABS 8.5*  HGB 9.7*  HCT 31.4*  MCV 97.8  PLT [id number]   Basic Metabolic Panel: Recent Labs  Lab 11/28/19 1400 11/29/19 0500 11/30/19 0736 12/01/19 0538  NA 139 138 141 140  K 3.6 3.1* 2.9* 3.9  CL 108 104 107 105  CO2 22 24 25 24   GLUCOSE 95 86 90 88  BUN 19 20 20 17   CREATININE 0.82 0.98 0.91 0.94  CALCIUM 8.0* 7.6* 7.7* 7.8*  MG  --   --  2.0  --    GFR: Estimated Creatinine Clearance: 34.9 mL/min (by C-G formula based on SCr of 0.94 mg/dL). Liver Function Tests: Recent Labs  Lab 11/28/19 1400  AST 16  ALT 10  ALKPHOS 75  BILITOT 1.0  PROT 6.5  ALBUMIN 2.5*   No results for input(s): LIPASE, AMYLASE in the last 168 hours. No results for input(s): AMMONIA in the last 168 hours. Coagulation Profile: Recent Labs  Lab 11/28/19 1400  INR 1.2   Cardiac Enzymes: No results for input(s): CKTOTAL, CKMB, CKMBINDEX, TROPONINI in the last 168 hours.  BNP (last 3 results) No results for input(s): PROBNP in the last 8760 hours. HbA1C: No results for input(s): HGBA1C in the last 72 hours. CBG: Recent Labs  Lab 11/29/19 1534 11/29/19 1727  GLUCAP 74 131*   Lipid Profile: No results for input(s): CHOL, HDL, LDLCALC, TRIG, CHOLHDL, LDLDIRECT in the last 72 hours. Thyroid Function Tests: No results for input(s): TSH, T4TOTAL, FREET4, T3FREE, THYROIDAB in the last 72 hours. Anemia Panel: No results for input(s): VITAMINB12, FOLATE, FERRITIN, TIBC, IRON, RETICCTPCT in the last 72 hours. Sepsis Labs: Recent Labs  Lab 11/28/19 1400  LATICACIDVEN 1.2    Recent Results (from the past 240 hour(s))  Blood Culture (routine x 2)     Status: None (Preliminary result)   Collection Time:  11/28/19  2:00 PM   Specimen: BLOOD  Result Value Ref Range Status   Specimen Description BLOOD LEFT ANTECUBITAL  Final   Special Requests   Final    BOTTLES DRAWN AEROBIC AND ANAEROBIC Blood Culture results may not be optimal due to an inadequate volume of blood received in culture bottles   Culture   Final    NO GROWTH 3 DAYS Performed at [facility], [address], [place], [place] [address]    Report Status PENDING  Incomplete  Blood Culture (routine x 2)     Status: None (Preliminary result)   Collection Time: 11/28/19  2:00 PM   Specimen: BLOOD  Result Value Ref Range Status   Specimen Description BLOOD LEFT ANTECUBITAL  Final   Special Requests   Final    BOTTLES DRAWN AEROBIC AND ANAEROBIC Blood Culture results may not be optimal due to an inadequate volume of blood received in culture bottles   Culture   Final    NO GROWTH 3 DAYS Performed at [facility], [address], [place], [place] [address]    Report Status PENDING  Incomplete  SARS Coronavirus 2 by RT PCR (hospital order, performed in [facility] hospital lab) Nasopharyngeal Nasopharyngeal Swab     Status: None   Collection Time: 11/28/19  2:01 PM   Specimen: Nasopharyngeal Swab  Result Value Ref Range Status   SARS Coronavirus 2 NEGATIVE NEGATIVE Final    Comment: (NOTE) SARS-CoV-2 target nucleic acids are NOT DETECTED. The SARS-CoV-2 RNA is generally detectable in upper and lower respiratory specimens during the acute phase of infection. The lowest concentration of SARS-CoV-2 viral copies this assay can detect is 250 copies / mL. A negative result does not preclude SARS-CoV-2 infection and should not be used as the sole basis for treatment or other patient management decisions.  A negative result may occur with improper specimen collection / handling, submission of specimen other than nasopharyngeal swab, presence of viral mutation(s) within the areas targeted by this assay, and  inadequate number of viral copies (<250 copies / mL). A negative result must be combined with clinical observations, patient history, and epidemiological information. Fact Sheet for Patients:   StrictlyIdeas.no Fact Sheet for Healthcare Providers: BankingDealers.co.za This test is not yet approved or cleared  by the [place] FDA and has been authorized for detection and/or diagnosis of SARS-CoV-2 by FDA under an Emergency Use Authorization (EUA).  This EUA will remain in effect (meaning this test can be used) for the duration of the COVID-19 declaration under Section 564(b)(1) of the Act, 21 U.S.C. section 360bbb-3(b)(1), unless the authorization is terminated or revoked sooner. Performed at [facility], [address] [place],  [address]  MRSA PCR Screening     Status: None   Collection Time: 11/29/19  4:34 PM   Specimen: Nasal Mucosa; Nasopharyngeal  Result Value Ref Range Status   MRSA by PCR NEGATIVE NEGATIVE Final    Comment:        The GeneXpert MRSA Assay (FDA approved for NASAL specimens only), is one component of a comprehensive MRSA colonization surveillance program. It is not intended to diagnose MRSA infection nor to guide or monitor treatment for MRSA infections. Performed at [facility], [address], [place], [place] [address]          Radiology Studies: DG Elbow 2 Views Left  Result Date: 12/01/2019 CLINICAL DATA:  Elbow swelling. EXAM: LEFT ELBOW - 2 VIEW COMPARISON:  None. FINDINGS: There is no evidence of fracture, dislocation, or joint effusion. There is no evidence of arthropathy or other focal bone abnormality. Soft tissues are unremarkable. IMPRESSION: Negative. Electronically Signed   By: Ulyses Jarred M.D.   On: 12/01/2019 03:33        Scheduled Meds: . amiodarone  200 mg Oral Daily  . amoxicillin-clavulanate  1 tablet Oral Q12H  . aspirin EC  81 mg Oral  Daily  . calcium carbonate  500 mg Oral Daily  . cholecalciferol  1,000 Units Oral Daily  . docusate sodium  100 mg Oral Daily  . enoxaparin (LOVENOX) injection  40 mg Subcutaneous Q24H  . feeding supplement (ENSURE ENLIVE)  237 mL Oral BID BM  . folic acid  1 mg Oral Daily  . furosemide  20 mg Oral BID  . hydroxychloroquine  400 mg Oral Daily  . latanoprost  1 drop Both Eyes QHS  . mirtazapine  30 mg Oral QHS  . multivitamin with minerals  1 tablet Oral Daily  . potassium chloride  20 mEq Oral Daily  . pravastatin  40 mg Oral QHS  . predniSONE  2.5 mg Oral Q breakfast  . sodium chloride flush  3 mL Intravenous Q12H  . Vitamin D (Ergocalciferol)  50,000 Units Oral Q7 days   Continuous Infusions: . sodium chloride      Assessment & Plan:   Active Problems:   PAF (paroxysmal atrial fibrillation) (HCC)   CHF (congestive heart failure) (HCC)   Pressure injury of skin   Acute on chronic combined CHF: w/ non-ischemic cardiomyopathy. Last EF on was 20 to 25%. BNP very elevated on presentation Clinically improved. BNP down No need for doubtamine. bp holding.  Now on PO lasix, will decrease dose to 20mg  po bid Add toprol xl 12.5 mg qd with parameters Entresto as outpt if bp allows Counseled patient extensively about dietary and medical compliance.  I told her she is at high risk of readmission if she does not comply.  At this point I do think palliative care consulted .  ?PNA- cxr with edema v.s. pna. Recently was admitted for pna/chf. Likely Sob from chf, but empirically continuing abx for now D/c iv abx to augmentin po.to complete the course F/u procalcitonin  Hypokalemia-due to diuresis Replace with po as she cant tolerate iv  rheumatoid arthritis: continue plaquenil and prednisone home dose  PAF: continue amiodarone. Not currently on systemic anticoagulation due to fall risk.  Pt's cardiologist is Dr. Ubaldo Glassing  CKD stage IIIa: at baseline. Will continue to  monitor   Vitamin D deficiency: continue on vitamin D 50,000 units weekly  DL- on statin, will continue  PT/OT-SNF, pt has agreed to go  DVT prophylaxis: lovenox  Code Status: full  Family Communication: friend at bedside  Disposition Plan: snf Barrier: will need to go to snf, [place], case mx working for authorization     LOS: 3 days   Time spent: 45 minutes with more than 50% on [place], MD [facility] Pager [phone]  If 7PM-7AM, please contact night-coverage [email or web address] Password [email or web address] 12/01/2019, 12:51 PM Patient ID: Tina Salinas, female   DOB: 04-22-1946, 74 y.o.   MRN: [id number]
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
## 2019-12-01 NOTE — Care Management Important Message (Signed)
Important Message  Patient Details  Name: Tina Salinas MRN: [id number] Date of Birth: 09/29/1945   Medicare Important Message Given:  Yes     [name] 12/01/2019, 12:35 PM

## 2019-12-01 NOTE — Progress Notes (Signed)
   12/01/19 1100  ReDS Vest / Clip  Anatomical Comments NA due to BMI and kyphosis

## 2019-12-02 DIAGNOSIS — Z7189 Other specified counseling: Secondary | ICD-10-CM

## 2019-12-02 DIAGNOSIS — Z515 Encounter for palliative care: Secondary | ICD-10-CM

## 2019-12-02 DIAGNOSIS — R0600 Dyspnea, unspecified: Secondary | ICD-10-CM

## 2019-12-02 DIAGNOSIS — I509 Heart failure, unspecified: Secondary | ICD-10-CM

## 2019-12-02 DIAGNOSIS — I480 Paroxysmal atrial fibrillation: Secondary | ICD-10-CM

## 2019-12-02 DIAGNOSIS — J189 Pneumonia, unspecified organism: Secondary | ICD-10-CM

## 2019-12-02 LAB — BASIC METABOLIC PANEL
Anion gap: 6 (ref 5–15)
BUN: 17 mg/dL (ref 8–23)
CO2: 25 mmol/L (ref 22–32)
Calcium: 7.9 mg/dL — ABNORMAL LOW (ref 8.9–10.3)
Chloride: 107 mmol/L (ref 98–111)
Creatinine, Ser: 0.86 mg/dL (ref 0.44–1.00)
GFR calc Af Amer: >60 mL/min (ref >60)
GFR calc non Af Amer: >60 mL/min (ref >60)
Glucose, Bld: 97 mg/dL (ref 70–99)
Potassium: 4.5 mmol/L (ref 3.5–5.1)
Sodium: 138 mmol/L (ref 135–145)

## 2019-12-02 MED ORDER — ACETAMINOPHEN 325 MG PO TABS: 650.0000 mg | ORAL_TABLET | ORAL | Status: DC | PRN

## 2019-12-02 MED ORDER — QUETIAPINE FUMARATE 25 MG PO TABS
25.0000 mg | ORAL_TABLET | Freq: Every day | ORAL | Status: DC
  Administered 2019-12-02 – 2019-12-04 (×3): 25 mg via ORAL
  Filled 2019-12-02 (×3): qty 1

## 2019-12-02 MED ORDER — FUROSEMIDE 20 MG PO TABS
20.0000 mg | ORAL_TABLET | Freq: Every day | ORAL | Status: DC
  Filled 2019-12-02: qty 1

## 2019-12-02 NOTE — Progress Notes (Addendum)
PROGRESS NOTE    Tina Salinas  [id number] DOB: August 28, 1945 DOA: 11/28/2019 PCP: Birdie Sons, MD    Brief Narrative:  Tina Salinas is a 74 y.o. 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 fib on amiodarone, not on anticoagulation due to fall risk, RA on methotrexate and chronic prednisone who presents to the emergency room with a couple of days of not feeling well and today with sob and wheezing.  Denies chest pain, fever, chills.  She was found with increased respiratory effort with O2 sats ranging from 88 to 94% and patient was placed on 2 L nasal cannula had comfort in the ER. In the ED he was found with a BNP of 3400    Consultants:   Cardiology  Procedures:   Antimicrobials:   Azithromycin ceftriaxone   Subjective: Case manager and I were trying to discuss placement with the patient as [facility].  She became irrational and was raising her voice.  She was telling us that we are forcing her to make decisions and she is not going to make a decision.  She is not complaining of any shortness of breath, chest pain or any other symptoms.  Will not let us know whether she wants home health or pick another facility to be transferred to.  Objective: Vitals:   12/02/19 0544 12/02/19 0751 12/02/19 0911 12/02/19 1220  BP: 111/75 107/68 104/64 127/82  Pulse: 68 64 (!) 59 82  Resp: 14 16  16   Temp: 97.8 F (36.6 C) (!) 97.5 F (36.4 C)  97.6 F (36.4 C)  TempSrc: Oral Oral    SpO2: 100% 100%  99%  Weight: 40.6 kg     Height:        Intake/Output Summary (Last 24 hours) at 12/02/2019 1449 Last data filed at 12/02/2019 1102 Gross per 24 hour  Intake 0 ml  Output 550 ml  Net -550 ml   Filed Weights   11/30/19 0441 12/01/19 0349 12/02/19 0544  Weight: 63 kg 40.9 kg  40.6 kg    Examination:  General exam: NAD, sitting in bed. Respiratory system: cta, no crackles or wheezing Cardiovascular system: S1 & S2 heard, RRR. No JVD, murmurs, rubs, gallops or clicks. . Gastrointestinal system: Abdomen is nondistended, soft and nontender.. Normal bowel sounds heard. Central nervous system: unable to assess as she will not answer the questions  Extremities: no edema  Skin:warm dry Psychiatry: in foul mood.       Data Reviewed: I have personally reviewed following labs and imaging studies  CBC: Recent Labs  Lab 11/28/19 1400  WBC 10.2  NEUTROABS 8.5*  HGB 9.7*  HCT 31.4*  MCV 97.8  PLT [id number]   Basic Metabolic Panel: Recent Labs  Lab 11/28/19 1400 11/29/19 0500 11/30/19 0736 12/01/19 0538 12/02/19 0509  NA 139 138 141 140 138  K 3.6 3.1* 2.9* 3.9 4.5  CL 108 104 107 105 107  CO2 22 24 25 24 25   GLUCOSE 95 86 90 88 97  BUN 19 20 20 17 17   CREATININE 0.82 0.98 0.91 0.94 0.86  CALCIUM 8.0* 7.6* 7.7* 7.8* 7.9*  MG  --   --  2.0  --   --    GFR: Estimated Creatinine Clearance: 37.9 mL/min (by C-G formula based on SCr of 0.86 mg/dL). Liver  Function Tests: Recent Labs  Lab 11/28/19 1400  AST 16  ALT 10  ALKPHOS 75  BILITOT 1.0  PROT 6.5  ALBUMIN 2.5*   No results for input(s): LIPASE, AMYLASE in the last 168 hours. No results for input(s): AMMONIA in the last 168 hours. Coagulation Profile: Recent Labs  Lab 11/28/19 1400  INR 1.2   Cardiac Enzymes: No results for input(s): CKTOTAL, CKMB, CKMBINDEX, TROPONINI in the last 168 hours. BNP (last 3 results) No results for input(s): PROBNP in the last 8760 hours. HbA1C: No results for input(s): HGBA1C in the last 72 hours. CBG: Recent Labs  Lab 11/29/19 1534 11/29/19 1727  GLUCAP 74 131*   Lipid Profile: No results for input(s): CHOL, HDL, LDLCALC, TRIG, CHOLHDL, LDLDIRECT in the last 72 hours. Thyroid Function Tests: No results for input(s): TSH, T4TOTAL, FREET4, T3FREE,  THYROIDAB in the last 72 hours. Anemia Panel: No results for input(s): VITAMINB12, FOLATE, FERRITIN, TIBC, IRON, RETICCTPCT in the last 72 hours. Sepsis Labs: Recent Labs  Lab 11/28/19 1400  LATICACIDVEN 1.2    Recent Results (from the past 240 hour(s))  Blood Culture (routine x 2)     Status: None (Preliminary result)   Collection Time: 11/28/19  2:00 PM   Specimen: BLOOD  Result Value Ref Range Status   Specimen Description BLOOD LEFT ANTECUBITAL  Final   Special Requests   Final    BOTTLES DRAWN AEROBIC AND ANAEROBIC Blood Culture results may not be optimal due to an inadequate volume of blood received in culture bottles   Culture   Final    NO GROWTH 4 DAYS Performed at [facility], [address], [place], [place] [address]    Report Status PENDING  Incomplete  Blood Culture (routine x 2)     Status: None (Preliminary result)   Collection Time: 11/28/19  2:00 PM   Specimen: BLOOD  Result Value Ref Range Status   Specimen Description BLOOD LEFT ANTECUBITAL  Final   Special Requests   Final    BOTTLES DRAWN AEROBIC AND ANAEROBIC Blood Culture results may not be optimal due to an inadequate volume of blood received in culture bottles   Culture   Final    NO GROWTH 4 DAYS Performed at [facility], [address], [place],  [address]    Report Status PENDING  Incomplete  SARS Coronavirus 2 by RT PCR (hospital order, performed in [place] hospital lab) Nasopharyngeal Nasopharyngeal Swab     Status: None   Collection Time: 11/28/19  2:01 PM   Specimen: Nasopharyngeal Swab  Result Value Ref Range Status   SARS Coronavirus 2 NEGATIVE NEGATIVE Final    Comment: (NOTE) SARS-CoV-2 target nucleic acids are NOT DETECTED. The SARS-CoV-2 RNA is generally detectable in upper and lower respiratory specimens during the acute phase of infection. The lowest concentration of SARS-CoV-2 viral copies this assay can detect is 250 copies / mL. A negative result  does not preclude SARS-CoV-2 infection and should not be used as the sole basis for treatment or other patient management decisions.  A negative result may occur with improper specimen collection / handling, submission of specimen other than nasopharyngeal swab, presence of viral mutation(s) within the areas targeted by this assay, and inadequate number of viral copies (<250 copies / mL). A negative result must be combined with clinical observations, patient history, and epidemiological information. Fact Sheet for Patients:   StrictlyIdeas.no Fact Sheet for Healthcare Providers: BankingDealers.co.za This test is not yet approved or  cleared  by the [place] and has been authorized for detection and/or diagnosis of SARS-CoV-2 by FDA under an Emergency Use Authorization (EUA).  This EUA will remain in effect (meaning this test can be used) for the duration of the COVID-19 declaration under Section 564(b)(1) of the Act, 21 U.S.C. section 360bbb-3(b)(1), unless the authorization is terminated or revoked sooner. Performed at [facility], [address] [place], [place] [address]   MRSA PCR Screening     Status: None   Collection Time: 11/29/19  4:34 PM   Specimen: Nasal Mucosa; Nasopharyngeal  Result Value Ref Range Status   MRSA by PCR NEGATIVE NEGATIVE Final    Comment:        The GeneXpert MRSA Assay (FDA approved for NASAL specimens only), is one component of a comprehensive MRSA colonization surveillance program. It is not intended to diagnose MRSA infection nor to guide or monitor treatment for MRSA infections. Performed at [facility], [address] [place], [place] [address]          Radiology Studies: DG Elbow 2 Views Left  Result Date: 12/01/2019 CLINICAL DATA:  Elbow swelling. EXAM: LEFT ELBOW - 2 VIEW COMPARISON:  None. FINDINGS: There is no evidence of fracture, dislocation, or joint  effusion. There is no evidence of arthropathy or other focal bone abnormality. Soft tissues are unremarkable. IMPRESSION: Negative. Electronically Signed   By: Ulyses Jarred M.D.   On: 12/01/2019 03:33        Scheduled Meds: . amiodarone  200 mg Oral Daily  . amoxicillin-clavulanate  1 tablet Oral Q12H  . aspirin EC  81 mg Oral Daily  . calcium carbonate  500 mg Oral Daily  . cholecalciferol  1,000 Units Oral Daily  . docusate sodium  100 mg Oral Daily  . enoxaparin (LOVENOX) injection  40 mg Subcutaneous Q24H  . feeding supplement (ENSURE ENLIVE)  237 mL Oral BID BM  . folic acid  1 mg Oral Daily  . furosemide  20 mg Oral BID  . hydroxychloroquine  400 mg Oral Daily  . latanoprost  1 drop Both Eyes QHS  . metoprolol succinate  12.5 mg Oral Daily  . mirtazapine  30 mg Oral QHS  . multivitamin with minerals  1 tablet Oral Daily  . potassium chloride  20 mEq Oral Daily  . pravastatin  40 mg Oral QHS  . predniSONE  2.5 mg Oral Q breakfast  . sodium chloride flush  3 mL Intravenous Q12H  . Vitamin D (Ergocalciferol)  50,000 Units Oral Q7 days   Continuous Infusions: . sodium chloride      Assessment & Plan:   Active Problems:   PAF (paroxysmal atrial fibrillation) (HCC)   CHF (congestive heart failure) (HCC)   Pressure injury of skin   Acute on chronic combined CHF: w/ non-ischemic cardiomyopathy. Last EF on was 20 to 25%. BNP very elevated on presentation Clinically improved. BNP down No need for doubtamine. bp holding.  Now euvolemic and clinically I proved.  Now on PO lasix, will decrease dose to 20mg  po bid...>decrease to daily so bp has room for beta blk Continue with toprol xl 12.5 mg qd with parameters Entresto as outpt if bp allows Counseled patient extensively about dietary and medical compliance.  I told her she is at high risk of readmission if she does not comply.  At this point I do think palliative care consulted , pending  ?PNA- cxr with edema v.s. pna.  Recently was  admitted for pna/chf. Likely Sob from chf, but empirically continuing abx for now D/c'd iv abx to augmentin po.to complete the course F/u procalcitonin  Hypokalemia-due to diuresis Replace with po as she cant tolerate iv  rheumatoid arthritis: continue plaquenil and prednisone home dose  PAF: continue amiodarone. Not currently on systemic anticoagulation due to fall risk.  Pt's cardiologist is Dr. Ubaldo Glassing  CKD stage IIIa: at baseline. Will continue to monitor   Vitamin D deficiency: continue on vitamin D 50,000 units weekly  DL- on statin, will continue  PT/OT-SNF, pt has agreed to go, but [facility] refused [facility]. Now needs a new facility but pt today appears irrational and refuses to tell us what she wants. Likely with some dementia, will consult psych to see if competent to make decisions.  DVT prophylaxis: lovenox  Code Status: full  Family Communication: friend at bedside  Disposition Plan: snf Barrier: needs psych assessment to see if she is competent to make decision as she is refusing to discuss whether she wants snf or home health, case management on board. Discharge safety is an issue     LOS: 4 days   Time spent: 45 minutes with more than 50% on [place], MD [facility] Pager [phone]  If 7PM-7AM, please contact night-coverage [email or web address] Password [facility] 12/02/2019, 2:49 PM Patient ID: Tina Salinas, female   DOB: 1945-07-19, 74 y.o.   MRN: [id number]

## 2019-12-02 NOTE — Consult Note (Signed)
Consultation Note Date: 12/02/2019   Patient Name: Tina Salinas  DOB: [birth year]  MRN: [id number]  Age / Sex: 74 y.o., female   PCP: Birdie Sons, MD Referring Physician: Nolberto Hanlon, MD   REASON FOR CONSULTATION:Establishing goals of care  Palliative Care consult requested for goals of care discussion in this 74 y.o. 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 fib on amiodarone, not on anticoagulation due to fall risk, RA on methotrexate and chronic prednisone. She presented to the ED with complaints of shortness of breath and wheezing. During ED work-up was found to have increased respiratory efforts (oxygen saturations 88-94%) was placed on 2L with improvement. BNP 3400. Chest x-ray showed bilateral airspace disease could reflect asymmetric edema or infection and a small left effusion. She was started on IV lasix and IV antibiotics post blood cultures. Since admission evaluated and being followed by cardiology with continued home medications and recommendations for outpatient follow-up.   Clinical Assessment and Goals of Care: I have reviewed medical records including lab results, imaging, Epic notes, and MAR, received report from the bedside RN, and assessed the patient. I met at the bedside with patient to discuss diagnosis prognosis, GOC, EOL wishes, disposition and options.  I introduced Palliative Medicine as specialized medical care for people living with serious illness. It focuses on providing relief from the symptoms and stress of a serious illness. The goal is to improve quality of life for both the patient and the family. She verbalized understanding.   Tina Salinas was awake, alert and oriented x3. She is on her cell phone and looking through some papers. She answers all of my questions appropriately but is very upset and seems to think that the  medical team is trying to send her some place she doesn't want to be, make her sign over her money (which she states she has 150K in the bank), and is holding her against her will. Therapeutic listening and support given.   Patient states that CSW is telling her she cannot return to [facility] facility however she has called and spoken to her PCP who says he will assist her in going there because this is the place she would like to go. She reports she is not being noncompliant but knows where she wants to be.   She reports she does not have any family, her husband passed away over 15 years ago. She does not have any children. She reports her best friend Tina Salinas has keys to her home and caring for things while she is hospitalized.   I asked if we could include Tina Salinas in discussions however she declines, stating she did not have or want anyone included in her medical care unless she involved them.   Prior to admission she states she was independent, providing all ADLs including driving.   We discussed Her current illness and what it means in the larger context of Her on-going co-morbidities.   I attempted to elicit values and goals of care important to the patient.  Tina Salinas reports her goals are to continue with rehab and do whatever needs to be done to provider her the opportunity to improve.   She begins to state the medical team is being convinced she is "crazy" because she has her own wishes and specific things she will do and won't do. She states "I know what I am doing and saying,  and I not what you all are not going to make me do. I have called the authorities and my attorney and you can't keep me here against my will or make me to do something when I have not been committed!"   She is threatening to leave later today and asking what specific floor she is on in the hospital. She states someone is going to pick her up once she calls them because she is not going to stay here and be told what she has to  do.   I attempted to discuss with her given [facility] was not available to go to if it would be helpful to research or provide printout to other locations to see if she would consider, however she declines and begin to state "they got to me too!" Explained I was trying to make sure she was able to research the others and know that she had other options and some would provide just as good of care as [facility] has in the past.   Patient's tray at the bedside. She reports she is not going to eat anything because she did not trust the nurses or the doctor. States "we" may be putting medications to sedate her and she will then be sent somewhere because she would not be able to make decisions. Again educated and attempted to explain to patient the medical team would not do this and her food had not been tampered with.   I created space and opportunity to discuss advanced directives. Patient reports she does not have a documented directives. She states Tina Salinas is her best friend but she would not want her making medical or financial decisions for her. I explained the importance of having a documented and identified medical decisions maker in case of an emergency. She verbalized understanding expressing "God is my decision maker!" When I am gone I am gone, noone will need to make decisions at that point!" used this opportunity to discuss her full code status. Education provided in detail with consideration with her current illness and previous statement of being gone when she is gone. She verbalized wishes for full code stating she understands and is thinking about what she would want. I again used this opportunity to express that if she was to encounter an emergent event without any family or a documented HCPOA that the medical team would collectively and by law make the best decisions on her behalf. She stated "whatever".   She shares that she has 150K in the bank and that she hasn't found anyone nice enough or who has been  deserving to leave over her estates. She then states she has a lot of decisions to think about and make, before "we label her crazy!"    Questions and concerns were addressed. The family was encouraged to call with questions.  PMT will continue to support holistically.   SOCIAL HISTORY:     reports that she has never smoked. She has never used smokeless tobacco. She reports that she does not drink alcohol or use drugs.  CODE STATUS: Full code  ADVANCE DIRECTIVES: Patient, she has no documented advanced directives and states she does not have anyone she would trust to make decisions although she speaks of her best friend Tina Salinas.    SYMPTOM MANAGEMENT: Per attending   PSYCHO-SOCIAL/SPIRITUAL:  Support System: Family  Desire for further Chaplaincy support: no   Additional Recommendations (Limitations, Scope, Preferences):  Full Scope Treatment   PAST MEDICAL HISTORY: Past Medical History:  Diagnosis Date  . Adenoma   . CAP (community acquired pneumonia) 11/16/2017  . Cerebral palsy ([place])   . CHF (congestive heart failure) ([place])   . Femur fracture ([place])   . History of blood transfusion   . Hypertension   . Osteoarthritis   . Osteoporosis   . Rheumatoid arthritis ([place])     PAST SURGICAL HISTORY:  Past Surgical History:  Procedure Laterality Date  . BREAST ENHANCEMENT SURGERY Bilateral 1980  . COLONOSCOPY    . COLONOSCOPY WITH PROPOFOL N/A 06/18/2019   Procedure: COLONOSCOPY WITH PROPOFOL;  Surgeon: Robert Bellow, MD;  Location: [facility] ENDOSCOPY;  Service: Endoscopy;  Laterality: N/A;  . CORONARY ANGIOPLASTY WITH STENT PLACEMENT  07/13/2008, 08/19/2008  . ESOPHAGOGASTRODUODENOSCOPY (EGD) WITH PROPOFOL N/A 06/18/2019   Procedure: ESOPHAGOGASTRODUODENOSCOPY (EGD) WITH PROPOFOL;  Surgeon: Robert Bellow, MD;  Location: [facility] ENDOSCOPY;  Service: Endoscopy;  Laterality: N/A;  . HIP ARTHROPLASTY N/A 2000  . JOINT REPLACEMENT    . LEFT HEART CATH AND CORONARY ANGIOGRAPHY N/A  05/16/2017   Procedure: LEFT HEART CATH AND CORONARY ANGIOGRAPHY;  Surgeon: Teodoro Spray, MD;  Location: [facility];  Service: Cardiovascular;  Laterality: N/A;  . TOTAL KNEE ARTHROPLASTY Left 09/2007, 01/2008    ALLERGIES:  is allergic to etodolac; sulfa antibiotics; and tetracycline.   MEDICATIONS:  Current Facility-Administered Medications  Medication Dose Route Frequency Provider Last Rate Last Admin  . 0.9 %  sodium chloride infusion  250 mL Intravenous PRN Nolberto Hanlon, MD      . acetaminophen (TYLENOL) tablet 650 mg  650 mg Oral Q4H PRN Oswald Hillock, RPH      . amiodarone (PACERONE) tablet 200 mg  200 mg Oral Daily Nolberto Hanlon, MD   200 mg at 12/02/19 1231  . amoxicillin-clavulanate (AUGMENTIN) 875-125 MG per tablet 1 tablet  1 tablet Oral Q12H Oswald Hillock, RPH   1 tablet at 12/02/19 1115  . aspirin EC tablet 81 mg  81 mg Oral Daily Nolberto Hanlon, MD   81 mg at 12/02/19 0914  . calcium carbonate (TUMS - dosed in mg elemental calcium) chewable tablet 500 mg  500 mg Oral Daily Nolberto Hanlon, MD   500 mg at 12/02/19 0914  . cholecalciferol (VITAMIN D3) tablet 1,000 Units  1,000 Units Oral Daily Nolberto Hanlon, MD   1,000 Units at 12/02/19 0913  . docusate sodium (COLACE) capsule 100 mg  100 mg Oral Daily Lang Snow, NP   100 mg at 12/02/19 0914  . doxylamine (Sleep) (UNISOM) tablet 25 mg  25 mg Oral QHS PRN Lang Snow, NP      . enoxaparin (LOVENOX) injection 40 mg  40 mg Subcutaneous Q24H Nolberto Hanlon, MD   40 mg at 12/01/19 2145  . feeding supplement (ENSURE ENLIVE) (ENSURE ENLIVE) liquid 237 mL  237 mL Oral BID BM Nolberto Hanlon, MD   237 mL at 11/30/19 1352  . folic acid (FOLVITE) tablet 1 mg  1 mg Oral Daily Nolberto Hanlon, MD   1 mg at 12/02/19 0913  . [START ON 12/03/2019] furosemide (LASIX) tablet 20 mg  20 mg Oral Daily Nolberto Hanlon, MD      . HYDROcodone-acetaminophen [facility]) 10-325 MG per tablet 1 tablet  1 tablet Oral Q6H PRN Lang Snow, NP   1 tablet at 12/02/19 5208  . hydroxychloroquine (PLAQUENIL) tablet 400 mg  400 mg Oral Daily Nolberto Hanlon, MD   400 mg at 12/02/19 0914  . latanoprost (XALATAN)  0.005 % ophthalmic solution 1 drop  1 drop Both Eyes QHS Nolberto Hanlon, MD   1 drop at 12/01/19 2200  . metoprolol succinate (TOPROL-XL) 24 hr tablet 12.5 mg  12.5 mg Oral Daily Nolberto Hanlon, MD   12.5 mg at 12/01/19 1725  . mirtazapine (REMERON) tablet 30 mg  30 mg Oral QHS Nolberto Hanlon, MD   30 mg at 12/01/19 2145  . multivitamin with minerals tablet 1 tablet  1 tablet Oral Daily Nolberto Hanlon, MD   1 tablet at 12/02/19 0914  . ondansetron (ZOFRAN) injection 4 mg  4 mg Intravenous Q6H PRN Nolberto Hanlon, MD      . potassium chloride SA (KLOR-CON) CR tablet 20 mEq  20 mEq Oral Daily Nolberto Hanlon, MD   20 mEq at 12/02/19 0913  . pravastatin (PRAVACHOL) tablet 40 mg  40 mg Oral QHS Nolberto Hanlon, MD   40 mg at 12/01/19 2145  . predniSONE (DELTASONE) tablet 2.5 mg  2.5 mg Oral Q breakfast Nolberto Hanlon, MD   2.5 mg at 12/02/19 0914  . sodium chloride flush (NS) 0.9 % injection 3 mL  3 mL Intravenous Q12H Nolberto Hanlon, MD   3 mL at 12/02/19 0915  . sodium chloride flush (NS) 0.9 % injection 3 mL  3 mL Intravenous PRN Nolberto Hanlon, MD   3 mL at 11/30/19 0945  . Vitamin D (Ergocalciferol) (DRISDOL) capsule 50,000 Units  50,000 Units Oral Q7 days Nolberto Hanlon, MD   50,000 Units at 11/30/19 0945    VITAL SIGNS: BP 127/82 (BP Location: Right Arm)   Pulse 82   Temp 97.6 F (36.4 C)   Resp 16   Ht 5' 1"  (1.549 m)   Wt 40.6 kg   SpO2 99%   BMI 16.91 kg/m  Filed Weights   11/30/19 0441 12/01/19 0349 12/02/19 0544  Weight: 63 kg 40.9 kg 40.6 kg    Estimated body mass index is 16.91 kg/m as calculated from the following:   Height as of this encounter: 5' 1"  (1.549 m).   Weight as of this encounter: 40.6 kg.  LABS: CBC:    Component Value Date/Time   WBC 10.2 11/28/2019 1400   HGB 9.7 (L) 11/28/2019 1400   HGB 12.3  03/01/2018 1404   HCT 31.4 (L) 11/28/2019 1400   HCT 36.6 03/01/2018 1404   PLT 250 11/28/2019 1400   PLT 221 03/01/2018 1404   Comprehensive Metabolic Panel:    Component Value Date/Time   NA 138 12/02/2019 0509   NA 142 09/01/2019 0000   K 4.5 12/02/2019 0509   CO2 25 12/02/2019 0509   BUN 17 12/02/2019 0509   BUN 17 09/01/2019 0000   CREATININE 0.86 12/02/2019 0509   ALBUMIN 2.5 (L) 11/28/2019 1400   ALBUMIN 4.1 07/15/2018 1532     Review of Systems  Unable to perform ROS: Acuity of condition  Unless otherwise noted, a complete review of systems is negative.  Physical Exam General: NAD Cardiovascular: regular rate and rhythm Pulmonary: normal breathing pattern Neurological: A&O x3, answers questions appropriately but also has high evidence of distrust/paranoia, follows commands    Prognosis: Unable to determine  Discharge Planning:  To Be Determined  Recommendations:  Full Code-per patient request. States she is thinking about whether she would want to be a DNR.   Continue with current plan of care per medical team  Patient is upset/paranoid that she is being kept here against her will, being forced to go to a different  location because she wants to go to [facility], despite awareness they have declined her to return, not eating due to fear of food tampering. States she only wants to go to [facility] for rehab, she is not crazy, but wants what she wants, wants to get better and get back home.   Declined to involve friend Tina Salinas who she says is her support and closes thing to family she has left, does not want her to be her medical decision maker however, states she is the only one to make decisions despite education on what to do if she is unable to make decisions and who to contact. Does not wish to complete AD or to involve Tina Salinas in her care.   Per Dr. Kurtis Bushman plans for consulting psychology. Feels this would be appropriate. Patient is alert and oriented, follows commands, speaks  appropriately however with periods of anxiety, expressed frustration, and somewhat paranoid.   PMT will continue to support as needed.    Palliative Performance Scale: PPS 30-40%                Patient expressed understanding and was in agreement with this plan.   Thank you for allowing the Palliative Medicine Team to assist in the care of this patient.  Time In: 1535 Time Out: 1620 Time Total: 45 min.   Visit consisted of counseling and education dealing with the complex and emotionally intense issues of symptom management and palliative care in the setting of serious and potentially life-threatening illness.Greater than 50%  of this time was spent counseling and coordinating care related to the above assessment and plan.  Signed by:  Alda Lea, AGPCNP-BC Palliative Medicine Team  Phone: [phone] Fax: [phone] Pager: [phone] Amion: Bjorn Pippin

## 2019-12-02 NOTE — Progress Notes (Signed)
Patient has remained disoriented and paranoid today. She has called the police stating she has been kidnapped and we are holding her hostage. Intermittent periods of answering orientation questions appropriately. Providers are aware. Palliative attempted consult, but she was not interested in speaking with them. MD has notified psych as well. Patient is non-impulsive with floor mats in place.

## 2019-12-02 NOTE — Progress Notes (Signed)
[facility]    SUBJECTIVE: Patient resting quietly in bed semisleeping slightly lethargic but arousable answers questions adequately denied any significant discrete complaints of pain or shortness of breath   Vitals:   12/02/19 0544 12/02/19 0751 12/02/19 0911 12/02/19 1220  BP: 111/75 107/68 104/64 127/82  Pulse: 68 64 (!) 59 82  Resp: 14 16  16   Temp: 97.8 F (36.6 C) (!) 97.5 F (36.4 C)  97.6 F (36.4 C)  TempSrc: Oral Oral    SpO2: 100% 100%  99%  Weight: 40.6 kg     Height:         Intake/Output Summary (Last 24 hours) at 12/02/2019 1556 Last data filed at 12/02/2019 1102 Gross per 24 hour  Intake 0 ml  Output 550 ml  Net -550 ml      PHYSICAL EXAM  General: Well developed, well nourished, in no acute distress HEENT:  Normocephalic and atramatic Neck:  No JVD.  Lungs: Clear bilaterally to auscultation and percussion. Heart: HRRR . Normal S1 and S2 without gallops or murmurs.  Abdomen: Bowel sounds are positive, abdomen soft and non-tender  Msk:  Back normal, normal gait. Normal strength and tone for age. Extremities: No clubbing, cyanosis or edema.   Neuro: Alert and oriented X 3. Psych:  Good affect, responds appropriately   LABS: Basic Metabolic Panel: Recent Labs    11/30/19 0736 11/30/19 0736 12/01/19 0538 12/02/19 0509  NA 141   < > 140 138  K 2.9*   < > 3.9 4.5  CL 107   < > 105 107  CO2 25   < > 24 25  GLUCOSE 90   < > 88 97  BUN 20   < > 17 17  CREATININE 0.91   < > 0.94 0.86  CALCIUM 7.7*   < > 7.8* 7.9*  MG 2.0  --   --   --    < > = values in this interval not displayed.   Liver Function Tests: No results for input(s): AST, ALT, ALKPHOS, BILITOT, PROT, ALBUMIN in the last 72 hours. No results for input(s): LIPASE, AMYLASE in the last 72 hours. CBC: No results for input(s): WBC, NEUTROABS, HGB, HCT, MCV, PLT in the last 72 hours. Cardiac Enzymes: No results for input(s): CKTOTAL, CKMB, CKMBINDEX, TROPONINI in the last 72  hours. BNP: Invalid input(s): POCBNP D-Dimer: No results for input(s): DDIMER in the last 72 hours. Hemoglobin A1C: No results for input(s): HGBA1C in the last 72 hours. Fasting Lipid Panel: No results for input(s): CHOL, HDL, LDLCALC, TRIG, CHOLHDL, LDLDIRECT in the last 72 hours. Thyroid Function Tests: No results for input(s): TSH, T4TOTAL, T3FREE, THYROIDAB in the last 72 hours.  Invalid input(s): FREET3 Anemia Panel: No results for input(s): VITAMINB12, FOLATE, FERRITIN, TIBC, IRON, RETICCTPCT in the last 72 hours.  DG Elbow 2 Views Left  Result Date: 12/01/2019 CLINICAL DATA:  Elbow swelling. EXAM: LEFT ELBOW - 2 VIEW COMPARISON:  None. FINDINGS: There is no evidence of fracture, dislocation, or joint effusion. There is no evidence of arthropathy or other focal bone abnormality. Soft tissues are unremarkable. IMPRESSION: Negative. Electronically Signed   By: Ulyses Jarred M.D.   On: 12/01/2019 03:33     Echo previous echocardiogram with known cardiomyopathy ejection fraction between 20-25 %  TELEMETRY: Normal sinus rhythm rate of about 70 nonspecific ST-T wave changes:  ASSESSMENT AND PLAN:  Active Problems:   PAF (paroxysmal atrial fibrillation) (HCC)   CHF (congestive heart failure) ([place])  Pressure injury of skin Generalized weakness Chronic renal insufficiency Cardiomyopathy systolic dysfunction ejection fraction between 20 to 25% Hyperlipidemia  Cerebral palsy Fatigue Arthritis Transient hypotension . PLAN Continue therapy for congestive heart failure systolic dysfunction Rate control for atrial fibrillation amiodarone Consider physical therapy for generalized weakness Antibiotics for possible pneumonia Consider discharge home follow-up with cardiology as an outpatient     Yolonda Kida, MD 12/02/2019 3:56 PM

## 2019-12-02 NOTE — Progress Notes (Signed)
BP soft this morning. Per Dr. Kurtis Bushman, continue to monitor and space out all BP/HR meds a few hours apart from each other. Will monitor.

## 2019-12-02 NOTE — Progress Notes (Signed)
Pt has been confused throughout the nightshift and at times has had hallucinations.  Pt has c/o'd bugs crawling all over the walls and talks out loud to herself and others that are not there.  She does have rare moments of clarity and she says she is confused because of the medicines shes been getting at SNF.  Pt does not know which meds.  Pt can be very mean and inappropriate at times.  Pt reoriented several times but it just seems to make her more upset.  Will cont to observe.

## 2019-12-02 NOTE — TOC Progression Note (Addendum)
Transition of Care [facility]) - Progression Note    Patient Details  Name: Tina Salinas MRN: [id number] Date of Birth: Feb 24, 1946  Transition of Care [facility]) CM/SW [place], RN Phone Number: 12/02/2019, 9:31 AM  Clinical Narrative:    Patient avoidant this morning and wanted to finish breakfast.  She understands that [facility] will not accept her back at this time.  Will continue bed search and speak with patient after breakfast.    Expected Discharge Plan: [place] Barriers to Discharge: Continued Medical Work up  Expected Discharge Plan and Services Expected Discharge Plan: [place] Choice: [place] arrangements for the past 2 months: Single Family Home                                       Social Determinants of Health (SDOH) Interventions    Readmission Risk Interventions No flowsheet data found.

## 2019-12-02 NOTE — Progress Notes (Signed)
Pt keeps calling people on the phone askign them to come pick her up.  She keeps saying she is leaving.  Np on call notified, see orders.

## 2019-12-02 NOTE — TOC Progression Note (Signed)
Transition of Care [facility]) - Progression Note    Patient Details  Name: SRI BODIFORD MRN: [id number] Date of Birth: 1946-05-14  [facility]) CM/SW [place], RN Phone Number: 12/02/2019, 8:46 AM  Clinical Narrative:     Facility called this morning and said they would not be abe to accept this patient in their facility.     Expected Discharge Plan: [place] Barriers to Discharge: Continued Medical Work up  Expected Discharge Plan and Services Expected Discharge Plan: [place] Choice: [place] arrangements for the past 2 months: Single Family Home                                       Social Determinants of Health (SDOH) Interventions    Readmission Risk Interventions No flowsheet data found.

## 2019-12-02 NOTE — TOC Progression Note (Signed)
Transition of Care [facility]) - Progression Note    Patient Details  Name: Tina Salinas MRN: [id number] Date of Birth: 1946-02-26  Transition of Care [facility]) CM/SW [place], RN Phone Number: 12/02/2019, 10:10 AM  Clinical Narrative:    Met with patient, bedside nurse and Dr Kurtis Bushman.  Patient is very confused and aggressive this morning.  She is unable to make clear decisions at this time.  Will defer bed choice and offer to another time.    Expected Discharge Plan: [place] Barriers to Discharge: Continued Medical Work up  Expected Discharge Plan and Services Expected Discharge Plan: [place] Choice: [place] arrangements for the past 2 months: Single Family Home                                       Social Determinants of Health (SDOH) Interventions    Readmission Risk Interventions No flowsheet data found.

## 2019-12-03 LAB — BASIC METABOLIC PANEL
Anion gap: 7 (ref 5–15)
BUN: 14 mg/dL (ref 8–23)
CO2: 26 mmol/L (ref 22–32)
Calcium: 8.2 mg/dL — ABNORMAL LOW (ref 8.9–10.3)
Chloride: 108 mmol/L (ref 98–111)
Creatinine, Ser: 0.89 mg/dL (ref 0.44–1.00)
GFR calc Af Amer: >60 mL/min (ref >60)
GFR calc non Af Amer: >60 mL/min (ref >60)
Glucose, Bld: 99 mg/dL (ref 70–99)
Potassium: 4.2 mmol/L (ref 3.5–5.1)
Sodium: 141 mmol/L (ref 135–145)

## 2019-12-03 LAB — CULTURE, BLOOD (ROUTINE X 2)
Culture: NO GROWTH
Culture: NO GROWTH

## 2019-12-03 MED ORDER — CYANOCOBALAMIN 1000 MCG/ML IJ SOLN
1000.0000 ug | Freq: Once | INTRAMUSCULAR | Status: AC
  Administered 2019-12-03: 1000 ug via SUBCUTANEOUS
  Filled 2019-12-03: qty 1

## 2019-12-03 MED ORDER — FE FUMARATE-B12-VIT C-FA-IFC PO CAPS
1.0000 | ORAL_CAPSULE | Freq: Two times a day (BID) | ORAL | Status: DC
  Administered 2019-12-03 – 2019-12-05 (×5): 1 via ORAL
  Filled 2019-12-03 (×6): qty 1

## 2019-12-03 NOTE — Progress Notes (Signed)
Daily Progress Note   Patient Name: Tina Salinas       Date: 12/03/2019 DOB: 09/14/1945  Age: 74 y.o. MRN#: [id number] Attending Physician: Lavina Hamman, MD Primary Care Physician: Birdie Sons, MD Admit Date: 11/28/2019  Reason for Consultation/Follow-up: Establishing goals of care  Subjective: Patient awake, sitting up in bed. Denies pain or shortness of breath. States she is feeling much better today. Shares "yesterday was rough. I was really upset and concerned about my well being!" She is able to recall some of the events yesterday. She is A&O x3, answering questions appropriately. She recognized me and was able to recap our discussion from yesterday. She shares she has eaten breakfast and is waiting on lunch. Stating she was more comfortable eating today.   She reports she has had some time to think about things and has spoken to her "team" and made some decisions. She states she is leaving her assets and money to her 36 year old mother, however if her mother passes before her she has a plan to leave to her friend [name] and a few of her great nieces and nephews. She shares with me she is hopeful that she can return home eventually after rehab. She states [name] is her point of contact if needed and she is the only person she 100% trust to make medical decisions for her if needed.   Ms. Shute reports she knows her body is fragile and she does not think she would want to undergo heroic measures such as CPR and have her body traumatized. She reports she knows she would not want to be on life support at no cost. I educate patient based on her expressed wishes this would be considered a DNR. She verbalizes understanding and expresses she is aware and that several providers have discuss with her previously and she is able to recall our discussion on yesterday.   Ms. Confair states she would like to receive all care but she is ready to go to a rehab so she can focus on getting better. Support  given.   Education provided on outpatient palliative support however she declines at this time.   1440: Revisited patient at her request to have further discussions. At this time patient's friend, [name] is at the bedside. Patient asking or clarification regarding her Medicare Rights and Appeal document. She states this is what upset her yesterday because she felt someone was trying to convince her to sign over her rights. Detailed explanation and reading of her document to both her and [name]. Both verbalized understanding and appreciation of clarifying what the content and purpose of this document meant.   Patient also acknowledge to [name] that she would like her to be her emergency contact person and also her medical decision maker if needed, emphasizing she wanted to make her own decisions as long as she could. Support given and [name] verbalized understanding and appreciation knowing that this is patient's request. I discussed the importance of placing her wishes in writing and discussed completion of an advanced directive, however Ms. Whicker declines to complete. She has expressed she would not want to write out anything with fear of those not involved in her direct care altering documents for their benefits.   She requested to review all of her medications in detailed because she felt there were certain medicines she should not be taken. She was convinced she was receiving her MTX which she is not and stated should be on hold as  it was prior to admission. I reviewed every medication in detailed with comparison to her listed home medications and explained the purpose of each. She verbalized understanding and expressed her confidence in Dr. Bethanne Ginger opinions and suggestions regarding what she should and shouldn't be taking.   Discussed at length recommendations for rehab and that [facility] was not an option at this time. She continues to state Dr. Ubaldo Glassing is planning to get her into the facility. I explained once  appropriate for discharge she may require placement at another facility until they are able to work things out. Encouraged her to make decisions regarding other options. [name] also agreed. Patient verbalized understanding and requesting to receive the list of available beds offers for her review. She states she is ready to discharge and ready to begin working with therapy for a more consistent plan of working towards returning home with friends.    Patient again discussed her wishes for DNR. She verbalized she would not want chest compressions or to be placed on life support. I confirmed her wishes and [name] also verbalized agreement. Patient is aware RN would place DNR bracelet to identify her wishes. I further educated patient on what DNR/DNI would mean and that this would be documented on her charts and she would have an out of facility form. She verbalized understanding and agreement however requesting not to place bracelet until later after her visit with [name].   All questions answered and support given. [name] verbalized appreciation of discussion and clarification.   Length of Stay: 5 days  Vital Signs: BP (!) 96/57 (BP Location: Left Arm)   Pulse 68   Temp (!) 97.5 F (36.4 C) (Oral)   Resp 19   Ht 5\' 1"  (1.549 m)   Wt 41.5 kg   SpO2 95%   BMI 17.27 kg/m  SpO2: SpO2: 95 % O2 Device: O2 Device: Nasal Cannula O2 Flow Rate: O2 Flow Rate (L/min): 2 L/min  Intake/output summary:   Intake/Output Summary (Last 24 hours) at 12/03/2019 1407 Last data filed at 12/03/2019 0855 Gross per 24 hour  Intake 3 ml  Output 1150 ml  Net -1147 ml   LBM:   Baseline Weight: Weight: 54 kg Most recent weight: Weight: 41.5 kg  Physical Exam: -awake, A&O x3, thin -normal breathing efforts -mood appropriate, does still have some episodes of somewhat paranoid behaviors, follows commands, engages appropriately        Palliative Care Assessment & Plan    Code Status:  Full code-Although patient  expresses wishes for full code. Would like to further discuss with attending and re-evaluate once psych as seen and made recommendations regarding decision making ability.   Goals of Care:  Full Code-Requesting for DNR however Psych evaluation pending for competency.   Continue current plan of care per medical team  Patient requesting list for SNF options for rehab, with expressed goals of getting better and returning home with support of her friends.   PMT will continue to support and follow.   Prognosis: Guarded   Discharge Planning: To Be Determined  Thank you for allowing the Palliative Medicine Team to assist in the care of this patient.  Time Total: 65 min.   Visit consisted of counseling and education dealing with the complex and emotionally intense issues of symptom management and palliative care in the setting of serious and potentially life-threatening illness.Greater than 50%  of this time was spent counseling and coordinating care related to the above assessment and plan.  [employer],  AGPCNP-BC  Palliative Medicine Team [phone]

## 2019-12-03 NOTE — Progress Notes (Signed)
[facility] Progress Note  Patient: Tina Salinas    [id number]  DOA: 11/28/2019     Date of Service: the patient was seen and examined on 12/03/2019  Chief Complaint  Patient presents with  . Shortness of Breath   Brief hospital course: Past medical history of CKD 3a, CAD, chronic combined CHF, paroxysmal A. fib, RA on methotrexate and prednisone, not around 8 anticoagulation due to fall risk, cerebral palsy.  Presents with shortness of breath found to have acute on chronic combined CHF.  Currently further plan is monitoring psychiatry consultation for capacity evaluation as well as palliative care recommendation for goals of care.  Assessment and Plan: 1.  Acute on chronic combined systolic and diastolic CHF. Nonischemic cardiomyopathy. EF 20 to 25% Clinically euvolemic right now. Treated with IV Lasix.  Currently on p.o. Continue Toprol-XL. Entresto as outpatient as the BP allow. [facility] cardiology consultation. Palliative care consulted given patient's poor compliance and prognosis.  2.  Community-acquired pneumonia Treated with IV antibiotics. Currently on oral antibiotics. Improving.  Monitor.  3.  Hypokalemia Replaced as needed. Cannot tolerate IV.  4.  Paroxysmal A. fib Continue amiodarone. Not on any anticoagulation due to fall risk  5.  CKD stage IIIa Renal function baseline function monitor with diuresis.  6.  Rheumatoid arthritis Continuing Plaquenil and prednisone on home dose. Holding methotrexate the setting of recent pneumonia. Resume on follow-up with PCP.  7.  Vitamin D deficiency. Continue vitamin D replacement weekly.  8.  Hyperlipidemia. Continue statin.  9.  Paranoid behavior.  Confusion. ?  Dementia. At the request of prior hospital provider consulted psychiatry for further assessment. CT head March 2021.  Moderate to advanced periventricular white matter hypodensity, nonspecific but typically chronic small vessel ischemia.   10.  Physical deconditioning. Unable to ambulate. Unable to transfer. Poor bilateral lower extremity strength based on PT evaluation. Unsafe to go home given her poor insight and paranoid behavior at present. SNF recommended on discharge by PT and OT. Case management consulted.  11.  Underweight. Body mass index is 17.27 kg/m.  Nutrition Problem: Increased nutrient needs Etiology: chronic illness(CHF) Interventions: Interventions: Ensure Enlive (each supplement provides 350kcal and 20 grams of protein), MVI   12.  Mid coccyx stage II ulcer.  POA. Continue foam dressing.  Pressure Injury 11/30/19 Coccyx Mid Stage 2 -  Partial thickness loss of dermis presenting as a shallow open injury with a red, pink wound bed without slough. patient states the wound was a stage three and is in healing process (Active)  11/30/19 1500  Location: Coccyx  Location Orientation: Mid  Staging: Stage 2 -  Partial thickness loss of dermis presenting as a shallow open injury with a red, pink wound bed without slough.  Wound Description (Comments): patient states the wound was a stage three and is in healing process  Present on Admission: Yes    13.  Microcytic anemia. Vitamin B12 deficiency. B12 in March 2021 118. H&H relatively stable. We will continue to transfuse as needed for hemoglobin less than 7. Continue to replace B12.  Add folic acid.  Diet: Carb modified diet DVT Prophylaxis: Subcutaneous Heparin    Advance goals of care discussion: Full code  Family Communication: no family was present at bedside, at the time of interview.   Disposition:  Status is: Inpatient  Remains inpatient appropriate because:Altered mental status and Unsafe d/c plan significantly confused and paranoid.  Unable to verify competency on clinical examination.  Psychiatric consult for further assistance.  Dispo: The patient is from: Home              Anticipated d/c is to: SNF              Anticipated d/c date  is: 1 day              Patient currently is not medically stable to d/c.         Subjective: No nausea no vomiting.  More cooperative.  No fever no chills.  Blood pressure low.  Physical Exam: General:  alert oriented to time, place, and person.  Appear in mild distress, affect emotionally labile Eyes: PERRL ENT: Oral Mucosa Clear, moist  Neck: no JVD,  Cardiovascular: S1 and S2 Present, no Murmur,  Respiratory: good respiratory effort, Bilateral Air entry equal and Decreased, bilateral  Crackles, no wheezes Abdomen: Bowel Sound present, Soft and no tenderness,  Skin: no rash Extremities: no Pedal edema, no calf tenderness Neurologic: without any new focal findings Gait not checked due to patient safety concerns  Vitals:   12/02/19 1220 12/02/19 1611 12/02/19 2011 12/03/19 0410  BP: 127/82 (!) 101/58 130/74 97/60  Pulse: 82 74 94 79  Resp: 16 18 20 20   Temp: 97.6 F (36.4 C) 98.1 F (36.7 C) 97.8 F (36.6 C) 97.6 F (36.4 C)  TempSrc:  Oral Oral Oral  SpO2: 99% 99% 95% 100%  Weight:    41.5 kg  Height:        Intake/Output Summary (Last 24 hours) at 12/03/2019 0728 Last data filed at 12/03/2019 0419 Gross per 24 hour  Intake 0 ml  Output 1250 ml  Net -1250 ml   Filed Weights   12/01/19 0349 12/02/19 0544 12/03/19 0410  Weight: 40.9 kg 40.6 kg 41.5 kg    Data Reviewed: I have personally reviewed and interpreted daily labs, tele strips, imagings as discussed above. I reviewed all nursing notes, pharmacy notes, vitals, pertinent old records I have discussed plan of care as described above with RN and patient/family.  CBC: Recent Labs  Lab 11/28/19 1400  WBC 10.2  NEUTROABS 8.5*  HGB 9.7*  HCT 31.4*  MCV 97.8  PLT [id number]   Basic Metabolic Panel: Recent Labs  Lab 11/29/19 0500 11/30/19 0736 12/01/19 0538 12/02/19 0509 12/03/19 0539  NA 138 141 140 138 141  K 3.1* 2.9* 3.9 4.5 4.2  CL 104 107 105 107 108  CO2 24 25 24 25 26   GLUCOSE 86 90 88 97 99   BUN 20 20 17 17 14   CREATININE 0.98 0.91 0.94 0.86 0.89  CALCIUM 7.6* 7.7* 7.8* 7.9* 8.2*  MG  --  2.0  --   --   --     Studies: No results found.  Scheduled Meds: . amiodarone  200 mg Oral Daily  . amoxicillin-clavulanate  1 tablet Oral Q12H  . aspirin EC  81 mg Oral Daily  . calcium carbonate  500 mg Oral Daily  . cholecalciferol  1,000 Units Oral Daily  . docusate sodium  100 mg Oral Daily  . enoxaparin (LOVENOX) injection  40 mg Subcutaneous Q24H  . feeding supplement (ENSURE ENLIVE)  237 mL Oral BID BM  . folic acid  1 mg Oral Daily  . furosemide  20 mg Oral Daily  . hydroxychloroquine  400 mg Oral Daily  . latanoprost  1 drop Both Eyes QHS  . metoprolol succinate  12.5 mg Oral Daily  . mirtazapine  30 mg Oral QHS  .  multivitamin with minerals  1 tablet Oral Daily  . potassium chloride  20 mEq Oral Daily  . pravastatin  40 mg Oral QHS  . predniSONE  2.5 mg Oral Q breakfast  . QUEtiapine  25 mg Oral QHS  . sodium chloride flush  3 mL Intravenous Q12H  . Vitamin D (Ergocalciferol)  50,000 Units Oral Q7 days   Continuous Infusions: . sodium chloride     PRN Meds: sodium chloride, acetaminophen, doxylamine (Sleep), HYDROcodone-acetaminophen, ondansetron (ZOFRAN) IV, sodium chloride flush  Time spent: 35 minutes  Author: Berle Mull, MD [facility] 12/03/2019 7:28 AM  To reach On-call, see care teams to locate the attending and reach out to them via [email or web address]. If 7PM-7AM, please contact night-coverage If you still have difficulty reaching the attending provider, please page the [facility] (Director on Call) for [facility] on amion for assistance.

## 2019-12-03 NOTE — Progress Notes (Signed)
Pt resting quietly with eyes closed.  Resp even and unlabored.  No observed needs at this time.

## 2019-12-03 NOTE — Progress Notes (Signed)
Physical Therapy Treatment Patient Details Name: Tina Salinas MRN: [id number] DOB: 07/05/1946 Today's Date: 12/03/2019    History of Present Illness Pt is a 74 y.o. 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 fib on amiodarone, not on anticoagulation due to fall risk, RA on methotrexate and chronic prednisone who presents to the emergency room with a couple of days of not feeling well and today with sob and wheezing.  Denies chest pain, fever, chills.  She was found with increased respiratory effort with O2 sats ranging from 88 to 94% and patient was placed on 2 L nasal cannula had comfort in the ER.    PT Comments    Pt alert, attempting to eat breakfast, PT offered to reposition pt and come back later but pt very eager for PT and requested to do PT and defer breakfast. Several supine LE exercises performed, and pt encouraged to move LE/perform exercises as much as possible during the day. Bed linens dirty, tech informed and in room to assist/change linens as able. Supine to sit with heavy use of bed rails, pt eventually needed modA to come to full sitting. Throughout session pt adamant about performing as much as possible independently. Seated balance fair-poor, with fatigue pt with increased posterior lean and kyphotic posture. Intermittently able to perform with CGA/supervision with bilateral UE propping. Sit <> Stand performed three times. maxA1-2 assist with RW, pt unable to come to full standing position, limited by decreased TKE bilaterally and weakness. Returned to supine minA and repositioned. At end of session pt and PT discussed POC and PT role, pt requesting to be seen by PT twice a day, every day. Pt encouraged to perform exercises as able and PT will see her as much as possible. Pt then asked PT who could see this writers notes, often mentioned she doesn't want  "them, the nurses" to see the notes, just the doctors. She verbalized concern over people being able to read about her and her conditions, PT re-oriented pt as able and educated on HIPPA. The patient would benefit from further skilled PT intervention to continue to progress towards goals. Recommendation remains appropriate.     Follow Up Recommendations  SNF     Equipment Recommendations  Other (comment)(TBD at next venue of care)    Recommendations for Other Services       Precautions / Restrictions Precautions Precautions: Fall Restrictions Weight Bearing Restrictions: No Other Position/Activity Restrictions: Pt has specific orthotic shoes she prefers to wear for any OOB mobility attempts    Mobility  Bed Mobility Overal bed mobility: Needs Assistance Bed Mobility: Supine to Sit;Sit to Supine     Supine to sit: Mod assist;HOB elevated Sit to supine: Min assist   General bed mobility comments: Pt relied heavily on bed rail, ultimately needed modA to come to seated position, able to return to supine with minA. 2+ for repositioning in bed due to pt weakness/fatigue  Transfers Overall transfer level: Needs assistance   Transfers: Sit to/from Stand Sit to Stand: Max assist;From elevated surface;+2 safety/equipment         General transfer comment: performed x3. Shoes donned prior to attempts. Pt with fatigue after each attempt, eventually maxA to clear buttocks from bed. Very limited knee extension noted bilaterally  Ambulation/Gait             General Gait Details: deferred   Stairs  Wheelchair Mobility    Modified Rankin (Stroke Patients Only)       Balance Overall balance assessment: Needs assistance Sitting-balance support: Bilateral upper extremity supported Sitting balance-Leahy Scale: Fair Sitting balance - Comments: pt able to maintain balance ~5-8 minutes. Initially modA, majority of time with CGA/supervision with bilateral UE  propped, very kyphotic                                    Cognition Arousal/Alertness: Awake/alert Behavior During Therapy: WFL for tasks assessed/performed Overall Cognitive Status: Within Functional Limits for tasks assessed                                 General Comments: Pt alert and oriented, but very anxious about current state, fear of falling, and not receiving therapy twice a day. Adamant about performing mobility independently as possible.      Exercises General Exercises - Lower Extremity Heel Slides: AROM;Strengthening;Left;10 reps;AAROM;Right Hip ABduction/ADduction: AAROM;Strengthening;Right;10 reps;AROM;Left Other Exercises Other Exercises: Pt instructed in bed rail pulls (goal to clear back from bed and utilize core to encourage trunk strength) as well as continued attempts to perform therapeutic exercises. Pt adamant about receiving PT more often, educated on PT role, POC and informed she would be seen as much as possible.    General Comments        Pertinent Vitals/Pain Pain Assessment: Faces Faces Pain Scale: No hurt    Home Living                      Prior Function            PT Goals (current goals can now be found in the care plan section) Progress towards PT goals: Progressing toward goals    Frequency    Min 2X/week      PT Plan Discharge plan needs to be updated    Co-evaluation              AM-PAC PT "6 Clicks" Mobility   Outcome Measure  Help needed turning from your back to your side while in a flat bed without using bedrails?: Total Help needed moving from lying on your back to sitting on the side of a flat bed without using bedrails?: Total Help needed moving to and from a bed to a chair (including a wheelchair)?: Total Help needed standing up from a chair using your arms (e.g., wheelchair or bedside chair)?: Total Help needed to walk in hospital room?: Total Help needed climbing 3-5  steps with a railing? : Total 6 Click Score: 6    End of Session Equipment Utilized During Treatment: Oxygen Activity Tolerance: Patient limited by lethargy Patient left: in bed;with bed alarm set;with call bell/phone within reach Nurse Communication: Mobility status PT Visit Diagnosis: Muscle weakness (generalized) (M62.81);Difficulty in walking, not elsewhere classified (R26.2);Other abnormalities of gait and mobility (R26.89)     Time: [id number] PT Time Calculation (min) (ACUTE ONLY): 40 min  Charges:  $Therapeutic Exercise: 38-52 mins                     Lieutenant Diego PT, DPT 11:21 AM,12/03/19

## 2019-12-04 DIAGNOSIS — I5023 Acute on chronic systolic (congestive) heart failure: Secondary | ICD-10-CM

## 2019-12-04 LAB — BASIC METABOLIC PANEL
Anion gap: 7 (ref 5–15)
BUN: 19 mg/dL (ref 8–23)
CO2: 25 mmol/L (ref 22–32)
Calcium: 8.3 mg/dL — ABNORMAL LOW (ref 8.9–10.3)
Chloride: 108 mmol/L (ref 98–111)
Creatinine, Ser: 1.1 mg/dL — ABNORMAL HIGH (ref 0.44–1.00)
GFR calc Af Amer: 58 mL/min — ABNORMAL LOW (ref >60)
GFR calc non Af Amer: 50 mL/min — ABNORMAL LOW (ref >60)
Glucose, Bld: 93 mg/dL (ref 70–99)
Potassium: 4.1 mmol/L (ref 3.5–5.1)
Sodium: 140 mmol/L (ref 135–145)

## 2019-12-04 LAB — MAGNESIUM: Magnesium: 2.4 mg/dL (ref 1.7–2.4)

## 2019-12-04 LAB — CBC
HCT: 32.2 % — ABNORMAL LOW (ref 36.0–46.0)
Hemoglobin: 9.9 g/dL — ABNORMAL LOW (ref 12.0–15.0)
MCH: 30.2 pg (ref 26.0–34.0)
MCHC: 30.7 g/dL (ref 30.0–36.0)
MCV: 98.2 fL (ref 80.0–100.0)
Platelets: 290 10*3/uL (ref 150–400)
RBC: 3.28 MIL/uL — ABNORMAL LOW (ref 3.87–5.11)
RDW: 16.4 % — ABNORMAL HIGH (ref 11.5–15.5)
WBC: 5.9 10*3/uL (ref 4.0–10.5)
nRBC: 0 % (ref 0.0–0.2)

## 2019-12-04 MED ORDER — FE FUMARATE-B12-VIT C-FA-IFC PO CAPS: 1.0000 | ORAL_CAPSULE | Freq: Two times a day (BID) | ORAL | 0 refills | Status: AC

## 2019-12-04 MED ORDER — DOCUSATE SODIUM 100 MG PO CAPS: 100.0000 mg | ORAL_CAPSULE | Freq: Every day | ORAL | 0 refills | Status: AC

## 2019-12-04 MED ORDER — OCUVITE-LUTEIN PO CAPS
1.0000 | ORAL_CAPSULE | Freq: Every day | ORAL | Status: DC
  Administered 2019-12-04: 1 via ORAL
  Filled 2019-12-04 (×2): qty 1

## 2019-12-04 NOTE — Progress Notes (Signed)
Daily Progress Note   Patient Name: Tina Salinas       Date: 12/04/2019 DOB: [birth year]  Age: 74 y.o. MRN#: [id number] Attending Physician: Lavina Hamman, MD Primary Care Physician: Birdie Sons, MD Admit Date: 11/28/2019  Reason for Consultation/Follow-up: Establishing goals of care  Subjective: Patient sitting upright in bed eating lunch. Denies pain or shortness of breath. Watching TV. States she is ready to leave the hospital. She has been evaluated by Psychiatry and deemed competent in her decision making. She is A&O x3 in conversation. Follows commands. Mood appropriate.   Expresses appreciation for detailed conversation yesterday with her friend, [name] present. Shares she is ready to "get better" so she can return home. Says she wants to go home but knows she is weak and will need some support and therapy. Is asking if physical therapy is planning to work with her today. Also asking about "the list" which she clarifies she is referring to the list of available facilities she could potentially go to for rehab. She is aware CSW will notify her of available facilities. She is aware she is unable to return to [facility] and says she accepts that but is not willing to go "just anywhere!"   We discussed outpatient palliative support which she politely declines.   She does confirms goals for continued medical treatment while hospitalized. She states she would not want heroic measures such as CPR, defibrillation, life support, artificial feedings/PEG. Stating "if the time comes and I need any of those things then I am ready to go, just leave me alone and let me go!" Support given. I again attempt to educate her regarding the MOST form and it's completion to express her wishes. She declines stating as she did previous days that she did not want to sign or place anything in writing in fear of someone who should not see her records being able to see her wishes to gain access to her finances and also  making changes. She does not wish for a DNR bracelet to be place don her in again fear of the "wrong person or staff member seeing her wishes!" Unsure her reasoning behind this and have educated her that all hospital team members that have access to her information are only the ones involved in her direct care and have her best interest in mind. She verbalized understanding and appreciation. Sharing she knows my intentions but did not trust "others!"   She continues to state she needs therapy with a goal of returning home soon.   All questions answered.Support given.   Length of Stay: 6 days  Vital Signs: BP 98/62 (BP Location: Right Arm)   Pulse 71   Temp 97.6 F (36.4 C) (Oral)   Resp 16   Ht 5\' 1"  (1.549 m)   Wt 41.5 kg   SpO2 98%   BMI 17.31 kg/m  SpO2: SpO2: 98 % O2 Device: O2 Device: Room Air O2 Flow Rate: O2 Flow Rate (L/min): 2 L/min  Intake/output summary:   Intake/Output Summary (Last 24 hours) at 12/04/2019 1312 Last data filed at 12/04/2019 0945 Gross per 24 hour  Intake 363 ml  Output 900 ml  Net -537 ml   LBM:   Baseline Weight: Weight: 54 kg Most recent weight: Weight: 41.5 kg  Physical Exam: -awake, A&O x3, chronically-ill appearing, thin -normal breathing pattern -answers questions appropriately, follows commands       Palliative Care Assessment & Plan    Code Status:  Full  code-although patient expresses wishes for DNR, she is not willing to wear band and request for no documentation to be completed in fear of others gaining access to her information and finances. Will not change code status due to uncertainty.   Goals of Care:  Continue with current plan of care per medical team  Patient states she knows she is weak and in need of support. Is requesting list of acceptable facilities for rehab, sharing she will not go "just anywhere".   Declines outpatient palliative  Prognosis: Guarded to Poor   Discharge Planning: To Be Determined  Thank  you for allowing the Palliative Medicine Team to assist in the care of this patient.  Time Total: 40 min.   Visit consisted of counseling and education dealing with the complex and emotionally intense issues of symptom management and palliative care in the setting of serious and potentially life-threatening illness.Greater than 50%  of this time was spent counseling and coordinating care related to the above assessment and plan.  Alda Lea, AGPCNP-BC  Palliative Medicine Team [phone]

## 2019-12-04 NOTE — TOC Progression Note (Signed)
Transition of Care [facility]) - Progression Note    Patient Details  Name: Tina Salinas MRN: [id number] Date of Birth: 03-Jun-1946  Transition [facility] CM/SW [place], RN Phone Number: 12/04/2019, 1:55 PM  Clinical Narrative:     Patient has been cleared by Psych to make her own decisions.  Patient still unable to make a clear decision and becomes argumentative when asked to make a decision.  I heard her tell psych she wanted to go home this morning, however nursing just called and said she would like to know where she is going.   I met with patient and gave her options available.  Patient spoke to [place] at [facility] and said she is sending a friend to meet with her.  [facility] has started [place] with [employer].    Expected Discharge Plan: [place] Barriers to Discharge: Continued Medical Work up  Expected Discharge Plan and Services Expected Discharge Plan: [place] Choice: [place] arrangements for the past 2 months: Single Family Home                                       Social Determinants of Health (SDOH) Interventions    Readmission Risk Interventions No flowsheet data found.

## 2019-12-04 NOTE — Progress Notes (Signed)
Nutrition Follow-up  DOCUMENTATION CODES:   Severe malnutrition in context of chronic illness  INTERVENTION:   Vital Cuisine BID, each supplement provides 520kcal and 22g of protein.   Magic cup TID with meals, each supplement provides 290 kcal and 9 grams of protein  Ocuvite daily for wound healing (provides zinc, vitamin A, vitamin C, Vitamin E, copper, and selenium)  Recommend liberalize diet   NUTRITION DIAGNOSIS:   Severe Malnutrition related to chronic illness(cerebral palsy) as evidenced by increased severe fat depletion, severe muscle depletion.  GOAL:   Patient will meet greater than or equal to 90% of their needs  MONITOR:   PO intake, Supplement acceptance, Labs, Weight trends, Skin, I & O's  ASSESSMENT:   74 yo female admitted with SOB and wheezing r/t CHF. PMH includes cerebral palsy, CKD 3, CAD, HF, PAF, RA, vitamin D deficiency.   Pt reports improved appetite and oral intake in hospital; pt documented to be eating anywhere from sips/bites to 100% of meals. Pt is refusing Ensure supplements; RD will discontinue and add supplements to meal trays. RD will also add ocuvite to support wound healing. Per chart, pt is weight stable.   Medications reviewed and include: augmentin, aspirin, tums, D3, colace, lovenox, trinsicon/foltrin, remeron, prednisone, ergocalciferol  Labs reviewed: creat 1.10(H) Hgb 9.9(L), Hct 32.2(L)  NUTRITION - FOCUSED PHYSICAL EXAM:    Most Recent Value  Orbital Region  Severe depletion  Upper Arm Region  Severe depletion  Thoracic and Lumbar Region  Severe depletion  Buccal Region  Severe depletion  Temple Region  Severe depletion  Clavicle Bone Region  Severe depletion  Clavicle and Acromion Bone Region  Severe depletion  Scapular Bone Region  Severe depletion  Dorsal Hand  Severe depletion  Patellar Region  Severe depletion  Anterior Thigh Region  Severe depletion  Posterior Calf Region  Severe depletion  Edema (RD Assessment)   None  Hair  Reviewed  Eyes  Reviewed  Mouth  Reviewed  Skin  Reviewed  Nails  Reviewed     Diet Order:   Diet Order            Diet 2 gram sodium Room service appropriate? Yes; Fluid consistency: Thin  Diet effective now             EDUCATION NEEDS:   Education needs have been addressed  Skin:  Skin Assessment: Reviewed RN Assessment(Stage II coccyx)  Last BM:  5/16- type 6  Height:   Ht Readings from Last 1 Encounters:  11/28/19 5\' 1"  (1.549 m)    Weight:   Wt Readings from Last 1 Encounters:  12/04/19 41.5 kg    Ideal Body Weight:  47.7 kg  BMI:  Body mass index is 17.31 kg/m.  Estimated Nutritional Needs:   Kcal:  1300-1500  Protein:  65-75 gm  Fluid:  1.5 L  Koleen Distance MS, RD, LDN Please refer to [facility] for RD and/or RD on-call/weekend/after hours pager

## 2019-12-04 NOTE — Plan of Care (Signed)
°  Problem: Coping: °Goal: Level of anxiety will decrease °Outcome: Progressing °  °

## 2019-12-04 NOTE — Progress Notes (Signed)
OT Cancellation Note  Patient Details Name: BIANICA GOMBAR MRN: [id number] DOB: 06-Sep-1945   Cancelled Treatment:    Reason Eval/Treat Not Completed: Other (comment). Pt eating breakfast with nurse in room. Will re-attempt OT tx at later time as pt is available and appropriate.   Jeni Salles, MPH, MS, OTR/L ascom [phone] 12/04/19, 9:25 AM

## 2019-12-04 NOTE — Consult Note (Signed)
[place] [facility] Psychiatry Consult   Reason for Consult:  Capacity and general psych issues  Referring Physician:  Dr. Berle Mull  Patient Identification: Tina Salinas MRN:  [id number]  Principal Diagnosis:  Adjustment disorder   Diagnosis:  Active Problems:   PAF (paroxysmal atrial fibrillation) (HCC)   CHF (congestive heart failure) ([place])   Pressure injury of skin  Adjustment disorder   Total Time spent with patient:   40-60 minutes  Subjective:   Tina Salinas is a 74 y.o. female patient admitted with congenstive heart failure now resolved.  She is scheduled for discharge today.  Concern that she is going home but does she have capacity?  Came from Rehab hospital where they do not want to have her back. She went there for deconditioning.   Now stable, she wants to go home  Staff et al concerned about her MS and if she can handle being at home alone in general , and to check on any psych symptoms and issues   HPI:      She has no previous psychiatry history --no history of depression, bipolar disorder, generalized anxiety ----substance drug or ETOH issues.  She contracts for safety   She wants to be back home    Past Psychiatric History:   Risk to Self:  None Risk to Others:  None Prior Inpatient Therapy:  none Prior Outpatient Therapy:  none  Past Medical History:  Past Medical History:  Diagnosis Date  . Adenoma   . CAP (community acquired pneumonia) 11/16/2017  . Cerebral palsy ([place])   . CHF (congestive heart failure) ([place])   . Femur fracture ([place])   . History of blood transfusion   . Hypertension   . Osteoarthritis   . Osteoporosis   . Rheumatoid arthritis [facility])     Past Surgical History:  Procedure Laterality Date  . BREAST ENHANCEMENT SURGERY Bilateral 1980  . COLONOSCOPY    . COLONOSCOPY WITH PROPOFOL N/A 06/18/2019   Procedure: COLONOSCOPY WITH PROPOFOL;  Surgeon: Robert Bellow, MD;  Location: [facility] ENDOSCOPY;  Service: Endoscopy;  Laterality: N/A;  .  CORONARY ANGIOPLASTY WITH STENT PLACEMENT  07/13/2008, 08/19/2008  . ESOPHAGOGASTRODUODENOSCOPY (EGD) WITH PROPOFOL N/A 06/18/2019   Procedure: ESOPHAGOGASTRODUODENOSCOPY (EGD) WITH PROPOFOL;  Surgeon: Robert Bellow, MD;  Location: [facility] ENDOSCOPY;  Service: Endoscopy;  Laterality: N/A;  . HIP ARTHROPLASTY N/A 2000  . JOINT REPLACEMENT    . LEFT HEART CATH AND CORONARY ANGIOGRAPHY N/A 05/16/2017   Procedure: LEFT HEART CATH AND CORONARY ANGIOGRAPHY;  Surgeon: Teodoro Spray, MD;  Location: [place] CV LAB;  Service: Cardiovascular;  Laterality: N/A;  . TOTAL KNEE ARTHROPLASTY Left 09/2007, 01/2008   Family History:   No family history of Psych issues  No current ETOH issues  Family History  Problem Relation Age of Onset  . Alcohol abuse Father   . Raynaud syndrome Sister    Family Psychiatric  History:  No psych history    Social History:   Lives alone ---no kids, no next of kin----She prefers being on her own   She has two friends ---who help take care of her --She is open to home PT and home Medicare Nursing ---She realizes she needs Exercises at home and is willing to have medicare nursing as well. 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  Has no dog or care dog or pets ---       Social History   Substance and Sexual Activity  Alcohol Use No     Social History   Substance and Sexual Activity  Drug Use No    Social History   Socioeconomic History  . Marital status: Widowed    Spouse name: Not on file  . Number of children: Not on file  . Years of education: Not on file  . Highest education level: Not on file  Occupational History  . Not on file  Tobacco Use  . Smoking status: Never Smoker  . Smokeless tobacco: Never Used  Substance and Sexual Activity  . Alcohol use: No  . Drug use: No  . Sexual activity: Not on file  Other Topics Concern  . Not on file  Social History Narrative  . Not on file   Social Determinants of  Health   Financial Resource Strain:   . Difficulty of Paying Living Expenses:   Food Insecurity:   . Worried About Charity fundraiser in the Last Year:   . Arboriculturist in the Last Year:   Transportation Needs:   . Film/video editor (Medical):   Marland Kitchen Lack of Transportation (Non-Medical):   Physical Activity:   . Days of Exercise per Week:   . Minutes of Exercise per Session:   Stress:   . Feeling of Stress :   Social Connections:   . Frequency of Communication with Friends and Family:   . Frequency of Social Gatherings with Friends and Family:   . Attends Religious Services:   . Active Member of Clubs or Organizations:   . Attends Archivist Meetings:   Marland Kitchen Marital Status:    Additional Social History:  She has capacity --she understands the nature of her illness and the hows and why s of treatment     Allergies:   Allergies  Allergen Reactions  . Etodolac     Other reaction(s): Unknown  . Sulfa Antibiotics   . Tetracycline     Labs:  Results for orders placed or performed during the hospital encounter of 11/28/19 (from the past 48 hour(s))  Basic metabolic panel     Status: Abnormal   Collection Time: 12/03/19  5:39 AM  Result Value Ref Range   Sodium 141 135 - 145 mmol/L   Potassium 4.2 3.5 - 5.1 mmol/L   Chloride 108 98 - 111 mmol/L   CO2 26 22 - 32 mmol/L   Glucose, Bld 99 70 - 99 mg/dL    Comment: Glucose reference range applies only to samples taken after fasting for at least 8 hours.   BUN 14 8 - 23 mg/dL   Creatinine, Ser 0.89 0.44 - 1.00 mg/dL   Calcium 8.2 (L) 8.9 - 10.3 mg/dL   GFR calc non Af Amer >60 >60 mL/min   GFR calc Af Amer >60 >60 mL/min   Anion gap 7 5 - 15    Comment: Performed at [facility], [address] [place], [place] [id number]  Basic metabolic panel     Status: Abnormal   Collection Time: 12/04/19  5:33 AM  Result Value Ref Range   Sodium 140 135 - 145 mmol/L   Potassium 4.1 3.5 - 5.1 mmol/L   Chloride  108 98 - 111 mmol/L   CO2 25 22 - 32 mmol/L   Glucose, Bld 93 70 - 99 mg/dL    Comment: Glucose reference range applies only to samples taken after fasting for at least 8 hours.   BUN 19 8 - 23 mg/dL  Creatinine, Ser 1.10 (H) 0.44 - 1.00 mg/dL   Calcium 8.3 (L) 8.9 - 10.3 mg/dL   GFR calc non Af Amer 50 (L) >60 mL/min   GFR calc Af Amer 58 (L) >60 mL/min   Anion gap 7 5 - 15    Comment: Performed at [facility], [address] [place], [place] [address]  CBC     Status: Abnormal   Collection Time: 12/04/19  5:33 AM  Result Value Ref Range   WBC 5.9 4.0 - 10.5 K/uL   RBC 3.28 (L) 3.87 - 5.11 MIL/uL   Hemoglobin 9.9 (L) 12.0 - 15.0 g/dL   HCT 32.2 (L) 36.0 - 46.0 %   MCV 98.2 80.0 - 100.0 fL   MCH 30.2 26.0 - 34.0 pg   MCHC 30.7 30.0 - 36.0 g/dL   RDW 16.4 (H) 11.5 - 15.5 %   Platelets 290 150 - 400 K/uL   nRBC 0.0 0.0 - 0.2 %    Comment: Performed at [facility], [address] [place], [place] [address]  Magnesium     Status: None   Collection Time: 12/04/19  5:33 AM  Result Value Ref Range   Magnesium 2.4 1.7 - 2.4 mg/dL    Comment: Performed at [facility], [address], [place], [place] [address]    Current Facility-Administered Medications  Medication Dose Route Frequency Provider Last Rate Last Admin  . 0.9 %  sodium chloride infusion  250 mL Intravenous PRN Nolberto Hanlon, MD      . acetaminophen (TYLENOL) tablet 650 mg  650 mg Oral Q4H PRN Oswald Hillock, RPH      . amiodarone (PACERONE) tablet 200 mg  200 mg Oral Daily Nolberto Hanlon, MD   200 mg at 12/04/19 0916  . amoxicillin-clavulanate (AUGMENTIN) 875-125 MG per tablet 1 tablet  1 tablet Oral Q12H Oswald Hillock, RPH   1 tablet at 12/04/19 [id number]  . aspirin EC tablet 81 mg  81 mg Oral Daily Nolberto Hanlon, MD   81 mg at 12/04/19 0916  . calcium carbonate (TUMS - dosed in mg elemental calcium) chewable tablet 500 mg  500 mg Oral Daily Nolberto Hanlon, MD   500 mg at 12/04/19 0916  .  cholecalciferol (VITAMIN D3) tablet 1,000 Units  1,000 Units Oral Daily Nolberto Hanlon, MD   1,000 Units at 12/04/19 0916  . docusate sodium (COLACE) capsule 100 mg  100 mg Oral Daily Lang Snow, NP   100 mg at 12/02/19 0914  . enoxaparin (LOVENOX) injection 40 mg  40 mg Subcutaneous Q24H Nolberto Hanlon, MD   40 mg at 12/03/19 2109  . ferrous Q000111Q C-folic acid (TRINSICON / FOLTRIN) capsule 1 capsule  1 capsule Oral BID PC Lavina Hamman, MD   1 capsule at 12/04/19 0916  . HYDROcodone-acetaminophen (NORCO) 10-325 MG per tablet 1 tablet  1 tablet Oral Q6H PRN Lang Snow, NP   1 tablet at 12/04/19 0916  . hydroxychloroquine (PLAQUENIL) tablet 400 mg  400 mg Oral Daily Nolberto Hanlon, MD   400 mg at 12/04/19 0916  . latanoprost (XALATAN) 0.005 % ophthalmic solution 1 drop  1 drop Both Eyes QHS Nolberto Hanlon, MD   1 drop at 12/03/19 2131  . mirtazapine (REMERON) tablet 30 mg  30 mg Oral QHS Nolberto Hanlon, MD   30 mg at 12/03/19 2109  . multivitamin-lutein (OCUVITE-LUTEIN) capsule 1 capsule  1 capsule Oral Daily Lavina Hamman, MD      .  ondansetron (ZOFRAN) injection 4 mg  4 mg Intravenous Q6H PRN Nolberto Hanlon, MD      . pravastatin (PRAVACHOL) tablet 40 mg  40 mg Oral QHS Nolberto Hanlon, MD   40 mg at 12/03/19 2117  . predniSONE (DELTASONE) tablet 2.5 mg  2.5 mg Oral Q breakfast Nolberto Hanlon, MD   2.5 mg at 12/04/19 0916  . QUEtiapine (SEROQUEL) tablet 25 mg  25 mg Oral QHS Lang Snow, NP   25 mg at 12/03/19 2109  . sodium chloride flush (NS) 0.9 % injection 3 mL  3 mL Intravenous Q12H Nolberto Hanlon, MD   3 mL at 12/04/19 0916  . sodium chloride flush (NS) 0.9 % injection 3 mL  3 mL Intravenous PRN Nolberto Hanlon, MD   3 mL at 12/02/19 2141  . Vitamin D (Ergocalciferol) (DRISDOL) capsule 50,000 Units  50,000 Units Oral Q7 days Nolberto Hanlon, MD   50,000 Units at 11/30/19 0945    Musculoskeletal:  Per PT    Strength & Muscle Tone:  Needs deconditioning PT  at home     [place]:  She has difficulty needs walker and feels week --   Patient leans: not known Psychiatric Specialty Exam: Physical Exam  Review of Systems  Blood pressure 115/66, pulse 69, temperature 97.6 F (36.4 C), temperature source Oral, resp. rate 20, height 5\' 1"  (1.549 m), weight 41.5 kg, SpO2 100 %.Body mass index is 17.31 kg/m.  General Appearance: thin   Eye Contact:  Normal   Speech:  normal  Volume:  normal  Mood:  normal  Affect:  normal  Thought Process:  Intact   Orientation:  Oriented times four   Thought Content:  Wants to go home   Suicidal Thoughts:  none  Homicidal Thoughts:  none  Memory:  3/3 obj okay, does serial 7's no other new or old memory issues ---did serial numbers and presidents etc   Judgement:  Normal f  Insight:  Improving   Psychomotor Activity:  Normal   Concentration:  Normal attends well   Recall:  Intact   Fund of Knowledge:  Normal   Language:  normal  Akathisia:  none  Handed:    AIMS (if indicated):     Assets:  Home caregivers   ADL's:  Limited due to ROM and need for PT   Cognition:  Basically intact   Sleep:   normal at home      Treatment Plan Summary:  Caucasian female now ready for discharge she understands the nature of her illness and issues and the implications of treatment and not being treated.  She wants to go home and receive in home services.  She has no clear or new or old psych issues.  She needs help with PT at home --and ambulation and muscle strengthening    Disposition:   Home is okay ----she meets capacity issues   --She --knows she has to have more home care but she is not committable nor is she needing guardian at this time.     Currently she does not need any psych meds ---could benefit from geriatric in home Psychotherapy is she is open   Eulas Post, MD 12/04/2019 10:48 AM

## 2019-12-04 NOTE — Progress Notes (Signed)
[facility] Progress Note  Patient: Tina Salinas    [id number]  DOA: 11/28/2019     Date of Service: the patient was seen and examined on 12/04/2019  Chief Complaint  Patient presents with  . Shortness of Breath   Brief hospital course: Past medical history of CKD 3a, CAD, chronic combined CHF, paroxysmal A. fib, RA on methotrexate and prednisone, not around 8 anticoagulation due to fall risk, cerebral palsy.  Presents with shortness of breath found to have acute on chronic combined CHF.  Currently further plan is follow-up on TOC and patient discussion to arrange for safe discharge  Assessment and Plan: 1.  Acute on chronic combined systolic and diastolic CHF. Nonischemic cardiomyopathy. EF 20 to 25% Clinically euvolemic right now. Treated with IV Lasix.  Currently on p.o. Continue Toprol-XL. Entresto as outpatient as the BP allow. [place] cardiology consultation. Palliative care consulted given patient's poor compliance and prognosis.  2.  Community-acquired pneumonia Treated with IV antibiotics. Currently on oral antibiotics. Improving.  Monitor.  3.  Hypokalemia Replaced as needed. Cannot tolerate IV.  4.  Paroxysmal A. fib Continue amiodarone. Not on any anticoagulation due to fall risk  5.  CKD stage IIIa Renal function baseline function monitor with diuresis.  6.  Rheumatoid arthritis Continuing Plaquenil and prednisone on home dose. Holding methotrexate the setting of recent pneumonia. Resume on follow-up with PCP.  7.  Vitamin D deficiency. Continue vitamin D replacement weekly.  8.  Hyperlipidemia. Continue statin.  9.  Paranoid behavior.  Confusion. ?  Dementia. Appreciate psychiatry input. Patient does have capacity to make medical decisions although does not make appropriate decisions for her health. CT head March 2021.  Moderate to advanced periventricular white matter hypodensity, nonspecific but typically chronic small vessel  ischemia.  10.  Physical deconditioning. Unable to ambulate. Unable to transfer. Poor bilateral lower extremity strength based on PT evaluation. Unsafe to go home given her poor insight and paranoid behavior at present. SNF recommended on discharge by PT and OT. Case management consulted.  11.  Underweight. Body mass index is 17.31 kg/m.  Nutrition Problem: Severe Malnutrition Etiology: chronic illness(cerebral palsy) Interventions: Interventions: Magic cup, Hormel Shake, MVI   12.  Mid coccyx stage II ulcer.  POA. Continue foam dressing.  Pressure Injury 11/30/19 Coccyx Mid Stage 2 -  Partial thickness loss of dermis presenting as a shallow open injury with a red, pink wound bed without slough. patient states the wound was a stage three and is in healing process (Active)  11/30/19 1500  Location: Coccyx  Location Orientation: Mid  Staging: Stage 2 -  Partial thickness loss of dermis presenting as a shallow open injury with a red, pink wound bed without slough.  Wound Description (Comments): patient states the wound was a stage three and is in healing process  Present on Admission: Yes    13.  Microcytic anemia. Vitamin B12 deficiency. B12 in March 2021 118. H&H relatively stable. We will continue to transfuse as needed for hemoglobin less than 7. Continue to replace B12.  Add folic acid.  Diet: Carb modified diet DVT Prophylaxis: Subcutaneous Heparin    Advance goals of care discussion: Full code  Family Communication: no family was present at bedside, at the time of interview.   Disposition:  Status is: Inpatient  Remains inpatient appropriate because:Altered mental status and Unsafe d/c plan significantly confused and paranoid.  Unable to verify competency on clinical examination.  Psychiatric consult for further assistance.   Dispo: The  patient is from: Home              Anticipated d/c is to: SNF              Anticipated d/c date is: 1 day              Patient  currently is not medically stable to d/c.  Subjective: No nausea no vomiting no fever no chills.  No chest pain abdominal pain.  Physical Exam: General:  alert oriented to time, place, and person.  Appear in mild distress, affect emotionally labile Eyes: PERRL ENT: Oral Mucosa Clear, moist  Neck: no JVD,  Cardiovascular: S1 and S2 Present, no Murmur,  Respiratory: good respiratory effort, Bilateral Air entry equal and Decreased, bilateral  Crackles, no wheezes Abdomen: Bowel Sound present, Soft and no tenderness,  Skin: no rash Extremities: no Pedal edema, no calf tenderness Neurologic: without any new focal findings Gait not checked due to patient safety concerns  Vitals:   12/04/19 0331 12/04/19 0755 12/04/19 1141 12/04/19 1641  BP: (!) 101/57 115/66 98/62 (!) 93/58  Pulse: 67 69 71 74  Resp: 20  16 16   Temp: (!) 97.5 F (36.4 C) 97.6 F (36.4 C)    TempSrc: Oral Oral    SpO2: 100% 100% 98% 100%  Weight: 41.5 kg     Height:        Intake/Output Summary (Last 24 hours) at 12/04/2019 1931 Last data filed at 12/04/2019 1750 Gross per 24 hour  Intake 243 ml  Output 1250 ml  Net -1007 ml   Filed Weights   12/02/19 0544 12/03/19 0410 12/04/19 0331  Weight: 40.6 kg 41.5 kg 41.5 kg    Data Reviewed: I have personally reviewed and interpreted daily labs, tele strips, imagings as discussed above. I reviewed all nursing notes, pharmacy notes, vitals, pertinent old records I have discussed plan of care as described above with RN and patient/family.  CBC: Recent Labs  Lab 11/28/19 1400 12/04/19 0533  WBC 10.2 5.9  NEUTROABS 8.5*  --   HGB 9.7* 9.9*  HCT 31.4* 32.2*  MCV 97.8 98.2  PLT 250 Q000111Q   Basic Metabolic Panel: Recent Labs  Lab 11/30/19 0736 12/01/19 0538 12/02/19 0509 12/03/19 0539 12/04/19 0533  NA 141 140 138 141 140  K 2.9* 3.9 4.5 4.2 4.1  CL 107 105 107 108 108  CO2 25 24 25 26 25   GLUCOSE 90 88 97 99 93  BUN 20 17 17 14 19   CREATININE 0.91  0.94 0.86 0.89 1.10*  CALCIUM 7.7* 7.8* 7.9* 8.2* 8.3*  MG 2.0  --   --   --  2.4    Studies: No results found.  Scheduled Meds: . amiodarone  200 mg Oral Daily  . amoxicillin-clavulanate  1 tablet Oral Q12H  . aspirin EC  81 mg Oral Daily  . calcium carbonate  500 mg Oral Daily  . cholecalciferol  1,000 Units Oral Daily  . docusate sodium  100 mg Oral Daily  . enoxaparin (LOVENOX) injection  40 mg Subcutaneous Q24H  . ferrous Q000111Q C-folic acid  1 capsule Oral BID PC  . hydroxychloroquine  400 mg Oral Daily  . latanoprost  1 drop Both Eyes QHS  . mirtazapine  30 mg Oral QHS  . multivitamin-lutein  1 capsule Oral Daily  . pravastatin  40 mg Oral QHS  . predniSONE  2.5 mg Oral Q breakfast  . QUEtiapine  25 mg Oral QHS  .  sodium chloride flush  3 mL Intravenous Q12H  . Vitamin D (Ergocalciferol)  50,000 Units Oral Q7 days   Continuous Infusions: . sodium chloride     PRN Meds: sodium chloride, acetaminophen, HYDROcodone-acetaminophen, ondansetron (ZOFRAN) IV, sodium chloride flush  Time spent: 35 minutes  Author: Berle Mull, MD [facility] 12/04/2019 7:31 PM  To reach On-call, see care teams to locate the attending and reach out to them via [email or web address]. If 7PM-7AM, please contact night-coverage If you still have difficulty reaching the attending provider, please page the [facility] (Director on Call) for [facility] on amion for assistance.

## 2019-12-04 NOTE — Progress Notes (Signed)
Occupational Therapy Treatment Patient Details Name: Tina Salinas MRN: [id number] DOB: 14-Jan-1946 Today's Date: 12/04/2019    History of present illness Pt is a 74 y.o. 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 fib on amiodarone, not on anticoagulation due to fall risk, RA on methotrexate and chronic prednisone who presents to the emergency room with a couple of days of not feeling well and today with sob and wheezing.  Denies chest pain, fever, chills.  She was found with increased respiratory effort with O2 sats ranging from 88 to 94% and patient was placed on 2 L nasal cannula had comfort in the ER.   OT comments  Pt seen for OT tx this date. Pt expressed frustration with not receiving therapy daily while in the hospital and upset with current situation with insurance. Active listening provided. Pt educated in benefits of bed mobility and sitting EOB to improve strength and activity tolerance for ADL and out of bed attempts. Pt verbalized understanding, requiring intermittent encouragement and reiteration of benefits to dynamic sitting balance exercises and positioning EOB to improve posture and prevent LOB while also addressing fear of falling. Pt ultimately felt too fearful of falling to attempt standing this date. Pt educated in bed level BUE exercises to perform to improve strength for ADL and mobility. Pt verbalized understanding. Continue to recommend continued therapy at SNF at discharge. Currently requires mod-max assist for bed mobility and significant assist for any OOB, bathing, dressing, and toileting tasks. Unsafe to return home at this time given deficits, functional impairments, and lack of assist available at home.   Follow Up Recommendations  SNF    Equipment Recommendations  Wheelchair (measurements OT);Wheelchair cushion (measurements OT)     Recommendations for Other Services      Precautions / Restrictions Precautions Precautions: Fall Precaution Comments: very fearful of falling Restrictions Weight Bearing Restrictions: No Other Position/Activity Restrictions: Pt has specific orthotic shoes she prefers to wear for any OOB mobility attempts       Mobility Bed Mobility Overal bed mobility: Needs Assistance Bed Mobility: Supine to Sit;Sit to Supine     Supine to sit: Mod assist;HOB elevated Sit to supine: Max assist   General bed mobility comments: Mod A to complete EOB for trunk elevation after significant effort and time pt ultimately allowed therapist to assist  Transfers                 General transfer comment: ultimately pt too fearful to attempt    Balance Overall balance assessment: Needs assistance Sitting-balance support: Bilateral upper extremity supported Sitting balance-Leahy Scale: Poor Sitting balance - Comments: Pt able to maintain balance ~1-3 minutes at a time, initially Min-Max A, requiring BUE support                                   ADL either performed or assessed with clinical judgement   ADL                                               Vision Baseline Vision/History: Wears glasses Wears Glasses: At all times Patient Visual Report: No change from baseline     Perception     Praxis  Cognition Arousal/Alertness: Awake/alert Behavior During Therapy: Agitated Overall Cognitive Status: Within Functional Limits for tasks assessed                                 General Comments: anxious, agitated and frustrated with situation, fearful of falling and of not returning to being independent        Exercises Other Exercises Other Exercises: Pt instructed in hip positioning while in sitting to improve sitting balance and security while sitting to address fear of falling, ultimately requiring several rest breaks with EOB  work to rest before sitting up again   Shoulder Instructions       General Comments      Pertinent Vitals/ Pain       Pain Assessment: No/denies pain  Home Living                                          Prior Functioning/Environment              Frequency  Min 2X/week        Progress Toward Goals  OT Goals(current goals can now be found in the care plan section)  Progress towards OT goals: OT to reassess next treatment  Acute Rehab OT Goals Patient Stated Goal: get stronger and stay as independent as possible OT Goal Formulation: With patient Time For Goal Achievement: 12/15/19 Potential to Achieve Goals: Good  Plan Discharge plan remains appropriate;Frequency remains appropriate    Co-evaluation                 AM-PAC OT "6 Clicks" Daily Activity     Outcome Measure   Help from another person eating meals?: None Help from another person taking care of personal grooming?: None Help from another person toileting, which includes using toliet, bedpan, or urinal?: A Lot Help from another person bathing (including washing, rinsing, drying)?: A Lot Help from another person to put on and taking off regular upper body clothing?: A Little Help from another person to put on and taking off regular lower body clothing?: A Lot 6 Click Score: 17    End of Session    OT Visit Diagnosis: Other abnormalities of gait and mobility (R26.89);Muscle weakness (generalized) (M62.81)   Activity Tolerance Patient tolerated treatment well   Patient Left in bed;with call bell/phone within reach;with bed alarm set   Nurse Communication          Time: [id number] OT Time Calculation (min): 53 min  Charges: OT General Charges $OT Visit: 1 Visit OT Treatments $Therapeutic Activity: 53-67 mins  Jeni Salles, MPH, MS, OTR/L ascom [phone] 12/04/19, 4:30 PM

## 2019-12-04 NOTE — Care Management Important Message (Signed)
Important Message  Patient Details  Name: Tina Salinas MRN: [id number] Date of Birth: 09-Jun-1946   Medicare Important Message Given:  Yes     [name] 12/04/2019, 12:51 PM

## 2019-12-05 DIAGNOSIS — Z515 Encounter for palliative care: Secondary | ICD-10-CM | POA: Diagnosis not present

## 2019-12-05 DIAGNOSIS — I5043 Acute on chronic combined systolic (congestive) and diastolic (congestive) heart failure: Secondary | ICD-10-CM | POA: Diagnosis not present

## 2019-12-05 DIAGNOSIS — Z7401 Bed confinement status: Secondary | ICD-10-CM | POA: Diagnosis not present

## 2019-12-05 DIAGNOSIS — R5381 Other malaise: Secondary | ICD-10-CM | POA: Diagnosis not present

## 2019-12-05 DIAGNOSIS — G809 Cerebral palsy, unspecified: Secondary | ICD-10-CM | POA: Diagnosis not present

## 2019-12-05 DIAGNOSIS — J9601 Acute respiratory failure with hypoxia: Secondary | ICD-10-CM | POA: Diagnosis not present

## 2019-12-05 DIAGNOSIS — E43 Unspecified severe protein-calorie malnutrition: Secondary | ICD-10-CM | POA: Diagnosis not present

## 2019-12-05 DIAGNOSIS — J189 Pneumonia, unspecified organism: Secondary | ICD-10-CM | POA: Diagnosis not present

## 2019-12-05 DIAGNOSIS — M810 Age-related osteoporosis without current pathological fracture: Secondary | ICD-10-CM | POA: Diagnosis not present

## 2019-12-05 DIAGNOSIS — D649 Anemia, unspecified: Secondary | ICD-10-CM | POA: Diagnosis not present

## 2019-12-05 DIAGNOSIS — M6281 Muscle weakness (generalized): Secondary | ICD-10-CM | POA: Diagnosis not present

## 2019-12-05 DIAGNOSIS — I959 Hypotension, unspecified: Secondary | ICD-10-CM | POA: Diagnosis not present

## 2019-12-05 DIAGNOSIS — M069 Rheumatoid arthritis, unspecified: Secondary | ICD-10-CM | POA: Diagnosis not present

## 2019-12-05 DIAGNOSIS — D519 Vitamin B12 deficiency anemia, unspecified: Secondary | ICD-10-CM | POA: Diagnosis not present

## 2019-12-05 DIAGNOSIS — I509 Heart failure, unspecified: Secondary | ICD-10-CM | POA: Diagnosis not present

## 2019-12-05 DIAGNOSIS — I429 Cardiomyopathy, unspecified: Secondary | ICD-10-CM | POA: Diagnosis not present

## 2019-12-05 DIAGNOSIS — D692 Other nonthrombocytopenic purpura: Secondary | ICD-10-CM | POA: Diagnosis not present

## 2019-12-05 DIAGNOSIS — I480 Paroxysmal atrial fibrillation: Secondary | ICD-10-CM | POA: Diagnosis not present

## 2019-12-05 DIAGNOSIS — M2550 Pain in unspecified joint: Secondary | ICD-10-CM | POA: Diagnosis not present

## 2019-12-05 DIAGNOSIS — N1831 Chronic kidney disease, stage 3a: Secondary | ICD-10-CM | POA: Diagnosis not present

## 2019-12-05 DIAGNOSIS — E559 Vitamin D deficiency, unspecified: Secondary | ICD-10-CM | POA: Diagnosis not present

## 2019-12-05 DIAGNOSIS — Z7189 Other specified counseling: Secondary | ICD-10-CM | POA: Diagnosis not present

## 2019-12-05 DIAGNOSIS — R0600 Dyspnea, unspecified: Secondary | ICD-10-CM | POA: Diagnosis not present

## 2019-12-05 LAB — BASIC METABOLIC PANEL
Anion gap: 9 (ref 5–15)
BUN: 22 mg/dL (ref 8–23)
CO2: 25 mmol/L (ref 22–32)
Calcium: 8.5 mg/dL — ABNORMAL LOW (ref 8.9–10.3)
Chloride: 106 mmol/L (ref 98–111)
Creatinine, Ser: 1.26 mg/dL — ABNORMAL HIGH (ref 0.44–1.00)
GFR calc Af Amer: 49 mL/min — ABNORMAL LOW (ref >60)
GFR calc non Af Amer: 43 mL/min — ABNORMAL LOW (ref >60)
Glucose, Bld: 89 mg/dL (ref 70–99)
Potassium: 3.8 mmol/L (ref 3.5–5.1)
Sodium: 140 mmol/L (ref 135–145)

## 2019-12-05 LAB — CBC
HCT: 31.6 % — ABNORMAL LOW (ref 36.0–46.0)
Hemoglobin: 9.7 g/dL — ABNORMAL LOW (ref 12.0–15.0)
MCH: 29.8 pg (ref 26.0–34.0)
MCHC: 30.7 g/dL (ref 30.0–36.0)
MCV: 96.9 fL (ref 80.0–100.0)
Platelets: 307 10*3/uL (ref 150–400)
RBC: 3.26 MIL/uL — ABNORMAL LOW (ref 3.87–5.11)
RDW: 16.5 % — ABNORMAL HIGH (ref 11.5–15.5)
WBC: 6.6 10*3/uL (ref 4.0–10.5)
nRBC: 0 % (ref 0.0–0.2)

## 2019-12-05 MED ORDER — FUROSEMIDE 20 MG PO TABS: 20.0000 mg | ORAL_TABLET | Freq: Every day | ORAL | 0 refills | Status: AC | PRN

## 2019-12-05 NOTE — Discharge Summary (Signed)
[facility] Discharge Summary   Patient: Tina Salinas [id number]  PCP: Birdie Sons, MD  Date of admission: 11/28/2019   Date of discharge:  12/05/2019     Discharge Diagnoses:  Principal diagnosis Acute on chronic diastolic heart failure  Active Problems:   PAF (paroxysmal atrial fibrillation) (HCC)   CHF (congestive heart failure) ([place])   Pressure injury of skin   Admitted From: home Disposition:  SNF   Recommendations for Outpatient Follow-up:  1. PCP: please follow up in 1 week 2. Follow up LABS/TEST:  Repeat BMP in 1 week  Follow-up Information    [facility] Follow up on 12/09/2019.   Specialty: Cardiology Why: at 2:00pm. Enter through the [place] entrance Contact information: [place] [phone]       Birdie Sons, MD. Schedule an appointment as soon as possible for a visit in 1 week(s).   Specialty: Family Medicine Contact information: [address] [place] [address] [phone]          Diet recommendation: Cardiac diet  Activity: The patient is advised to gradually reintroduce usual activities, as tolerated  Discharge Condition: stable  Code Status: Full code   History of present illness: As per the H and P dictated on admission, "Tina Salinas is a 74 y.o. 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 fib on amiodarone, not on anticoagulation due to fall risk, RA on methotrexate and chronic prednisone who presents to the emergency room with a couple of days of not feeling well and today with sob and wheezing.  Denies chest pain, fever, chills.  She was found with increased respiratory effort with O2 sats ranging from 88 to 94%  and patient was placed on 2 L nasal cannula had comfort in the ER."  Hospital Course:   Summary of her active problems in the hospital is as following. 1.  Acute on chronic combined systolic and diastolic CHF. Nonischemic cardiomyopathy. EF 20 to 25% Clinically euvolemic right now. Treated with IV Lasix.  Currently on p.o. PRN Continue Toprol-XL. Entresto as outpatient as the BP allow. [place] cardiology consultation. Palliative care consulted given patient's poor compliance and prognosis.  2.  Community-acquired pneumonia Treated with IV antibiotics. Currently on oral antibiotics. Improving.  Monitor.  3.  Hypokalemia Replaced, resolved  4.  Paroxysmal A. fib Continue amiodarone. Not on any anticoagulation due to fall risk  5.  CKD stage IIIa Renal function baseline function monitor with diuresis. Baseline s creat 1.2-1.3, on presentation 0.8 now back to baseline numbers 1.2  6.  Rheumatoid arthritis Continuing Plaquenil and prednisone on home dose. Holding methotrexate the setting of recent pneumonia. Resume on follow-up with PCP.  7.  Vitamin D deficiency. Continue vitamin D replacement weekly.  8.  Hyperlipidemia. Continue statin.  9.  Paranoid behavior.  Confusion. ?  Dementia. Appreciate psychiatry input. Patient does have capacity to make medical decisions although does not make appropriate decisions for her health. No indication to start any new psych meds per psych CT head March 2021.  Moderate to advanced periventricular white matter hypodensity, nonspecific but typically chronic small vessel ischemia.  10.  Physical deconditioning. Unable to ambulate. Unable to transfer. Poor bilateral lower extremity strength based on PT evaluation. Unsafe to go  home given her poor insight and paranoid behavior at present. SNF recommended on discharge by PT and OT. Case management consulted.  11.  Underweight. Body mass index is 17.31 kg/m.    Nutrition Problem: Severe Malnutrition Etiology: chronic illness(cerebral palsy) Interventions: Interventions: Magic cup, Hormel Shake, MVI   12.  Mid coccyx stage II ulcer.  POA. Continue foam dressing.  Pressure Injury 11/30/19 Coccyx Mid Stage 2 -  Partial thickness loss of dermis presenting as a shallow open injury with a red, pink wound bed without slough. patient states the wound was a stage three and is in healing process (Active)  11/30/19 1500  Location: Coccyx  Location Orientation: Mid  Staging: Stage 2 -  Partial thickness loss of dermis presenting as a shallow open injury with a red, pink wound bed without slough.  Wound Description (Comments): patient states the wound was a stage three and is in healing process  Present on Admission: Yes    13.  Microcytic anemia. Vitamin B12 deficiency. B12 in March 2021 118. H&H relatively stable. Continue to replace B12.  Add folic acid.   Patient was seen by physical therapy, who recommended SNF, which was arranged. On the day of the discharge the patient's vitals were stable, and no other acute medical condition were reported by patient. the patient was felt safe to be discharge at SNF with therapy.  Consultants: psychiatry, Palliative care  Procedures: none  Discharge Exam: General: Appear in no distress, no Rash; Oral Mucosa Clear, moist. Cardiovascular: S1 and S2 Present, no Murmur, Respiratory: normal respiratory effort, Bilateral Air entry present and no Crackles, no wheezes Abdomen: Bowel Sound present, Soft and no tenderness, no hernia Extremities: no Pedal edema, no calf tenderness Neurology: alert and oriented to time, place, and person affect flat in affect.  Filed Weights   12/03/19 0410 12/04/19 0331 12/05/19 0631  Weight: 41.5 kg 41.5 kg 31.3 kg   Vitals:   12/05/19 0633 12/05/19 0746  BP: (!) 100/55 (!) 100/55  Pulse: 67 70  Resp:  16  Temp:  97.9 F (36.6 C)  SpO2:  100%    DISCHARGE  MEDICATION: Allergies as of 12/05/2019      Reactions   Etodolac    Other reaction(s): Unknown   Sulfa Antibiotics    Tetracycline       Medication List    STOP taking these medications   carvedilol 3.125 MG tablet Commonly known as: COREG   cyproheptadine 4 MG tablet Commonly known as: PERIACTIN   folic acid 1 MG tablet Commonly known as: FOLVITE     TAKE these medications   alendronate 70 MG tablet Commonly known as: FOSAMAX Take 70 mg by mouth once a week.   amiodarone 200 MG tablet Commonly known as: PACERONE Take 200 mg by mouth daily.   aspirin 81 MG tablet Take 81 mg by mouth daily.   CALCIUM CARBONATE PO Take 200 mg by mouth daily.   cholecalciferol 1000 units tablet Commonly known as: VITAMIN D Take 1,000 Units by mouth daily.   docusate sodium 100 MG capsule Commonly known as: COLACE Take 1 capsule (100 mg total) by mouth daily.   ferrous [id number] C-folic acid capsule Commonly known as: TRINSICON / FOLTRIN Take 1 capsule by mouth 2 (two) times daily after a meal.   furosemide 20 MG tablet Commonly known as: Lasix Take 1 tablet (20 mg total) by mouth daily as needed for fluid or edema (weight gain of 3lbs in 1 day or 5  lbs in 3 days).   GNP FIBER THERAPY PO Take 1 Dose by mouth daily as needed (constipation).   hydroxychloroquine 200 MG tablet Commonly known as: PLAQUENIL Take 400 mg by mouth daily.   Methotrexate (PF) 25 MG/0.5ML Soaj Inject 1.4 mLs into the skin once a week.   mirtazapine 30 MG tablet Commonly known as: Remeron Take 1 tablet (30 mg total) by mouth at bedtime.   oxybutynin 5 MG tablet Commonly known as: DITROPAN TAKE 1 TABLET TWICE DAILY AS NEEDED  FOR  BLADDER  SPASMS What changed:   how much to take  how to take this  when to take this  reasons to take this  additional instructions   pravastatin 40 MG tablet Commonly known as: PRAVACHOL Take 1 tablet (40 mg total) by mouth at bedtime.    predniSONE 2.5 MG tablet Commonly known as: DELTASONE Take 2.5 mg by mouth daily with breakfast.   promethazine 25 MG tablet Commonly known as: PHENERGAN Take 25 mg by mouth every 6 (six) hours as needed for nausea or vomiting.   Vitamin D (Ergocalciferol) 1.25 MG (50000 UNIT) Caps capsule Commonly known as: DRISDOL Take 1 capsule (50,000 Units total) by mouth every 7 (seven) days.   Xalatan 0.005 % ophthalmic solution Generic drug: latanoprost Place 1 drop into both eyes at bedtime.      Allergies  Allergen Reactions  . Etodolac     Other reaction(s): Unknown  . Sulfa Antibiotics   . Tetracycline    Discharge Instructions    Amb Referral to Cardiac Rehabilitation   Complete by: As directed    Diagnosis: Heart Failure (see criteria below if ordering Phase II)   Heart Failure Type: Chronic Systolic & Diastolic   After initial evaluation and assessments completed: Virtual Based Care may be provided alone or in conjunction with Phase 2 Cardiac Rehab based on patient barriers.: Yes   Amb Referral to HF Clinic   Complete by: As directed    Diet - low sodium heart healthy   Complete by: As directed    Increase activity slowly   Complete by: As directed       The results of significant diagnostics from this hospitalization (including imaging, microbiology, ancillary and laboratory) are listed below for reference.    Significant Diagnostic Studies: DG Elbow 2 Views Left  Result Date: 12/01/2019 CLINICAL DATA:  Elbow swelling. EXAM: LEFT ELBOW - 2 VIEW COMPARISON:  None. FINDINGS: There is no evidence of fracture, dislocation, or joint effusion. There is no evidence of arthropathy or other focal bone abnormality. Soft tissues are unremarkable. IMPRESSION: Negative. Electronically Signed   By: Ulyses Jarred M.D.   On: 12/01/2019 03:33   DG Chest Port 1 View  Result Date: 11/28/2019 CLINICAL DATA:  Shortness of breath EXAM: PORTABLE CHEST 1 VIEW COMPARISON:  10/02/2019  FINDINGS: Patchy bilateral airspace disease again noted, improved in the right upper lobe but increasing in the right lower lobe and left perihilar region. Heart is mildly enlarged. Small left pleural effusion. No visible right effusion. No acute bony abnormality. IMPRESSION: Patchy bilateral airspace disease could reflect asymmetric edema or infection. Small left effusion. Electronically Signed   By: Rolm Baptise M.D.   On: 11/28/2019 13:35    Microbiology: Recent Results (from the past 240 hour(s))  Blood Culture (routine x 2)     Status: None   Collection Time: 11/28/19  2:00 PM   Specimen: BLOOD  Result Value Ref Range Status   Specimen Description  BLOOD LEFT ANTECUBITAL  Final   Special Requests   Final    BOTTLES DRAWN AEROBIC AND ANAEROBIC Blood Culture results may not be optimal due to an inadequate volume of blood received in culture bottles   Culture   Final    NO GROWTH 5 DAYS Performed at [facility], [address] [place], [place]  [address]    Report Status 12/03/2019 FINAL  Final  Blood Culture (routine x 2)     Status: None   Collection Time: 11/28/19  2:00 PM   Specimen: BLOOD  Result Value Ref Range Status   Specimen Description BLOOD LEFT ANTECUBITAL  Final   Special Requests   Final    BOTTLES DRAWN AEROBIC AND ANAEROBIC Blood Culture results may not be optimal due to an inadequate volume of blood received in culture bottles   Culture   Final    NO GROWTH 5 DAYS Performed at [facility], [address] [place], [place] [address]    Report Status 12/03/2019 FINAL  Final  SARS Coronavirus 2 by RT PCR (hospital order, performed in [facility] hospital lab) Nasopharyngeal Nasopharyngeal Swab     Status: None   Collection Time: 11/28/19  2:01 PM   Specimen: Nasopharyngeal Swab  Result Value Ref Range Status   SARS Coronavirus 2 NEGATIVE NEGATIVE Final    Comment: (NOTE) SARS-CoV-2 target nucleic acids are NOT DETECTED. The SARS-CoV-2 RNA is  generally detectable in upper and lower respiratory specimens during the acute phase of infection. The lowest concentration of SARS-CoV-2 viral copies this assay can detect is 250 copies / mL. A negative result does not preclude SARS-CoV-2 infection and should not be used as the sole basis for treatment or other patient management decisions.  A negative result may occur with improper specimen collection / handling, submission of specimen other than nasopharyngeal swab, presence of viral mutation(s) within the areas targeted by this assay, and inadequate number of viral copies (<250 copies / mL). A negative result must be combined with clinical observations, patient history, and epidemiological information. Fact Sheet for Patients:   StrictlyIdeas.no Fact Sheet for Healthcare Providers: BankingDealers.co.za This test is not yet approved or cleared  by the [place] FDA and has been authorized for detection and/or diagnosis of SARS-CoV-2 by FDA under an Emergency Use Authorization (EUA).  This EUA will remain in effect (meaning this test can be used) for the duration of the COVID-19 declaration under Section 564(b)(1) of the Act, 21 U.S.C. section 360bbb-3(b)(1), unless the authorization is terminated or revoked sooner. Performed at [facility], [address] [place], [place] [address]   MRSA PCR Screening     Status: None   Collection Time: 11/29/19  4:34 PM   Specimen: Nasal Mucosa; Nasopharyngeal  Result Value Ref Range Status   MRSA by PCR NEGATIVE NEGATIVE Final    Comment:        The GeneXpert MRSA Assay (FDA approved for NASAL specimens only), is one component of a comprehensive MRSA colonization surveillance program. It is not intended to diagnose MRSA infection nor to guide or monitor treatment for MRSA infections. Performed at [facility], [address] [place], [place] [address]       Labs: CBC: Recent Labs  Lab 11/28/19 1400 12/04/19 0533 12/05/19 0544  WBC 10.2 5.9 6.6  NEUTROABS 8.5*  --   --   HGB 9.7* 9.9* 9.7*  HCT 31.4* 32.2* 31.6*  MCV 97.8 98.2 96.9  PLT 250 290 307  Basic Metabolic Panel: Recent Labs  Lab 11/30/19 0736 11/30/19 0736 12/01/19 0538 12/02/19 0509 12/03/19 0539 12/04/19 0533 12/05/19 0544  NA 141   < > 140 138 141 140 140  K 2.9*   < > 3.9 4.5 4.2 4.1 3.8  CL 107   < > 105 107 108 108 106  CO2 25   < > 24 25 26 25 25   GLUCOSE 90   < > 88 97 99 93 89  BUN 20   < > 17 17 14 19 22   CREATININE 0.91   < > 0.94 0.86 0.89 1.10* 1.26*  CALCIUM 7.7*   < > 7.8* 7.9* 8.2* 8.3* 8.5*  MG 2.0  --   --   --   --  2.4  --    < > = values in this interval not displayed.   Liver Function Tests: Recent Labs  Lab 11/28/19 1400  AST 16  ALT 10  ALKPHOS 75  BILITOT 1.0  PROT 6.5  ALBUMIN 2.5*   No results for input(s): LIPASE, AMYLASE in the last 168 hours. No results for input(s): AMMONIA in the last 168 hours. Cardiac Enzymes: No results for input(s): CKTOTAL, CKMB, CKMBINDEX, TROPONINI in the last 168 hours. BNP (last 3 results) Recent Labs    10/02/19 0229 11/28/19 1400 11/30/19 0736  BNP 2,680.0* 3,486.0* 576.0*   CBG: Recent Labs  Lab 11/29/19 1534 11/29/19 1727  GLUCAP 74 131*    Time spent: 35 minutes  Signed:  Berle Mull  [facility]  12/05/2019 10:12 AM

## 2019-12-05 NOTE — Progress Notes (Addendum)
Attempted to call report to [facility], no answer from facility  1056: report given to Therapist, occupational at [facility].

## 2019-12-05 NOTE — Plan of Care (Signed)
  Problem: Clinical Measurements: Goal: Ability to maintain clinical measurements within normal limits will improve Outcome: Progressing   

## 2019-12-05 NOTE — TOC Transition Note (Signed)
Transition of Care [facility]) - CM/SW Discharge Note   Patient Details  Name: Tina Salinas MRN: [id number] Date of Birth: March 19, 1946  [facility]) CM/SW Contact:  Victorino Dike, RN Phone Number: 12/05/2019, 10:40 AM   Clinical Narrative:     Patient to transfer to [facility] care via [facility] today.  Discharge packet placed on chart, Bedside nurse called EMS.     Final next level of care: Skilled Nursing Facility Barriers to Discharge: Barriers Resolved   Patient Goals and CMS Choice Patient states their goals for this hospitalization and ongoing recovery are:: Improve strength CMS Medicare.gov Compare Post Acute Care list provided to:: Patient Represenative (must comment)(Lynn Randel Pigg (friend))    Discharge Placement                  Name of family member notified: friend [name] Patient and family notified of of transfer: 12/05/19  Discharge Plan and Services     Post Acute Care Choice: [place]                               Social Determinants of Health (SDOH) Interventions     Readmission Risk Interventions No flowsheet data found.

## 2019-12-08 ENCOUNTER — Telehealth: Payer: Self-pay | Admitting: Family

## 2019-12-08 NOTE — Telephone Encounter (Signed)
Spoke to staff at [facility] care who verified patients follow up [place] after her recent hospital discharge on 5/21. They stated patient is following a low sodium diet, doing daily weights, taking medications as prescribed, and doing PT/ OT with them. Patient has no complaints at this time.   Alyse Low, [place]

## 2019-12-09 ENCOUNTER — Ambulatory Visit: Payer: Medicare HMO | Admitting: Family

## 2019-12-09 DIAGNOSIS — D692 Other nonthrombocytopenic purpura: Secondary | ICD-10-CM | POA: Diagnosis not present

## 2019-12-09 DIAGNOSIS — M069 Rheumatoid arthritis, unspecified: Secondary | ICD-10-CM | POA: Diagnosis not present

## 2019-12-09 DIAGNOSIS — I429 Cardiomyopathy, unspecified: Secondary | ICD-10-CM | POA: Diagnosis not present

## 2019-12-09 DIAGNOSIS — R5381 Other malaise: Secondary | ICD-10-CM | POA: Diagnosis not present

## 2019-12-25 ENCOUNTER — Telehealth: Payer: Self-pay

## 2019-12-25 NOTE — Telephone Encounter (Signed)
Copied from [place] [phone]. Topic: General - Inquiry >> Dec 25, 2019 10:34 AM Greggory Keen D wrote: Reason for CRM: Pt called a saying she is in a rehab center [facility].  She had a heart attack and was released to the re-hab.  She wants to leave and would like Dr. Caryn Section to sign to have her released to home. She says she will have people to take care of her. She says she can have the Pt at home but he has to have it ordered. She would like some one to call her back.

## 2019-12-29 ENCOUNTER — Inpatient Hospital Stay: Payer: Medicare HMO | Admitting: Family Medicine

## 2019-12-31 ENCOUNTER — Telehealth: Payer: Self-pay

## 2019-12-31 DIAGNOSIS — G809 Cerebral palsy, unspecified: Secondary | ICD-10-CM | POA: Diagnosis not present

## 2019-12-31 DIAGNOSIS — I480 Paroxysmal atrial fibrillation: Secondary | ICD-10-CM | POA: Diagnosis not present

## 2019-12-31 DIAGNOSIS — I509 Heart failure, unspecified: Secondary | ICD-10-CM | POA: Diagnosis not present

## 2019-12-31 DIAGNOSIS — M810 Age-related osteoporosis without current pathological fracture: Secondary | ICD-10-CM | POA: Diagnosis not present

## 2019-12-31 NOTE — Telephone Encounter (Signed)
Copied from [place] [phone]. Topic: Referral - Request for Referral >> Dec 31, 2019 11:01 AM Lennox Solders wrote: Has patient seen PCP for this complaint?no  Referral for which specialty: PT [facility] on [address] in [place]. Pt is having trouble walking since her heart attack. Pt said md is aware. Pt has [employer] >> Dec 31, 2019 11:36 AM Lennox Solders wrote: Also pt is at rehab facility she does not like the service and is willing to pay her copay to go to [facility]

## 2020-01-01 ENCOUNTER — Telehealth: Payer: Self-pay

## 2020-01-01 NOTE — Telephone Encounter (Signed)
Copied from [place] [phone]. Topic: General - Other >> Jan 01, 2020  3:08 PM Wynetta Emery, [place] C wrote: Reason for CRM: pt says that she is home from the hospital and would like to have a referral to start PT with a rehab location.    Please assist.

## 2020-01-02 ENCOUNTER — Other Ambulatory Visit: Payer: Self-pay

## 2020-01-02 NOTE — Patient Outreach (Signed)
[facility]) Care Management  01/02/2020  Tina Salinas 1945/10/20 [id number]   Referral Date: 01/02/20 Referral Source: [employer] Report Date of Discharge: 01/01/20 Facility: [facility]  Insurance: [facility]   Referral received.  No outreach warranted at this time.  Transition of Care calls being completed via EMMI. RN CM will outreach patient for any red flags received.    Plan: RN CM will close case.    [name], RN, MSN [facility] Care Management Care Management Coordinator Direct Line [phone] Toll Free: [phone]  Fax: [phone]

## 2020-01-05 ENCOUNTER — Telehealth: Payer: Self-pay | Admitting: Family Medicine

## 2020-01-05 DIAGNOSIS — G809 Cerebral palsy, unspecified: Secondary | ICD-10-CM | POA: Diagnosis not present

## 2020-01-05 DIAGNOSIS — N1831 Chronic kidney disease, stage 3a: Secondary | ICD-10-CM | POA: Diagnosis not present

## 2020-01-05 DIAGNOSIS — J449 Chronic obstructive pulmonary disease, unspecified: Secondary | ICD-10-CM | POA: Diagnosis not present

## 2020-01-05 DIAGNOSIS — I5043 Acute on chronic combined systolic (congestive) and diastolic (congestive) heart failure: Secondary | ICD-10-CM | POA: Diagnosis not present

## 2020-01-05 DIAGNOSIS — J9601 Acute respiratory failure with hypoxia: Secondary | ICD-10-CM | POA: Diagnosis not present

## 2020-01-05 NOTE — Telephone Encounter (Signed)
Patient is calling to request orders for hospital bed, oxygen, wheelchair, Pads on the bed (that go under the patient) Patient states that the insurance should cover these items being on medicare. Patient asked me will these items be covered on insurance. Patient was directed to contact her insurance.  Patient was asked about rescheduling her hospital follow up that was canceled from 12/29/19. Patient declined. And stated that she was unable to walk. And Dr. Caryn Section would have to wait to see her we she got better. Patient was asked was she open to a virtual hospital follow up appt on the phone or computer. Patient states that she would be open to a telephone Hospital Follow up. Patient hung up the phone with agent before agent was able to schedule appt.  Patient [phone]

## 2020-01-06 NOTE — Telephone Encounter (Signed)
Pt stated she would like the home health orders to be a minimum of 4 days per week.

## 2020-01-06 NOTE — Telephone Encounter (Signed)
Pt called asking if Tina Salinas can come out to her house for nursing care and the things she ask for yesterday in her phone request.  She ask specifically for Golden Pop who is a Marine scientist with [facility] per patient..   Pt ask if someone will call her back at [phone]

## 2020-01-07 ENCOUNTER — Telehealth: Payer: Self-pay | Admitting: Family Medicine

## 2020-01-07 NOTE — Telephone Encounter (Addendum)
Pt states the hospital bed that was delivered hurts her left hip, and she wants to send it back.  The oxygen, she wants Dr Caryn Section to cancel.  She wants Dr Caryn Section to order the oxygen through someone else. (hopefully in [place] area)  The wheelchair they sent her does not have a leg rest.  She states her legs swell when she sits in it for very long. She cannot sit in that chair. She wants a small wheelchair with leg rest.  Pt wants Dr Caryn Section to start over with new orders on everything including the hospital bed, so she can get what she wants and needs.   Pt wants the nursing facility that came out to see her before to come out again. Pt wants therapy.  She states she is sitting all day and that is not good.  Pt states she has been home a week and not seen anyone.

## 2020-01-08 ENCOUNTER — Telehealth: Payer: Self-pay

## 2020-01-08 NOTE — Telephone Encounter (Signed)
Copied from [place] [phone]. Topic: General - Other >> Jan 01, 2020  3:08 PM Wynetta Emery, [place] C wrote: Reason for CRM: pt says that she is home from the hospital and would like to have a referral to start PT with a rehab location.    Please assist. >> Jan 08, 2020  4:16 PM Keene Breath wrote: Patient is calling again because she said she needs to speak with the doctor about getting PT since leaving the hospital.  She stated that she still has not heard from the doctor.  Please advise and call patient at [phone]

## 2020-01-09 NOTE — Telephone Encounter (Signed)
I called and spoke with patient. She states she wants both in home PT and Physical Therapy done at a facility. She would like to start out with in home PT first, then as she gets stronger progress to the facility for PT.   Patient states she also needs a new oxygen tank and a new wheel chair with legs rests. She says the size of the wheel chair should be 16x16x18.  Patient says she needs these things asap. She is unable to return the previous equipment until new orders have been sent in.

## 2020-01-09 NOTE — Telephone Encounter (Signed)
Pt wants a call back Monday from clinic, she wants bed, wheelchair, and oxygen tank from a particular location that she wants to discuss with clinic

## 2020-01-09 NOTE — Telephone Encounter (Addendum)
Is she requesting home physical therapy or is she planning on travelling to physical therapy facility?

## 2020-01-09 NOTE — Telephone Encounter (Signed)
Patient is calling hospital bed to be picked up. She will use her own bed. However, She is request bed rails for her own. Patient is also requesting a wheel chair [phone] with leg rest. Patient is requesting an oxygen tank from the same vendor as well.  Patient is also requesting pads for the bed.  Please advise CB- [phone]

## 2020-01-12 ENCOUNTER — Telehealth: Payer: Self-pay

## 2020-01-12 NOTE — Telephone Encounter (Signed)
Who is she getting these through? Ok to send order to d/c supplemental oxygen and wheelchair.

## 2020-01-12 NOTE — Telephone Encounter (Signed)
Copied from [place] [phone]. Topic: General - Other >> Jan 12, 2020  4:23 PM Wynetta Emery, [place] C wrote: Reason for CRM:pt called in to follow up, pt says that she still have a wheelchair and oxygen  machine that she need to return. Pt says that she would like to have them picked up so that she can start her PT. Pt says that provider has to assist with this process.    Please advise.

## 2020-01-13 NOTE — Telephone Encounter (Signed)
Pt called in and stated she missed a call this afternoon.  She stated she is on a walker and can not get to the phone very fast.  She called back at 5:12 and stated she would really like to talk with someone about the wheelchair oxygen and therapy   [phone]

## 2020-01-13 NOTE — Telephone Encounter (Signed)
Pt is stating that she needs Dr Caryn Section to start her oxygen and her wheelchair with new company and place an order for old to be picked up. Pt states that she must go with someone else and she needs also Phy Therapy.States no one is helping her now and she is having to take care of this and herself. Wants to make sure Dr. Caryn Section knows her help left during the nite and she is got no one to help her. Told pt that Dr. Maralyn Sago nurse would FU reach out to at Cell [phone] or her home phone at bedside [phone]

## 2020-01-13 NOTE — Telephone Encounter (Signed)
Patient has supplies through [employer]. I called [facility] and gave a verbal order to discontinue supplemental oxygen, wheel chair and hospital bed. I then faxed a signed order to [facility] to discontinue supplies. Per [facility] representative Tama Headings a technician should be by patient's home to pick up equipment tomorrow or Thursday.   I called patients phone and left update on her voice message system.

## 2020-01-14 NOTE — Telephone Encounter (Signed)
Signed orders faxed to [facility]. Patient notified and advised to expect a call from [facility].

## 2020-01-14 NOTE — Telephone Encounter (Signed)
Patient advised that orders have been faxed to [facility] to d/c supplemental oxygen, wheel chair and hospital bed. Patient verbalized understanding.   She wants [place] to order the new supplies from what ever company uses [facility]. Patient would like:  1) a wheel chair that is the size [phone] with leg rest.  2) She would like supplemental oxygen 3) Bed railings to go on the side of her bed that will help her turn in bed. 4) she would like orders placed for in home physical therapy. 5) she would like to have a nurse come to help her in the home for about 4-5 hours daily. She says she was paying a friend $875 per week to stay with her, but her friend left her in the middle of the night and she now has no one.   I called [facility] and spoke with representative Dawana. I was advised that patient is still "on service" with [facility]. Dwana states that they called patient last week to find out when they could reume care. Patient was supposed to call K[facility] Mohala back when she was ready to resume care. Dwana states that her orders are about to expire and [facility] will need new orders from the doctor on what he wants to order for patient. Dwana states that she needs our office to fax over the current orders to fax # [phone]. Dwana says we can fax the previous signed orders from 11/2019 if we want.  Do we need to place a new referral order for home health evaluation since patient has specific request? Or can we fax over a written order for the items that patient requested?

## 2020-01-15 NOTE — Telephone Encounter (Signed)
I returned patients call. She asked me to call [facility] back because she lost the call when they called her earlier today. I called [facility] for patient and asked them to call patient back.

## 2020-01-15 NOTE — Telephone Encounter (Signed)
Patient called to speak with [name].  Stated that it was important but would not give any further details.  Please call patient back.

## 2020-01-16 ENCOUNTER — Telehealth: Payer: Self-pay

## 2020-01-16 NOTE — Telephone Encounter (Signed)
Copied from [place] [phone]. Topic: General - Other >> Jan 16, 2020 10:41 AM Alanda Slim E wrote: Reason for CRM: Pt wants [name] to call her about medical equipment / please advise

## 2020-01-19 ENCOUNTER — Other Ambulatory Visit: Payer: Self-pay | Admitting: Family Medicine

## 2020-01-20 ENCOUNTER — Telehealth: Payer: Self-pay

## 2020-01-20 NOTE — Telephone Encounter (Signed)
Copied from [place] [phone]. Topic: General - Other >> Jan 20, 2020  2:27 PM Celene Kras wrote: Reason for CRM: Pt called and is requesting to speak with Rosheena regarding medical equipment that she has not received yet. Please advise.

## 2020-01-20 NOTE — Telephone Encounter (Signed)
Copied from [place] #[id number]. Topic: General - Other >> Jan 20, 2020  2:03 PM Leward Quan A wrote: Reason for CRM: Patient called to ask for Rasheina to give her a call back when she have time to discuss medical equipment Please call Ph# [phone]

## 2020-01-20 NOTE — Telephone Encounter (Signed)
I called [facility] to get an update on orders that was faxed on 01/14/2020. I spoke with agent named Chrisheena. Chrisheena advised me that they did receive the order I faxed on 01/14/2020, but it was attached to her previous open orders from the last hospitalization back in May 2021, which expired on 01/15/2020. Chrisheena states that she is going to send the orders I faxed to the marketer so that the orders can be reviewed and processed. Chrisheena requested that I fax over a copy of patients demographics, insurance, history and physical and progress notes. I faxed over insurance and demographics along with hospital notes from 11/2019. Patient does not have any recent office notes since her hospitalization. Chrisheena states that she is going to try getting someone out to the patients home this week to start orders.

## 2020-01-21 ENCOUNTER — Telehealth: Payer: Self-pay

## 2020-01-21 DIAGNOSIS — J449 Chronic obstructive pulmonary disease, unspecified: Secondary | ICD-10-CM | POA: Diagnosis not present

## 2020-01-21 NOTE — Telephone Encounter (Signed)
Copied from [place] [phone]. Topic: General - Call Back - No Documentation >> Jan 21, 2020 10:59 AM Erick Blinks wrote: Reason for CRM: Pt needs a call back from [facility], she needs her to call [facility] for PT. She says her PCP has approved PT. She needs [name] to call and order the equipment she needs Administrator, Civil Service, Oxygen etc) Best contact: [phone]

## 2020-01-21 NOTE — Telephone Encounter (Signed)
I am not able to call patient.  I am working with another provider on the other side of the office and will not have time to call patient due to heavy patient schedule. Please call patient and help her with whatever she is needing. I probably wont be available to call patient any this week due to my assignment.

## 2020-01-21 NOTE — Telephone Encounter (Signed)
Copied from [place] #[id number]. Topic: General - Call Back - No Documentation >> Jan 20, 2020  5:22 PM Jaynie Collins D wrote: Reason for BFX:OVANVBT called regarding medical equipment that needs to be picked up at her home, patient would like Rosheena to contact her in regards to TE made today.

## 2020-01-21 NOTE — Telephone Encounter (Signed)
Patient advised that Tina Salinas is working with another provider today. She is adamant that [place] calls her back today. She states she needs her oxygen today. Patient wants [name] to call to see when the company will be delivering her oxygen. Advised patient that she could call the company to get a update. Patient only wants to speak with [name] and wants [name] to call the medicl supply company.

## 2020-01-21 NOTE — Telephone Encounter (Addendum)
I called and spoke with patient. She states that [facility] has picked up their supplies. She states she received her equipment today through [facility] today. She wants Dr. Caryn Section or one of the covering providers to order a cushion for the wheel chair. She request that we send [facility] an order for a cushion. She states she is still waiting on her Physical therapy to start. Please advise if ok to write order for a cushion for her wheel chair.

## 2020-01-21 NOTE — Progress Notes (Signed)
Pt declined completing the AWV today or in the future. Apt cancelled. This encounter was created in error - please disregard.

## 2020-01-21 NOTE — Telephone Encounter (Addendum)
Pt is calling and the other company is coming today and pt does not know the name of medical equipment place and she would like rosheena to return her call today

## 2020-01-21 NOTE — Telephone Encounter (Signed)
Pt is calling back to let rosheena know [facility] is pick up the oxygen , wheelchair and bed. Pt needs rosheena to call her back

## 2020-01-21 NOTE — Telephone Encounter (Signed)
Copied from [place] [phone]. Topic: General - Other >> Jan 21, 2020 11:42 AM Tina Salinas wrote: Reason for CRM: Pt needs to speak with REGINA only / please advise

## 2020-01-22 ENCOUNTER — Other Ambulatory Visit: Payer: Self-pay

## 2020-01-22 ENCOUNTER — Telehealth: Payer: Self-pay

## 2020-01-22 NOTE — Telephone Encounter (Signed)
Send order to get a wheelchair cushion to prevent pressure sores.

## 2020-01-22 NOTE — Telephone Encounter (Signed)
Pt called stating that she still has not received her 2 small tanks of oxygen. Pt is also requesting to have a prescription for Surewick tube for her urine. Pt states that she does not have her social security number and is requesting to have a call with the full social security number. Please advise.

## 2020-01-22 NOTE — Telephone Encounter (Signed)
Called pt to complete her telephonic AWV and pt declined completing. Pt stated she was not interested in this apt and there was nothing I could do for her so she did not want me wasting her time or her wasting mine. Advised CG to remove from call list. Apt cancelled.

## 2020-01-22 NOTE — Telephone Encounter (Signed)
Copied from [place] [phone]. Topic: General - Call Back - No Documentation >> Jan 21, 2020 10:59 AM Tina Salinas wrote: Reason for CRM: Pt needs a call back from [facility], she needs her to call [facility] for PT. She says her PCP has approved PT. She needs [name] to call and order the equipment she needs Administrator, Civil Service, Oxygen etc) Best contact: [phone] >> Jan 22, 2020  3:52 PM Tina Salinas wrote: Pt wants to let office know she wants the two small tanks of oxygen due to larger tank being to heavy for her to pick up /and also a small wheelchair padding or cushion is needed as well due to hurting her legs / Pt also asked for an order for two side railings for her queen sized bed /Pt has a question about something that is similar to a catheter that catches your urine/ pt states its called sure quick and advertised on TV/ she would like an order for this as well.    Pt also wants to know if the office has a copy of her SS card   Pt also would like an order for PT to come out to her home / she wants a particular person that she had before Minna Merritts was his name)

## 2020-01-23 ENCOUNTER — Telehealth: Payer: Self-pay

## 2020-01-23 NOTE — Telephone Encounter (Signed)
Copied from [place] [phone]. Topic: General - Call Back - No Documentation >> Jan 23, 2020  3:55 PM Jaynie Collins D wrote: Reason for CRM: Patient is states she hash been requesting a referral for  PT for over 3 weeks, patient contacted referral location and they stated they haven't  received anything yet. Patient is requesting Rosheena to call her once referral has been sent.

## 2020-01-23 NOTE — Telephone Encounter (Signed)
I called patient and advised her that [facility] sent our office a message today stating that Medicare requires a face to face visit for Home health and Physical therapy. The visit has to have been within the past 90 days.  They will allow a virtual visit but it has to be a video visit. They will not accept a telephone visit. I called patient and advised her of this. Virtual visit scheduled for Tuesday 01/27/2020 at 1pm.

## 2020-01-26 ENCOUNTER — Telehealth: Payer: Self-pay | Admitting: Family Medicine

## 2020-01-26 NOTE — Telephone Encounter (Signed)
Face to face for home health Received: 3 days ago Clois Dupes, [place]; Birdie Sons, MD Phone Number: [phone]  Good morning! My office received the request for home health PT and Aide services for Ms. Tina Salinas. However per Medicare guidelines a face to face note must be completed from within the last 90 days. I see that she has had a recent tele visit, but it was documented to be an audio only. Per Medicare the visit must be face to face encounter in the office or an Audio and Visual Televisit. Can you please get Ms. Tina Salinas scheduled for a face to face asap? Please let me know if you have any questions!   Jana Half, [place]

## 2020-01-27 ENCOUNTER — Telehealth: Payer: Self-pay

## 2020-01-27 ENCOUNTER — Encounter: Payer: Self-pay | Admitting: Family Medicine

## 2020-01-27 ENCOUNTER — Telehealth (INDEPENDENT_AMBULATORY_CARE_PROVIDER_SITE_OTHER): Payer: Medicare HMO | Admitting: Family Medicine

## 2020-01-27 DIAGNOSIS — Z5329 Procedure and treatment not carried out because of patient's decision for other reasons: Secondary | ICD-10-CM

## 2020-01-27 NOTE — Telephone Encounter (Signed)
Copied from [place] [phone]. Topic: General - Other >> Jan 27, 2020 11:59 AM Greggory Keen D wrote: Reason for CRM: Pt called saying she can not make her appt today because she cannot get a ride.  She does not want to do a My chart virtual or phone visit.  She said she will call back when she can get a ride to the office.

## 2020-01-27 NOTE — Telephone Encounter (Signed)
Copied from [place] [id number]. Topic: General - Other >> Jan 27, 2020 11:14 AM Leward Quan A wrote: Reason for CRM: Patient called to inform Linus Salmons that she will try and be there for her appointment but that her ride have not come yet. States that she will call if they do not show up. Please advise

## 2020-01-27 NOTE — Progress Notes (Deleted)
MyChart Video Visit    Virtual Visit via Video Note   This visit type was conducted due to national recommendations for restrictions regarding the COVID-19 Pandemic (e.g. social distancing) in an effort to limit this patient's exposure and mitigate transmission in our community. This patient is at least at moderate risk for complications without adequate follow up. This format is felt to be most appropriate for this patient at this time. Physical exam was limited by quality of the video and audio technology used for the visit.   Patient location: *** Provider location: ***   Patient: Tina Salinas   DOB: Dec 21, 1945   74 y.o. Female  MRN: [id number] Visit Date: 01/27/2020  Today's healthcare provider: Lelon Huh, MD   No chief complaint on file.  [name],acting as a Education administrator for Lelon Huh, MD.,have documented all relevant documentation on the behalf of Lelon Huh, MD,as directed by  Lelon Huh, MD while in the presence of Lelon Huh, MD.  Subjective    HPI  Patient presents for a face to face consult for Home health and physical therapy services through [facility].   {Show patient history (optional):23778::" "}  Medications: Outpatient Medications Prior to Visit  Medication Sig  . alendronate (FOSAMAX) 70 MG tablet Take 70 mg by mouth once a week.   Marland Kitchen amiodarone (PACERONE) 200 MG tablet Take 200 mg by mouth daily.  Marland Kitchen aspirin 81 MG tablet Take 81 mg by mouth daily.  . Calcium Carbonate Antacid (CALCIUM CARBONATE PO) Take 200 mg by mouth daily.   . cholecalciferol (VITAMIN D) 1000 UNITS tablet Take 1,000 Units by mouth daily.   Marland Kitchen docusate sodium (COLACE) 100 MG capsule Take 1 capsule (100 mg total) by mouth daily.  . ferrous [id number] C-folic acid (TRINSICON / FOLTRIN) capsule Take 1 capsule by mouth 2 (two) times daily after a meal.  . furosemide (LASIX) 20 MG tablet Take 1 tablet (20 mg total) by mouth daily as needed for fluid or edema  (weight gain of 3lbs in 1 day or 5 lbs in 3 days).  . hydroxychloroquine (PLAQUENIL) 200 MG tablet Take 400 mg by mouth daily.   Marland Kitchen latanoprost (XALATAN) 0.005 % ophthalmic solution Place 1 drop into both eyes at bedtime.  . Methotrexate, PF, 25 MG/0.5ML SOAJ Inject 1.4 mLs into the skin once a week.  . Methylcellulose, Laxative, (GNP FIBER THERAPY PO) Take 1 Dose by mouth daily as needed (constipation).   . mirtazapine (REMERON) 30 MG tablet Take 1 tablet (30 mg total) by mouth at bedtime.  Marland Kitchen oxybutynin (DITROPAN) 5 MG tablet TAKE 1 TABLET BY MOUTH TWICE DAILY AS NEEDED FOR BLADDER SPASMS  . pravastatin (PRAVACHOL) 40 MG tablet Take 1 tablet (40 mg total) by mouth at bedtime.  . predniSONE (DELTASONE) 2.5 MG tablet Take 2.5 mg by mouth daily with breakfast.  . promethazine (PHENERGAN) 25 MG tablet Take 25 mg by mouth every 6 (six) hours as needed for nausea or vomiting.  . Vitamin D, Ergocalciferol, (DRISDOL) 1.25 MG (50000 UNIT) CAPS capsule Take 1 capsule (50,000 Units total) by mouth every 7 (seven) days.   No facility-administered medications prior to visit.    Review of Systems  {Heme  Chem  Endocrine  Serology  Results Review (optional):23779::" "}  Objective    There were no vitals taken for this visit. {Show previous vital signs (optional):23777::" "}  Physical Exam     Assessment & Plan     ***  No follow-ups on  file.     I discussed the assessment and treatment plan with the patient. The patient was provided an opportunity to ask questions and all were answered. The patient agreed with the plan and demonstrated an understanding of the instructions.   The patient was advised to call back or seek an in-person evaluation if the symptoms worsen or if the condition fails to improve as anticipated.  I provided *** minutes of non-face-to-face time during this encounter.  {provider attestation***:1}  Lelon Huh, MD [facility] [phone]  (phone) [phone] (fax)  [place]

## 2020-01-28 ENCOUNTER — Telehealth: Payer: Self-pay

## 2020-01-28 NOTE — Progress Notes (Signed)
Patient cancelled

## 2020-01-28 NOTE — Telephone Encounter (Signed)
I called patient back. She wanted to reschedule her missed appointment from this week. I rescheduled her face to face for home health and Physical therapy to Wednesday, 02/04/2020 at 11:20am. She would also like an x ray of her right leg during this appointment. She says she thinks one of the caregivers hurt her leg when they were lifting her. Patient is going to try to find transportation. She will call back if she is unable to find a ride.

## 2020-01-28 NOTE — Telephone Encounter (Signed)
Copied from [place] [phone]. Topic: General - Other >> Jan 27, 2020 11:59 AM Greggory Keen D wrote: Reason for CRM: Pt called saying she can not make her appt today because she cannot get a ride.  She does not want to do a My chart virtual or phone visit.  She said she will call back when she can get a ride to the office. >> Jan 27, 2020  5:02 PM Greggory Keen D wrote: Pt called saying the person did not pick her up today for her appt.  She wants to get started back with her PT but she cant get in the office for a visit because she can't get a ride.    CB#  [phone]

## 2020-01-28 NOTE — Telephone Encounter (Signed)
PT called and is requesting to have PCP nurse give her a call back by the end of the day. Please advise .

## 2020-01-29 ENCOUNTER — Telehealth: Payer: Self-pay | Admitting: Family Medicine

## 2020-01-29 NOTE — Telephone Encounter (Signed)
Pt called demanding to know if Dr F would be in office Friday, and not whether he had appts available. Pt was told he was in office but had no ppt available as short staff this week. Pt was very upset and demanded a call back as this day was a day she had already secured transportation. Pt was told our apologies for not being able to sch on this day.

## 2020-02-03 NOTE — Progress Notes (Signed)
Established patient visit   Patient: Tina Salinas   DOB: 04/01/1946   74 y.o. Female  MRN: [id number] Visit Date: 02/04/2020  Today's healthcare provider: Lelon Huh, MD   Chief Complaint  Patient presents with  . Leg Pain   Subjective    HPI   Pt is here today to complete a face to face appointment for home health and physical therapy.  She has complicated history of congestive heart failure and cerebral palsy now confined to wheelchair due to generalized and lower extremity weakness. She gets short of breath with exertion and when supine. She is no longer able to ambulate without assistance and is not able to transfer from her wheelchair to her regular bed. She has also been having worsening leg pain which she apparently injured attempting to transfer to bed. She continues to have poor appetite and declining weight. She has been on home O2 for several months, at least since last hospitalization for CHF exacerbation in May, but has large tank that she is unable to transport with her.     Medications: Outpatient Medications Prior to Visit  Medication Sig  . alendronate (FOSAMAX) 70 MG tablet Take 70 mg by mouth once a week.   Marland Kitchen amiodarone (PACERONE) 200 MG tablet Take 200 mg by mouth daily.  Marland Kitchen aspirin 81 MG tablet Take 81 mg by mouth daily.  . Calcium Carbonate Antacid (CALCIUM CARBONATE PO) Take 200 mg by mouth daily.   . cholecalciferol (VITAMIN D) 1000 UNITS tablet Take 1,000 Units by mouth daily.   Marland Kitchen docusate sodium (COLACE) 100 MG capsule Take 1 capsule (100 mg total) by mouth daily.  . ferrous [id number] C-folic acid (TRINSICON / FOLTRIN) capsule Take 1 capsule by mouth 2 (two) times daily after a meal.  . furosemide (LASIX) 20 MG tablet Take 1 tablet (20 mg total) by mouth daily as needed for fluid or edema (weight gain of 3lbs in 1 day or 5 lbs in 3 days).  . hydroxychloroquine (PLAQUENIL) 200 MG tablet Take 400 mg by mouth daily.   Marland Kitchen latanoprost (XALATAN)  0.005 % ophthalmic solution Place 1 drop into both eyes at bedtime.  . Methotrexate, PF, 25 MG/0.5ML SOAJ Inject 1.4 mLs into the skin once a week.  . Methylcellulose, Laxative, (GNP FIBER THERAPY PO) Take 1 Dose by mouth daily as needed (constipation).   . mirtazapine (REMERON) 30 MG tablet Take 1 tablet (30 mg total) by mouth at bedtime.  Marland Kitchen oxybutynin (DITROPAN) 5 MG tablet TAKE 1 TABLET BY MOUTH TWICE DAILY AS NEEDED FOR BLADDER SPASMS  . pravastatin (PRAVACHOL) 40 MG tablet Take 1 tablet (40 mg total) by mouth at bedtime.  . predniSONE (DELTASONE) 2.5 MG tablet Take 2.5 mg by mouth daily with breakfast.  . promethazine (PHENERGAN) 25 MG tablet Take 25 mg by mouth every 6 (six) hours as needed for nausea or vomiting.  . Vitamin D, Ergocalciferol, (DRISDOL) 1.25 MG (50000 UNIT) CAPS capsule Take 1 capsule (50,000 Units total) by mouth every 7 (seven) days.   No facility-administered medications prior to visit.    Review of Systems  Constitutional: Negative.   Respiratory: Negative.   Cardiovascular: Negative.   Gastrointestinal: Negative.   Musculoskeletal: Positive for arthralgias, gait problem and neck pain. Negative for back pain, joint swelling, myalgias and neck stiffness.  Neurological: Positive for weakness. Negative for dizziness, light-headedness and headaches.      Objective    BP 109/69 (BP Location: Right Arm, Patient Position: Sitting,  Cuff Size: Normal)   Pulse (!) 57   Wt 70 lb (31.8 kg) Comment: Per pt.  SpO2 99%   BMI 13.23 kg/m    Physical Exam  Patient confined to wheelchair. Cachectic appearance. +3 bilateral LE strength. +2 UE strength. +1 DTRs. Heart RRR. Chest CTA. Unable to stand from sitting position.   No results found for any visits on 02/04/20.  Assessment & Plan     1. Moderate protein-calorie malnutrition (HCC)  - megestrol (MEGACE) 400 MG/10ML suspension; Take 10 mLs (400 mg total) by mouth daily.  Dispense: 300 mL; Refill: 1  2. 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  Dispense: 540 tablet; Refill: 0  3. Infantile cerebral palsy ([place])   4. Essential (primary) hypertension Stable.   5. Chronic bilateral low back pain without sciatica   6. Pain in both lower extremities She is in need of physical therapy with severely limited mobility. Will order home PT, but she wants to look into which [facility] agency to use.   7. Weakness of both lower extremities  8. Chronic combined systolic and diastolic CHF (congestive heart failure) ([place]) She has severely limited mobility and is unable to transfer from wheelchair to regular bed. She requires repositioning in ways that cannot be achieved in ordinary bed due to her generalized weakness. She additionally requires ability to raise head of bed more than 30 degrees due to underlying systolic heart failure and therefore requires hospital bed.   9. Impaired mobility She needs home physical which we will initiate when she decides which home health agency she prefers.  She is wheelchair bound and requires seat cushioning to prevent pressure ulcers.   No follow-ups on file.     Addressed extensive list of chronic and acute medical problems today requiring 54 minutes reviewing her medical record, counseling patient regarding her conditions and coordination of care.     The entirety of the information documented in the History of Present Illness, Review of Systems and Physical Exam were personally obtained by me. Portions of this information were initially documented by the CMA and reviewed by me for thoroughness and accuracy.      Lelon Huh, MD  [facility] [phone] (phone) [phone] (fax)  [place]

## 2020-02-04 ENCOUNTER — Ambulatory Visit (INDEPENDENT_AMBULATORY_CARE_PROVIDER_SITE_OTHER): Payer: Medicare HMO | Admitting: Family Medicine

## 2020-02-04 ENCOUNTER — Encounter: Payer: Self-pay | Admitting: Family Medicine

## 2020-02-04 ENCOUNTER — Other Ambulatory Visit: Payer: Self-pay

## 2020-02-04 ENCOUNTER — Telehealth: Payer: Self-pay

## 2020-02-04 VITALS — BP 109/69 | HR 57 | Wt <= 1120 oz

## 2020-02-04 DIAGNOSIS — G8929 Other chronic pain: Secondary | ICD-10-CM

## 2020-02-04 DIAGNOSIS — M79604 Pain in right leg: Secondary | ICD-10-CM | POA: Diagnosis not present

## 2020-02-04 DIAGNOSIS — M545 Low back pain, unspecified: Secondary | ICD-10-CM

## 2020-02-04 DIAGNOSIS — I10 Essential (primary) hypertension: Secondary | ICD-10-CM

## 2020-02-04 DIAGNOSIS — G809 Cerebral palsy, unspecified: Secondary | ICD-10-CM

## 2020-02-04 DIAGNOSIS — J9601 Acute respiratory failure with hypoxia: Secondary | ICD-10-CM | POA: Diagnosis not present

## 2020-02-04 DIAGNOSIS — I5042 Chronic combined systolic (congestive) and diastolic (congestive) heart failure: Secondary | ICD-10-CM | POA: Diagnosis not present

## 2020-02-04 DIAGNOSIS — E440 Moderate protein-calorie malnutrition: Secondary | ICD-10-CM | POA: Diagnosis not present

## 2020-02-04 DIAGNOSIS — M05741 Rheumatoid arthritis with rheumatoid factor of right hand without organ or systems involvement: Secondary | ICD-10-CM

## 2020-02-04 DIAGNOSIS — I5043 Acute on chronic combined systolic (congestive) and diastolic (congestive) heart failure: Secondary | ICD-10-CM | POA: Diagnosis not present

## 2020-02-04 DIAGNOSIS — R29898 Other symptoms and signs involving the musculoskeletal system: Secondary | ICD-10-CM

## 2020-02-04 DIAGNOSIS — M79605 Pain in left leg: Secondary | ICD-10-CM

## 2020-02-04 DIAGNOSIS — N1831 Chronic kidney disease, stage 3a: Secondary | ICD-10-CM | POA: Diagnosis not present

## 2020-02-04 DIAGNOSIS — Z7409 Other reduced mobility: Secondary | ICD-10-CM | POA: Diagnosis not present

## 2020-02-04 MED ORDER — HYDROCODONE-ACETAMINOPHEN 7.5-325 MG PO TABS: 1.0000 | ORAL_TABLET | Freq: Three times a day (TID) | ORAL | 0 refills | Status: AC | PRN

## 2020-02-04 NOTE — Telephone Encounter (Signed)
Patient called to request hospital bed with rails, portable oxygen, padded seat for wheel chair and x-ray of legs due to pain and weakness. Patient reports that she would like to go to physical therapy to the same place she has been before. Patient not sure of name but has a phone number [phone]. Patient is aware that Dr. Caryn Section is out of the office till Monday. Patient requesting to have hospital bed next week. Patient reports that [facility] told her that all they need a written prescription for bed and that it would get delivered the following day.

## 2020-02-05 ENCOUNTER — Telehealth: Payer: Self-pay

## 2020-02-05 MED ORDER — MEGESTROL ACETATE 400 MG/10ML PO SUSP: 400.0000 mg | Freq: Every day | ORAL | 1 refills | Status: DC

## 2020-02-05 NOTE — Telephone Encounter (Signed)
Ok to send written order for hospital bed, but I will not be in the office to sign it One of the other providers should be able to sign it if it written out for them. Dx: cerebral palsy, rheumatoid arthritis and bilateral LE weakness

## 2020-02-05 NOTE — Telephone Encounter (Signed)
Copied from [place] [phone]. Topic: General - Other >> Feb 05, 2020  1:50 PM Wynetta Emery, [place] C wrote: Reason for CRM: pt called in to request a call back from [place], pt says that the call is in reference to medical equipment. Pt prefer to discuss further with assistant.   CB: [phone]

## 2020-02-05 NOTE — Telephone Encounter (Signed)
Patient requesting call from [facility] regarding PT.

## 2020-02-05 NOTE — Telephone Encounter (Signed)
Will you write out a script and see if Dr. Rosanna Randy can take care of this since Dr. Caryn Section is out?

## 2020-02-06 NOTE — Telephone Encounter (Signed)
I called patient. Patient asked me to send Dr. Caryn Section a message. She wants an update from her recent office visit on Wednesday. She wants to know if Dr. Caryn Section has ordered her Physical therapy and medical equipment? I wasn't here on the day of her visit, so I wasn't able to give her an update on these orders. Patient is aware that Dr. Caryn Section is out of the office until Monday, and says she is fine waiting for him to return for an update. Patient says she wants the Physical therapy from [facility] and the hospital bed with railings from [facility].   Patient also asked about an update of her leg x ray. She says she talked to Dr. Caryn Section about this during her appointment. She wants to know when this will be done.

## 2020-02-06 NOTE — Telephone Encounter (Signed)
Copied from [place] [phone]. Topic: General - Inquiry >> Feb 06, 2020  9:38 AM Lennox Solders wrote: Reason for CRM: pt is calling and only want to talk with roshena concerning therapy.pt did not elaborate

## 2020-02-11 ENCOUNTER — Telehealth: Payer: Self-pay

## 2020-02-11 NOTE — Telephone Encounter (Signed)
Patient called back to check on the status of these orders. Im not sure the orders were written and faxed, so I wrote out an order and placed on your desk for review/ signature. Patient is requesting : 1) Hospital bed with railings on both sides 2) portable oxygen tank 3) padded seat cushion for wheel chair  Patient says she thinks her leg is broken, so she has scheduled an appointment to see orthopedics (Dr. Rudene Christians) on 03/03/2020. She wants to hold off on Physical therapy orders until she finds out what is wrong with her leg.   Patient is aware that you are out of the office until Friday 02/13/2020 and she is fine with waiting until you return for an update/ response.

## 2020-02-11 NOTE — Telephone Encounter (Signed)
Copied from [place] [phone]. Topic: General - Other >> Feb 11, 2020  3:06 PM Hinda Lenis D wrote: PT need to speak with Rollene Fare / she decline my help or the reason for her call  / please advise

## 2020-02-16 ENCOUNTER — Telehealth: Payer: Self-pay | Admitting: Family Medicine

## 2020-02-16 NOTE — Telephone Encounter (Signed)
Patient requesting call from [place]. She would only disclose that it's regarding hospice and medicare.

## 2020-02-17 NOTE — Telephone Encounter (Signed)
[facility] calling to check status of order for hospital bed for patient.    Cb# [phone]

## 2020-02-18 NOTE — Telephone Encounter (Signed)
All documentation they requested via fax is in office note from 02-04-2020. Please fax office note.

## 2020-02-18 NOTE — Telephone Encounter (Addendum)
I called [place] and was advised that they need a narrative that mentions that patient has a need for frequent change in body positions. I faxed a copy of the office note from 02/04/2020 to [facility]. Fax # [phone]

## 2020-02-18 NOTE — Telephone Encounter (Signed)
Copied from [place] [id number]. Topic: General - Other >> Feb 18, 2020  3:56 PM Keene Breath wrote: Reason for CRM: Patient called to speak with [name] regarding her medication equipment.  Would not give any further details.  Please call patient at [phone]

## 2020-02-20 NOTE — Telephone Encounter (Signed)
I called [facility] to get an update on the medical supplies. [facility] says that they have tried calling the patient and left message for her to call back. I called patient and informed her of this. Patient states she is going to call them back. I offered her the phone number, but she said that she has it in her phone. [facility]'s number is [phone]. Their fax number is [phone].

## 2020-02-20 NOTE — Telephone Encounter (Signed)
Pt called back in today and would like [name] to call her today.  She has some question about her medical equipment. Best number [phone]

## 2020-02-21 DIAGNOSIS — J449 Chronic obstructive pulmonary disease, unspecified: Secondary | ICD-10-CM | POA: Diagnosis not present

## 2020-02-27 ENCOUNTER — Telehealth: Payer: Self-pay

## 2020-02-27 DIAGNOSIS — Z7409 Other reduced mobility: Secondary | ICD-10-CM

## 2020-02-27 DIAGNOSIS — M79604 Pain in right leg: Secondary | ICD-10-CM

## 2020-02-27 DIAGNOSIS — R29898 Other symptoms and signs involving the musculoskeletal system: Secondary | ICD-10-CM

## 2020-02-27 DIAGNOSIS — G809 Cerebral palsy, unspecified: Secondary | ICD-10-CM

## 2020-02-27 DIAGNOSIS — R531 Weakness: Secondary | ICD-10-CM

## 2020-02-27 NOTE — Telephone Encounter (Signed)
Attempted to contact patient, no answer left a voicemail. Okay for PEC to advise patient that Dr. Caryn Section is out of the office until 8/23 and please get more information from patient regarding what she is requesting.

## 2020-02-27 NOTE — Telephone Encounter (Signed)
Copied from [place] [phone]. Topic: Referral - Request for Referral >> Feb 26, 2020  1:55 PM Hinda Lenis D wrote: PT need a call back in regards to Physical Therapy options / please advise

## 2020-02-27 NOTE — Telephone Encounter (Signed)
Patient states she is returning call to office.

## 2020-03-01 DIAGNOSIS — M069 Rheumatoid arthritis, unspecified: Secondary | ICD-10-CM | POA: Diagnosis not present

## 2020-03-01 DIAGNOSIS — G809 Cerebral palsy, unspecified: Secondary | ICD-10-CM | POA: Diagnosis not present

## 2020-03-01 NOTE — Telephone Encounter (Signed)
Patient called and she says she is trying to take care of the physical therapy. She says she has an appointment on Wednesday and will discuss it then. The appointment is with Dr. Hessie Knows, Orthopedic Surgeon.

## 2020-03-01 NOTE — Telephone Encounter (Signed)
Patient is going to an orthopedic doctor wednesday, can Dr. Caryn Section order PT &  needing assistance bathing  for her. Patient is wanting to know how long well it take to order? Please advise CB- [phone]

## 2020-03-01 NOTE — Telephone Encounter (Signed)
I assume she is requesting home physical therapy. Is there a particular home health agency she wants for home PT?

## 2020-03-02 ENCOUNTER — Telehealth: Payer: Self-pay

## 2020-03-02 NOTE — Addendum Note (Signed)
Addended by: Shawna Orleans on: 03/02/2020 05:06 PM   Modules accepted: Orders

## 2020-03-02 NOTE — Telephone Encounter (Signed)
Copied from [place] [phone]. Topic: General - Call Back - No Documentation >> Mar 02, 2020  1:57 PM Erick Blinks wrote: Reason for CRM: Pt would like a return call from the nurse regarding medical supplies. Please advise  Best contact: [phone]

## 2020-03-03 ENCOUNTER — Other Ambulatory Visit: Payer: Self-pay

## 2020-03-03 DIAGNOSIS — Z7409 Other reduced mobility: Secondary | ICD-10-CM

## 2020-03-03 DIAGNOSIS — R29898 Other symptoms and signs involving the musculoskeletal system: Secondary | ICD-10-CM

## 2020-03-03 DIAGNOSIS — M25551 Pain in right hip: Secondary | ICD-10-CM | POA: Diagnosis not present

## 2020-03-03 DIAGNOSIS — M25561 Pain in right knee: Secondary | ICD-10-CM | POA: Diagnosis not present

## 2020-03-03 DIAGNOSIS — M79605 Pain in left leg: Secondary | ICD-10-CM

## 2020-03-03 DIAGNOSIS — G8929 Other chronic pain: Secondary | ICD-10-CM

## 2020-03-03 DIAGNOSIS — M545 Low back pain, unspecified: Secondary | ICD-10-CM

## 2020-03-03 DIAGNOSIS — M79604 Pain in right leg: Secondary | ICD-10-CM

## 2020-03-03 DIAGNOSIS — G809 Cerebral palsy, unspecified: Secondary | ICD-10-CM

## 2020-03-03 DIAGNOSIS — M1711 Unilateral primary osteoarthritis, right knee: Secondary | ICD-10-CM | POA: Diagnosis not present

## 2020-03-03 DIAGNOSIS — E440 Moderate protein-calorie malnutrition: Secondary | ICD-10-CM

## 2020-03-04 DIAGNOSIS — M05741 Rheumatoid arthritis with rheumatoid factor of right hand without organ or systems involvement: Secondary | ICD-10-CM | POA: Diagnosis not present

## 2020-03-04 DIAGNOSIS — I130 Hypertensive heart and chronic kidney disease with heart failure and stage 1 through stage 4 chronic kidney disease, or unspecified chronic kidney disease: Secondary | ICD-10-CM | POA: Diagnosis not present

## 2020-03-04 DIAGNOSIS — M05471 Rheumatoid myopathy with rheumatoid arthritis of right ankle and foot: Secondary | ICD-10-CM | POA: Diagnosis not present

## 2020-03-04 DIAGNOSIS — I739 Peripheral vascular disease, unspecified: Secondary | ICD-10-CM | POA: Diagnosis not present

## 2020-03-04 DIAGNOSIS — N183 Chronic kidney disease, stage 3 unspecified: Secondary | ICD-10-CM | POA: Diagnosis not present

## 2020-03-04 DIAGNOSIS — G809 Cerebral palsy, unspecified: Secondary | ICD-10-CM | POA: Diagnosis not present

## 2020-03-04 DIAGNOSIS — M158 Other polyosteoarthritis: Secondary | ICD-10-CM | POA: Diagnosis not present

## 2020-03-04 DIAGNOSIS — G8929 Other chronic pain: Secondary | ICD-10-CM | POA: Diagnosis not present

## 2020-03-04 DIAGNOSIS — I5042 Chronic combined systolic (congestive) and diastolic (congestive) heart failure: Secondary | ICD-10-CM | POA: Diagnosis not present

## 2020-03-06 DIAGNOSIS — I5043 Acute on chronic combined systolic (congestive) and diastolic (congestive) heart failure: Secondary | ICD-10-CM | POA: Diagnosis not present

## 2020-03-06 DIAGNOSIS — J9601 Acute respiratory failure with hypoxia: Secondary | ICD-10-CM | POA: Diagnosis not present

## 2020-03-06 DIAGNOSIS — G809 Cerebral palsy, unspecified: Secondary | ICD-10-CM | POA: Diagnosis not present

## 2020-03-06 DIAGNOSIS — N1831 Chronic kidney disease, stage 3a: Secondary | ICD-10-CM | POA: Diagnosis not present

## 2020-03-08 NOTE — Telephone Encounter (Signed)
Spoke with patient on the phone who states that she is unsure what supplies she needs and will call the office back when she finds her paper with list of supplies. KW

## 2020-03-09 DIAGNOSIS — M158 Other polyosteoarthritis: Secondary | ICD-10-CM | POA: Diagnosis not present

## 2020-03-09 DIAGNOSIS — I739 Peripheral vascular disease, unspecified: Secondary | ICD-10-CM | POA: Diagnosis not present

## 2020-03-09 DIAGNOSIS — M05471 Rheumatoid myopathy with rheumatoid arthritis of right ankle and foot: Secondary | ICD-10-CM | POA: Diagnosis not present

## 2020-03-09 DIAGNOSIS — G809 Cerebral palsy, unspecified: Secondary | ICD-10-CM | POA: Diagnosis not present

## 2020-03-09 DIAGNOSIS — I130 Hypertensive heart and chronic kidney disease with heart failure and stage 1 through stage 4 chronic kidney disease, or unspecified chronic kidney disease: Secondary | ICD-10-CM | POA: Diagnosis not present

## 2020-03-09 DIAGNOSIS — N183 Chronic kidney disease, stage 3 unspecified: Secondary | ICD-10-CM | POA: Diagnosis not present

## 2020-03-09 DIAGNOSIS — I5042 Chronic combined systolic (congestive) and diastolic (congestive) heart failure: Secondary | ICD-10-CM | POA: Diagnosis not present

## 2020-03-09 DIAGNOSIS — G8929 Other chronic pain: Secondary | ICD-10-CM | POA: Diagnosis not present

## 2020-03-09 DIAGNOSIS — M05741 Rheumatoid arthritis with rheumatoid factor of right hand without organ or systems involvement: Secondary | ICD-10-CM | POA: Diagnosis not present

## 2020-03-12 ENCOUNTER — Telehealth: Payer: Self-pay | Admitting: *Deleted

## 2020-03-12 DIAGNOSIS — I739 Peripheral vascular disease, unspecified: Secondary | ICD-10-CM | POA: Diagnosis not present

## 2020-03-12 DIAGNOSIS — I5042 Chronic combined systolic (congestive) and diastolic (congestive) heart failure: Secondary | ICD-10-CM | POA: Diagnosis not present

## 2020-03-12 DIAGNOSIS — M05471 Rheumatoid myopathy with rheumatoid arthritis of right ankle and foot: Secondary | ICD-10-CM | POA: Diagnosis not present

## 2020-03-12 DIAGNOSIS — G809 Cerebral palsy, unspecified: Secondary | ICD-10-CM | POA: Diagnosis not present

## 2020-03-12 DIAGNOSIS — I130 Hypertensive heart and chronic kidney disease with heart failure and stage 1 through stage 4 chronic kidney disease, or unspecified chronic kidney disease: Secondary | ICD-10-CM | POA: Diagnosis not present

## 2020-03-12 DIAGNOSIS — G8929 Other chronic pain: Secondary | ICD-10-CM | POA: Diagnosis not present

## 2020-03-12 DIAGNOSIS — N183 Chronic kidney disease, stage 3 unspecified: Secondary | ICD-10-CM | POA: Diagnosis not present

## 2020-03-12 DIAGNOSIS — M05741 Rheumatoid arthritis with rheumatoid factor of right hand without organ or systems involvement: Secondary | ICD-10-CM | POA: Diagnosis not present

## 2020-03-12 DIAGNOSIS — M158 Other polyosteoarthritis: Secondary | ICD-10-CM | POA: Diagnosis not present

## 2020-03-12 DIAGNOSIS — I2510 Atherosclerotic heart disease of native coronary artery without angina pectoris: Secondary | ICD-10-CM | POA: Diagnosis not present

## 2020-03-12 NOTE — Chronic Care Management (AMB) (Signed)
  Chronic Care Management   Note  03/12/2020 Name: KAIRI HARSHBARGER MRN: [id number] DOB: 1945-11-22  JASMANE BROCKWAY is a 74 y.o. year old female who is a primary care patient of Caryn Section, Kirstie Peri, MD. I reached out to Barbra Sarks by phone today in response to a referral sent by Ms. Malachy Moan Bednarz's health plan.     Ms. Breach was given information about Chronic Care Management services today including:  1. CCM service includes personalized support from designated clinical staff supervised by her physician, including individualized plan of care and coordination with other care providers 2. 24/7 contact phone numbers for assistance for urgent and routine care needs. 3. Service will only be billed when office clinical staff spend 20 minutes or more in a month to coordinate care. 4. Only one practitioner may furnish and bill the service in a calendar month. 5. The patient may stop CCM services at any time (effective at the end of the month) by phone call to the office staff. 6. The patient will be responsible for cost sharing (co-pay) of up to 20% of the service fee (after annual deductible is met).  Patient agreed to services and verbal consent obtained.   Follow up plan: Telephone appointment with care management team member scheduled for: 03/23/2020  [place], [place], [place] [address] Direct Dial: [email or web address] Website: [email or web address]

## 2020-03-15 DIAGNOSIS — G809 Cerebral palsy, unspecified: Secondary | ICD-10-CM | POA: Diagnosis not present

## 2020-03-15 DIAGNOSIS — M05471 Rheumatoid myopathy with rheumatoid arthritis of right ankle and foot: Secondary | ICD-10-CM | POA: Diagnosis not present

## 2020-03-15 DIAGNOSIS — M158 Other polyosteoarthritis: Secondary | ICD-10-CM | POA: Diagnosis not present

## 2020-03-15 DIAGNOSIS — I5042 Chronic combined systolic (congestive) and diastolic (congestive) heart failure: Secondary | ICD-10-CM | POA: Diagnosis not present

## 2020-03-15 DIAGNOSIS — G8929 Other chronic pain: Secondary | ICD-10-CM | POA: Diagnosis not present

## 2020-03-15 DIAGNOSIS — I739 Peripheral vascular disease, unspecified: Secondary | ICD-10-CM | POA: Diagnosis not present

## 2020-03-15 DIAGNOSIS — N183 Chronic kidney disease, stage 3 unspecified: Secondary | ICD-10-CM | POA: Diagnosis not present

## 2020-03-15 DIAGNOSIS — M05741 Rheumatoid arthritis with rheumatoid factor of right hand without organ or systems involvement: Secondary | ICD-10-CM | POA: Diagnosis not present

## 2020-03-15 DIAGNOSIS — I130 Hypertensive heart and chronic kidney disease with heart failure and stage 1 through stage 4 chronic kidney disease, or unspecified chronic kidney disease: Secondary | ICD-10-CM | POA: Diagnosis not present

## 2020-03-17 DIAGNOSIS — K5900 Constipation, unspecified: Secondary | ICD-10-CM | POA: Diagnosis not present

## 2020-03-17 DIAGNOSIS — I739 Peripheral vascular disease, unspecified: Secondary | ICD-10-CM | POA: Diagnosis not present

## 2020-03-17 DIAGNOSIS — G809 Cerebral palsy, unspecified: Secondary | ICD-10-CM | POA: Diagnosis not present

## 2020-03-17 DIAGNOSIS — L89326 Pressure-induced deep tissue damage of left buttock: Secondary | ICD-10-CM | POA: Diagnosis not present

## 2020-03-17 DIAGNOSIS — I5042 Chronic combined systolic (congestive) and diastolic (congestive) heart failure: Secondary | ICD-10-CM | POA: Diagnosis not present

## 2020-03-17 DIAGNOSIS — N183 Chronic kidney disease, stage 3 unspecified: Secondary | ICD-10-CM | POA: Diagnosis not present

## 2020-03-17 DIAGNOSIS — D539 Nutritional anemia, unspecified: Secondary | ICD-10-CM | POA: Diagnosis not present

## 2020-03-17 DIAGNOSIS — M05741 Rheumatoid arthritis with rheumatoid factor of right hand without organ or systems involvement: Secondary | ICD-10-CM | POA: Diagnosis not present

## 2020-03-17 DIAGNOSIS — M05471 Rheumatoid myopathy with rheumatoid arthritis of right ankle and foot: Secondary | ICD-10-CM | POA: Diagnosis not present

## 2020-03-17 DIAGNOSIS — I130 Hypertensive heart and chronic kidney disease with heart failure and stage 1 through stage 4 chronic kidney disease, or unspecified chronic kidney disease: Secondary | ICD-10-CM | POA: Diagnosis not present

## 2020-03-17 DIAGNOSIS — G8929 Other chronic pain: Secondary | ICD-10-CM | POA: Diagnosis not present

## 2020-03-17 DIAGNOSIS — M158 Other polyosteoarthritis: Secondary | ICD-10-CM | POA: Diagnosis not present

## 2020-03-17 DIAGNOSIS — L89322 Pressure ulcer of left buttock, stage 2: Secondary | ICD-10-CM | POA: Diagnosis not present

## 2020-03-17 DIAGNOSIS — I890 Lymphedema, not elsewhere classified: Secondary | ICD-10-CM | POA: Diagnosis not present

## 2020-03-19 DIAGNOSIS — N183 Chronic kidney disease, stage 3 unspecified: Secondary | ICD-10-CM | POA: Diagnosis not present

## 2020-03-19 DIAGNOSIS — G8929 Other chronic pain: Secondary | ICD-10-CM | POA: Diagnosis not present

## 2020-03-19 DIAGNOSIS — I130 Hypertensive heart and chronic kidney disease with heart failure and stage 1 through stage 4 chronic kidney disease, or unspecified chronic kidney disease: Secondary | ICD-10-CM | POA: Diagnosis not present

## 2020-03-19 DIAGNOSIS — M05471 Rheumatoid myopathy with rheumatoid arthritis of right ankle and foot: Secondary | ICD-10-CM | POA: Diagnosis not present

## 2020-03-19 DIAGNOSIS — M05741 Rheumatoid arthritis with rheumatoid factor of right hand without organ or systems involvement: Secondary | ICD-10-CM | POA: Diagnosis not present

## 2020-03-19 DIAGNOSIS — I739 Peripheral vascular disease, unspecified: Secondary | ICD-10-CM | POA: Diagnosis not present

## 2020-03-19 DIAGNOSIS — I5042 Chronic combined systolic (congestive) and diastolic (congestive) heart failure: Secondary | ICD-10-CM | POA: Diagnosis not present

## 2020-03-19 DIAGNOSIS — M158 Other polyosteoarthritis: Secondary | ICD-10-CM | POA: Diagnosis not present

## 2020-03-19 DIAGNOSIS — G809 Cerebral palsy, unspecified: Secondary | ICD-10-CM | POA: Diagnosis not present

## 2020-03-23 ENCOUNTER — Telehealth: Payer: Medicare HMO

## 2020-03-23 DIAGNOSIS — M05471 Rheumatoid myopathy with rheumatoid arthritis of right ankle and foot: Secondary | ICD-10-CM | POA: Diagnosis not present

## 2020-03-23 DIAGNOSIS — N183 Chronic kidney disease, stage 3 unspecified: Secondary | ICD-10-CM | POA: Diagnosis not present

## 2020-03-23 DIAGNOSIS — G8929 Other chronic pain: Secondary | ICD-10-CM | POA: Diagnosis not present

## 2020-03-23 DIAGNOSIS — M158 Other polyosteoarthritis: Secondary | ICD-10-CM | POA: Diagnosis not present

## 2020-03-23 DIAGNOSIS — M05741 Rheumatoid arthritis with rheumatoid factor of right hand without organ or systems involvement: Secondary | ICD-10-CM | POA: Diagnosis not present

## 2020-03-23 DIAGNOSIS — I739 Peripheral vascular disease, unspecified: Secondary | ICD-10-CM | POA: Diagnosis not present

## 2020-03-23 DIAGNOSIS — I130 Hypertensive heart and chronic kidney disease with heart failure and stage 1 through stage 4 chronic kidney disease, or unspecified chronic kidney disease: Secondary | ICD-10-CM | POA: Diagnosis not present

## 2020-03-23 DIAGNOSIS — G809 Cerebral palsy, unspecified: Secondary | ICD-10-CM | POA: Diagnosis not present

## 2020-03-23 DIAGNOSIS — I5042 Chronic combined systolic (congestive) and diastolic (congestive) heart failure: Secondary | ICD-10-CM | POA: Diagnosis not present

## 2020-03-23 DIAGNOSIS — J449 Chronic obstructive pulmonary disease, unspecified: Secondary | ICD-10-CM | POA: Diagnosis not present

## 2020-03-23 NOTE — Telephone Encounter (Signed)
PT would like to increase the amount of day of her therapy / please advise

## 2020-03-24 ENCOUNTER — Telehealth: Payer: Self-pay

## 2020-03-24 NOTE — Telephone Encounter (Signed)
I called and spoke with patient. Patient is currently having PT twice a week. She says she wants to hold off on increasing the days of PT per week. Her physical therapist says that she doesn't think sh is strong enough for an increase in days per week and she doesn't  want to overdo it. Patient agrees to follow her Physical therapist recommendations and keep the frequency at twice a week for now.   Patient asked if we could have someone come out to her house to help her with ADL. An order for home health nursing was placed last month, so I called [facility] and spoke with [place] for an update. [place] advised me that a nurse came out to the patients home on 03/17/2020. [place] says that a CNA is what the patient needs to help with ADL. [place] request that we place an order for a home health aid. Order has been placed. Please review and sign.

## 2020-03-24 NOTE — Addendum Note (Signed)
Addended by: Randal Buba on: 03/24/2020 09:54 AM   Modules accepted: Orders

## 2020-03-24 NOTE — Telephone Encounter (Signed)
Copied from [place] [phone]. Topic: General - Other >> Mar 24, 2020  9:31 AM Lennox Solders wrote: Reason for CRM: pt said she forget to tell Kordelia Severin something. Pt did not want to elaborate with me

## 2020-03-25 DIAGNOSIS — I739 Peripheral vascular disease, unspecified: Secondary | ICD-10-CM | POA: Diagnosis not present

## 2020-03-25 DIAGNOSIS — I5042 Chronic combined systolic (congestive) and diastolic (congestive) heart failure: Secondary | ICD-10-CM | POA: Diagnosis not present

## 2020-03-25 DIAGNOSIS — G8929 Other chronic pain: Secondary | ICD-10-CM | POA: Diagnosis not present

## 2020-03-25 DIAGNOSIS — M158 Other polyosteoarthritis: Secondary | ICD-10-CM | POA: Diagnosis not present

## 2020-03-25 DIAGNOSIS — N183 Chronic kidney disease, stage 3 unspecified: Secondary | ICD-10-CM | POA: Diagnosis not present

## 2020-03-25 DIAGNOSIS — M05741 Rheumatoid arthritis with rheumatoid factor of right hand without organ or systems involvement: Secondary | ICD-10-CM | POA: Diagnosis not present

## 2020-03-25 DIAGNOSIS — M05471 Rheumatoid myopathy with rheumatoid arthritis of right ankle and foot: Secondary | ICD-10-CM | POA: Diagnosis not present

## 2020-03-25 DIAGNOSIS — I130 Hypertensive heart and chronic kidney disease with heart failure and stage 1 through stage 4 chronic kidney disease, or unspecified chronic kidney disease: Secondary | ICD-10-CM | POA: Diagnosis not present

## 2020-03-25 DIAGNOSIS — G809 Cerebral palsy, unspecified: Secondary | ICD-10-CM | POA: Diagnosis not present

## 2020-03-26 DIAGNOSIS — I739 Peripheral vascular disease, unspecified: Secondary | ICD-10-CM | POA: Diagnosis not present

## 2020-03-26 DIAGNOSIS — N183 Chronic kidney disease, stage 3 unspecified: Secondary | ICD-10-CM | POA: Diagnosis not present

## 2020-03-26 DIAGNOSIS — I130 Hypertensive heart and chronic kidney disease with heart failure and stage 1 through stage 4 chronic kidney disease, or unspecified chronic kidney disease: Secondary | ICD-10-CM | POA: Diagnosis not present

## 2020-03-26 DIAGNOSIS — I5042 Chronic combined systolic (congestive) and diastolic (congestive) heart failure: Secondary | ICD-10-CM | POA: Diagnosis not present

## 2020-03-26 DIAGNOSIS — G809 Cerebral palsy, unspecified: Secondary | ICD-10-CM | POA: Diagnosis not present

## 2020-03-26 DIAGNOSIS — M158 Other polyosteoarthritis: Secondary | ICD-10-CM | POA: Diagnosis not present

## 2020-03-26 DIAGNOSIS — G8929 Other chronic pain: Secondary | ICD-10-CM | POA: Diagnosis not present

## 2020-03-26 DIAGNOSIS — M05471 Rheumatoid myopathy with rheumatoid arthritis of right ankle and foot: Secondary | ICD-10-CM | POA: Diagnosis not present

## 2020-03-26 DIAGNOSIS — M05741 Rheumatoid arthritis with rheumatoid factor of right hand without organ or systems involvement: Secondary | ICD-10-CM | POA: Diagnosis not present

## 2020-03-30 DIAGNOSIS — G8929 Other chronic pain: Secondary | ICD-10-CM | POA: Diagnosis not present

## 2020-03-30 DIAGNOSIS — G809 Cerebral palsy, unspecified: Secondary | ICD-10-CM | POA: Diagnosis present

## 2020-03-30 DIAGNOSIS — Z955 Presence of coronary angioplasty implant and graft: Secondary | ICD-10-CM

## 2020-03-30 DIAGNOSIS — I959 Hypotension, unspecified: Secondary | ICD-10-CM | POA: Diagnosis not present

## 2020-03-30 DIAGNOSIS — T502X5A Adverse effect of carbonic-anhydrase inhibitors, benzothiadiazides and other diuretics, initial encounter: Secondary | ICD-10-CM | POA: Diagnosis present

## 2020-03-30 DIAGNOSIS — R079 Chest pain, unspecified: Secondary | ICD-10-CM | POA: Diagnosis not present

## 2020-03-30 DIAGNOSIS — Z96652 Presence of left artificial knee joint: Secondary | ICD-10-CM | POA: Diagnosis present

## 2020-03-30 DIAGNOSIS — E43 Unspecified severe protein-calorie malnutrition: Secondary | ICD-10-CM | POA: Diagnosis present

## 2020-03-30 DIAGNOSIS — I214 Non-ST elevation (NSTEMI) myocardial infarction: Secondary | ICD-10-CM | POA: Diagnosis not present

## 2020-03-30 DIAGNOSIS — N183 Chronic kidney disease, stage 3 unspecified: Secondary | ICD-10-CM | POA: Diagnosis present

## 2020-03-30 DIAGNOSIS — M059 Rheumatoid arthritis with rheumatoid factor, unspecified: Secondary | ICD-10-CM | POA: Diagnosis present

## 2020-03-30 DIAGNOSIS — R0902 Hypoxemia: Secondary | ICD-10-CM | POA: Diagnosis not present

## 2020-03-30 DIAGNOSIS — I420 Dilated cardiomyopathy: Secondary | ICD-10-CM | POA: Diagnosis present

## 2020-03-30 DIAGNOSIS — R0789 Other chest pain: Secondary | ICD-10-CM | POA: Diagnosis not present

## 2020-03-30 DIAGNOSIS — Z681 Body mass index (BMI) 19 or less, adult: Secondary | ICD-10-CM

## 2020-03-30 DIAGNOSIS — Z7952 Long term (current) use of systemic steroids: Secondary | ICD-10-CM

## 2020-03-30 DIAGNOSIS — I5042 Chronic combined systolic (congestive) and diastolic (congestive) heart failure: Secondary | ICD-10-CM | POA: Diagnosis present

## 2020-03-30 DIAGNOSIS — I255 Ischemic cardiomyopathy: Secondary | ICD-10-CM | POA: Diagnosis present

## 2020-03-30 DIAGNOSIS — Z20822 Contact with and (suspected) exposure to covid-19: Secondary | ICD-10-CM | POA: Diagnosis present

## 2020-03-30 DIAGNOSIS — Z7982 Long term (current) use of aspirin: Secondary | ICD-10-CM

## 2020-03-30 DIAGNOSIS — I130 Hypertensive heart and chronic kidney disease with heart failure and stage 1 through stage 4 chronic kidney disease, or unspecified chronic kidney disease: Secondary | ICD-10-CM | POA: Diagnosis present

## 2020-03-30 DIAGNOSIS — I739 Peripheral vascular disease, unspecified: Secondary | ICD-10-CM | POA: Diagnosis not present

## 2020-03-30 DIAGNOSIS — Z7401 Bed confinement status: Secondary | ICD-10-CM

## 2020-03-30 DIAGNOSIS — M05471 Rheumatoid myopathy with rheumatoid arthritis of right ankle and foot: Secondary | ICD-10-CM | POA: Diagnosis not present

## 2020-03-30 DIAGNOSIS — E876 Hypokalemia: Secondary | ICD-10-CM | POA: Diagnosis present

## 2020-03-30 DIAGNOSIS — M158 Other polyosteoarthritis: Secondary | ICD-10-CM | POA: Diagnosis not present

## 2020-03-30 DIAGNOSIS — I25118 Atherosclerotic heart disease of native coronary artery with other forms of angina pectoris: Secondary | ICD-10-CM | POA: Diagnosis present

## 2020-03-30 DIAGNOSIS — Z7983 Long term (current) use of bisphosphonates: Secondary | ICD-10-CM

## 2020-03-30 DIAGNOSIS — Z79899 Other long term (current) drug therapy: Secondary | ICD-10-CM

## 2020-03-30 DIAGNOSIS — M05741 Rheumatoid arthritis with rheumatoid factor of right hand without organ or systems involvement: Secondary | ICD-10-CM | POA: Diagnosis not present

## 2020-03-30 DIAGNOSIS — I480 Paroxysmal atrial fibrillation: Secondary | ICD-10-CM | POA: Diagnosis present

## 2020-03-30 DIAGNOSIS — I252 Old myocardial infarction: Secondary | ICD-10-CM

## 2020-03-30 NOTE — Telephone Encounter (Signed)
Pt called regarding speaking with Nurse regarding a wick. Please advise .

## 2020-03-31 ENCOUNTER — Emergency Department: Payer: Medicare HMO

## 2020-03-31 ENCOUNTER — Inpatient Hospital Stay
Admission: EM | Admit: 2020-03-31 | Discharge: 2020-04-02 | DRG: 280 | Disposition: A | Discharge status: Home / Self Care | Payer: Medicare HMO | Attending: Internal Medicine | Admitting: Internal Medicine

## 2020-03-31 ENCOUNTER — Inpatient Hospital Stay
Admit: 2020-03-31 | Discharge: 2020-03-31 | Disposition: A | Discharge status: Home / Self Care | Payer: Medicare HMO | Attending: Physician Assistant | Admitting: Physician Assistant

## 2020-03-31 ENCOUNTER — Other Ambulatory Visit: Payer: Self-pay

## 2020-03-31 DIAGNOSIS — M059 Rheumatoid arthritis with rheumatoid factor, unspecified: Secondary | ICD-10-CM | POA: Diagnosis present

## 2020-03-31 DIAGNOSIS — I480 Paroxysmal atrial fibrillation: Secondary | ICD-10-CM | POA: Diagnosis not present

## 2020-03-31 DIAGNOSIS — Z7952 Long term (current) use of systemic steroids: Secondary | ICD-10-CM | POA: Diagnosis not present

## 2020-03-31 DIAGNOSIS — Z96652 Presence of left artificial knee joint: Secondary | ICD-10-CM | POA: Diagnosis present

## 2020-03-31 DIAGNOSIS — I25118 Atherosclerotic heart disease of native coronary artery with other forms of angina pectoris: Secondary | ICD-10-CM | POA: Diagnosis not present

## 2020-03-31 DIAGNOSIS — M2550 Pain in unspecified joint: Secondary | ICD-10-CM | POA: Diagnosis not present

## 2020-03-31 DIAGNOSIS — N183 Chronic kidney disease, stage 3 unspecified: Secondary | ICD-10-CM | POA: Diagnosis present

## 2020-03-31 DIAGNOSIS — R079 Chest pain, unspecified: Secondary | ICD-10-CM

## 2020-03-31 DIAGNOSIS — I5042 Chronic combined systolic (congestive) and diastolic (congestive) heart failure: Secondary | ICD-10-CM | POA: Diagnosis not present

## 2020-03-31 DIAGNOSIS — J181 Lobar pneumonia, unspecified organism: Secondary | ICD-10-CM | POA: Diagnosis not present

## 2020-03-31 DIAGNOSIS — I255 Ischemic cardiomyopathy: Secondary | ICD-10-CM | POA: Diagnosis present

## 2020-03-31 DIAGNOSIS — Z7982 Long term (current) use of aspirin: Secondary | ICD-10-CM | POA: Diagnosis not present

## 2020-03-31 DIAGNOSIS — Z79899 Other long term (current) drug therapy: Secondary | ICD-10-CM | POA: Diagnosis not present

## 2020-03-31 DIAGNOSIS — E43 Unspecified severe protein-calorie malnutrition: Secondary | ICD-10-CM | POA: Diagnosis present

## 2020-03-31 DIAGNOSIS — I214 Non-ST elevation (NSTEMI) myocardial infarction: Secondary | ICD-10-CM

## 2020-03-31 DIAGNOSIS — I428 Other cardiomyopathies: Secondary | ICD-10-CM

## 2020-03-31 DIAGNOSIS — Z20822 Contact with and (suspected) exposure to covid-19: Secondary | ICD-10-CM | POA: Diagnosis not present

## 2020-03-31 DIAGNOSIS — T502X5A Adverse effect of carbonic-anhydrase inhibitors, benzothiadiazides and other diuretics, initial encounter: Secondary | ICD-10-CM | POA: Diagnosis present

## 2020-03-31 DIAGNOSIS — I959 Hypotension, unspecified: Secondary | ICD-10-CM | POA: Diagnosis not present

## 2020-03-31 DIAGNOSIS — R0789 Other chest pain: Secondary | ICD-10-CM | POA: Diagnosis not present

## 2020-03-31 DIAGNOSIS — Z955 Presence of coronary angioplasty implant and graft: Secondary | ICD-10-CM | POA: Diagnosis not present

## 2020-03-31 DIAGNOSIS — Z7401 Bed confinement status: Secondary | ICD-10-CM | POA: Diagnosis not present

## 2020-03-31 DIAGNOSIS — M069 Rheumatoid arthritis, unspecified: Secondary | ICD-10-CM | POA: Diagnosis not present

## 2020-03-31 DIAGNOSIS — I872 Venous insufficiency (chronic) (peripheral): Secondary | ICD-10-CM | POA: Diagnosis present

## 2020-03-31 DIAGNOSIS — I420 Dilated cardiomyopathy: Secondary | ICD-10-CM | POA: Diagnosis not present

## 2020-03-31 DIAGNOSIS — I2510 Atherosclerotic heart disease of native coronary artery without angina pectoris: Secondary | ICD-10-CM | POA: Diagnosis not present

## 2020-03-31 DIAGNOSIS — E876 Hypokalemia: Secondary | ICD-10-CM | POA: Diagnosis present

## 2020-03-31 DIAGNOSIS — Z681 Body mass index (BMI) 19 or less, adult: Secondary | ICD-10-CM | POA: Diagnosis not present

## 2020-03-31 DIAGNOSIS — I130 Hypertensive heart and chronic kidney disease with heart failure and stage 1 through stage 4 chronic kidney disease, or unspecified chronic kidney disease: Secondary | ICD-10-CM | POA: Diagnosis not present

## 2020-03-31 DIAGNOSIS — J90 Pleural effusion, not elsewhere classified: Secondary | ICD-10-CM | POA: Diagnosis not present

## 2020-03-31 DIAGNOSIS — Z7983 Long term (current) use of bisphosphonates: Secondary | ICD-10-CM | POA: Diagnosis not present

## 2020-03-31 DIAGNOSIS — I252 Old myocardial infarction: Secondary | ICD-10-CM | POA: Diagnosis not present

## 2020-03-31 DIAGNOSIS — G809 Cerebral palsy, unspecified: Secondary | ICD-10-CM | POA: Diagnosis not present

## 2020-03-31 LAB — BASIC METABOLIC PANEL
Anion gap: 11 (ref 5–15)
Anion gap: 9 (ref 5–15)
BUN: 13 mg/dL (ref 8–23)
BUN: 12 mg/dL (ref 8–23)
CO2: 21 mmol/L — ABNORMAL LOW (ref 22–32)
CO2: 21 mmol/L — ABNORMAL LOW (ref 22–32)
Calcium: 8.1 mg/dL — ABNORMAL LOW (ref 8.9–10.3)
Calcium: 8.1 mg/dL — ABNORMAL LOW (ref 8.9–10.3)
Chloride: 104 mmol/L (ref 98–111)
Chloride: 106 mmol/L (ref 98–111)
Creatinine, Ser: 0.71 mg/dL (ref 0.44–1.00)
Creatinine, Ser: 0.79 mg/dL (ref 0.44–1.00)
GFR calc Af Amer: >60 mL/min (ref >60)
GFR calc Af Amer: >60 mL/min (ref >60)
GFR calc non Af Amer: >60 mL/min (ref >60)
GFR calc non Af Amer: >60 mL/min (ref >60)
Glucose, Bld: 147 mg/dL — ABNORMAL HIGH (ref 70–99)
Glucose, Bld: 139 mg/dL — ABNORMAL HIGH (ref 70–99)
Potassium: 2.7 mmol/L — CL (ref 3.5–5.1)
Potassium: 4.6 mmol/L (ref 3.5–5.1)
Sodium: 136 mmol/L (ref 135–145)
Sodium: 136 mmol/L (ref 135–145)

## 2020-03-31 LAB — ECHOCARDIOGRAM COMPLETE
AR max vel: 0.77 cm2
AV Area VTI: 0.66 cm2
AV Area mean vel: 0.81 cm2
AV Mean grad: 4 mmHg
AV Peak grad: 7.7 mmHg
Ao pk vel: 1.39 m/s
Area-P 1/2: 3.26 cm2
Height: 60 in
S' Lateral: 5.1 cm
Weight: 1280 oz

## 2020-03-31 LAB — CBC
HCT: 34.5 % — ABNORMAL LOW (ref 36.0–46.0)
Hemoglobin: 10.6 g/dL — ABNORMAL LOW (ref 12.0–15.0)
MCH: 27.8 pg (ref 26.0–34.0)
MCHC: 30.7 g/dL (ref 30.0–36.0)
MCV: 90.6 fL (ref 80.0–100.0)
Platelets: 222 10*3/uL (ref 150–400)
RBC: 3.81 MIL/uL — ABNORMAL LOW (ref 3.87–5.11)
RDW: 16.9 % — ABNORMAL HIGH (ref 11.5–15.5)
WBC: 8 10*3/uL (ref 4.0–10.5)
nRBC: 0 % (ref 0.0–0.2)

## 2020-03-31 LAB — TROPONIN I (HIGH SENSITIVITY)
Troponin I (High Sensitivity): 74 ng/L — ABNORMAL HIGH (ref <18)
Troponin I (High Sensitivity): 201 ng/L (ref <18)
Troponin I (High Sensitivity): 296 ng/L (ref <18)
Troponin I (High Sensitivity): 342 ng/L (ref <18)

## 2020-03-31 LAB — MAGNESIUM: Magnesium: 2 mg/dL (ref 1.7–2.4)

## 2020-03-31 LAB — PROTIME-INR
INR: 1.2 (ref 0.8–1.2)
Prothrombin Time: 14.6 seconds (ref 11.4–15.2)

## 2020-03-31 LAB — APTT: aPTT: 25 seconds (ref 24–36)

## 2020-03-31 LAB — HEPARIN LEVEL (UNFRACTIONATED): Heparin Unfractionated: <0.1 IU/mL — ABNORMAL LOW (ref 0.30–0.70)

## 2020-03-31 LAB — SARS CORONAVIRUS 2 BY RT PCR (HOSPITAL ORDER, PERFORMED IN ~~LOC~~ HOSPITAL LAB): SARS Coronavirus 2: NEGATIVE

## 2020-03-31 IMAGING — CR DG CHEST 1V
1 series · 1 of 1 positions shown · non-contrast
Comparison: [DATE], CT [DATE]

CLINICAL DATA: Left-sided chest pain

EXAM:
CHEST  1 VIEW

[chest ap]
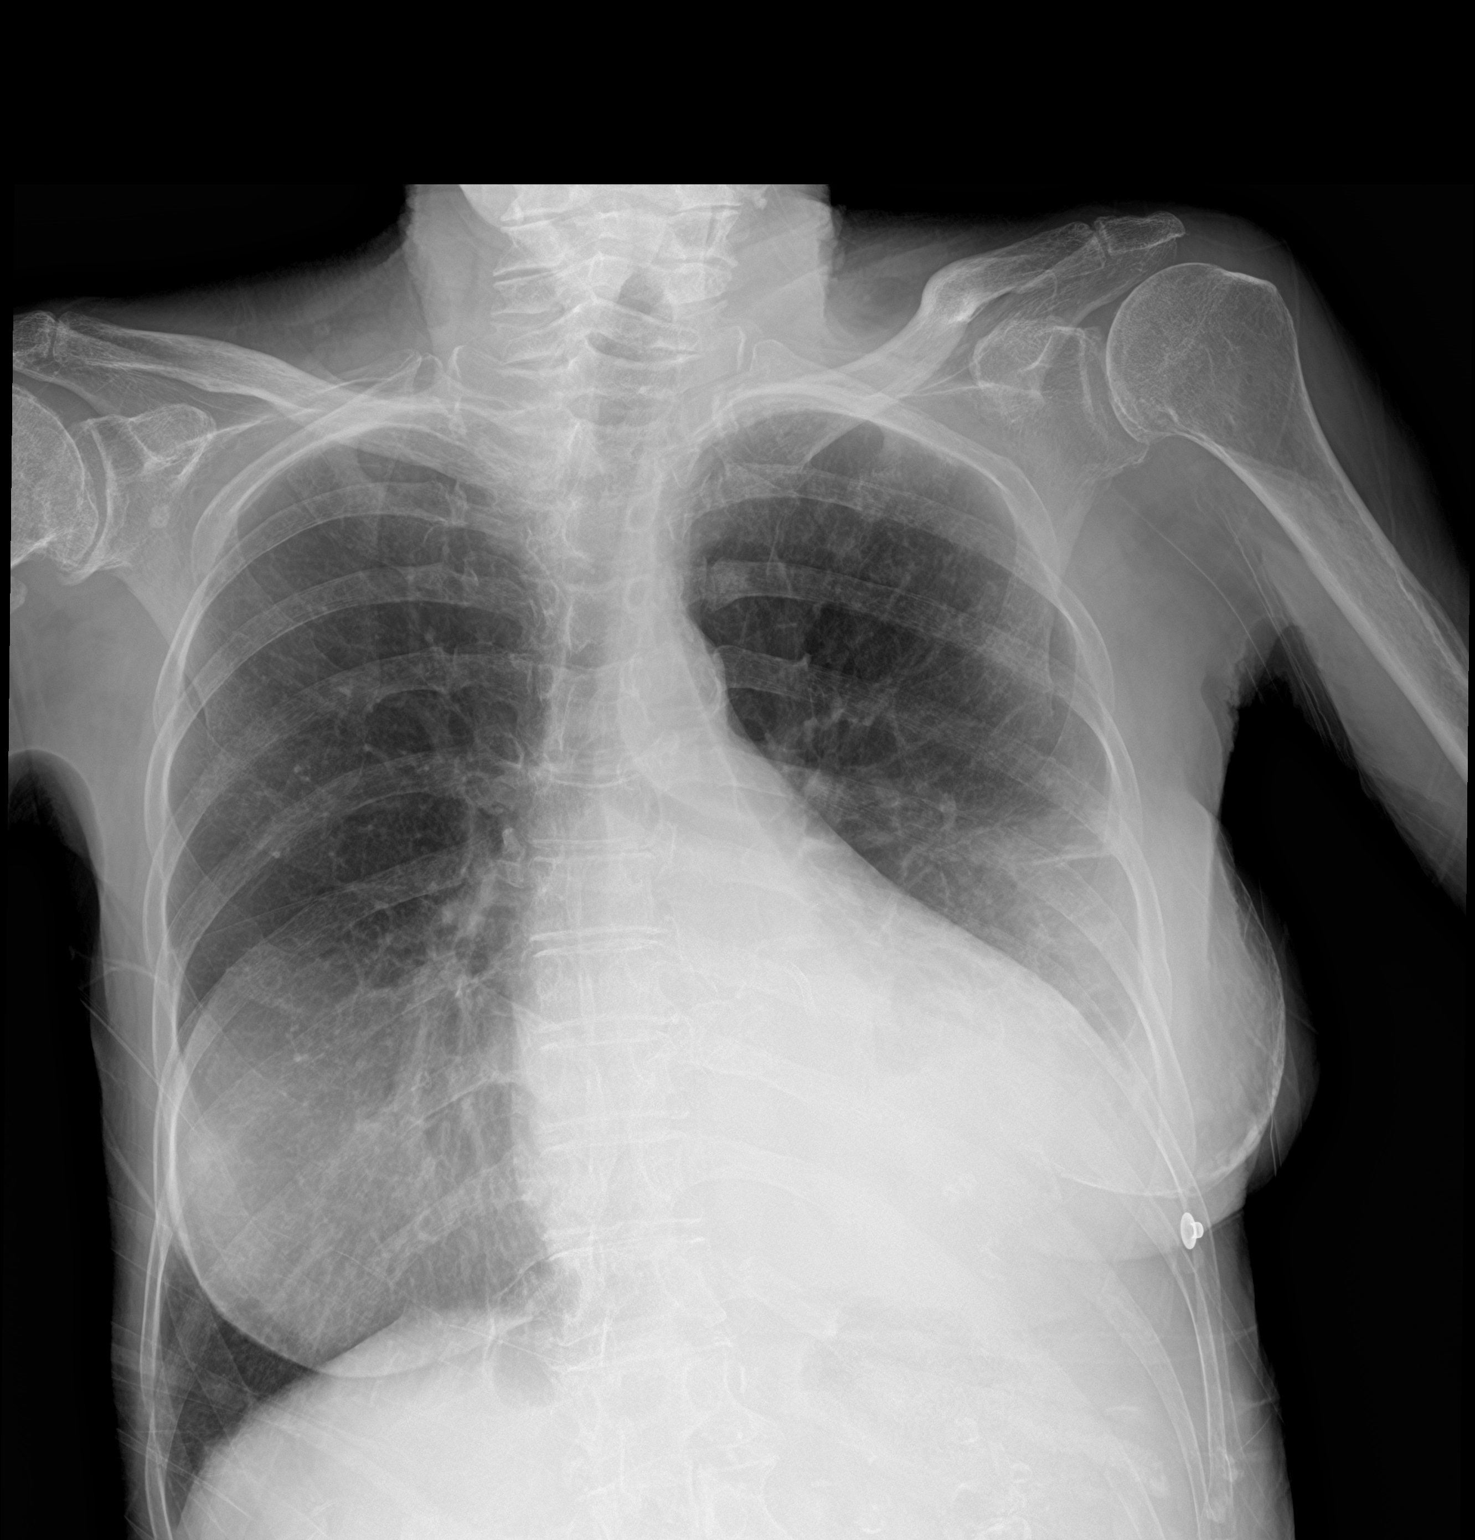

[1 of 1 positions shown; findings below may reference images not displayed]

FINDINGS: Bilateral breast implants. Small left-sided pleural effusion with
basilar consolidation. Right lung grossly clear. Borderline to mild
cardiomegaly with aortic atherosclerosis. No pneumothorax.
IMPRESSION: Small left-sided pleural effusion with left basilar consolidation,
either reflecting atelectasis or pneumonia.

## 2020-03-31 MED ORDER — ENSURE ENLIVE PO LIQD
237.0000 mL | Freq: Two times a day (BID) | ORAL | Status: DC
  Administered 2020-04-01 – 2020-04-02 (×2): 237 mL via ORAL

## 2020-03-31 MED ORDER — HYDROXYCHLOROQUINE SULFATE 200 MG PO TABS
400.0000 mg | ORAL_TABLET | Freq: Every day | ORAL | Status: DC
  Administered 2020-03-31 – 2020-04-02 (×2): 400 mg via ORAL
  Filled 2020-03-31 (×3): qty 2

## 2020-03-31 MED ORDER — HEPARIN BOLUS VIA INFUSION
1100.0000 [IU] | Freq: Once | INTRAVENOUS | Status: AC
  Administered 2020-03-31: 1100 [IU] via INTRAVENOUS
  Filled 2020-03-31: qty 1100

## 2020-03-31 MED ORDER — ONDANSETRON HCL 4 MG/2ML IJ SOLN: 4.0000 mg | Freq: Four times a day (QID) | INTRAMUSCULAR | Status: DC | PRN

## 2020-03-31 MED ORDER — MORPHINE SULFATE (PF) 2 MG/ML IV SOLN
1.0000 mg | Freq: Once | INTRAVENOUS | Status: AC
  Administered 2020-03-31: 1 mg via INTRAVENOUS
  Filled 2020-03-31: qty 1

## 2020-03-31 MED ORDER — CALCIUM CARBONATE-VITAMIN D 500-200 MG-UNIT PO TABS
2.0000 | ORAL_TABLET | Freq: Every day | ORAL | Status: DC
  Administered 2020-03-31 – 2020-04-02 (×3): 2 via ORAL
  Filled 2020-03-31 (×4): qty 2

## 2020-03-31 MED ORDER — SODIUM CHLORIDE 0.9% FLUSH: 3.0000 mL | INTRAVENOUS | Status: DC | PRN

## 2020-03-31 MED ORDER — SODIUM CHLORIDE 0.9% FLUSH
3.0000 mL | Freq: Two times a day (BID) | INTRAVENOUS | Status: DC
  Administered 2020-04-01: 3 mL via INTRAVENOUS

## 2020-03-31 MED ORDER — SODIUM CHLORIDE 0.9 % IV SOLN: 250.0000 mL | INTRAVENOUS | Status: DC | PRN

## 2020-03-31 MED ORDER — ISOSORBIDE MONONITRATE ER 30 MG PO TB24
30.0000 mg | ORAL_TABLET | Freq: Every day | ORAL | Status: DC
  Administered 2020-03-31 – 2020-04-02 (×3): 30 mg via ORAL
  Filled 2020-03-31 (×4): qty 1

## 2020-03-31 MED ORDER — AMIODARONE HCL 200 MG PO TABS
200.0000 mg | ORAL_TABLET | Freq: Every day | ORAL | Status: DC
  Administered 2020-03-31 – 2020-04-02 (×3): 200 mg via ORAL
  Filled 2020-03-31 (×4): qty 1

## 2020-03-31 MED ORDER — MIRTAZAPINE 15 MG PO TABS
30.0000 mg | ORAL_TABLET | Freq: Every day | ORAL | Status: DC
  Administered 2020-04-01: 30 mg via ORAL
  Filled 2020-03-31 (×2): qty 2

## 2020-03-31 MED ORDER — ACETAMINOPHEN 325 MG PO TABS
650.0000 mg | ORAL_TABLET | ORAL | Status: DC | PRN
  Filled 2020-03-31: qty 2

## 2020-03-31 MED ORDER — HEPARIN BOLUS VIA INFUSION
2100.0000 [IU] | Freq: Once | INTRAVENOUS | Status: AC
  Administered 2020-03-31: 2100 [IU] via INTRAVENOUS
  Filled 2020-03-31: qty 2100

## 2020-03-31 MED ORDER — NITROGLYCERIN 0.4 MG SL SUBL
0.4000 mg | SUBLINGUAL_TABLET | SUBLINGUAL | Status: DC | PRN
  Administered 2020-03-31: 0.4 mg via SUBLINGUAL
  Filled 2020-03-31: qty 1

## 2020-03-31 MED ORDER — ASPIRIN 81 MG PO TABS: 81.0000 mg | ORAL_TABLET | Freq: Every day | ORAL | Status: DC

## 2020-03-31 MED ORDER — OXYBUTYNIN CHLORIDE 5 MG PO TABS
5.0000 mg | ORAL_TABLET | Freq: Two times a day (BID) | ORAL | Status: DC | PRN
  Filled 2020-03-31: qty 1

## 2020-03-31 MED ORDER — POTASSIUM CHLORIDE CRYS ER 20 MEQ PO TBCR
40.0000 meq | EXTENDED_RELEASE_TABLET | Freq: Once | ORAL | Status: AC
  Administered 2020-03-31: 40 meq via ORAL
  Filled 2020-03-31: qty 2

## 2020-03-31 MED ORDER — IOHEXOL 350 MG/ML SOLN
60.0000 mL | Freq: Once | INTRAVENOUS | Status: AC | PRN
  Administered 2020-03-31: 60 mL via INTRAVENOUS

## 2020-03-31 MED ORDER — VITAMIN D 25 MCG (1000 UNIT) PO TABS
1000.0000 [IU] | ORAL_TABLET | Freq: Every day | ORAL | Status: DC
  Administered 2020-03-31 – 2020-04-02 (×3): 1000 [IU] via ORAL
  Filled 2020-03-31 (×3): qty 1

## 2020-03-31 MED ORDER — POTASSIUM CHLORIDE 20 MEQ PO PACK
40.0000 meq | PACK | Freq: Once | ORAL | Status: AC
  Administered 2020-03-31: 40 meq via ORAL
  Filled 2020-03-31: qty 2

## 2020-03-31 MED ORDER — FE FUMARATE-B12-VIT C-FA-IFC PO CAPS
1.0000 | ORAL_CAPSULE | Freq: Two times a day (BID) | ORAL | Status: DC
  Administered 2020-03-31 – 2020-04-02 (×4): 1 via ORAL
  Filled 2020-03-31 (×7): qty 1

## 2020-03-31 MED ORDER — SODIUM CHLORIDE 0.9% FLUSH
3.0000 mL | Freq: Two times a day (BID) | INTRAVENOUS | Status: DC
  Administered 2020-03-31: 3 mL via INTRAVENOUS

## 2020-03-31 MED ORDER — ASPIRIN EC 81 MG PO TBEC
81.0000 mg | DELAYED_RELEASE_TABLET | Freq: Every day | ORAL | Status: DC
  Administered 2020-04-02: 81 mg via ORAL
  Filled 2020-03-31: qty 1

## 2020-03-31 MED ORDER — FUROSEMIDE 20 MG PO TABS: 20.0000 mg | ORAL_TABLET | Freq: Every day | ORAL | Status: DC | PRN

## 2020-03-31 MED ORDER — ASPIRIN 81 MG PO CHEW
324.0000 mg | CHEWABLE_TABLET | Freq: Once | ORAL | Status: AC
  Administered 2020-03-31: 324 mg via ORAL
  Filled 2020-03-31: qty 4

## 2020-03-31 MED ORDER — HYDROCODONE-ACETAMINOPHEN 7.5-325 MG PO TABS
1.0000 | ORAL_TABLET | Freq: Three times a day (TID) | ORAL | Status: DC | PRN
  Administered 2020-04-01: 1 via ORAL
  Filled 2020-03-31: qty 1

## 2020-03-31 MED ORDER — HEPARIN (PORCINE) 25000 UT/250ML-% IV SOLN
700.0000 [IU]/h | INTRAVENOUS | Status: DC
  Administered 2020-03-31: 450 [IU]/h via INTRAVENOUS
  Filled 2020-03-31: qty 250

## 2020-03-31 MED ORDER — MORPHINE SULFATE (PF) 2 MG/ML IV SOLN
1.0000 mg | INTRAVENOUS | Status: DC | PRN
  Administered 2020-04-01: 1 mg via INTRAVENOUS
  Filled 2020-03-31: qty 1

## 2020-03-31 MED ORDER — MEGESTROL ACETATE 400 MG/10ML PO SUSP
400.0000 mg | Freq: Every day | ORAL | Status: DC
  Administered 2020-03-31 – 2020-04-02 (×2): 400 mg via ORAL
  Filled 2020-03-31 (×3): qty 10

## 2020-03-31 MED ORDER — CARVEDILOL 3.125 MG PO TABS
3.1250 mg | ORAL_TABLET | Freq: Two times a day (BID) | ORAL | Status: DC
  Administered 2020-04-01: 3.125 mg via ORAL
  Filled 2020-03-31 (×3): qty 1

## 2020-03-31 MED ORDER — PREDNISONE 2.5 MG PO TABS
2.5000 mg | ORAL_TABLET | Freq: Every day | ORAL | Status: DC
  Administered 2020-03-31 – 2020-04-02 (×3): 2.5 mg via ORAL
  Filled 2020-03-31 (×5): qty 1

## 2020-03-31 MED ORDER — PRAVASTATIN SODIUM 40 MG PO TABS
40.0000 mg | ORAL_TABLET | Freq: Every day | ORAL | Status: DC
  Administered 2020-03-31 – 2020-04-01 (×2): 40 mg via ORAL
  Filled 2020-03-31 (×2): qty 1

## 2020-03-31 MED ORDER — DOCUSATE SODIUM 100 MG PO CAPS
100.0000 mg | ORAL_CAPSULE | Freq: Every day | ORAL | Status: DC
  Administered 2020-03-31 – 2020-04-01 (×2): 100 mg via ORAL
  Filled 2020-03-31 (×3): qty 1

## 2020-03-31 NOTE — Consult Note (Signed)
[facility] Cardiology  CARDIOLOGY CONSULT NOTE  Patient ID: Tina Salinas MRN: [id number] DOB/AGE: 72-28-1947 72 y.o.  Admit date: 03/31/2020 Referring Physician [place] Primary Physician [place] Primary Cardiologist Fath Reason for Consultation chest pain  HPI: 74 year old female referred for evaluation of chest pain and elevated high-sensitivity troponin.  Patient has known coronary artery disease and ischemic cardiomyopathy.  She reportedly has undergone prior coronary stents (undocumented).  Underwent cardiac catheterization 05/16/2017 which revealed 60% stenosis proximal LAD, LVEF 25 to 35%.  She was recently hospitalized for acute on chronic systolic congestive heart failure 10/02/2019 at which time high-sensitivity troponin was mildly elevated (68, 79, 72), 2D echocardiogram revealed severely reduced left ventricular function, with LVEF 20 to 25%.  She reports she was in her usual of health until 2 days ago, when she fell asleep on her cell phone, experienced chest discomfort and location where the phone was situated.  Yesterday, she had recurrent chest discomfort in the same location, which was sharp in nature, without associated radiation, nausea, vomiting or shortness of breath.  The emergency room, ECG revealed sinus rhythm, with evidence of old anteroseptal MI.  CT of the chest did not reveal evidence for pulmonary emboli.  High sensitivity troponin was elevated, 74 and 201.  The patient reports her chest pain has improved though still present.  Review of systems complete and found to be negative unless listed above     Past Medical History:  Diagnosis Date  . Adenoma   . CAP (community acquired pneumonia) 11/16/2017  . Cerebral palsy ([place])   . CHF (congestive heart failure) ([place])   . Femur fracture ([place])   . History of blood transfusion   . Hypertension   . Osteoarthritis   . Osteoporosis   . Rheumatoid arthritis [facility])     Past Surgical History:  Procedure Laterality Date  . BREAST  ENHANCEMENT SURGERY Bilateral 1980  . COLONOSCOPY    . COLONOSCOPY WITH PROPOFOL N/A 06/18/2019   Procedure: COLONOSCOPY WITH PROPOFOL;  Surgeon: Robert Bellow, MD;  Location: [facility] ENDOSCOPY;  Service: Endoscopy;  Laterality: N/A;  . CORONARY ANGIOPLASTY WITH STENT PLACEMENT  07/13/2008, 08/19/2008  . ESOPHAGOGASTRODUODENOSCOPY (EGD) WITH PROPOFOL N/A 06/18/2019   Procedure: ESOPHAGOGASTRODUODENOSCOPY (EGD) WITH PROPOFOL;  Surgeon: Robert Bellow, MD;  Location: [facility] ENDOSCOPY;  Service: Endoscopy;  Laterality: N/A;  . HIP ARTHROPLASTY N/A 2000  . JOINT REPLACEMENT    . LEFT HEART CATH AND CORONARY ANGIOGRAPHY N/A 05/16/2017   Procedure: LEFT HEART CATH AND CORONARY ANGIOGRAPHY;  Surgeon: Teodoro Spray, MD;  Location: [place] CV LAB;  Service: Cardiovascular;  Laterality: N/A;  . TOTAL KNEE ARTHROPLASTY Left 09/2007, 01/2008    (Not in a hospital admission)  Social History   Socioeconomic History  . Marital status: Widowed    Spouse name: Not on file  . Number of children: Not on file  . Years of education: Not on file  . Highest education level: Not on file  Occupational History  . Not on file  Tobacco Use  . Smoking status: Never Smoker  . Smokeless tobacco: Never Used  Vaping Use  . Vaping Use: Never used  Substance and Sexual Activity  . Alcohol use: No  . Drug use: No  . Sexual activity: Not on file  Other Topics Concern  . Not on file  Social History Narrative  . Not on file   Social Determinants of Health   Financial Resource Strain:   . Difficulty of Paying Living Expenses: Not on  file  Food Insecurity:   . Worried About Charity fundraiser in the Last Year: Not on file  . Ran Out of Food in the Last Year: Not on file  Transportation Needs:   . Lack of Transportation (Medical): Not on file  . Lack of Transportation (Non-Medical): Not on file  Physical Activity:   . Days of Exercise per Week: Not on file  . Minutes of Exercise per Session: Not on  file  Stress:   . Feeling of Stress : Not on file  Social Connections:   . Frequency of Communication with Friends and Family: Not on file  . Frequency of Social Gatherings with Friends and Family: Not on file  . Attends Religious Services: Not on file  . Active Member of Clubs or Organizations: Not on file  . Attends Archivist Meetings: Not on file  . Marital Status: Not on file  Intimate Partner Violence:   . Fear of Current or Ex-Partner: Not on file  . Emotionally Abused: Not on file  . Physically Abused: Not on file  . Sexually Abused: Not on file    Family History  Problem Relation Age of Onset  . Alcohol abuse Father   . Raynaud syndrome Sister       Review of systems complete and found to be negative unless listed above      PHYSICAL EXAM  General: Well developed, well nourished, in no acute distress HEENT:  Normocephalic and atramatic Neck:  No JVD.  Lungs: Clear bilaterally to auscultation and percussion. Heart: HRRR . Normal S1 and S2 without gallops or murmurs.  Abdomen: Bowel sounds are positive, abdomen soft and non-tender  Msk:  Back normal, normal gait. Normal strength and tone for age. Extremities: No clubbing, cyanosis or edema.   Neuro: Alert and oriented X 3. Psych:  Good affect, responds appropriately  Labs:   Lab Results  Component Value Date   WBC 8.0 03/31/2020   HGB 10.6 (L) 03/31/2020   HCT 34.5 (L) 03/31/2020   MCV 90.6 03/31/2020   PLT 222 03/31/2020    Recent Labs  Lab 03/31/20 0021  NA 136  K 2.7*  CL 104  CO2 21*  BUN 13  CREATININE 0.71  CALCIUM 8.1*  GLUCOSE 147*   Lab Results  Component Value Date   TROPONINI 0.10 (HH) 11/16/2017    Lab Results  Component Value Date   CHOL 147 09/01/2019   CHOL 138 03/01/2018   CHOL 169 07/16/2015   Lab Results  Component Value Date   HDL 63 09/01/2019   HDL 67 03/01/2018   HDL 87 07/16/2015   Lab Results  Component Value Date   LDLCALC 70 09/01/2019    LDLCALC 58 03/01/2018   LDLCALC 71 07/16/2015   Lab Results  Component Value Date   TRIG 69 09/01/2019   TRIG 64 03/01/2018   TRIG 56 07/16/2015   Lab Results  Component Value Date   CHOLHDL 2.1 03/01/2018   CHOLHDL 1.9 07/16/2015   No results found for: LDLDIRECT    Radiology: DG Chest 1 View  Result Date: 03/31/2020 CLINICAL DATA:  Left-sided chest pain EXAM: CHEST  1 VIEW COMPARISON:  11/28/2019, CT 10/02/2019 FINDINGS: Bilateral breast implants. Small left-sided pleural effusion with basilar consolidation. Right lung grossly clear. Borderline to mild cardiomegaly with aortic atherosclerosis. No pneumothorax. IMPRESSION: Small left-sided pleural effusion with left basilar consolidation, either reflecting atelectasis or pneumonia. Electronically Signed   By: Madie Reno.D.  On: 03/31/2020 01:47   CT Angio Chest PE W and/or Wo Contrast  Result Date: 03/31/2020 CLINICAL DATA:  Left-sided chest pain for 2 days EXAM: CT ANGIOGRAPHY CHEST WITH CONTRAST TECHNIQUE: Multidetector CT imaging of the chest was performed using the standard protocol during bolus administration of intravenous contrast. Multiplanar CT image reconstructions and MIPs were obtained to evaluate the vascular anatomy. CONTRAST:  11mL OMNIPAQUE IOHEXOL 350 MG/ML SOLN COMPARISON:  10/02/2019, chest x-ray from earlier in the same day. FINDINGS: Cardiovascular: Atherosclerotic calcifications of the aorta are again identified. No aneurysmal dilatation or dissection is noted. Mild pericardial effusion is noted stable from the prior exam. Mild cardiac enlargement is seen. Coronary calcifications are noted. Pulmonary artery is well visualized within normal branching pattern. No filling defect to suggest pulmonary embolism is seen. Mediastinum/Nodes: Thoracic inlet is within normal limits. No sizable hilar or mediastinal adenopathy is noted. The esophagus as visualized is within normal limits. Lungs/Pleura: Right lung is well  aerated. Some very minimal right lower lobe infiltrate is seen. Mild left lower lobe atelectasis/infiltrate is noted with associated effusion. The overall appearance is similar to that seen on the prior exam. The remainder of the left lung is well aerated. Upper Abdomen: Visualized upper abdomen is unremarkable. Musculoskeletal: Bilateral breast implants are noted. Degenerative changes of the thoracic spine are seen. Review of the MIP images confirms the above findings. IMPRESSION: No evidence of pulmonary emboli. Recurrent left lower lobe atelectasis and associated effusion. Patchy right lower lobe opacity which may represent scarring from prior pneumonia. Aortic Atherosclerosis (ICD10-I70.0). Electronically Signed   By: Inez Catalina M.D.   On: 03/31/2020 06:01    EKG: Sinus rhythm with old anteroseptal MI  ASSESSMENT AND PLAN:   1.  Chest pain, with atypical features, nondiagnostic ECG, with mildly elevated high-sensitivity troponin 2.  Coronary artery disease, undocumented history of prior coronary stents, with 60% stenosis proximal LAD by prior cardiac catheterization 05/16/2017 3.  Ischemic cardiomyopathy, with LVEF 20 to 25% 4.  Chronic systolic congestive heart failure 5.  Paroxysmal atrial fibrillation, not on chronic anticoagulation due to falling risk, on amiodarone for rhythm control  Recommendations  1.  Continue current therapy 2.  Continue heparin drip for now 3.  Cycle additional high-sensitivity troponin x 2 4.  Review 2D echocardiogram 5.  Continue carvedilol 6.  Continue amiodarone 7.  Add long-acting nitrates 8.  Consider cardiac catheterization pending patient's initial clinical course, repeat high sensitivity troponin levels, and 2D echocardiogram  Signed: Isaias Cowman MD,PhD, [facility] 03/31/2020, 9:26 AM

## 2020-03-31 NOTE — ED Notes (Signed)
Pt requested to hold 2200 mirtazapine (remeron). Pt stated she wasn't ready for bed yet. Pt was questioning if that was a medication she took at home.

## 2020-03-31 NOTE — ED Notes (Signed)
Pt provided lunch tray at bedside. Pt did not eat any lunch, stated she was not hungry right now.

## 2020-03-31 NOTE — ED Notes (Signed)
MD Agbata at bedside ?
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
## 2020-03-31 NOTE — Progress Notes (Signed)
ANTICOAGULATION CONSULT NOTE - Initial Consult  Pharmacy Consult for Heparin Indication: ACS/STEMI  Allergies  Allergen Reactions  . Etodolac     Other reaction(s): Unknown  . Sulfa Antibiotics   . Tetracycline     Patient Measurements: Height: 5' (152.4 cm) Weight: 36.3 kg (80 lb) IBW/kg (Calculated) : 45.5 HEPARIN DW (KG): 36.3  Vital Signs: Temp: 98.5 F (36.9 C) (09/15 0015) Temp Source: Oral (09/15 0015) BP: 106/61 (09/15 0435) Pulse Rate: 67 (09/15 0435)  Labs: Recent Labs    03/31/20 0021 03/31/20 0308  HGB 10.6*  --   HCT 34.5*  --   PLT 222  --   CREATININE 0.71  --   TROPONINIHS 74* 201*    Estimated Creatinine Clearance: 36.4 mL/min (by C-G formula based on SCr of 0.71 mg/dL).   Medical History: Past Medical History:  Diagnosis Date  . Adenoma   . CAP (community acquired pneumonia) 11/16/2017  . Cerebral palsy ([place])   . CHF (congestive heart failure) ([place])   . Femur fracture ([place])   . History of blood transfusion   . Hypertension   . Osteoarthritis   . Osteoporosis   . Rheumatoid arthritis ([facility])     Medications:  (Not in a hospital admission)   Assessment: No anticoagulants PTA.  Heparin for ACS.  Baseline labs ordered.  Goal of Therapy:  Heparin level 0.3-0.7 units/ml Monitor platelets by anticoagulation protocol: Yes   Plan:  Heparin bolus 2100 units x 1 then infusion at 450 units/hr Check HL ~ 8 hours after heparin started  Hart Robinsons A 03/31/2020,5:38 AM

## 2020-03-31 NOTE — H&P (Signed)
History and Physical    Tina Salinas IFO:[id number] DOB: 09-06-1945 DOA: 03/31/2020  PCP: Birdie Sons, MD   Patient coming from: Home  I have personally briefly reviewed patient's old medical records in [place]  Chief Complaint: Chest pain  HPI: Tina Salinas is a 74 y.o. female with medical history significant for coronary artery disease status post stent angioplasty, history of CHF with preserved LVEF, peripheral vascular disease, rheumatoid arthritis, stage III chronic kidney disease and history of atrial fibrillation not on chronic anticoagulation therapy who presents to the ER for evaluation of chest pain.  Chest pain was mostly over the left anterior chest wall and has progressively worsened over the last 2 days but more the night prior to admission which prompted patient to call 911.  Chest pain was nonradiating but was associated with shortness of breath and she rated it a 6 x 10 in intensity at its worst.  She denies having any nausea, no vomiting, no diaphoresis or palpitations, no changes in her bowel habits or any urinary symptoms. Labs show sodium 136, potassium 2.7, chloride 104, bicarb 21, BUN 13, creatinine 0.71, calcium 8.1, magnesium 2.0, troponin 74 >> 201, white count 8, hemoglobin 10.6, hematocrit 34.5, MCV 90.6, RDW 16.9, platelet count 222, INR 1.2 CT angiogram of the chest shows no evidence of pulmonary emboli, recurrent left lower lobe atelectasis and associated effusion.  Patchy right lower lobe opacities which represent scarring from prior pneumonia. Chest x-ray reviewed by me shows small left-sided pleural effusion. Twelve-lead EKG reviewed by me shows sinus rhythm with a left anterior fascicular block.  ED Course: Patient is a 74 year old Caucasian female with a known history of coronary artery disease who presents to the ER for evaluation of a 2-day history of worsening left-sided chest pain associated with shortness of breath.  Twelve-lead EKG showed no  acute findings but patient bumped her troponin from 74 >> 201.  She is currently chest pain-free.  Patient was started on a heparin drip in the emergency room and will be admitted to the hospital for further evaluation.  Review of Systems: As per HPI otherwise 10 point review of systems negative.    Past Medical History:  Diagnosis Date  . Adenoma   . CAP (community acquired pneumonia) 11/16/2017  . Cerebral palsy ([place])   . CHF (congestive heart failure) ([place])   . Femur fracture ([place])   . History of blood transfusion   . Hypertension   . Osteoarthritis   . Osteoporosis   . Rheumatoid arthritis [facility])     Past Surgical History:  Procedure Laterality Date  . BREAST ENHANCEMENT SURGERY Bilateral 1980  . COLONOSCOPY    . COLONOSCOPY WITH PROPOFOL N/A 06/18/2019   Procedure: COLONOSCOPY WITH PROPOFOL;  Surgeon: Robert Bellow, MD;  Location: [facility] ENDOSCOPY;  Service: Endoscopy;  Laterality: N/A;  . CORONARY ANGIOPLASTY WITH STENT PLACEMENT  07/13/2008, 08/19/2008  . ESOPHAGOGASTRODUODENOSCOPY (EGD) WITH PROPOFOL N/A 06/18/2019   Procedure: ESOPHAGOGASTRODUODENOSCOPY (EGD) WITH PROPOFOL;  Surgeon: Robert Bellow, MD;  Location: [facility] ENDOSCOPY;  Service: Endoscopy;  Laterality: N/A;  . HIP ARTHROPLASTY N/A 2000  . JOINT REPLACEMENT    . LEFT HEART CATH AND CORONARY ANGIOGRAPHY N/A 05/16/2017   Procedure: LEFT HEART CATH AND CORONARY ANGIOGRAPHY;  Surgeon: Teodoro Spray, MD;  Location: [place] [facility];  Service: Cardiovascular;  Laterality: N/A;  . TOTAL KNEE ARTHROPLASTY Left 09/2007, 01/2008     reports that she has never smoked. She has never  used smokeless tobacco. She reports that she does not drink alcohol and does not use drugs.  Allergies  Allergen Reactions  . Etodolac     Other reaction(s): Unknown  . Sulfa Antibiotics   . Tetracycline     Family History  Problem Relation Age of Onset  . Alcohol abuse Father   . Raynaud syndrome Sister      Prior to  Admission medications   Medication Sig Start Date End Date Taking? Authorizing Provider  alendronate (FOSAMAX) 70 MG tablet Take 70 mg by mouth once a week.  03/30/14  Yes [provider]  amiodarone (PACERONE) 200 MG tablet Take 200 mg by mouth daily.   Yes [provider]  aspirin 81 MG tablet Take 81 mg by mouth daily.   Yes [provider]  calcium-vitamin D (OSCAL WITH D) 500-200 MG-UNIT tablet Take 2 tablets by mouth daily with breakfast.   Yes [provider]  carvedilol (COREG) 3.125 MG tablet Take 3.125 mg by mouth 2 (two) times daily. 03/27/20  Yes [provider]  cholecalciferol (VITAMIN D) 1000 UNITS tablet Take 1,000 Units by mouth daily.  04/25/10  Yes [provider]  docusate sodium (COLACE) 100 MG capsule Take 1 capsule (100 mg total) by mouth daily. 12/05/19  Yes Lavina Hamman, MD  ferrous [id number] C-folic acid (TRINSICON / FOLTRIN) capsule Take 1 capsule by mouth 2 (two) times daily after a meal. 12/04/19  Yes Lavina Hamman, MD  furosemide (LASIX) 20 MG tablet Take 1 tablet (20 mg total) by mouth daily as needed for fluid or edema (weight gain of 3lbs in 1 day or 5 lbs in 3 days). 12/05/19 12/04/20 Yes Lavina Hamman, MD  HYDROcodone-acetaminophen (NORCO) 7.5-325 MG tablet Take 1-2 tablets by mouth 3 (three) times daily as needed for moderate pain. 02/04/20  Yes Birdie Sons, MD  hydroxychloroquine (PLAQUENIL) 200 MG tablet Take 400 mg by mouth daily.    Yes [provider]  latanoprost (XALATAN) 0.005 % ophthalmic solution Place 1 drop into both eyes at bedtime.   Yes [provider]  megestrol (MEGACE) 400 MG/10ML suspension Take 10 mLs (400 mg total) by mouth daily. 02/05/20  Yes Birdie Sons, MD  Methotrexate, PF, 25 MG/0.5ML SOAJ Inject 1.4 mLs into the skin once a week.   Yes [provider]  mirtazapine (REMERON) 30 MG tablet Take 1 tablet (30 mg total) by mouth at bedtime.  06/06/19  Yes Birdie Sons, MD  oxybutynin (DITROPAN) 5 MG tablet TAKE 1 TABLET BY MOUTH TWICE DAILY AS NEEDED FOR BLADDER SPASMS 01/20/20  Yes Birdie Sons, MD  pravastatin (PRAVACHOL) 40 MG tablet Take 1 tablet (40 mg total) by mouth at bedtime. 07/23/19  Yes Birdie Sons, MD  predniSONE (DELTASONE) 2.5 MG tablet Take 2.5 mg by mouth daily with breakfast.   Yes [provider]  promethazine (PHENERGAN) 25 MG tablet Take 25 mg by mouth every 6 (six) hours as needed for nausea or vomiting.   Yes [provider]  Vitamin D, Ergocalciferol, (DRISDOL) 1.25 MG (50000 UNIT) CAPS capsule Take 1 capsule (50,000 Units total) by mouth every 7 (seven) days. 11/24/19  Yes Birdie Sons, MD  Calcium Carbonate Antacid (CALCIUM CARBONATE PO) Take 200 mg by mouth daily.  Patient not taking: Reported on 03/31/2020    [provider]  Methylcellulose, Laxative, (GNP FIBER THERAPY PO) Take 1 Dose by mouth daily as needed (constipation).  Patient not taking:  Reported on 03/31/2020    [provider]    Physical Exam: Vitals:   03/31/20 0500 03/31/20 0530 03/31/20 0600 03/31/20 0630  BP: 104/86 109/64 110/69 104/67  Pulse: 70 69 71 72  Resp: (!) 22 16 (!) 21 17  Temp:      TempSrc:      SpO2: 100% 97% 98% 100%  Weight:      Height:         Vitals:   03/31/20 0500 03/31/20 0530 03/31/20 0600 03/31/20 0630  BP: 104/86 109/64 110/69 104/67  Pulse: 70 69 71 72  Resp: (!) 22 16 (!) 21 17  Temp:      TempSrc:      SpO2: 100% 97% 98% 100%  Weight:      Height:        Constitutional: NAD, alert and oriented x 3.  Chronically ill-appearing, thin and frail Eyes: PERRL, lids and conjunctivae normal ENMT: Mucous membranes are moist.  Neck: normal, supple, no masses, no thyromegaly Respiratory: Bilateral air entry, no wheezing, no crackles. Normal respiratory effort. No accessory muscle use.  Cardiovascular: Regular rate and rhythm, no murmurs / rubs / gallops.   Right lower extremity swelling,  2+ pedal pulses. No carotid bruits.  Abdomen: no tenderness, no masses palpated. No hepatosplenomegaly. Bowel sounds positive.  Musculoskeletal: no clubbing / cyanosis. No joint deformity upper and lower extremities.  Skin: no rashes, lesions, ulcers.  Neurologic: No gross focal neurologic deficit.  Generalized weakness Psychiatric: Flat affect.   Labs on Admission: I have personally reviewed following labs and imaging studies  CBC: Recent Labs  Lab 03/31/20 0021  WBC 8.0  HGB 10.6*  HCT 34.5*  MCV 90.6  PLT 573   Basic Metabolic Panel: Recent Labs  Lab 03/31/20 0021 03/31/20 0453  NA 136  --   K 2.7*  --   CL 104  --   CO2 21*  --   GLUCOSE 147*  --   BUN 13  --   CREATININE 0.71  --   CALCIUM 8.1*  --   MG  --  2.0   GFR: Estimated Creatinine Clearance: 36.4 mL/min (by C-G formula based on SCr of 0.71 mg/dL). Liver Function Tests: No results for input(s): AST, ALT, ALKPHOS, BILITOT, PROT, ALBUMIN in the last 168 hours. No results for input(s): LIPASE, AMYLASE in the last 168 hours. No results for input(s): AMMONIA in the last 168 hours. Coagulation Profile: Recent Labs  Lab 03/31/20 0501  INR 1.2   Cardiac Enzymes: No results for input(s): CKTOTAL, CKMB, CKMBINDEX, TROPONINI in the last 168 hours. BNP (last 3 results) No results for input(s): PROBNP in the last 8760 hours. HbA1C: No results for input(s): HGBA1C in the last 72 hours. CBG: No results for input(s): GLUCAP in the last 168 hours. Lipid Profile: No results for input(s): CHOL, HDL, LDLCALC, TRIG, CHOLHDL, LDLDIRECT in the last 72 hours. Thyroid Function Tests: No results for input(s): TSH, T4TOTAL, FREET4, T3FREE, THYROIDAB in the last 72 hours. Anemia Panel: No results for input(s): VITAMINB12, FOLATE, FERRITIN, TIBC, IRON, RETICCTPCT in the last 72 hours. Urine analysis:    Component Value Date/Time   COLORURINE YELLOW (A) 10/08/2019 2100   APPEARANCEUR  CLEAR (A) 10/08/2019 2100   LABSPEC 1.008 10/08/2019 2100   PHURINE 7.0 10/08/2019 2100   GLUCOSEU NEGATIVE 10/08/2019 2100   HGBUR NEGATIVE 10/08/2019 2100   BILIRUBINUR NEGATIVE 10/08/2019 2100   [place] NEGATIVE 10/08/2019 2100   PROTEINUR NEGATIVE 10/08/2019 2100  NITRITE NEGATIVE 10/08/2019 2100   LEUKOCYTESUR NEGATIVE 10/08/2019 2100    Radiological Exams on Admission: DG Chest 1 View  Result Date: 03/31/2020 CLINICAL DATA:  Left-sided chest pain EXAM: CHEST  1 VIEW COMPARISON:  11/28/2019, CT 10/02/2019 FINDINGS: Bilateral breast implants. Small left-sided pleural effusion with basilar consolidation. Right lung grossly clear. Borderline to mild cardiomegaly with aortic atherosclerosis. No pneumothorax. IMPRESSION: Small left-sided pleural effusion with left basilar consolidation, either reflecting atelectasis or pneumonia. Electronically Signed   By: Donavan Foil M.D.   On: 03/31/2020 01:47   CT Angio Chest PE W and/or Wo Contrast  Result Date: 03/31/2020 CLINICAL DATA:  Left-sided chest pain for 2 days EXAM: CT ANGIOGRAPHY CHEST WITH CONTRAST TECHNIQUE: Multidetector CT imaging of the chest was performed using the standard protocol during bolus administration of intravenous contrast. Multiplanar CT image reconstructions and MIPs were obtained to evaluate the vascular anatomy. CONTRAST:  48mL OMNIPAQUE IOHEXOL 350 MG/ML SOLN COMPARISON:  10/02/2019, chest x-ray from earlier in the same day. FINDINGS: Cardiovascular: Atherosclerotic calcifications of the aorta are again identified. No aneurysmal dilatation or dissection is noted. Mild pericardial effusion is noted stable from the prior exam. Mild cardiac enlargement is seen. Coronary calcifications are noted. Pulmonary artery is well visualized within normal branching pattern. No filling defect to suggest pulmonary embolism is seen. Mediastinum/Nodes: Thoracic inlet is within normal limits. No sizable hilar or mediastinal adenopathy is  noted. The esophagus as visualized is within normal limits. Lungs/Pleura: Right lung is well aerated. Some very minimal right lower lobe infiltrate is seen. Mild left lower lobe atelectasis/infiltrate is noted with associated effusion. The overall appearance is similar to that seen on the prior exam. The remainder of the left lung is well aerated. Upper Abdomen: Visualized upper abdomen is unremarkable. Musculoskeletal: Bilateral breast implants are noted. Degenerative changes of the thoracic spine are seen. Review of the MIP images confirms the above findings. IMPRESSION: No evidence of pulmonary emboli. Recurrent left lower lobe atelectasis and associated effusion. Patchy right lower lobe opacity which may represent scarring from prior pneumonia. Aortic Atherosclerosis (ICD10-I70.0). Electronically Signed   By: Inez Catalina M.D.   On: 03/31/2020 06:01    EKG: Independently reviewed.  Sinus rhythm with left anterior fascicular block  Assessment/Plan Principal Problem:   NSTEMI (non-ST elevated myocardial infarction) ([place]) Active Problems:   PAF (paroxysmal atrial fibrillation) (HCC)   Rheumatoid arthritis with rheumatoid factor (HCC)   Chronic combined systolic and diastolic CHF (congestive heart failure) (HCC)   Chronic kidney disease (CKD), active medical management without dialysis, stage 3 (moderate)   Chronic venous insufficiency   CAD (coronary artery disease)   Non-ischemic cardiomyopathy (HCC)   Hypokalemia   Protein-calorie malnutrition, severe    Non-ST elevation MI Patient with a known history of coronary artery disease who presents to the ER for evaluation of left-sided chest pain associated with shortness of breath Twelve-lead EKG shows no acute findings Patient has a bump in her troponin from 74 >> 210 but is currently chest pain-free Continue heparin drip per protocol Continue aspirin, statins and beta-blockers We will consult cardiology   History of chronic combined  systolic and diastolic dysfunction CHF Not in acute exacerbation Last known LVEF of 20 to 25% Continue carvedilol and  Furosemide. Patient not on an ACE/ARB due to renal insufficiency   Hypokalemia Most likely related to diuretic use Supplement potassium   Stage III chronic kidney disease Stable  Monitor closely during this hospitalization   Moderate protein calorie malnutrition (BMI 15.62  kg/m2) Maintain low-sodium diet Administration of nutrition supplements Continue Megace    Paroxysmal atrial fibrillation Continue amiodarone and carvedilol for rate control Patient not on long-term anticoagulation therapy due to increased risk for fall   History of rheumatoid arthritis Continue Plaquenil Continue weekly methotrexate upon discharge   DVT prophylaxis: Heparin Code Status: Full code Family Communication: Greater than 50% of time was spent discussing plan of care with patient at the bedside.  She verbalizes understanding and agrees with the plan all questions and concerns have been addressed. Disposition Plan: Back to previous home environment Consults called: Cardiology    Elyana Grabski MD [facility]     03/31/2020, 9:02 AM

## 2020-03-31 NOTE — ED Notes (Signed)
Echo at bedside

## 2020-03-31 NOTE — Progress Notes (Signed)
[place] for Heparin Indication: ACS/STEMI  Allergies  Allergen Reactions  . Etodolac     Other reaction(s): Unknown  . Sulfa Antibiotics   . Tetracycline     Patient Measurements: Height: 5' (152.4 cm) Weight: 36.3 kg (80 lb) IBW/kg (Calculated) : 45.5 HEPARIN DW (KG): 36.3  Vital Signs: BP: 99/52 (09/15 1500) Pulse Rate: 72 (09/15 1500)  Labs: Recent Labs    03/31/20 0021 03/31/20 0021 03/31/20 0308 03/31/20 0501 03/31/20 1052 03/31/20 1356  HGB 10.6*  --   --   --   --   --   HCT 34.5*  --   --   --   --   --   PLT 222  --   --   --   --   --   APTT  --   --   --  25  --   --   LABPROT  --   --   --  14.6  --   --   INR  --   --   --  1.2  --   --   HEPARINUNFRC  --   --   --   --   --  <0.10*  CREATININE 0.71  --   --   --  0.79  --   TROPONINIHS 74*   < > 201*  --  296* 342*   < > = values in this interval not displayed.    Estimated Creatinine Clearance: 36.4 mL/min (by C-G formula based on SCr of 0.79 mg/dL).   Medical History: Past Medical History:  Diagnosis Date  . Adenoma   . CAP (community acquired pneumonia) 11/16/2017  . Cerebral palsy ([place])   . CHF (congestive heart failure) ([place])   . Femur fracture ([place])   . History of blood transfusion   . Hypertension   . Osteoarthritis   . Osteoporosis   . Rheumatoid arthritis (HCC)     Medications:  (Not in a hospital admission)   Assessment: No anticoagulants PTA.  Heparin for ACS.  Baseline labs ordered.  Heparin bolus 2100 units x 1 then infusion at 450 units/hr  Goal of Therapy:  Heparin level 0.3-0.7 units/ml Monitor platelets by anticoagulation protocol: Yes   Plan:  9/15 @ 1356 HL<0.10. subtherapeutic. Verified with RN that Drip is running and RN said the lab was drawn from opposite arm from Heparin drip. Will order Heparin bolus 1100 units x 1 then increase infusion to 550 units/hr reCheck HL in 8 hours.  Jemiah Cuadra A 03/31/2020,3:46  PM

## 2020-03-31 NOTE — ED Notes (Signed)
Breakfast provided to pt.

## 2020-03-31 NOTE — ED Triage Notes (Signed)
Pt to ED reporting left sided chest pain since yesterday. Pt reports the pain increases with inspiration. NO radiation and no cough. Pt reporting she slept on her phone two nights ago at the same spot the pain is located. NO bruising noted upon assessment. Hx of 2 MIs.

## 2020-03-31 NOTE — ED Notes (Signed)
PT yelling in room, this rn to room. PT requesting morphine, bed sore cream, to be turned, a call bell and anxiety medication. Informed pt her call bell was tied to her bed rail and that after I get report on pt's and get a second set of hands, will be happy to fulfill pt's requests.

## 2020-03-31 NOTE — ED Notes (Signed)
Pt provided w/ dinner tray at bedside. This RN assisted pt with eating.

## 2020-03-31 NOTE — Progress Notes (Signed)
*  PRELIMINARY RESULTS* Echocardiogram 2D Echocardiogram has been performed.  Tina Salinas 03/31/2020, 10:29 AM

## 2020-03-31 NOTE — ED Notes (Signed)
Pt changed at this time by this RN. Pt placed in new brief and is clean/dry at this time. Pt comfortable in bed , will continue to monitor.

## 2020-04-01 ENCOUNTER — Ambulatory Visit: Payer: Self-pay

## 2020-04-01 ENCOUNTER — Encounter
Admission: EM | Disposition: A | Discharge status: Home / Self Care | Payer: Self-pay | Source: Home / Self Care | Attending: Internal Medicine

## 2020-04-01 ENCOUNTER — Telehealth: Payer: Self-pay | Admitting: Family Medicine

## 2020-04-01 ENCOUNTER — Other Ambulatory Visit: Payer: Self-pay

## 2020-04-01 ENCOUNTER — Encounter: Payer: Self-pay | Admitting: Internal Medicine

## 2020-04-01 DIAGNOSIS — I5042 Chronic combined systolic (congestive) and diastolic (congestive) heart failure: Secondary | ICD-10-CM

## 2020-04-01 HISTORY — PX: LEFT HEART CATH AND CORONARY ANGIOGRAPHY: CATH118249

## 2020-04-01 LAB — LIPID PANEL
Cholesterol: 177 mg/dL (ref 0–200)
HDL: 44 mg/dL (ref >40)
LDL Cholesterol: 119 mg/dL — ABNORMAL HIGH (ref 0–99)
Total CHOL/HDL Ratio: 4 RATIO
Triglycerides: 70 mg/dL (ref <150)
VLDL: 14 mg/dL (ref 0–40)

## 2020-04-01 LAB — CBC
HCT: 32.1 % — ABNORMAL LOW (ref 36.0–46.0)
Hemoglobin: 9.9 g/dL — ABNORMAL LOW (ref 12.0–15.0)
MCH: 28.1 pg (ref 26.0–34.0)
MCHC: 30.8 g/dL (ref 30.0–36.0)
MCV: 91.2 fL (ref 80.0–100.0)
Platelets: 243 10*3/uL (ref 150–400)
RBC: 3.52 MIL/uL — ABNORMAL LOW (ref 3.87–5.11)
RDW: 17.5 % — ABNORMAL HIGH (ref 11.5–15.5)
WBC: 5.3 10*3/uL (ref 4.0–10.5)
nRBC: 0 % (ref 0.0–0.2)

## 2020-04-01 LAB — HEPARIN LEVEL (UNFRACTIONATED): Heparin Unfractionated: 0.1 IU/mL — ABNORMAL LOW (ref 0.30–0.70)

## 2020-04-01 LAB — BASIC METABOLIC PANEL
Anion gap: 9 (ref 5–15)
BUN: 11 mg/dL (ref 8–23)
CO2: 23 mmol/L (ref 22–32)
Calcium: 8.4 mg/dL — ABNORMAL LOW (ref 8.9–10.3)
Chloride: 103 mmol/L (ref 98–111)
Creatinine, Ser: 0.62 mg/dL (ref 0.44–1.00)
GFR calc Af Amer: >60 mL/min (ref >60)
GFR calc non Af Amer: >60 mL/min (ref >60)
Glucose, Bld: 112 mg/dL — ABNORMAL HIGH (ref 70–99)
Potassium: 4.1 mmol/L (ref 3.5–5.1)
Sodium: 135 mmol/L (ref 135–145)

## 2020-04-01 SURGERY — LEFT HEART CATH AND CORONARY ANGIOGRAPHY
Anesthesia: Moderate Sedation

## 2020-04-01 MED ORDER — ACETAMINOPHEN 325 MG PO TABS: 650.0000 mg | ORAL_TABLET | ORAL | Status: DC | PRN

## 2020-04-01 MED ORDER — HEPARIN SODIUM (PORCINE) 5000 UNIT/ML IJ SOLN
5000.0000 [IU] | Freq: Three times a day (TID) | INTRAMUSCULAR | Status: DC
  Administered 2020-04-01 – 2020-04-02 (×2): 5000 [IU] via SUBCUTANEOUS
  Filled 2020-04-01 (×2): qty 1

## 2020-04-01 MED ORDER — LABETALOL HCL 5 MG/ML IV SOLN: 10.0000 mg | INTRAVENOUS | Status: AC | PRN

## 2020-04-01 MED ORDER — ONDANSETRON HCL 4 MG/2ML IJ SOLN: 4.0000 mg | Freq: Four times a day (QID) | INTRAMUSCULAR | Status: DC | PRN

## 2020-04-01 MED ORDER — FENTANYL CITRATE (PF) 100 MCG/2ML IJ SOLN
INTRAMUSCULAR | Status: AC
  Filled 2020-04-01: qty 2

## 2020-04-01 MED ORDER — SODIUM CHLORIDE 0.9 % WEIGHT BASED INFUSION
1.0000 mL/kg/h | INTRAVENOUS | Status: DC
  Administered 2020-04-01: 1 mL/kg/h via INTRAVENOUS

## 2020-04-01 MED ORDER — SODIUM CHLORIDE 0.9 % IV SOLN: 250.0000 mL | INTRAVENOUS | Status: DC | PRN

## 2020-04-01 MED ORDER — HYDRALAZINE HCL 20 MG/ML IJ SOLN: 10.0000 mg | INTRAMUSCULAR | Status: AC | PRN

## 2020-04-01 MED ORDER — VERAPAMIL HCL 2.5 MG/ML IV SOLN
INTRAVENOUS | Status: DC | PRN
  Administered 2020-04-01: 2.5 mg via INTRAVENOUS

## 2020-04-01 MED ORDER — HEPARIN (PORCINE) IN NACL 1000-0.9 UT/500ML-% IV SOLN
INTRAVENOUS | Status: AC
  Filled 2020-04-01: qty 1000

## 2020-04-01 MED ORDER — VERAPAMIL HCL 2.5 MG/ML IV SOLN
INTRAVENOUS | Status: AC
  Filled 2020-04-01: qty 2

## 2020-04-01 MED ORDER — SODIUM CHLORIDE 0.9% FLUSH
3.0000 mL | Freq: Two times a day (BID) | INTRAVENOUS | Status: DC
  Administered 2020-04-01: 3 mL via INTRAVENOUS

## 2020-04-01 MED ORDER — HEPARIN (PORCINE) IN NACL 1000-0.9 UT/500ML-% IV SOLN
INTRAVENOUS | Status: DC | PRN
  Administered 2020-04-01: 500 mL

## 2020-04-01 MED ORDER — MIDAZOLAM HCL 2 MG/2ML IJ SOLN
INTRAMUSCULAR | Status: AC
  Filled 2020-04-01: qty 2

## 2020-04-01 MED ORDER — IOHEXOL 300 MG/ML  SOLN
INTRAMUSCULAR | Status: DC | PRN
  Administered 2020-04-01: 105 mL

## 2020-04-01 MED ORDER — MIDAZOLAM HCL 2 MG/2ML IJ SOLN
INTRAMUSCULAR | Status: DC | PRN
  Administered 2020-04-01: 1 mg via INTRAVENOUS

## 2020-04-01 MED ORDER — SODIUM CHLORIDE 0.9% FLUSH: 3.0000 mL | INTRAVENOUS | Status: DC | PRN

## 2020-04-01 MED ORDER — ASPIRIN 81 MG PO CHEW
81.0000 mg | CHEWABLE_TABLET | ORAL | Status: AC
  Administered 2020-04-01: 81 mg via ORAL

## 2020-04-01 MED ORDER — HEPARIN SODIUM (PORCINE) 1000 UNIT/ML IJ SOLN
INTRAMUSCULAR | Status: DC | PRN
  Administered 2020-04-01: 1500 [IU] via INTRAVENOUS

## 2020-04-01 MED ORDER — SODIUM CHLORIDE 0.9 % WEIGHT BASED INFUSION
3.0000 mL/kg/h | INTRAVENOUS | Status: AC
  Administered 2020-04-01: 3 mL/kg/h via INTRAVENOUS

## 2020-04-01 MED ORDER — ASPIRIN 81 MG PO CHEW
CHEWABLE_TABLET | ORAL | Status: AC
  Filled 2020-04-01: qty 1

## 2020-04-01 MED ORDER — HEPARIN SODIUM (PORCINE) 1000 UNIT/ML IJ SOLN
INTRAMUSCULAR | Status: AC
  Filled 2020-04-01: qty 1

## 2020-04-01 MED ORDER — HEPARIN BOLUS VIA INFUSION
1500.0000 [IU] | Freq: Once | INTRAVENOUS | Status: AC
  Administered 2020-04-01: 1500 [IU] via INTRAVENOUS
  Filled 2020-04-01: qty 1500

## 2020-04-01 MED ORDER — FENTANYL CITRATE (PF) 100 MCG/2ML IJ SOLN
INTRAMUSCULAR | Status: DC | PRN
  Administered 2020-04-01: 25 ug via INTRAVENOUS

## 2020-04-01 MED ORDER — SODIUM CHLORIDE 0.9 % WEIGHT BASED INFUSION: 1.0000 mL/kg/h | INTRAVENOUS | Status: AC

## 2020-04-01 SURGICAL SUPPLY — 7 items
CATH 5F 110X4 TIG (CATHETERS) ×3 IMPLANT
CATH INFINITI 5FR ANG PIGTAIL (CATHETERS) ×3 IMPLANT
DEVICE RAD TR BAND REGULAR (VASCULAR PRODUCTS) ×3 IMPLANT
GLIDESHEATH SLEND SS 6F .021 (SHEATH) ×3 IMPLANT
KIT MANI 3VAL PERCEP (MISCELLANEOUS) ×3 IMPLANT
PACK CARDIAC CATH (CUSTOM PROCEDURE TRAY) ×3 IMPLANT
WIRE ROSEN-J .035X260CM (WIRE) ×3 IMPLANT

## 2020-04-01 NOTE — Progress Notes (Signed)
PROGRESS NOTE    Tina Salinas   CBU:[id number]  DOB: September 30, 1945  DOA: 03/31/2020     1  PCP: Birdie Sons, MD  CC: [facility] Course: Tina Salinas is a 74  yo female with PMH CAD s/p PCI, HFpEF, PVD, RA (on plaquenil), CKDIII, PAF, mostly bed bound status at home who presented to the hospital with substernal/left-sided chest pain that has been worsening over approximately 2 days prior to admission.  Pain was described as sharp in nature.  She had no associated nausea/vomiting or diaphoresis or palpitations.  She called 911 and was brought to the hospital. She underwent work-up for ischemia with EKG and trending of troponins.  EKG was overall unremarkable however troponins did become elevated and cardiology was consulted and a heparin drip was started.  She was ultimately taken for heart catheterization on 04/01/2020.  She was found to have 50% stenosis in the proximal to mid LAD.  Remaining vessels had mild to moderate stenosis, no more than 40%.  She did not require any stenting or intervention.  She had patent stents noted in the mid left circumflex and proximal and mid RCA. She was found to have severe dilated cardiomyopathy with inferior apical akinesis and anteroapical hypokinesis with LVEF estimated at 20 to 25%.  She was recommended to continue on adequate management and ongoing risk factor modification. She was started on Imdur for her ongoing angina.  Her dose of carvedilol was also decreased to allow for blood pressure room with addition of Imdur. She had no desire to increase her physical mobility at home and appears to remain bedbound.  She states she has care at home who brings her food/groceries.  She has skin breakdown/wounds on her back from being mostly bedbound as well.  She is also asking for a pure wick catheter so that she does not have to get out of bed.   Interval History:  Seen in room after heart catheterization today.  Her chest pain was improved some.  She did not  have significant stenosis to warrant stenting today and previous stents were seen to be patent.  She was started on Imdur and recommended for ongoing medical management and risk factor modification. She was slightly irritable upon my interview with her in the room wanting help with her lunch tray and was having difficulty reaching staff.  Old records reviewed in assessment of this patient  ROS: Constitutional: negative for chills and fevers, Respiratory: negative for cough, Cardiovascular: positive for chest pain and Gastrointestinal: negative for abdominal pain  Assessment & Plan:  CAD with stable angina -Heart catheterization negative for significant lesion.  First was LAD with 50% stenosis.  Previous stents were patent -Continue Imdur, Coreg -Treat further chest pain with Nitropaste or sublingual nitro -Continue amiodarone -Continue pravastatin -Heparin drip discontinued -Appreciate cardiology evaluation  History of chronic combined systolic and diastolic dysfunction CHF Not in acute exacerbation Last known LVEF of 20 to 25%.  Heart cath today reveals similar EF Continue carvedilol and  Furosemide PRN  Patient not on an ACE/ARB due to renal insufficiency  Hypokalemia -Replete and recheck as needed  Stage III chronic kidney disease Stable  Monitor closely during this hospitalization   Moderate protein calorie malnutrition (BMI 15.62 kg/m2) Maintain low-sodium diet Administration of nutrition supplements Continue Megace    Paroxysmal atrial fibrillation Continue amiodarone and carvedilol for rate control Patient not on long-term anticoagulation therapy due to increased risk for fall   History of rheumatoid arthritis Continue  Plaquenil Continue weekly methotrexate upon discharge   Antimicrobials: None  DVT prophylaxis: HSQ Code Status: Full Family Communication: none present Disposition Plan: Status is: Inpatient  Remains inpatient appropriate  because:Unsafe d/c plan and Inpatient level of care appropriate due to severity of illness   Dispo: The patient is from: Home              Anticipated d/c is to: Home Patient refuses to go anywhere else              Anticipated d/c date is: 1 day              Patient currently is not medically stable to d/c.       Objective: Blood pressure 105/63, pulse 74, temperature 97.6 F (36.4 C), temperature source Oral, resp. rate 17, height 5' (1.524 m), weight 43.7 kg, SpO2 99 %.  Examination: General appearance: Chronically ill-appearing elderly woman laying in bed in no distress Head: Normocephalic, without obvious abnormality, atraumatic Eyes: EOMI Lungs: clear to auscultation bilaterally Heart: irregularly irregular rhythm and S1, S2 normal Abdomen: normal findings: bowel sounds normal and soft, non-tender Extremities: Bilateral lower extremity pitting edema with cool to touch feet.  Unable to palpate distal pulses Skin: Cool lower extremities.  Dry and intact Neurologic: Moves all 4 extremities  Consultants:   Cardiology  Procedures:   04/01/2020, left heart cath  Data Reviewed: I have personally reviewed following labs and imaging studies Results for orders placed or performed during the hospital encounter of 03/31/20 (from the past 24 hour(s))  Heparin level (unfractionated)     Status: Abnormal   Collection Time: 04/01/20 12:00 AM  Result Value Ref Range   Heparin Unfractionated 0.10 (L) 0.30 - 0.70 IU/mL  Basic metabolic panel     Status: Abnormal   Collection Time: 04/01/20  5:32 AM  Result Value Ref Range   Sodium 135 135 - 145 mmol/L   Potassium 4.1 3.5 - 5.1 mmol/L   Chloride 103 98 - 111 mmol/L   CO2 23 22 - 32 mmol/L   Glucose, Bld 112 (H) 70 - 99 mg/dL   BUN 11 8 - 23 mg/dL   Creatinine, Ser 0.62 0.44 - 1.00 mg/dL   Calcium 8.4 (L) 8.9 - 10.3 mg/dL   GFR calc non Af Amer >60 >60 mL/min   GFR calc Af Amer >60 >60 mL/min   Anion gap 9 5 - 15  Lipid panel      Status: Abnormal   Collection Time: 04/01/20  5:32 AM  Result Value Ref Range   Cholesterol 177 0 - 200 mg/dL   Triglycerides 70 <150 mg/dL   HDL 44 >40 mg/dL   Total CHOL/HDL Ratio 4.0 RATIO   VLDL 14 0 - 40 mg/dL   LDL Cholesterol 119 (H) 0 - 99 mg/dL  CBC     Status: Abnormal   Collection Time: 04/01/20  5:32 AM  Result Value Ref Range   WBC 5.3 4.0 - 10.5 K/uL   RBC 3.52 (L) 3.87 - 5.11 MIL/uL   Hemoglobin 9.9 (L) 12.0 - 15.0 g/dL   HCT 32.1 (L) 36 - 46 %   MCV 91.2 80.0 - 100.0 fL   MCH 28.1 26.0 - 34.0 pg   MCHC 30.8 30.0 - 36.0 g/dL   RDW 17.5 (H) 11.5 - 15.5 %   Platelets 243 150 - 400 K/uL   nRBC 0.0 0.0 - 0.2 %    Recent Results (from the past 240 hour(s))  SARS Coronavirus 2 by RT PCR (hospital order, performed in [facility] hospital lab) Nasopharyngeal Nasopharyngeal Swab     Status: None   Collection Time: 03/31/20  4:53 AM   Specimen: Nasopharyngeal Swab  Result Value Ref Range Status   SARS Coronavirus 2 NEGATIVE NEGATIVE Final    Comment: (NOTE) SARS-CoV-2 target nucleic acids are NOT DETECTED.  The SARS-CoV-2 RNA is generally detectable in upper and lower respiratory specimens during the acute phase of infection. The lowest concentration of SARS-CoV-2 viral copies this assay can detect is 250 copies / mL. A negative result does not preclude SARS-CoV-2 infection and should not be used as the sole basis for treatment or other patient management decisions.  A negative result may occur with improper specimen collection / handling, submission of specimen other than nasopharyngeal swab, presence of viral mutation(s) within the areas targeted by this assay, and inadequate number of viral copies (<250 copies / mL). A negative result must be combined with clinical observations, patient history, and epidemiological information.  Fact Sheet for Patients:   StrictlyIdeas.no  Fact Sheet for Healthcare  Providers: BankingDealers.co.za  This test is not yet approved or  cleared by the [place] FDA and has been authorized for detection and/or diagnosis of SARS-CoV-2 by FDA under an Emergency Use Authorization (EUA).  This EUA will remain in effect (meaning this test can be used) for the duration of the COVID-19 declaration under Section 564(b)(1) of the Act, 21 U.S.C. section 360bbb-3(b)(1), unless the authorization is terminated or revoked sooner.  Performed at [facility], [address] [place], [place] [address]      Radiology Studies: DG Chest 1 View  Result Date: 03/31/2020 CLINICAL DATA:  Left-sided chest pain EXAM: CHEST  1 VIEW COMPARISON:  11/28/2019, CT 10/02/2019 FINDINGS: Bilateral breast implants. Small left-sided pleural effusion with basilar consolidation. Right lung grossly clear. Borderline to mild cardiomegaly with aortic atherosclerosis. No pneumothorax. IMPRESSION: Small left-sided pleural effusion with left basilar consolidation, either reflecting atelectasis or pneumonia. Electronically Signed   By: Donavan Foil M.D.   On: 03/31/2020 01:47   CT Angio Chest PE W and/or Wo Contrast  Result Date: 03/31/2020 CLINICAL DATA:  Left-sided chest pain for 2 days EXAM: CT ANGIOGRAPHY CHEST WITH CONTRAST TECHNIQUE: Multidetector CT imaging of the chest was performed using the standard protocol during bolus administration of intravenous contrast. Multiplanar CT image reconstructions and MIPs were obtained to evaluate the vascular anatomy. CONTRAST:  88mL OMNIPAQUE IOHEXOL 350 MG/ML SOLN COMPARISON:  10/02/2019, chest x-ray from earlier in the same day. FINDINGS: Cardiovascular: Atherosclerotic calcifications of the aorta are again identified. No aneurysmal dilatation or dissection is noted. Mild pericardial effusion is noted stable from the prior exam. Mild cardiac enlargement is seen. Coronary calcifications are noted. Pulmonary artery is well  visualized within normal branching pattern. No filling defect to suggest pulmonary embolism is seen. Mediastinum/Nodes: Thoracic inlet is within normal limits. No sizable hilar or mediastinal adenopathy is noted. The esophagus as visualized is within normal limits. Lungs/Pleura: Right lung is well aerated. Some very minimal right lower lobe infiltrate is seen. Mild left lower lobe atelectasis/infiltrate is noted with associated effusion. The overall appearance is similar to that seen on the prior exam. The remainder of the left lung is well aerated. Upper Abdomen: Visualized upper abdomen is unremarkable. Musculoskeletal: Bilateral breast implants are noted. Degenerative changes of the thoracic spine are seen. Review of the MIP images confirms the above findings. IMPRESSION: No evidence of pulmonary emboli. Recurrent left  lower lobe atelectasis and associated effusion. Patchy right lower lobe opacity which may represent scarring from prior pneumonia. Aortic Atherosclerosis (ICD10-I70.0). Electronically Signed   By: Inez Catalina M.D.   On: 03/31/2020 06:01   CARDIAC CATHETERIZATION  Result Date: 04/01/2020  Prox Cx to Mid Cx lesion is 10% stenosed.  Ost LAD to Prox LAD lesion is 30% stenosed.  Prox LAD to Mid LAD lesion is 50% stenosed.  1st Diag lesion is 40% stenosed.  Prox RCA lesion is 10% stenosed.  Ost RCA to Prox RCA lesion is 30% stenosed.  1.  Nonobstructive coronary artery disease with 50% stenosis mid LAD, patent stent mid left circumflex, patent stents proximal and mid RCA,overall appears unchanged compared to previous cardiac catheterization from 05/16/2017 2.  Severe dilated cardiomyopathy, with inferior apical akinesis and anteroapical hypokinesis, estimated LV ejection fraction 20 to 25% Recommendations 1.  Continue medical therapy 2.  Continue aggressive risk factor modification 3.  Possible discharge home later today   ECHOCARDIOGRAM COMPLETE  Result Date: 03/31/2020    ECHOCARDIOGRAM  REPORT   Patient Name:   Tina Salinas Date of Exam: 03/31/2020 Medical Rec #:  [id number]    Height:       60.0 in Accession #:    [id number]   Weight:       80.0 lb Date of Birth:  1946-01-28    BSA:          1.265 m Patient Age:    45 years     BP:           113/70 mmHg Patient Gender: F            HR:           69 bpm. Exam Location:  [facility] Procedure: 2D Echo, Color Doppler and Cardiac Doppler Indications:     I21.4 NSTEMI  History:         Patient has prior history of Echocardiogram examinations, most                  recent 10/02/2019. CHF; Risk Factors:Hypertension.  Sonographer:     Charmayne Sheer RDCS (AE) Referring Phys:  [id number] Clabe Seal Diagnosing Phys: Isaias Cowman MD  Sonographer Comments: Image acquisition challenging due to breast implants. IMPRESSIONS  1. Left ventricular ejection fraction, by estimation, is <20%. The left ventricle has severely decreased function. The left ventricle demonstrates regional wall motion abnormalities (see scoring diagram/findings for description). The left ventricular internal cavity size was moderately to severely dilated. Left ventricular diastolic parameters were normal.  2. Right ventricular systolic function is normal. The right ventricular size is normal.  3. A small pericardial effusion is present.  4. The mitral valve is normal in structure. Mild to moderate mitral valve regurgitation. No evidence of mitral stenosis.  5. Tricuspid valve regurgitation is mild to moderate.  6. The aortic valve is normal in structure. Aortic valve regurgitation is not visualized. No aortic stenosis is present.  7. The inferior vena cava is normal in size with greater than 50% respiratory variability, suggesting right atrial pressure of 3 mmHg. FINDINGS  Left Ventricle: Left ventricular ejection fraction, by estimation, is <20%. The left ventricle has severely decreased function. The left ventricle demonstrates regional wall motion abnormalities. The left ventricular internal  cavity size was moderately to severely dilated. There is no left ventricular hypertrophy. Left ventricular diastolic parameters were normal.  LV Wall Scoring: The antero-lateral wall and basal inferolateral segment are akinetic. The anterior septum, mid  inferoseptal segment, and basal inferoseptal segment are hypokinetic. Right Ventricle: The right ventricular size is normal. No increase in right ventricular wall thickness. Right ventricular systolic function is normal. Left Atrium: Left atrial size was normal in size. Right Atrium: Right atrial size was normal in size. Pericardium: A small pericardial effusion is present. Mitral Valve: The mitral valve is normal in structure. Mild to moderate mitral valve regurgitation. No evidence of mitral valve stenosis. MV peak gradient, 4.4 mmHg. The mean mitral valve gradient is 1.0 mmHg. Tricuspid Valve: The tricuspid valve is normal in structure. Tricuspid valve regurgitation is mild to moderate. No evidence of tricuspid stenosis. Aortic Valve: The aortic valve is normal in structure. Aortic valve regurgitation is not visualized. No aortic stenosis is present. Aortic valve mean gradient measures 4.0 mmHg. Aortic valve peak gradient measures 7.7 mmHg. Aortic valve area, by VTI measures 0.66 cm. Pulmonic Valve: The pulmonic valve was normal in structure. Pulmonic valve regurgitation is not visualized. No evidence of pulmonic stenosis. Aorta: The aortic root is normal in size and structure. Venous: The inferior vena cava is normal in size with greater than 50% respiratory variability, suggesting right atrial pressure of 3 mmHg. IAS/Shunts: No atrial level shunt detected by color flow Doppler.  LEFT VENTRICLE PLAX 2D LVIDd:         5.47 cm  Diastology LVIDs:         5.10 cm  LV e' medial:    3.05 cm/s LV PW:         0.89 cm  LV E/e' medial:  30.1 LV IVS:        0.72 cm  LV e' lateral:   3.48 cm/s LVOT diam:     1.50 cm  LV E/e' lateral: 26.4 LV SV:         17 LV SV Index:   14  LVOT Area:     1.77 cm  RIGHT VENTRICLE RV Basal diam:  2.67 cm LEFT ATRIUM             Index       RIGHT ATRIUM          Index LA diam:        3.30 cm 2.61 cm/m  RA Area:     8.92 cm LA Vol (A2C):   45.2 ml 35.74 ml/m RA Volume:   16.10 ml 12.73 ml/m LA Vol (A4C):   49.9 ml 39.46 ml/m LA Biplane Vol: 49.9 ml 39.46 ml/m  AORTIC VALVE                   PULMONIC VALVE AV Area (Vmax):    0.77 cm    PV Vmax:       1.09 m/s AV Area (Vmean):   0.81 cm    PV Vmean:      69.500 cm/s AV Area (VTI):     0.66 cm    PV VTI:        0.183 m AV Vmax:           139.00 cm/s PV Peak grad:  4.8 mmHg AV Vmean:          87.600 cm/s PV Mean grad:  2.0 mmHg AV VTI:            0.261 m AV Peak Grad:      7.7 mmHg AV Mean Grad:      4.0 mmHg LVOT Vmax:         60.70 cm/s LVOT Vmean:  40.100 cm/s LVOT VTI:          0.098 m LVOT/AV VTI ratio: 0.37  AORTA Ao Root diam: 1.60 cm MITRAL VALVE MV Area (PHT): 3.26 cm    SHUNTS MV Peak grad:  4.4 mmHg    Systemic VTI:  0.10 m MV Mean grad:  1.0 mmHg    Systemic Diam: 1.50 cm MV Vmax:       1.05 m/s MV Vmean:      53.7 cm/s MV Decel Time: 233 msec MV E velocity: 91.70 cm/s MV A velocity: 39.00 cm/s MV E/A ratio:  2.35 Isaias Cowman MD Electronically signed by Isaias Cowman MD Signature Date/Time: 03/31/2020/1:17:28 PM    Final    CT Angio Chest PE W and/or Wo Contrast  Final Result    DG Chest 1 View  Final Result      Scheduled Meds: . amiodarone  200 mg Oral Daily  . aspirin EC  81 mg Oral Daily  . calcium-vitamin D  2 tablet Oral Q breakfast  . carvedilol  3.125 mg Oral BID  . cholecalciferol  1,000 Units Oral Daily  . docusate sodium  100 mg Oral Daily  . feeding supplement (ENSURE ENLIVE)  237 mL Oral BID BM  . ferrous [id number] C-folic acid  1 capsule Oral BID PC  . hydroxychloroquine  400 mg Oral Daily  . isosorbide mononitrate  30 mg Oral Daily  . megestrol  400 mg Oral Daily  . mirtazapine  30 mg Oral QHS  . pravastatin  40 mg Oral  QHS  . predniSONE  2.5 mg Oral Q breakfast  . sodium chloride flush  3 mL Intravenous Q12H  . sodium chloride flush  3 mL Intravenous Q12H  . sodium chloride flush  3 mL Intravenous Q12H   PRN Meds: sodium chloride, sodium chloride, acetaminophen, acetaminophen, furosemide, HYDROcodone-acetaminophen, morphine injection, nitroGLYCERIN, ondansetron (ZOFRAN) IV, ondansetron (ZOFRAN) IV, oxybutynin, sodium chloride flush, sodium chloride flush Continuous Infusions: . sodium chloride    . sodium chloride    . sodium chloride        LOS: 1 day  Time spent: Greater than 50% of the 35 minute visit was spent in counseling/coordination of care for the patient as laid out in the A&P.   Dwyane Dee, MD [facility] 04/01/2020, 7:22 PM  Contact via secure chat.  To contact the attending provider between 7A-7P or the covering provider during after hours 7P-7A, please log into the web site [email or web address] and access using universal  password for that web site. If you do not have the password, please call the hospital operator.

## 2020-04-01 NOTE — Chronic Care Management (AMB) (Signed)
°  Chronic Care Management   Outreach Note  04/01/2020 Name: SUHAYLA CHISOM MRN: [id number] DOB: [birth year]  Primary Care Provider: Birdie Sons, MD Reason for referral : Chronic Care Management    Ms. Harl was referred to the case management team for assistance with care management and care coordination.  Per chart review, Ms Kreiger is currently hospitalized. The care management team will follow-up after discharge.    PLAN Follow-up pending discharge disposition.    C[facility] [phone]

## 2020-04-01 NOTE — Hospital Course (Addendum)
Tina Salinas is a 74  yo female with PMH CAD s/p PCI, HFpEF, PVD, RA (on plaquenil), CKDIII, PAF, mostly bed bound status at home who presented to the hospital with substernal/left-sided chest pain that has been worsening over approximately 2 days prior to admission.  Pain was described as sharp in nature.  She had no associated nausea/vomiting or diaphoresis or palpitations.  She called 911 and was brought to the hospital. She underwent work-up for ischemia with EKG and trending of troponins.  EKG was overall unremarkable however troponins did become elevated and cardiology was consulted and a heparin drip was started.  She was ultimately taken for heart catheterization on 04/01/2020.  She was found to have 50% stenosis in the proximal to mid LAD.  Remaining vessels had mild to moderate stenosis, no more than 40%.  She did not require any stenting or intervention.  She had patent stents noted in the mid left circumflex and proximal and mid RCA. She was found to have severe dilated cardiomyopathy with inferior apical akinesis and anteroapical hypokinesis with LVEF estimated at 20 to 25%.  She was recommended to continue on adequate management and ongoing risk factor modification. She was started on Imdur for her ongoing angina.  Her dose of carvedilol was continued but patient was told this may need to be lowered to allow for blood pressure room with addition of Imdur. We discussed her med changes bedside and she was able to explain back the med changes and the reasons why (she will need BP evaluation at follow up to assess tolerance of imdur as she says she does not check her BP at home, and still explained the reasons and risk to me, but still does not want to check). Her BP did remain stable with monitoring in the hospital while on the regimen she will be on at home at discharge.   She had no desire to increase her physical mobility at home and appears to remain bedbound.  She states she has care at home who  brings her food/groceries [name] comes in the mornings and [name] comes in the afternoons).  She has skin breakdown/wounds on her back from being mostly bedbound as well.  She is also asking for a pure wick catheter so that she does not have to get out of bed. I have evaluated her from a capacity standpoint and she very clearly has capacity for making decisions regarding going home from the hospital after this hospitalization.  She very clearly explains that she has [name] and [name] who come check on her daily.  She states that she knows that home health will not come back out.  She does not feel that her house is uninhabitable but she does validate our concern about her going home.  Regardless, she very clearly can state that the worst case scenario would be her death if she had no help at home and was stuck in bed; however, she quickly states that she would call 911 if needing to go to appointments and could not get a ride, but again falls back on her statements of talking about having "plenty of friends to come help". I do not believe that she lacks capacity.  Therefore, as the medical work-up has been completed and she is medically stabilized and she is also wishing to go home, discharge orders will be placed.  Transportation will be arranged for taking patient home.

## 2020-04-01 NOTE — ED Notes (Signed)
Pt repositioned and given another mouth swab

## 2020-04-01 NOTE — Progress Notes (Signed)
[place] for Heparin Indication: ACS/STEMI  Allergies  Allergen Reactions  . Etodolac     Other reaction(s): Unknown  . Sulfa Antibiotics   . Tetracycline     Patient Measurements: Height: 5' (152.4 cm) Weight: 36.3 kg (80 lb) IBW/kg (Calculated) : 45.5 HEPARIN DW (KG): 36.3  Vital Signs: BP: 122/75 (09/16 0052) Pulse Rate: 81 (09/16 0052)  Labs: Recent Labs    03/31/20 0021 03/31/20 0021 03/31/20 0308 03/31/20 0501 03/31/20 1052 03/31/20 1356 04/01/20 0000  HGB 10.6*  --   --   --   --   --   --   HCT 34.5*  --   --   --   --   --   --   PLT 222  --   --   --   --   --   --   APTT  --   --   --  25  --   --   --   LABPROT  --   --   --  14.6  --   --   --   INR  --   --   --  1.2  --   --   --   HEPARINUNFRC  --   --   --   --   --  <0.10* 0.10*  CREATININE 0.71  --   --   --  0.79  --   --   TROPONINIHS 74*   < > 201*  --  296* 342*  --    < > = values in this interval not displayed.    Estimated Creatinine Clearance: 36.4 mL/min (by C-G formula based on SCr of 0.79 mg/dL).   Medical History: Past Medical History:  Diagnosis Date  . Adenoma   . CAP (community acquired pneumonia) 11/16/2017  . Cerebral palsy ([place])   . CHF (congestive heart failure) ([place])   . Femur fracture ([place])   . History of blood transfusion   . Hypertension   . Osteoarthritis   . Osteoporosis   . Rheumatoid arthritis (HCC)     Medications:  (Not in a hospital admission)   Assessment: No anticoagulants PTA.  Heparin for ACS.  Baseline labs ordered.  Heparin bolus 2100 units x 1 then infusion at 450 units/hr  Goal of Therapy:  Heparin level 0.3-0.7 units/ml Monitor platelets by anticoagulation protocol: Yes   Plan:  9/16 @ 0000 HL 0.10. subtherapeutic. Will order Heparin bolus 1500 units x 1 then increase infusion to 700 units/hr recheck HL in 8 hours.  [place] Crane, Hannia Matchett A 04/01/2020,1:25 AM

## 2020-04-01 NOTE — Telephone Encounter (Signed)
Patient is calling to request an order for a PureWick -Female External Catheter. Patient is calling to state that she has 5 bed sores. Patient is request if Dr. Caryn Section declines please call back - [phone]  Patient is also requesting a medication to help heal her bed sores. Please advise

## 2020-04-01 NOTE — Progress Notes (Addendum)
Initial Nutrition Assessment  DOCUMENTATION CODES:   Severe malnutrition in context of chronic illness  INTERVENTION:   Ensure Enlive po BID, each supplement provides 350 kcal and 20 grams of protein  Magic cup TID with meals, each supplement provides 290 kcal and 9 grams of protein  MVI daily  Recommend liberal diet once advanced   NUTRITION DIAGNOSIS:   Severe Malnutrition related to chronic illness (cerebral palsy, CHF, RA) as evidenced by severe fat depletion, severe muscle depletion.  GOAL:   Patient will meet greater than or equal to 90% of their needs  MONITOR:   PO intake, Supplement acceptance, Labs, Weight trends, Skin, I & O's  REASON FOR ASSESSMENT:   Malnutrition Screening Tool    ASSESSMENT:   74 y.o. female with medical history significant for coronary artery disease status post stent angioplasty, history of CHF with preserved LVEF, peripheral vascular disease, rheumatoid arthritis, stage III chronic kidney disease and history of atrial fibrillation not on chronic anticoagulation therapy who is admitted with NSTEMI   Unable to see pt today as pt in cardiac cath lab at time of RD visit. Pt is well known to this RD from multiple previous admits. Pt with poor appetite and oral intake at baseline. Pt has reported in the past that she drinks chocolate and vanilla Ensure supplements at home. Pt also enjoys orange Magic Cups. RD will add supplements and MVI to help pt meet her estimated needs. Recommend liberal diet once advanced. RD will obtain nutrition related history and exam at follow-up. Per chart, pt with 12lb(13%) weight loss over the past 8 months; this is severe weight loss. Pt with severe chronic malnutrition.   Medications reviewed and include: aspirin, oscal w/ D, D3, colace, ferrous fumerate, megace, remeron, prednisone  Labs reviewed: Hgb 9.9(L), Hct 32.1(L)  Diet Order:   Diet Order            Diet NPO time specified Except for: Sips with Meds   Diet effective midnight           Diet 2 gram sodium Room service appropriate? Yes; Fluid consistency: Thin  Diet effective now                EDUCATION NEEDS:   No education needs have been identified at this time  Skin:   not assessed  Last BM:  pta  Height:   Ht Readings from Last 1 Encounters:  04/01/20 5' (1.524 m)    Weight:   Wt Readings from Last 1 Encounters:  04/01/20 36.3 kg    Ideal Body Weight:  45.4 kg  BMI:  Body mass index is 15.63 kg/m.  Estimated Nutritional Needs:   Kcal:  1300-1500kcal/day  Protein:  65-75g/day  Fluid:  1-1.1L/day  Koleen Distance MS, RD, LDN Please refer to [facility] for RD and/or RD on-call/weekend/after hours pager

## 2020-04-01 NOTE — ED Notes (Signed)
Pt called ready for RN to return.  Pt undressed. Consent signed for cath lab.

## 2020-04-01 NOTE — ED Notes (Signed)
Attempting to undress pt for cath lab. Pt requesting 5 min to urinate. Has purewick on.  Took BP cuff off but pt would like time prior to anything being unhooked.  RN will return.

## 2020-04-01 NOTE — ED Notes (Signed)
Transported to cath lab by cathy RN from cath lab.

## 2020-04-01 NOTE — ED Notes (Signed)
PT cleansed of incontinent stool, barrier cream and sacral pad applied. New brief and new purewick applied. PT adjusted to other side of body.

## 2020-04-01 NOTE — Telephone Encounter (Signed)
Pt called back stating she really needs Dr Caryn Section to order this [place] female external Catheter.  Pt telling me she is in the hospital right now and doesn't know when she will be discharged. Cell [phone]

## 2020-04-02 ENCOUNTER — Encounter: Payer: Self-pay | Admitting: Cardiology

## 2020-04-02 DIAGNOSIS — I25118 Atherosclerotic heart disease of native coronary artery with other forms of angina pectoris: Secondary | ICD-10-CM

## 2020-04-02 LAB — BASIC METABOLIC PANEL
Anion gap: 10 (ref 5–15)
BUN: 11 mg/dL (ref 8–23)
CO2: 26 mmol/L (ref 22–32)
Calcium: 8.6 mg/dL — ABNORMAL LOW (ref 8.9–10.3)
Chloride: 104 mmol/L (ref 98–111)
Creatinine, Ser: 0.76 mg/dL (ref 0.44–1.00)
GFR calc Af Amer: >60 mL/min (ref >60)
GFR calc non Af Amer: >60 mL/min (ref >60)
Glucose, Bld: 106 mg/dL — ABNORMAL HIGH (ref 70–99)
Potassium: 3.9 mmol/L (ref 3.5–5.1)
Sodium: 140 mmol/L (ref 135–145)

## 2020-04-02 LAB — CBC WITH DIFFERENTIAL/PLATELET
Abs Immature Granulocytes: 0.03 10*3/uL (ref 0.00–0.07)
Basophils Absolute: 0 10*3/uL (ref 0.0–0.1)
Basophils Relative: 0 %
Eosinophils Absolute: 0.1 10*3/uL (ref 0.0–0.5)
Eosinophils Relative: 1 %
HCT: 33 % — ABNORMAL LOW (ref 36.0–46.0)
Hemoglobin: 10.2 g/dL — ABNORMAL LOW (ref 12.0–15.0)
Immature Granulocytes: 1 %
Lymphocytes Relative: 23 %
Lymphs Abs: 1.1 10*3/uL (ref 0.7–4.0)
MCH: 27.9 pg (ref 26.0–34.0)
MCHC: 30.9 g/dL (ref 30.0–36.0)
MCV: 90.4 fL (ref 80.0–100.0)
Monocytes Absolute: 0 10*3/uL — ABNORMAL LOW (ref 0.1–1.0)
Monocytes Relative: 1 %
Neutro Abs: 3.3 10*3/uL (ref 1.7–7.7)
Neutrophils Relative %: 74 %
Platelets: 299 10*3/uL (ref 150–400)
RBC: 3.65 MIL/uL — ABNORMAL LOW (ref 3.87–5.11)
RDW: 17.7 % — ABNORMAL HIGH (ref 11.5–15.5)
WBC: 4.5 10*3/uL (ref 4.0–10.5)
nRBC: 0 % (ref 0.0–0.2)

## 2020-04-02 LAB — MAGNESIUM: Magnesium: 2.4 mg/dL (ref 1.7–2.4)

## 2020-04-02 MED ORDER — ZINC OXIDE 40 % EX OINT
TOPICAL_OINTMENT | Freq: Two times a day (BID) | CUTANEOUS | Status: DC
  Filled 2020-04-02: qty 113

## 2020-04-02 MED ORDER — ISOSORBIDE MONONITRATE ER 30 MG PO TB24: 30.0000 mg | ORAL_TABLET | Freq: Every day | ORAL | 3 refills | Status: AC

## 2020-04-02 NOTE — TOC Transition Note (Addendum)
Transition of Care [facility]) - CM/SW Discharge Note   Patient Details  Name: Tina Salinas MRN: [id number] Date of Birth: [birth year]  [facility]) CM/SW Contact:  Victorino Dike, RN Phone Number: 04/02/2020, 3:27 PM   Clinical Narrative:     Patient to discharge home today via [facility], [place] was unable to be set up, Reasoning from [place] is patient needs 24 hours supervision in home for it to be safe,  [facility] discontinued services, [facility] and [facility].   APS referral made for patient living in this condition.   Final next level of care: Home/Self Care Barriers to Discharge: Barriers Resolved   Patient Goals and CMS Choice   CMS Medicare.gov Compare Post Acute Care list provided to:: Patient Choice offered to / list presented to : Patient  Discharge Placement                       Discharge Plan and Services In-house Referral: Clinical Social Work Discharge Planning Services: CM Consult Post Acute Care Choice: Home Health                               Social Determinants of Health (SDOH) Interventions     Readmission Risk Interventions No flowsheet data found.

## 2020-04-02 NOTE — Consult Note (Signed)
[place] Nurse Consult Note: Reason for Consult: Primary team requested assistance with ordering an air mattress for the patient.  Order entered into the EMR for secretary to request an air mattress from the supply chain. Please re-consult if further assistance is needed.  Thank-you,  Julien Girt MSN, [place], [place], [place], [place]

## 2020-04-02 NOTE — Progress Notes (Signed)
Patient resting in bed. Agitated and irritable since the beginning of my shift. I notified SW and Wound RN reference patient's request for air mattress and wound on sacrum. Medications ordered for sacral woumd with instructions. SW checking on air mattress for patient. Patient refuses assessment this morning; states she is not letting anyone else here bother her. I was able to admin morning medications to her after I ordered her breakfast and helped set her up to eat. MD is aware of patient's desire to go home today. MD Paraschos has signed off and stated patient is stable for discharge in his notes. I will continue to assess her needs here and what she will need at home to the extent she will allow me.

## 2020-04-02 NOTE — Progress Notes (Signed)
Patient made aware by CM that she will be discharged home today. Patient understands she is being discharged and acknowledged associated risks.
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
## 2020-04-02 NOTE — Care Management Important Message (Signed)
Important Message  Patient Details  Name: CATELIN MANTHE MRN: [id number] Date of Birth: 1945/12/07   Medicare Important Message Given:  Yes     [name] 04/02/2020, 2:13 PM

## 2020-04-02 NOTE — Progress Notes (Addendum)
Went into room with Tina Salinas. CM tried to get confirmation about patient's support system at home. Patient denied her any information other than saying she has a friend at home who can come over. Patient requested we leaver her "home" and not return. Upon prompting, patient was able to tell us she is at the hospital. Will continue to assess.

## 2020-04-02 NOTE — Progress Notes (Signed)
[facility]  SUBJECTIVE: Patient laying in bed, still complains of mild left-sided chest pain, much improved and less severe to this pain on day of admission   Vitals:   04/01/20 1931 04/01/20 2307 04/02/20 0613 04/02/20 0716  BP: 107/65 120/87 106/60 107/62  Pulse: 79  66 66  Resp:  (!) 21 16 16   Temp:  98.1 F (36.7 C) 97.8 F (36.6 C) 97.7 F (36.5 C)  TempSrc:  Oral Oral Oral  SpO2:  97% 99% 99%  Weight:      Height:         Intake/Output Summary (Last 24 hours) at 04/02/2020 0830 Last data filed at 04/02/2020 7353 Gross per 24 hour  Intake 480 ml  Output 1700 ml  Net -1220 ml      PHYSICAL EXAM  General: Well developed, well nourished, in no acute distress HEENT:  Normocephalic and atramatic Neck:  No JVD.  Lungs: Clear bilaterally to auscultation and percussion. Heart: HRRR . Normal S1 and S2 without gallops or murmurs.  Abdomen: Bowel sounds are positive, abdomen soft and non-tender  Msk:  Back normal, normal gait. Normal strength and tone for age. Extremities: No clubbing, cyanosis or edema.   Neuro: Alert and oriented X 3. Psych:  Good affect, responds appropriately   LABS: Basic Metabolic Panel: Recent Labs    03/31/20 0453 03/31/20 1052 04/01/20 0532 04/02/20 0651  NA  --    < > 135 140  K  --    < > 4.1 3.9  CL  --    < > 103 104  CO2  --    < > 23 26  GLUCOSE  --    < > 112* 106*  BUN  --    < > 11 11  CREATININE  --    < > 0.62 0.76  CALCIUM  --    < > 8.4* 8.6*  MG 2.0  --   --  2.4   < > = values in this interval not displayed.   Liver Function Tests: No results for input(s): AST, ALT, ALKPHOS, BILITOT, PROT, ALBUMIN in the last 72 hours. No results for input(s): LIPASE, AMYLASE in the last 72 hours. CBC: Recent Labs    04/01/20 0532 04/02/20 0651  WBC 5.3 4.5  NEUTROABS  --  3.3  HGB 9.9* 10.2*  HCT 32.1* 33.0*  MCV 91.2 90.4  PLT 243 299   Cardiac Enzymes: No results for input(s): CKTOTAL, CKMB, CKMBINDEX, TROPONINI in  the last 72 hours. BNP: Invalid input(s): POCBNP D-Dimer: No results for input(s): DDIMER in the last 72 hours. Hemoglobin A1C: No results for input(s): HGBA1C in the last 72 hours. Fasting Lipid Panel: Recent Labs    04/01/20 0532  CHOL 177  HDL 44  LDLCALC 119*  TRIG 70  CHOLHDL 4.0   Thyroid Function Tests: No results for input(s): TSH, T4TOTAL, T3FREE, THYROIDAB in the last 72 hours.  Invalid input(s): FREET3 Anemia Panel: No results for input(s): VITAMINB12, FOLATE, FERRITIN, TIBC, IRON, RETICCTPCT in the last 72 hours.  CARDIAC CATHETERIZATION  Result Date: 04/01/2020  Prox Cx to Mid Cx lesion is 10% stenosed.  Ost LAD to Prox LAD lesion is 30% stenosed.  Prox LAD to Mid LAD lesion is 50% stenosed.  1st Diag lesion is 40% stenosed.  Prox RCA lesion is 10% stenosed.  Ost RCA to Prox RCA lesion is 30% stenosed.  1.  Nonobstructive coronary artery disease with 50% stenosis mid LAD, patent stent mid  left circumflex, patent stents proximal and mid RCA,overall appears unchanged compared to previous cardiac catheterization from 05/16/2017 2.  Severe dilated cardiomyopathy, with inferior apical akinesis and anteroapical hypokinesis, estimated LV ejection fraction 20 to 25% Recommendations 1.  Continue medical therapy 2.  Continue aggressive risk factor modification 3.  Possible discharge home later today   ECHOCARDIOGRAM COMPLETE  Result Date: 03/31/2020    ECHOCARDIOGRAM REPORT   Patient Name:   Tina Salinas Date of Exam: 03/31/2020 Medical Rec #:  [id number]    Height:       60.0 in Accession #:    [id number]   Weight:       80.0 lb Date of Birth:  04/24/1946    BSA:          1.265 m Patient Age:    74 years     BP:           113/70 mmHg Patient Gender: F            HR:           69 bpm. Exam Location:  [facility] Procedure: 2D Echo, Color Doppler and Cardiac Doppler Indications:     I21.4 NSTEMI  History:         Patient has prior history of Echocardiogram examinations, most                   recent 10/02/2019. CHF; Risk Factors:Hypertension.  Sonographer:     Charmayne Sheer RDCS (AE) Referring Phys:  [id number] Clabe Seal Diagnosing Phys: Isaias Cowman MD  Sonographer Comments: Image acquisition challenging due to breast implants. IMPRESSIONS  1. Left ventricular ejection fraction, by estimation, is <20%. The left ventricle has severely decreased function. The left ventricle demonstrates regional wall motion abnormalities (see scoring diagram/findings for description). The left ventricular internal cavity size was moderately to severely dilated. Left ventricular diastolic parameters were normal.  2. Right ventricular systolic function is normal. The right ventricular size is normal.  3. A small pericardial effusion is present.  4. The mitral valve is normal in structure. Mild to moderate mitral valve regurgitation. No evidence of mitral stenosis.  5. Tricuspid valve regurgitation is mild to moderate.  6. The aortic valve is normal in structure. Aortic valve regurgitation is not visualized. No aortic stenosis is present.  7. The inferior vena cava is normal in size with greater than 50% respiratory variability, suggesting right atrial pressure of 3 mmHg. FINDINGS  Left Ventricle: Left ventricular ejection fraction, by estimation, is <20%. The left ventricle has severely decreased function. The left ventricle demonstrates regional wall motion abnormalities. The left ventricular internal cavity size was moderately to severely dilated. There is no left ventricular hypertrophy. Left ventricular diastolic parameters were normal.  LV Wall Scoring: The antero-lateral wall and basal inferolateral segment are akinetic. The anterior septum, mid inferoseptal segment, and basal inferoseptal segment are hypokinetic. Right Ventricle: The right ventricular size is normal. No increase in right ventricular wall thickness. Right ventricular systolic function is normal. Left Atrium: Left atrial size was normal in size.  Right Atrium: Right atrial size was normal in size. Pericardium: A small pericardial effusion is present. Mitral Valve: The mitral valve is normal in structure. Mild to moderate mitral valve regurgitation. No evidence of mitral valve stenosis. MV peak gradient, 4.4 mmHg. The mean mitral valve gradient is 1.0 mmHg. Tricuspid Valve: The tricuspid valve is normal in structure. Tricuspid valve regurgitation is mild to moderate. No evidence of tricuspid stenosis. Aortic Valve: The  aortic valve is normal in structure. Aortic valve regurgitation is not visualized. No aortic stenosis is present. Aortic valve mean gradient measures 4.0 mmHg. Aortic valve peak gradient measures 7.7 mmHg. Aortic valve area, by VTI measures 0.66 cm. Pulmonic Valve: The pulmonic valve was normal in structure. Pulmonic valve regurgitation is not visualized. No evidence of pulmonic stenosis. Aorta: The aortic root is normal in size and structure. Venous: The inferior vena cava is normal in size with greater than 50% respiratory variability, suggesting right atrial pressure of 3 mmHg. IAS/Shunts: No atrial level shunt detected by color flow Doppler.  LEFT VENTRICLE PLAX 2D LVIDd:         5.47 cm  Diastology LVIDs:         5.10 cm  LV e' medial:    3.05 cm/s LV PW:         0.89 cm  LV E/e' medial:  30.1 LV IVS:        0.72 cm  LV e' lateral:   3.48 cm/s LVOT diam:     1.50 cm  LV E/e' lateral: 26.4 LV SV:         17 LV SV Index:   14 LVOT Area:     1.77 cm  RIGHT VENTRICLE RV Basal diam:  2.67 cm LEFT ATRIUM             Index       RIGHT ATRIUM          Index LA diam:        3.30 cm 2.61 cm/m  RA Area:     8.92 cm LA Vol (A2C):   45.2 ml 35.74 ml/m RA Volume:   16.10 ml 12.73 ml/m LA Vol (A4C):   49.9 ml 39.46 ml/m LA Biplane Vol: 49.9 ml 39.46 ml/m  AORTIC VALVE                   PULMONIC VALVE AV Area (Vmax):    0.77 cm    PV Vmax:       1.09 m/s AV Area (Vmean):   0.81 cm    PV Vmean:      69.500 cm/s AV Area (VTI):     0.66 cm    PV  VTI:        0.183 m AV Vmax:           139.00 cm/s PV Peak grad:  4.8 mmHg AV Vmean:          87.600 cm/s PV Mean grad:  2.0 mmHg AV VTI:            0.261 m AV Peak Grad:      7.7 mmHg AV Mean Grad:      4.0 mmHg LVOT Vmax:         60.70 cm/s LVOT Vmean:        40.100 cm/s LVOT VTI:          0.098 m LVOT/AV VTI ratio: 0.37  AORTA Ao Root diam: 1.60 cm MITRAL VALVE MV Area (PHT): 3.26 cm    SHUNTS MV Peak grad:  4.4 mmHg    Systemic VTI:  0.10 m MV Mean grad:  1.0 mmHg    Systemic Diam: 1.50 cm MV Vmax:       1.05 m/s MV Vmean:      53.7 cm/s MV Decel Time: 233 msec MV E velocity: 91.70 cm/s MV A velocity: 39.00 cm/s MV E/A ratio:  2.35 Isaias Cowman MD Electronically  signed by Isaias Cowman MD Signature Date/Time: 03/31/2020/1:17:28 PM    Final      Echo LVEF 15 to 20% with severe dilated cardiomyopathy  TELEMETRY: Sinus rhythm:  ASSESSMENT AND PLAN:  Principal Problem:   NSTEMI (non-ST elevated myocardial infarction) ([place]) Active Problems:   Rheumatoid arthritis with rheumatoid factor (HCC)   Chronic combined systolic and diastolic CHF (congestive heart failure) (HCC)   PAF (paroxysmal atrial fibrillation) (HCC)   Chronic kidney disease (CKD), active medical management without dialysis, stage 3 (moderate)   Chronic venous insufficiency   CAD (coronary artery disease)   Non-ischemic cardiomyopathy (HCC)   Hypokalemia   Protein-calorie malnutrition, severe    1. Chest pain, with atypical features, nondiagnostic ECG, with mildly elevated high-sensitivity troponin.  Cardiac catheterization revealed nonobstructive coronary artery disease with 50% stenosis mid LAD, patent stents mid left circumflex, proximal and mid RCA, which appeared unchanged compared to previous cardiac catheterization 05/16/2017.  Chest pain likely noncardiac in nature, and appears more pleuritic or musculoskeletal in origin. 2.  Ischemic cardiomyopathy, with LVEF 20 to 25% 3.  Chronic systolic congestive heart  failure 4.  Paroxysmal atrial fibrillation, not on chronic anticoagulation due to falling risk, on amiodarone for rhythm control  Recommendations  1.  Continue current medications 2.  Continue amiodarone for rate and rhythm control 3.  Defer chronic anticoagulation due to falling risk 4.  Continue isosorbide mononitrate 30 mg daily 5.  May discharge home today 6.  Follow-up with Dr. Ubaldo Glassing in 1 week  Sign off for now, please call if any questions    Isaias Cowman, MD, PhD, [facility] 04/02/2020 8:30 AM

## 2020-04-02 NOTE — Discharge Summary (Signed)
Physician Discharge Summary  Tina Salinas SWN:[id number] DOB: 01/11/1946 DOA: 03/31/2020  PCP: Birdie Sons, MD  Admit date: 03/31/2020 Discharge date: 04/02/2020  Admitted From: Home Disposition:  Home Discharging physician: Dwyane Dee, MD  Recommendations for Outpatient Follow-up:  1. APS referral placed at discharge due to concern for unsafe living conditions at home. Patient has capacity at time of discharge 2. Follow up BP and adjust imdur or coreg as needed 3. Patient refused SNF/rehab prior to discharge   Patient discharged to home in Discharge Condition: stable CODE STATUS: Full Diet recommendation:  Diet Orders (From admission, onward)    Start     Ordered   04/02/20 0000  Diet - low sodium heart healthy        04/02/20 1425   04/01/20 1038  Diet Heart Room service appropriate? Yes; Fluid consistency: Thin  Diet effective now       Question Answer Comment  Room service appropriate? Yes   Fluid consistency: Thin      04/01/20 1037          Hospital Course: Tina Salinas is a 74  yo female with PMH CAD s/p PCI, HFpEF, PVD, RA (on plaquenil), CKDIII, PAF, mostly bed bound status at home who presented to the hospital with substernal/left-sided chest pain that has been worsening over approximately 2 days prior to admission.  Pain was described as sharp in nature.  She had no associated nausea/vomiting or diaphoresis or palpitations.  She called 911 and was brought to the hospital. She underwent work-up for ischemia with EKG and trending of troponins.  EKG was overall unremarkable however troponins did become elevated and cardiology was consulted and a heparin drip was started.  She was ultimately taken for heart catheterization on 04/01/2020.  She was found to have 50% stenosis in the proximal to mid LAD.  Remaining vessels had mild to moderate stenosis, no more than 40%.  She did not require any stenting or intervention.  She had patent stents noted in the mid left circumflex  and proximal and mid RCA. She was found to have severe dilated cardiomyopathy with inferior apical akinesis and anteroapical hypokinesis with LVEF estimated at 20 to 25%.  She was recommended to continue on adequate management and ongoing risk factor modification. She was started on Imdur for her ongoing angina.  Her dose of carvedilol was continued but patient was told this may need to be lowered to allow for blood pressure room with addition of Imdur. We discussed her med changes bedside and she was able to explain back the med changes and the reasons why (she will need BP evaluation at follow up to assess tolerance of imdur as she says she does not check her BP at home, and still explained the reasons and risk to me, but still does not want to check). Her BP did remain stable with monitoring in the hospital while on the regimen she will be on at home at discharge.   She had no desire to increase her physical mobility at home and appears to remain bedbound.  She states she has care at home who brings her food/groceries [name] comes in the mornings and [name] comes in the afternoons).  She has skin breakdown/wounds on her back from being mostly bedbound as well.  She is also asking for a pure wick catheter so that she does not have to get out of bed. I have evaluated her from a capacity standpoint and she very clearly has capacity for  making decisions regarding going home from the hospital after this hospitalization.  She very clearly explains that she has [name] and [name] who come check on her daily.  She states that she knows that home health will not come back out.  She does not feel that her house is uninhabitable but she does validate our concern about her going home.  Regardless, she very clearly can state that the worst case scenario would be her death if she had no help at home and was stuck in bed; however, she quickly states that she would call 911 if needing to go to appointments and could not get a ride,  but again falls back on her statements of talking about having "plenty of friends to come help". I do not believe that she lacks capacity.  Therefore, as the medical work-up has been completed and she is medically stabilized and she is also wishing to go home, discharge orders will be placed.  Transportation will be arranged for taking patient home.  CAD with stable angina -Heart catheterization negative for significant lesion.  Largest was LAD with 50% stenosis.  Previous stents were patent -Continue Imdur, Coreg; follow up BP at follow up visit -Continue amiodarone -Continue pravastatin -Appreciate cardiology evaluation  History of chronic combined systolic and diastolic dysfunction CHF Not in acute exacerbation Last known LVEF of 20 to 25%.  Heart cath 9/18 reveals similar EF Continue carvedilolandFurosemide PRN  Patient not on an ACE/ARB due to renal insufficiency  Hypokalemia -Replete and recheck as needed  Stage III chronic kidney disease Stable Monitor closely during this hospitalization   Moderate protein calorie malnutrition(BMI 15.62 kg/m2) Maintain low-sodium diet Administration of nutrition supplements Continue Megace   Paroxysmal atrial fibrillation Continue amiodarone and carvedilol for rate control Patient not on long-term anticoagulation therapy due to increased risk for fall   History of rheumatoid arthritis Continue Plaquenil Continue weekly methotrexate upon discharge  Social issues Capacity of decision making - see hospital course above; I assessed her capacity prior to discharge; despite her choosing to go home to a questionable home environment, she does understand her decisions and can explain the reasoning to me; she feels that she adequately has help at home from [place] who are friends that come daily and check on her; she says if she ever can't get to a doctor appointment she calls EMS. She tells me if she were never to have  anyone come check on her and she could not get out of bed that she knows she could die. She was offered rehab and SNF, etc prior to discharge and declined multiple times.  - given her ability to adequately understand her decisions and the reasoning behind them, I feel she has appropriate capacity for decision making at least regarding the context of her discharging home from this hospitalization. Future hospitalizations and the context of these hospitalizations need to be considered individually and her capacity at this time does not apply to all hospitalizations as potential future infections, etc could play a role in her ability to make decisions at those times.   The patient's chronic medical conditions were treated accordingly per the patient's home medication regimen except as noted.  On day of discharge, patient was felt deemed stable for discharge. Patient/family member advised to call PCP or come back to ER if needed.   Discharge Diagnoses:   Principal Diagnosis: NSTEMI (non-ST elevated myocardial infarction) [facility])  Active Hospital Problems   Diagnosis Date Noted  . NSTEMI (non-ST elevated  myocardial infarction) ([place]) 03/31/2020  . Protein-calorie malnutrition, severe 10/07/2019  . Hypokalemia 10/03/2019  . CAD (coronary artery disease) 10/02/2019  . Non-ischemic cardiomyopathy ([place]) 10/02/2019  . Chronic venous insufficiency 10/21/2018  . Chronic kidney disease (CKD), active medical management without dialysis, stage 3 (moderate) 07/15/2018  . Chronic combined systolic and diastolic CHF (congestive heart failure) ([place]) 11/16/2017  . PAF (paroxysmal atrial fibrillation) ([place]) 11/16/2017  . Rheumatoid arthritis with rheumatoid factor ([employer]) 10/07/2013    Resolved Hospital Problems  No resolved problems to display.    Discharge Instructions    Diet - low sodium heart healthy   Complete by: As directed    Discharge wound care:   Complete by: As directed    Apply Desitin to buttocks  twice daily, leave foam dressing off.   Increase activity slowly   Complete by: As directed      Allergies as of 04/02/2020      Reactions   Etodolac    Other reaction(s): Unknown   Sulfa Antibiotics    Tetracycline       Medication List    STOP taking these medications   CALCIUM CARBONATE PO   GNP FIBER THERAPY PO     TAKE these medications   alendronate 70 MG tablet Commonly known as: FOSAMAX Take 70 mg by mouth once a week.   amiodarone 200 MG tablet Commonly known as: PACERONE Take 200 mg by mouth daily.   aspirin 81 MG tablet Take 81 mg by mouth daily.   calcium-vitamin D 500-200 MG-UNIT tablet Commonly known as: OSCAL WITH D Take 2 tablets by mouth daily with breakfast.   carvedilol 3.125 MG tablet Commonly known as: COREG Take 3.125 mg by mouth 2 (two) times daily.   cholecalciferol 1000 units tablet Commonly known as: VITAMIN D Take 1,000 Units by mouth daily.   docusate sodium 100 MG capsule Commonly known as: COLACE Take 1 capsule (100 mg total) by mouth daily.   ferrous [id number] C-folic acid capsule Commonly known as: TRINSICON / FOLTRIN Take 1 capsule by mouth 2 (two) times daily after a meal.   furosemide 20 MG tablet Commonly known as: Lasix Take 1 tablet (20 mg total) by mouth daily as needed for fluid or edema (weight gain of 3lbs in 1 day or 5 lbs in 3 days).   HYDROcodone-acetaminophen 7.5-325 MG tablet Commonly known as: NORCO Take 1-2 tablets by mouth 3 (three) times daily as needed for moderate pain.   hydroxychloroquine 200 MG tablet Commonly known as: PLAQUENIL Take 400 mg by mouth daily.   isosorbide mononitrate 30 MG 24 hr tablet Commonly known as: IMDUR Take 1 tablet (30 mg total) by mouth daily. Start taking on: April 03, 2020   megestrol 400 MG/10ML suspension Commonly known as: MEGACE Take 10 mLs (400 mg total) by mouth daily.   Methotrexate (PF) 25 MG/0.5ML Soaj Inject 1.4 mLs into the skin once a  week.   mirtazapine 30 MG tablet Commonly known as: Remeron Take 1 tablet (30 mg total) by mouth at bedtime.   oxybutynin 5 MG tablet Commonly known as: DITROPAN TAKE 1 TABLET BY MOUTH TWICE DAILY AS NEEDED FOR BLADDER SPASMS   pravastatin 40 MG tablet Commonly known as: PRAVACHOL Take 1 tablet (40 mg total) by mouth at bedtime.   predniSONE 2.5 MG tablet Commonly known as: DELTASONE Take 2.5 mg by mouth daily with breakfast.   promethazine 25 MG tablet Commonly known as: PHENERGAN Take 25 mg by mouth every 6 (six)  hours as needed for nausea or vomiting.   Vitamin D (Ergocalciferol) 1.25 MG (50000 UNIT) Caps capsule Commonly known as: DRISDOL Take 1 capsule (50,000 Units total) by mouth every 7 (seven) days.   Xalatan 0.005 % ophthalmic solution Generic drug: latanoprost Place 1 drop into both eyes at bedtime.            Discharge Care Instructions  (From admission, onward)         Start     Ordered   04/02/20 0000  Discharge wound care:       Comments: Apply Desitin to buttocks twice daily, leave foam dressing off.   04/02/20 1425          Allergies  Allergen Reactions  . Etodolac     Other reaction(s): Unknown  . Sulfa Antibiotics   . Tetracycline     Consultations: Cardiology  Discharge Exam: BP (!) 90/45 (BP Location: Left Arm)   Pulse 69   Temp 97.7 F (36.5 C) (Oral)   Resp 17   Ht 5' (1.524 m)   Wt 43.7 kg Comment: Patient didnt want to be moved for accurate weight.  SpO2 98%   BMI 18.82 kg/m  General appearance: Chronically ill-appearing elderly woman laying in bed in no distress Head: Normocephalic, without obvious abnormality, atraumatic Eyes: EOMI Lungs: clear to auscultation bilaterally Heart: irregularly irregular rhythm and S1, S2 normal Abdomen: normal findings: bowel sounds normal and soft, non-tender Extremities: Bilateral lower extremity pitting edema with cool to touch feet.  Unable to palpate distal pulses Skin: Cool  lower extremities.  Dry and intact Neurologic: no focal deficits  The results of significant diagnostics from this hospitalization (including imaging, microbiology, ancillary and laboratory) are listed below for reference.   Microbiology: Recent Results (from the past 240 hour(s))  SARS Coronavirus 2 by RT PCR (hospital order, performed in [facility] hospital lab) Nasopharyngeal Nasopharyngeal Swab     Status: None   Collection Time: 03/31/20  4:53 AM   Specimen: Nasopharyngeal Swab  Result Value Ref Range Status   SARS Coronavirus 2 NEGATIVE NEGATIVE Final    Comment: (NOTE) SARS-CoV-2 target nucleic acids are NOT DETECTED.  The SARS-CoV-2 RNA is generally detectable in upper and lower respiratory specimens during the acute phase of infection. The lowest concentration of SARS-CoV-2 viral copies this assay can detect is 250 copies / mL. A negative result does not preclude SARS-CoV-2 infection and should not be used as the sole basis for treatment or other patient management decisions.  A negative result may occur with improper specimen collection / handling, submission of specimen other than nasopharyngeal swab, presence of viral mutation(s) within the areas targeted by this assay, and inadequate number of viral copies (<250 copies / mL). A negative result must be combined with clinical observations, patient history, and epidemiological information.  Fact Sheet for Patients:   StrictlyIdeas.no  Fact Sheet for Healthcare Providers: BankingDealers.co.za  This test is not yet approved or  cleared by the [place] FDA and has been authorized for detection and/or diagnosis of SARS-CoV-2 by FDA under an Emergency Use Authorization (EUA).  This EUA will remain in effect (meaning this test can be used) for the duration of the COVID-19 declaration under Section 564(b)(1) of the Act, 21 U.S.C. section 360bbb-3(b)(1), unless the  authorization is terminated or revoked sooner.  Performed at [facility], [address] [place], [place] [address]      Labs: BNP (last 3 results) Recent Labs    10/02/19  0229 11/28/19 1400 11/30/19 0736  BNP 2,680.0* 3,486.0* 353.6*   Basic Metabolic Panel: Recent Labs  Lab 03/31/20 0021 03/31/20 0453 03/31/20 1052 04/01/20 0532 04/02/20 0651  NA 136  --  136 135 140  K 2.7*  --  4.6 4.1 3.9  CL 104  --  106 103 104  CO2 21*  --  21* 23 26  GLUCOSE 147*  --  139* 112* 106*  BUN 13  --  12 11 11   CREATININE 0.71  --  0.79 0.62 0.76  CALCIUM 8.1*  --  8.1* 8.4* 8.6*  MG  --  2.0  --   --  2.4   Liver Function Tests: No results for input(s): AST, ALT, ALKPHOS, BILITOT, PROT, ALBUMIN in the last 168 hours. No results for input(s): LIPASE, AMYLASE in the last 168 hours. No results for input(s): AMMONIA in the last 168 hours. CBC: Recent Labs  Lab 03/31/20 0021 04/01/20 0532 04/02/20 0651  WBC 8.0 5.3 4.5  NEUTROABS  --   --  3.3  HGB 10.6* 9.9* 10.2*  HCT 34.5* 32.1* 33.0*  MCV 90.6 91.2 90.4  PLT 222 243 299   Cardiac Enzymes: No results for input(s): CKTOTAL, CKMB, CKMBINDEX, TROPONINI in the last 168 hours. BNP: Invalid input(s): POCBNP CBG: No results for input(s): GLUCAP in the last 168 hours. D-Dimer No results for input(s): DDIMER in the last 72 hours. Hgb A1c No results for input(s): HGBA1C in the last 72 hours. Lipid Profile Recent Labs    04/01/20 0532  CHOL 177  HDL 44  LDLCALC 119*  TRIG 70  CHOLHDL 4.0   Thyroid function studies No results for input(s): TSH, T4TOTAL, T3FREE, THYROIDAB in the last 72 hours.  Invalid input(s): FREET3 Anemia work up No results for input(s): VITAMINB12, FOLATE, FERRITIN, TIBC, IRON, RETICCTPCT in the last 72 hours. Urinalysis    Component Value Date/Time   COLORURINE YELLOW (A) 10/08/2019 2100   APPEARANCEUR CLEAR (A) 10/08/2019 2100   LABSPEC 1.008 10/08/2019 2100   PHURINE 7.0  10/08/2019 2100   GLUCOSEU NEGATIVE 10/08/2019 2100   HGBUR NEGATIVE 10/08/2019 2100   [place] NEGATIVE 10/08/2019 2100   [place] NEGATIVE 10/08/2019 2100   PROTEINUR NEGATIVE 10/08/2019 2100   NITRITE NEGATIVE 10/08/2019 2100   LEUKOCYTESUR NEGATIVE 10/08/2019 2100   Sepsis Labs Invalid input(s): PROCALCITONIN,  WBC,  LACTICIDVEN Microbiology Recent Results (from the past 240 hour(s))  SARS Coronavirus 2 by RT PCR (hospital order, performed in [place] hospital lab) Nasopharyngeal Nasopharyngeal Swab     Status: None   Collection Time: 03/31/20  4:53 AM   Specimen: Nasopharyngeal Swab  Result Value Ref Range Status   SARS Coronavirus 2 NEGATIVE NEGATIVE Final    Comment: (NOTE) SARS-CoV-2 target nucleic acids are NOT DETECTED.  The SARS-CoV-2 RNA is generally detectable in upper and lower respiratory specimens during the acute phase of infection. The lowest concentration of SARS-CoV-2 viral copies this assay can detect is 250 copies / mL. A negative result does not preclude SARS-CoV-2 infection and should not be used as the sole basis for treatment or other patient management decisions.  A negative result may occur with improper specimen collection / handling, submission of specimen other than nasopharyngeal swab, presence of viral mutation(s) within the areas targeted by this assay, and inadequate number of viral copies (<250 copies / mL). A negative result must be combined with clinical observations, patient history, and epidemiological information.  Fact Sheet for Patients:   StrictlyIdeas.no  Fact  Sheet for Healthcare Providers: BankingDealers.co.za  This test is not yet approved or  cleared by the [place] and has been authorized for detection and/or diagnosis of SARS-CoV-2 by FDA under an Emergency Use Authorization (EUA).  This EUA will remain in effect (meaning this test can be used) for the duration of  the COVID-19 declaration under Section 564(b)(1) of the Act, 21 U.S.C. section 360bbb-3(b)(1), unless the authorization is terminated or revoked sooner.  Performed at [facility], [address] [place], [place] [address]     Procedures/Studies: [place] Chest 1 View  Result Date: 03/31/2020 CLINICAL DATA:  Left-sided chest pain EXAM: CHEST  1 VIEW COMPARISON:  11/28/2019, CT 10/02/2019 FINDINGS: Bilateral breast implants. Small left-sided pleural effusion with basilar consolidation. Right lung grossly clear. Borderline to mild cardiomegaly with aortic atherosclerosis. No pneumothorax. IMPRESSION: Small left-sided pleural effusion with left basilar consolidation, either reflecting atelectasis or pneumonia. Electronically Signed   By: Donavan Foil M.D.   On: 03/31/2020 01:47   CT Angio Chest PE W and/or Wo Contrast  Result Date: 03/31/2020 CLINICAL DATA:  Left-sided chest pain for 2 days EXAM: CT ANGIOGRAPHY CHEST WITH CONTRAST TECHNIQUE: Multidetector CT imaging of the chest was performed using the standard protocol during bolus administration of intravenous contrast. Multiplanar CT image reconstructions and MIPs were obtained to evaluate the vascular anatomy. CONTRAST:  36mL OMNIPAQUE IOHEXOL 350 MG/ML SOLN COMPARISON:  10/02/2019, chest x-ray from earlier in the same day. FINDINGS: Cardiovascular: Atherosclerotic calcifications of the aorta are again identified. No aneurysmal dilatation or dissection is noted. Mild pericardial effusion is noted stable from the prior exam. Mild cardiac enlargement is seen. Coronary calcifications are noted. Pulmonary artery is well visualized within normal branching pattern. No filling defect to suggest pulmonary embolism is seen. Mediastinum/Nodes: Thoracic inlet is within normal limits. No sizable hilar or mediastinal adenopathy is noted. The esophagus as visualized is within normal limits. Lungs/Pleura: Right lung is well aerated. Some very minimal right  lower lobe infiltrate is seen. Mild left lower lobe atelectasis/infiltrate is noted with associated effusion. The overall appearance is similar to that seen on the prior exam. The remainder of the left lung is well aerated. Upper Abdomen: Visualized upper abdomen is unremarkable. Musculoskeletal: Bilateral breast implants are noted. Degenerative changes of the thoracic spine are seen. Review of the MIP images confirms the above findings. IMPRESSION: No evidence of pulmonary emboli. Recurrent left lower lobe atelectasis and associated effusion. Patchy right lower lobe opacity which may represent scarring from prior pneumonia. Aortic Atherosclerosis (ICD10-I70.0). Electronically Signed   By: Inez Catalina M.D.   On: 03/31/2020 06:01   CARDIAC CATHETERIZATION  Result Date: 04/01/2020  Prox Cx to Mid Cx lesion is 10% stenosed.  Ost LAD to Prox LAD lesion is 30% stenosed.  Prox LAD to Mid LAD lesion is 50% stenosed.  1st Diag lesion is 40% stenosed.  Prox RCA lesion is 10% stenosed.  Ost RCA to Prox RCA lesion is 30% stenosed.  1.  Nonobstructive coronary artery disease with 50% stenosis mid LAD, patent stent mid left circumflex, patent stents proximal and mid RCA,overall appears unchanged compared to previous cardiac catheterization from 05/16/2017 2.  Severe dilated cardiomyopathy, with inferior apical akinesis and anteroapical hypokinesis, estimated LV ejection fraction 20 to 25% Recommendations 1.  Continue medical therapy 2.  Continue aggressive risk factor modification 3.  Possible discharge home later today   ECHOCARDIOGRAM COMPLETE  Result Date: 03/31/2020    ECHOCARDIOGRAM REPORT   Patient Name:  Tina Salinas Date of Exam: 03/31/2020 Medical Rec #:  [id number]    Height:       60.0 in Accession #:    [id number]   Weight:       80.0 lb Date of Birth:  1946-03-20    BSA:          1.265 m Patient Age:    74 years     BP:           113/70 mmHg Patient Gender: F            HR:           69 bpm. Exam  Location:  [facility] Procedure: 2D Echo, Color Doppler and Cardiac Doppler Indications:     I21.4 NSTEMI  History:         Patient has prior history of Echocardiogram examinations, most                  recent 10/02/2019. CHF; Risk Factors:Hypertension.  Sonographer:     Charmayne Sheer RDCS (AE) Referring Phys:  [id number] Clabe Seal Diagnosing Phys: Isaias Cowman MD  Sonographer Comments: Image acquisition challenging due to breast implants. IMPRESSIONS  1. Left ventricular ejection fraction, by estimation, is <20%. The left ventricle has severely decreased function. The left ventricle demonstrates regional wall motion abnormalities (see scoring diagram/findings for description). The left ventricular internal cavity size was moderately to severely dilated. Left ventricular diastolic parameters were normal.  2. Right ventricular systolic function is normal. The right ventricular size is normal.  3. A small pericardial effusion is present.  4. The mitral valve is normal in structure. Mild to moderate mitral valve regurgitation. No evidence of mitral stenosis.  5. Tricuspid valve regurgitation is mild to moderate.  6. The aortic valve is normal in structure. Aortic valve regurgitation is not visualized. No aortic stenosis is present.  7. The inferior vena cava is normal in size with greater than 50% respiratory variability, suggesting right atrial pressure of 3 mmHg. FINDINGS  Left Ventricle: Left ventricular ejection fraction, by estimation, is <20%. The left ventricle has severely decreased function. The left ventricle demonstrates regional wall motion abnormalities. The left ventricular internal cavity size was moderately to severely dilated. There is no left ventricular hypertrophy. Left ventricular diastolic parameters were normal.  LV Wall Scoring: The antero-lateral wall and basal inferolateral segment are akinetic. The anterior septum, mid inferoseptal segment, and basal inferoseptal segment are hypokinetic. Right  Ventricle: The right ventricular size is normal. No increase in right ventricular wall thickness. Right ventricular systolic function is normal. Left Atrium: Left atrial size was normal in size. Right Atrium: Right atrial size was normal in size. Pericardium: A small pericardial effusion is present. Mitral Valve: The mitral valve is normal in structure. Mild to moderate mitral valve regurgitation. No evidence of mitral valve stenosis. MV peak gradient, 4.4 mmHg. The mean mitral valve gradient is 1.0 mmHg. Tricuspid Valve: The tricuspid valve is normal in structure. Tricuspid valve regurgitation is mild to moderate. No evidence of tricuspid stenosis. Aortic Valve: The aortic valve is normal in structure. Aortic valve regurgitation is not visualized. No aortic stenosis is present. Aortic valve mean gradient measures 4.0 mmHg. Aortic valve peak gradient measures 7.7 mmHg. Aortic valve area, by VTI measures 0.66 cm. Pulmonic Valve: The pulmonic valve was normal in structure. Pulmonic valve regurgitation is not visualized. No evidence of pulmonic stenosis. Aorta: The aortic root is normal in size and structure. Venous: The inferior vena cava is  normal in size with greater than 50% respiratory variability, suggesting right atrial pressure of 3 mmHg. IAS/Shunts: No atrial level shunt detected by color flow Doppler.  LEFT VENTRICLE PLAX 2D LVIDd:         5.47 cm  Diastology LVIDs:         5.10 cm  LV e' medial:    3.05 cm/s LV PW:         0.89 cm  LV E/e' medial:  30.1 LV IVS:        0.72 cm  LV e' lateral:   3.48 cm/s LVOT diam:     1.50 cm  LV E/e' lateral: 26.4 LV SV:         17 LV SV Index:   14 LVOT Area:     1.77 cm  RIGHT VENTRICLE RV Basal diam:  2.67 cm LEFT ATRIUM             Index       RIGHT ATRIUM          Index LA diam:        3.30 cm 2.61 cm/m  RA Area:     8.92 cm LA Vol (A2C):   45.2 ml 35.74 ml/m RA Volume:   16.10 ml 12.73 ml/m LA Vol (A4C):   49.9 ml 39.46 ml/m LA Biplane Vol: 49.9 ml 39.46 ml/m   AORTIC VALVE                   PULMONIC VALVE AV Area (Vmax):    0.77 cm    PV Vmax:       1.09 m/s AV Area (Vmean):   0.81 cm    PV Vmean:      69.500 cm/s AV Area (VTI):     0.66 cm    PV VTI:        0.183 m AV Vmax:           139.00 cm/s PV Peak grad:  4.8 mmHg AV Vmean:          87.600 cm/s PV Mean grad:  2.0 mmHg AV VTI:            0.261 m AV Peak Grad:      7.7 mmHg AV Mean Grad:      4.0 mmHg LVOT Vmax:         60.70 cm/s LVOT Vmean:        40.100 cm/s LVOT VTI:          0.098 m LVOT/AV VTI ratio: 0.37  AORTA Ao Root diam: 1.60 cm MITRAL VALVE MV Area (PHT): 3.26 cm    SHUNTS MV Peak grad:  4.4 mmHg    Systemic VTI:  0.10 m MV Mean grad:  1.0 mmHg    Systemic Diam: 1.50 cm MV Vmax:       1.05 m/s MV Vmean:      53.7 cm/s MV Decel Time: 233 msec MV E velocity: 91.70 cm/s MV A velocity: 39.00 cm/s MV E/A ratio:  2.35 Isaias Cowman MD Electronically signed by Isaias Cowman MD Signature Date/Time: 03/31/2020/1:17:28 PM    Final      Time coordinating discharge: Over 30 minutes    Dwyane Dee, MD  [facility] 04/02/2020, 2:25 PM Pager: Secure chat  If 7PM-7AM, please contact night-coverage [email or web address] Password [email or web address]

## 2020-04-02 NOTE — Consult Note (Addendum)
[facility] Nurse Consult Note: Reason for Consult: Consult requested for buttocks/sacrum.  Pt currently has a Purewick to attempt to contain urine, but states she was frequently incontinent at home and had no one to assist with turning and cleaning.  She requests that primary team write a prescription for a Purewick-type device at home and then her insurance will approve it for urinary containment.  Discussed this request with the primary physician at the bedside.  Wound type: Bilat buttocks/sacrum with red moist patchy areas of partial thickness skin loss, scattered and spread across an area approx 8X8X.1cm.  Appearance is consistent with previous blisters which have ruptured and evolved into partial thickness skin loss which are beginning to dry and peel and are related to moisture associated skin damage, not pressure injuries.  Dressing procedure/placement/frequency: Topical treatment orders provided for bedside nurses to perform as follows to repel moisture and promote healing: Apply Desitin to buttocks BID, leave foam dressing off. Please re-consult if further assistance is needed.  Thank-you,  Julien Girt MSN, [place], [place], [place], [place]

## 2020-04-05 ENCOUNTER — Telehealth: Payer: Self-pay

## 2020-04-05 NOTE — Telephone Encounter (Signed)
Transition Care Management Follow-up Telephone Call  Date of discharge and from where: [facility] on 04/02/20  How have you been since you were released from the hospital? Still in pain from bedsore but states her chest pain is better. Pain level is currently an 8 and it comes an goes. Pt has several sores on her bottom. Pt has been shifting around to take pressure of the areas and her friend is applying an OTC ointment and bandages to sores daily. Pt is unsure what the OTC ointment is and is wanting a stronger cream. Pt is taking Norco as needed for pain. Pt has had some SOB but states she uses oxygen when SOB.  Declines fever, weakness, other pain or n/v/d.   Any questions or concerns? Yes, pt is wanting a prescription cream for bedsores and is also wanting a prescription for an air mattress.  Items Reviewed:  Did the pt receive and understand the discharge instructions provided? Yes   Medications obtained and verified? No, pt declined and stated "it was none of my business."  Any new allergies since your discharge? Pt would not answer this question.  Dietary orders reviewed? Yes  Do you have support at home? Yes   Other (ie: DME, Home Health, etc): N/A, PT is already coming out to home twice weekly.  Functional Questionnaire: (I = Independent and D = Dependent)  Bathing/Dressing- I   Meal Prep- Pt would not answer.  Eating- I  Maintaining continence- I  Transferring/Ambulation- D, uses a walker.  Managing Meds- I   Follow up appointments reviewed:    PCP Hospital f/u appt confirmed? No  , pt declined scheduling a HFU with PCP at this time.   [facility] f/u appt confirmed? N/A   Are transportation arrangements needed? Pt stated she did not have transportation and then when offered a transportation referral she declined it and stated she took care of herself and had friends to assist her. Pt declined setting up a HFU apt.   If their condition worsens, is the pt aware  to call  their PCP or go to the ED? Yes  Was the patient provided with contact information for the PCP's office or ED? Yes  Was the pt encouraged to call back with questions or concerns? Yes

## 2020-04-05 NOTE — Telephone Encounter (Signed)
More info please.

## 2020-04-05 NOTE — Telephone Encounter (Signed)
Pt called back and said she does need a new order for PT. Pt states she would like this order through [facility]. Is this ok to reorder?

## 2020-04-05 NOTE — Telephone Encounter (Unsigned)
Copied from [place] [phone]. Topic: General - Other >> Apr 05, 2020 11:18 AM Rainey Pines A wrote: Patient caregiver would like a callback from [facility] in regards to an order being placed for a hoyer lift for patient. Please advise Best contact [phone]

## 2020-04-05 NOTE — Telephone Encounter (Signed)
No HFU scheduled at this time. Next apt scheduled is a CPE on 05/07/20.

## 2020-04-06 ENCOUNTER — Telehealth: Payer: Self-pay

## 2020-04-06 DIAGNOSIS — G809 Cerebral palsy, unspecified: Secondary | ICD-10-CM | POA: Diagnosis not present

## 2020-04-06 DIAGNOSIS — J9601 Acute respiratory failure with hypoxia: Secondary | ICD-10-CM | POA: Diagnosis not present

## 2020-04-06 DIAGNOSIS — N1831 Chronic kidney disease, stage 3a: Secondary | ICD-10-CM | POA: Diagnosis not present

## 2020-04-06 DIAGNOSIS — I5043 Acute on chronic combined systolic (congestive) and diastolic (congestive) heart failure: Secondary | ICD-10-CM | POA: Diagnosis not present

## 2020-04-06 MED ORDER — CEFDINIR 300 MG PO CAPS: 600.0000 mg | ORAL_CAPSULE | Freq: Every day | ORAL | 0 refills | Status: AC

## 2020-04-06 NOTE — Telephone Encounter (Signed)
Copied from [place] [phone]. Topic: General - Other >> Apr 06, 2020 10:57 AM Lennox Solders wrote: Reason for CRM: Pt is calling and would like Tina Salinas to return her call concerning a back of sitter

## 2020-04-06 NOTE — Telephone Encounter (Signed)
I called patient and advised her as below. Patient states the home health nurse hasn't come this week since she was discharged from the hospital. Patient says she has been calling the Home health agency and they told her the home health nurse should be coming by to see her today or either some time this week.

## 2020-04-06 NOTE — Telephone Encounter (Signed)
Copied from [name] [phone]. Topic: General - Other >> Apr 06, 2020  9:48 AM Leward Quan A wrote: Reason for CRM: Patient called to ask Dr Caryn Section to prescribe her an antibiotic for a blister she have on her buttock. Per patient she is bed ridden and can not come into the office to be seen. Would like a call back at Ph# [phone]

## 2020-04-06 NOTE — Telephone Encounter (Signed)
[facility] Has her home health nurse been able to evaluate it to see if she needs any other treatment?

## 2020-04-07 ENCOUNTER — Telehealth: Payer: Self-pay

## 2020-04-07 NOTE — Telephone Encounter (Signed)
Copied from [place] [id number]. Topic: General - Other >> Apr 07, 2020 10:19 AM Tina Salinas wrote: Reason for CRM: Pt requests that the office manager call her back because she is not happy that someone called and discussed her personal information with one of the home health people. Pt requests to be contacted today if possible

## 2020-04-07 NOTE — Telephone Encounter (Signed)
Caregiver Tina Salinas calling this am to advise no one has been to the home to evaluate the pt.  They have not seen Home Health.  She advises the pt needs a lot of care. She cannot get up by herself, she is bedridden. Pt has bed sores.  Tina Salinas would like a hoyer lift then pt could get out of bed. Tina Salinas has been bathing the pt and helping take care of her too.  Her no is [phone]  There are 3 sisters that helping take care of pt and she is paying out of pocket cash to them.

## 2020-04-08 NOTE — Telephone Encounter (Signed)
Pt is calling and would like roshena to return her call concerning bed sores

## 2020-04-08 NOTE — Telephone Encounter (Signed)
Pt called and stated that she was able to find the air mattress for her hospital bed at [facility] medical supply / Pt stated that they are needing an order to supply this mattress to the Pt / Pt asked if this can be done asap due to her having sores and not being comfortable on her current hospital mattress/ please advise asap/Pt called earlier and is still awaiting a call back

## 2020-04-08 NOTE — Telephone Encounter (Signed)
Patient is calling to request a call back today request an air mattress and would like to know where she can get one. Patient is also requesting an order to be placed to PT to resume PT Her previous PT Seth Bake. Patient is [phone]

## 2020-04-12 ENCOUNTER — Telehealth: Payer: Self-pay | Admitting: Family Medicine

## 2020-04-12 ENCOUNTER — Telehealth: Payer: Self-pay

## 2020-04-12 NOTE — Telephone Encounter (Signed)
Did she every hear from [facility] for physical Therapy. They should have contacted her last week.

## 2020-04-12 NOTE — Telephone Encounter (Signed)
Order for air matress written and sent to Medical records to fax.

## 2020-04-12 NOTE — Telephone Encounter (Signed)
Ernst Spell, pts caregiver, called and is requesting to have a stronger cream for pts bed sores due to the current one not working. She states that one day it helps and the next day it is back to how it was. Please advise.      [facility] - [place], [place] - [address]  [place] [address]  Phone: [phone] Fax: [phone]  Hours: Not open 24 hours

## 2020-04-12 NOTE — Telephone Encounter (Signed)
lmtcb . KW 

## 2020-04-12 NOTE — Telephone Encounter (Signed)
Message being responded to in a different encounter.

## 2020-04-12 NOTE — Telephone Encounter (Signed)
Copied from [place] [id number]. Topic: General - Inquiry >> Apr 08, 2020  3:42 PM Gillis Ends D wrote: Reason for CRM: Patient wants an order from a doctor to get an air mattress. She needs the order sent in today so she can get it. Please advise >> Apr 12, 2020  9:20 AM Celene Kras wrote: Pts caregiver called in regarding receiving this mattress for pt. Please advise.  >> Apr 09, 2020 11:07 AM Celene Kras wrote: Pt states that she received a call, with no VM and is requesting a call back. Please advise.

## 2020-04-12 NOTE — Telephone Encounter (Signed)
Patient advised of order being written for air mattress, she would like to know if she could be prescribed a cream for her bed sores and if she could be placed back with Seth Bake that she had PT with before. Please advise?

## 2020-04-12 NOTE — Telephone Encounter (Signed)
Patient says that she has heard from them.

## 2020-04-12 NOTE — Telephone Encounter (Signed)
Pt is calling back again about this air mattress, very unhappy, states insurance will pay for it, but no one will contact her, has asked for office manager to call her and no one doing anything. Pt states if no one calls her back today she will call back herself. [phone]

## 2020-04-12 NOTE — Telephone Encounter (Signed)
Isn't there a home health doing wound care? If not then we need to order one. The home health nurse has all the creams that she will need.

## 2020-04-14 ENCOUNTER — Telehealth: Payer: Self-pay

## 2020-04-14 NOTE — Telephone Encounter (Signed)
I spoke with Fenton Malling, PA-C about patients situation. I was advised to do the following per Fenton Malling PA-C:     -would recommend for [place] to call 911 for her and have someone come do a wellness check for her. It sounds like a bad situation and I feel that she should probably be seen soon by someone      I called 911 and spoke with an operator. I advised them of patient's situation. Patient's home address given and the operator advised me that they would send someone out to check on patient.

## 2020-04-14 NOTE — Telephone Encounter (Signed)
Patient is calling again regarding order for an air mattress.  She states that she has bed sores and really needs the mattress.  Please respond as soon as possible.

## 2020-04-14 NOTE — Telephone Encounter (Signed)
I called and spoke with patient to give her an update. Patient mentioned to me that she" feels like she is going to die". She states she is not in any pain or distress but she is emotionally overwhelmed with her medical problems and wants to start back on PT to improve her strength. Patient says she had a friend staying with her, but they left in the middle of the night last night. Patient says she hasn't had any water or food all day today and her mouth is dry. I called Crystal back and asked if she could have a nurse or nurse aid come out to patients home tonight. Crystal says the earliest that they could get someone out there would be tomorrow morning. I spoke with Dr. Rosanna Randy and was advised to give OK for home health to come out to patients home tomorrow morning. I called patient and advised her that a nurse would come out to her home in the morning. Patient verbalized understanding. Patient says she is wearing a depends and it is soaking wet. She reports her lips are chapped and she is thirsty. Patient says she doesn't have any friends or neighbors to come and check on her because they all left her and said she was crazy. Patient refuses to call EMS to come out and assess her. She says they are just going to take her back to the hospital and she doesn't want to go back there. Patient says she called the police earlier today and the police said that if they had to come out one more time to visit her then they would "put her away". I advised patient that she should call 911 if she feels like her condition is worsening. Patient refused again and says she is not going back to the hospital.

## 2020-04-14 NOTE — Telephone Encounter (Signed)
I called [facility] and spoke with clinical manager Pete Pelt. She advised me that home nursing visits and PT was put on hold when patient was recently hospitalized on 03/31/2020. They have not received any orders to resume home visits. Crystal states that they really cant take patient back because she doesn't have a caregiver in the home that stays with her. Crystal says that patient needs a caregiver in the home so that they are able to teach someone how to care for patient on the days they don't come out to the home. Crystal says she can have a nurse come out tomorrow to assess the bedsores since there is a change in condition. Crystal was able to take a verbal order from me to have nursing come out to patients home to assess wounds. She will need a new home health order placed for nursing to assess and provide wound care.   In regards to physical therapy Crystal says that patient has reached her maximum potential with PT, and once a patient reaches their maximum potential or doesn't improve, they usually discharge the patient.  If you wish for patient to resume PT [facility] will also need orders to resume PT.

## 2020-04-14 NOTE — Telephone Encounter (Signed)
Per telephone message from 04/06/2020 order for air mattress written and sent to medical records on 04/12/2020. Has this order been faxed and taken care of?

## 2020-04-14 NOTE — Telephone Encounter (Signed)
Copied from [place] [phone]. Topic: General - Other >> Apr 14, 2020 12:20 PM Hinda Lenis D wrote: PT has question about Home Health services, wants to speak with a nurse / please advise

## 2020-04-15 ENCOUNTER — Telehealth: Payer: Self-pay

## 2020-04-15 ENCOUNTER — Ambulatory Visit: Payer: Medicare HMO

## 2020-04-15 NOTE — Telephone Encounter (Signed)
Dr. Caryn Section can you please specify what type of air mattress is okay for patient. KW

## 2020-04-15 NOTE — Telephone Encounter (Signed)
Low pressure mattress. Please send copy of hospital discharge summary from earlier the month. Marland Kitchen

## 2020-04-15 NOTE — Telephone Encounter (Signed)
Copied from [place] [phone]. Topic: General - Other >> Apr 15, 2020  2:26 PM Leward Quan A wrote: Reason for CRM: Colletta Maryland with [facility] called to inquire of Dr Caryn Section the exact type of air mattress needed for this patient whether it is a Low air pressure or alternating pressure mattress.Also asking  Dr Caryn Section to please provide office visit notes. Colletta Maryland can be reached at Ph# [phone] ext# [phone]

## 2020-04-15 NOTE — Telephone Encounter (Signed)
The order for air mattress was faxed to [facility] on 04/12/2020. It was sent to [facility] per pt's request in the telephone encounter started on 04/06/2020. TNP

## 2020-04-15 NOTE — Chronic Care Management (AMB) (Signed)
°  Chronic Care Management   Note  04/15/2020 Name: Tina Salinas MRN: [id number] DOB: 01/30/1946    Brief outreach with Ms. Tina Salinas. Per chart review, she was hospitalized from 03/31/20 to 04/02/20 for non-ST elevated myocardial infarction (NSTEMI). She did not consent to ongoing care management services today but requested that I follow up regarding home health and medical supplies. Reports needing an air mattress due to being bedbound and having multiple bedsores on her buttocks. Reports having the bedsores since June. States they have become more painful over the past few weeks. Also requesting home health services with [facility]. She reports having three private pay nursing assistants that help during the day. Attempted to discuss level of care needs and concerns regarding her safety when the nursing assistants are not available. She confirmed declining recommended skilled nursing and rehabilitation services. We discussed the notes regarding the declined referrals from home health agencies due to concerns that she requires 24hr assistance. She acknowledged being informed of this prior to hospital discharge. She is adamant that she does not require a higher level of care and requested not to discuss it again.  Attempted outreach with [facility].  Left a message requesting a return call. I was able to speak with a customer service representative at [facility]. Confirmed receiving the request for an air mattress but indicated that a copy of Ms. Cravens office visit/narrative is also required.    PLAN Will update Ms. Cravens primary care provider and follow up within the next week.   [facility] [phone]

## 2020-04-16 ENCOUNTER — Telehealth: Payer: Self-pay

## 2020-04-16 NOTE — Telephone Encounter (Signed)
Then she will need to be see in the office.

## 2020-04-16 NOTE — Telephone Encounter (Signed)
Copied from [place] [phone]. Topic: General - Other >> Apr 16, 2020 10:25 AM Greggory Keen D wrote: Reason for CRM: Dewana with [facility] called to say they were going tomorrow to resume home heath care for patient This is just an [place]

## 2020-04-16 NOTE — Telephone Encounter (Signed)
I called [facility] and spoke with [facility]. I advised her as below. Colletta Maryland says that [employer] cover a low pressure mattress unless there are sores or wounds present. She states she would need office notes or documentation that mention that patient has bed sores. Colletta Maryland says some insurance require specific details of the bedsores such as the depth of the sores.  I faxed over a copy of recent hospital notes to [facility]. Fax # [phone].

## 2020-04-16 NOTE — Telephone Encounter (Signed)
FYI

## 2020-04-17 DIAGNOSIS — I130 Hypertensive heart and chronic kidney disease with heart failure and stage 1 through stage 4 chronic kidney disease, or unspecified chronic kidney disease: Secondary | ICD-10-CM | POA: Diagnosis not present

## 2020-04-17 DIAGNOSIS — I739 Peripheral vascular disease, unspecified: Secondary | ICD-10-CM | POA: Diagnosis not present

## 2020-04-17 DIAGNOSIS — M05741 Rheumatoid arthritis with rheumatoid factor of right hand without organ or systems involvement: Secondary | ICD-10-CM | POA: Diagnosis not present

## 2020-04-17 DIAGNOSIS — G8929 Other chronic pain: Secondary | ICD-10-CM | POA: Diagnosis not present

## 2020-04-17 DIAGNOSIS — N183 Chronic kidney disease, stage 3 unspecified: Secondary | ICD-10-CM | POA: Diagnosis not present

## 2020-04-17 DIAGNOSIS — M05471 Rheumatoid myopathy with rheumatoid arthritis of right ankle and foot: Secondary | ICD-10-CM | POA: Diagnosis not present

## 2020-04-17 DIAGNOSIS — I5042 Chronic combined systolic (congestive) and diastolic (congestive) heart failure: Secondary | ICD-10-CM | POA: Diagnosis not present

## 2020-04-17 DIAGNOSIS — M158 Other polyosteoarthritis: Secondary | ICD-10-CM | POA: Diagnosis not present

## 2020-04-17 DIAGNOSIS — G809 Cerebral palsy, unspecified: Secondary | ICD-10-CM | POA: Diagnosis not present

## 2020-04-19 ENCOUNTER — Telehealth: Payer: Self-pay

## 2020-04-19 NOTE — Telephone Encounter (Signed)
Copied from [place] [id number]. Topic: General - Other >> Apr 19, 2020 12:22 PM Keene Breath wrote: Reason for CRM: Patient called to ask the nurse to call regarding her home health appt.  She stated that they were supposed to come today with her new bed, but she has not heard from anyone yet.  Please advise and call patient to discuss at [phone]

## 2020-04-20 ENCOUNTER — Telehealth: Payer: Self-pay

## 2020-04-20 DIAGNOSIS — M158 Other polyosteoarthritis: Secondary | ICD-10-CM | POA: Diagnosis not present

## 2020-04-20 DIAGNOSIS — G8929 Other chronic pain: Secondary | ICD-10-CM | POA: Diagnosis not present

## 2020-04-20 DIAGNOSIS — I5042 Chronic combined systolic (congestive) and diastolic (congestive) heart failure: Secondary | ICD-10-CM | POA: Diagnosis not present

## 2020-04-20 DIAGNOSIS — M05471 Rheumatoid myopathy with rheumatoid arthritis of right ankle and foot: Secondary | ICD-10-CM | POA: Diagnosis not present

## 2020-04-20 DIAGNOSIS — N183 Chronic kidney disease, stage 3 unspecified: Secondary | ICD-10-CM | POA: Diagnosis not present

## 2020-04-20 DIAGNOSIS — I130 Hypertensive heart and chronic kidney disease with heart failure and stage 1 through stage 4 chronic kidney disease, or unspecified chronic kidney disease: Secondary | ICD-10-CM | POA: Diagnosis not present

## 2020-04-20 DIAGNOSIS — M05741 Rheumatoid arthritis with rheumatoid factor of right hand without organ or systems involvement: Secondary | ICD-10-CM | POA: Diagnosis not present

## 2020-04-20 DIAGNOSIS — G809 Cerebral palsy, unspecified: Secondary | ICD-10-CM | POA: Diagnosis not present

## 2020-04-20 DIAGNOSIS — I739 Peripheral vascular disease, unspecified: Secondary | ICD-10-CM | POA: Diagnosis not present

## 2020-04-20 NOTE — Telephone Encounter (Signed)
Patient friend Norma Fredrickson (on [place]) is asking if is possible that patient be refer to a wound clinic. Reports that patient's wound is not any better. She is aware that DSS is involved and that patient probably is going to a nursing home but would like her to be refer to a wound clinic please.

## 2020-04-21 ENCOUNTER — Telehealth: Payer: Self-pay

## 2020-04-21 ENCOUNTER — Inpatient Hospital Stay
Admission: EM | Admit: 2020-04-21 | Discharge: 2020-05-10 | DRG: 853 | Disposition: A | Discharge status: Hospice | Payer: Medicare HMO | Attending: Internal Medicine | Admitting: Internal Medicine

## 2020-04-21 ENCOUNTER — Ambulatory Visit: Payer: Self-pay

## 2020-04-21 ENCOUNTER — Emergency Department: Payer: Medicare HMO

## 2020-04-21 DIAGNOSIS — E43 Unspecified severe protein-calorie malnutrition: Secondary | ICD-10-CM | POA: Diagnosis present

## 2020-04-21 DIAGNOSIS — N390 Urinary tract infection, site not specified: Secondary | ICD-10-CM | POA: Diagnosis not present

## 2020-04-21 DIAGNOSIS — R531 Weakness: Secondary | ICD-10-CM

## 2020-04-21 DIAGNOSIS — N183 Chronic kidney disease, stage 3 unspecified: Secondary | ICD-10-CM | POA: Diagnosis not present

## 2020-04-21 DIAGNOSIS — E860 Dehydration: Secondary | ICD-10-CM | POA: Diagnosis present

## 2020-04-21 DIAGNOSIS — I130 Hypertensive heart and chronic kidney disease with heart failure and stage 1 through stage 4 chronic kidney disease, or unspecified chronic kidney disease: Secondary | ICD-10-CM | POA: Diagnosis not present

## 2020-04-21 DIAGNOSIS — D75839 Thrombocytosis, unspecified: Secondary | ICD-10-CM | POA: Diagnosis present

## 2020-04-21 DIAGNOSIS — D638 Anemia in other chronic diseases classified elsewhere: Secondary | ICD-10-CM | POA: Diagnosis present

## 2020-04-21 DIAGNOSIS — Z681 Body mass index (BMI) 19 or less, adult: Secondary | ICD-10-CM | POA: Diagnosis not present

## 2020-04-21 DIAGNOSIS — F4321 Adjustment disorder with depressed mood: Secondary | ICD-10-CM | POA: Diagnosis present

## 2020-04-21 DIAGNOSIS — I5043 Acute on chronic combined systolic (congestive) and diastolic (congestive) heart failure: Secondary | ICD-10-CM | POA: Diagnosis present

## 2020-04-21 DIAGNOSIS — I110 Hypertensive heart disease with heart failure: Secondary | ICD-10-CM | POA: Diagnosis present

## 2020-04-21 DIAGNOSIS — E785 Hyperlipidemia, unspecified: Secondary | ICD-10-CM | POA: Diagnosis present

## 2020-04-21 DIAGNOSIS — R451 Restlessness and agitation: Secondary | ICD-10-CM | POA: Diagnosis present

## 2020-04-21 DIAGNOSIS — E872 Acidosis: Secondary | ICD-10-CM | POA: Diagnosis present

## 2020-04-21 DIAGNOSIS — Z66 Do not resuscitate: Secondary | ICD-10-CM | POA: Diagnosis not present

## 2020-04-21 DIAGNOSIS — A4152 Sepsis due to Pseudomonas: Principal | ICD-10-CM | POA: Diagnosis present

## 2020-04-21 DIAGNOSIS — R1312 Dysphagia, oropharyngeal phase: Secondary | ICD-10-CM | POA: Diagnosis present

## 2020-04-21 DIAGNOSIS — Z95828 Presence of other vascular implants and grafts: Secondary | ICD-10-CM | POA: Diagnosis not present

## 2020-04-21 DIAGNOSIS — Z792 Long term (current) use of antibiotics: Secondary | ICD-10-CM

## 2020-04-21 DIAGNOSIS — Z955 Presence of coronary angioplasty implant and graft: Secondary | ICD-10-CM

## 2020-04-21 DIAGNOSIS — Z602 Problems related to living alone: Secondary | ICD-10-CM | POA: Diagnosis present

## 2020-04-21 DIAGNOSIS — Z7401 Bed confinement status: Secondary | ICD-10-CM

## 2020-04-21 DIAGNOSIS — L89150 Pressure ulcer of sacral region, unstageable: Secondary | ICD-10-CM | POA: Diagnosis not present

## 2020-04-21 DIAGNOSIS — Z20822 Contact with and (suspected) exposure to covid-19: Secondary | ICD-10-CM | POA: Diagnosis present

## 2020-04-21 DIAGNOSIS — Z452 Encounter for adjustment and management of vascular access device: Secondary | ICD-10-CM | POA: Diagnosis not present

## 2020-04-21 DIAGNOSIS — M810 Age-related osteoporosis without current pathological fracture: Secondary | ICD-10-CM | POA: Diagnosis present

## 2020-04-21 DIAGNOSIS — I82A11 Acute embolism and thrombosis of right axillary vein: Secondary | ICD-10-CM | POA: Diagnosis not present

## 2020-04-21 DIAGNOSIS — G809 Cerebral palsy, unspecified: Secondary | ICD-10-CM | POA: Diagnosis not present

## 2020-04-21 DIAGNOSIS — I82621 Acute embolism and thrombosis of deep veins of right upper extremity: Secondary | ICD-10-CM | POA: Diagnosis not present

## 2020-04-21 DIAGNOSIS — N1831 Chronic kidney disease, stage 3a: Secondary | ICD-10-CM | POA: Diagnosis not present

## 2020-04-21 DIAGNOSIS — I480 Paroxysmal atrial fibrillation: Secondary | ICD-10-CM | POA: Diagnosis present

## 2020-04-21 DIAGNOSIS — A419 Sepsis, unspecified organism: Secondary | ICD-10-CM | POA: Diagnosis present

## 2020-04-21 DIAGNOSIS — L8990 Pressure ulcer of unspecified site, unspecified stage: Secondary | ICD-10-CM | POA: Diagnosis present

## 2020-04-21 DIAGNOSIS — E440 Moderate protein-calorie malnutrition: Secondary | ICD-10-CM | POA: Diagnosis present

## 2020-04-21 DIAGNOSIS — M069 Rheumatoid arthritis, unspecified: Secondary | ICD-10-CM | POA: Diagnosis present

## 2020-04-21 DIAGNOSIS — Z79899 Other long term (current) drug therapy: Secondary | ICD-10-CM

## 2020-04-21 DIAGNOSIS — R54 Age-related physical debility: Secondary | ICD-10-CM | POA: Diagnosis present

## 2020-04-21 DIAGNOSIS — I82611 Acute embolism and thrombosis of superficial veins of right upper extremity: Secondary | ICD-10-CM | POA: Diagnosis not present

## 2020-04-21 DIAGNOSIS — J9601 Acute respiratory failure with hypoxia: Secondary | ICD-10-CM | POA: Diagnosis not present

## 2020-04-21 DIAGNOSIS — Z7952 Long term (current) use of systemic steroids: Secondary | ICD-10-CM

## 2020-04-21 DIAGNOSIS — E876 Hypokalemia: Secondary | ICD-10-CM | POA: Diagnosis not present

## 2020-04-21 DIAGNOSIS — L89153 Pressure ulcer of sacral region, stage 3: Secondary | ICD-10-CM | POA: Diagnosis present

## 2020-04-21 DIAGNOSIS — I5042 Chronic combined systolic (congestive) and diastolic (congestive) heart failure: Secondary | ICD-10-CM | POA: Diagnosis not present

## 2020-04-21 DIAGNOSIS — R197 Diarrhea, unspecified: Secondary | ICD-10-CM | POA: Diagnosis present

## 2020-04-21 DIAGNOSIS — R627 Adult failure to thrive: Secondary | ICD-10-CM | POA: Diagnosis present

## 2020-04-21 DIAGNOSIS — Z515 Encounter for palliative care: Secondary | ICD-10-CM | POA: Diagnosis not present

## 2020-04-21 DIAGNOSIS — Z7189 Other specified counseling: Secondary | ICD-10-CM

## 2020-04-21 DIAGNOSIS — R52 Pain, unspecified: Secondary | ICD-10-CM | POA: Diagnosis not present

## 2020-04-21 DIAGNOSIS — J90 Pleural effusion, not elsewhere classified: Secondary | ICD-10-CM | POA: Diagnosis not present

## 2020-04-21 DIAGNOSIS — J449 Chronic obstructive pulmonary disease, unspecified: Secondary | ICD-10-CM | POA: Diagnosis not present

## 2020-04-21 DIAGNOSIS — I82B11 Acute embolism and thrombosis of right subclavian vein: Secondary | ICD-10-CM | POA: Diagnosis not present

## 2020-04-21 DIAGNOSIS — I10 Essential (primary) hypertension: Secondary | ICD-10-CM | POA: Diagnosis not present

## 2020-04-21 DIAGNOSIS — R6520 Severe sepsis without septic shock: Secondary | ICD-10-CM | POA: Diagnosis not present

## 2020-04-21 DIAGNOSIS — Z96652 Presence of left artificial knee joint: Secondary | ICD-10-CM | POA: Diagnosis present

## 2020-04-21 DIAGNOSIS — I2510 Atherosclerotic heart disease of native coronary artery without angina pectoris: Secondary | ICD-10-CM | POA: Diagnosis present

## 2020-04-21 DIAGNOSIS — Z7983 Long term (current) use of bisphosphonates: Secondary | ICD-10-CM

## 2020-04-21 DIAGNOSIS — E039 Hypothyroidism, unspecified: Secondary | ICD-10-CM | POA: Diagnosis not present

## 2020-04-21 DIAGNOSIS — I959 Hypotension, unspecified: Secondary | ICD-10-CM | POA: Diagnosis not present

## 2020-04-21 DIAGNOSIS — N3091 Cystitis, unspecified with hematuria: Secondary | ICD-10-CM | POA: Diagnosis present

## 2020-04-21 DIAGNOSIS — Z7982 Long term (current) use of aspirin: Secondary | ICD-10-CM

## 2020-04-21 LAB — CBC WITH DIFFERENTIAL/PLATELET
Abs Immature Granulocytes: 0.07 10*3/uL (ref 0.00–0.07)
Basophils Absolute: 0 10*3/uL (ref 0.0–0.1)
Basophils Relative: 1 %
Eosinophils Absolute: 0.1 10*3/uL (ref 0.0–0.5)
Eosinophils Relative: 1 %
HCT: 33.8 % — ABNORMAL LOW (ref 36.0–46.0)
Hemoglobin: 10.4 g/dL — ABNORMAL LOW (ref 12.0–15.0)
Immature Granulocytes: 1 %
Lymphocytes Relative: 13 %
Lymphs Abs: 1 10*3/uL (ref 0.7–4.0)
MCH: 27.7 pg (ref 26.0–34.0)
MCHC: 30.8 g/dL (ref 30.0–36.0)
MCV: 90.1 fL (ref 80.0–100.0)
Monocytes Absolute: 0.2 10*3/uL (ref 0.1–1.0)
Monocytes Relative: 2 %
Neutro Abs: 6.5 10*3/uL (ref 1.7–7.7)
Neutrophils Relative %: 82 %
Platelets: 421 10*3/uL — ABNORMAL HIGH (ref 150–400)
RBC: 3.75 MIL/uL — ABNORMAL LOW (ref 3.87–5.11)
RDW: 17.4 % — ABNORMAL HIGH (ref 11.5–15.5)
WBC: 7.9 10*3/uL (ref 4.0–10.5)
nRBC: 0 % (ref 0.0–0.2)

## 2020-04-21 LAB — URINALYSIS, COMPLETE (UACMP) WITH MICROSCOPIC
Bacteria, UA: NONE SEEN
Bilirubin Urine: NEGATIVE
Glucose, UA: NEGATIVE mg/dL
Ketones, ur: NEGATIVE mg/dL
Nitrite: NEGATIVE
Protein, ur: 30 mg/dL — AB
Specific Gravity, Urine: 1.028 (ref 1.005–1.030)
WBC, UA: >50 WBC/hpf — ABNORMAL HIGH (ref 0–5)
pH: 5 (ref 5.0–8.0)

## 2020-04-21 LAB — COMPREHENSIVE METABOLIC PANEL
ALT: 18 U/L (ref 0–44)
AST: 45 U/L — ABNORMAL HIGH (ref 15–41)
Albumin: 2.2 g/dL — ABNORMAL LOW (ref 3.5–5.0)
Alkaline Phosphatase: 63 U/L (ref 38–126)
Anion gap: 9 (ref 5–15)
BUN: 14 mg/dL (ref 8–23)
CO2: 22 mmol/L (ref 22–32)
Calcium: 7.3 mg/dL — ABNORMAL LOW (ref 8.9–10.3)
Chloride: 108 mmol/L (ref 98–111)
Creatinine, Ser: 0.78 mg/dL (ref 0.44–1.00)
GFR calc non Af Amer: >60 mL/min (ref >60)
Glucose, Bld: 125 mg/dL — ABNORMAL HIGH (ref 70–99)
Potassium: 3.4 mmol/L — ABNORMAL LOW (ref 3.5–5.1)
Sodium: 139 mmol/L (ref 135–145)
Total Bilirubin: 0.6 mg/dL (ref 0.3–1.2)
Total Protein: 5.8 g/dL — ABNORMAL LOW (ref 6.5–8.1)

## 2020-04-21 LAB — LACTIC ACID, PLASMA
Lactic Acid, Venous: 2 mmol/L (ref 0.5–1.9)
Lactic Acid, Venous: 0.8 mmol/L (ref 0.5–1.9)

## 2020-04-21 MED ORDER — HYDROCODONE-ACETAMINOPHEN 5-325 MG PO TABS
1.0000 | ORAL_TABLET | Freq: Once | ORAL | Status: AC
  Administered 2020-04-21: 1 via ORAL
  Filled 2020-04-21: qty 1

## 2020-04-21 MED ORDER — SODIUM CHLORIDE 0.9 % IV BOLUS
1000.0000 mL | Freq: Once | INTRAVENOUS | Status: AC
  Administered 2020-04-21: 1000 mL via INTRAVENOUS

## 2020-04-21 MED ORDER — CEPHALEXIN 250 MG PO CAPS
250.0000 mg | ORAL_CAPSULE | Freq: Three times a day (TID) | ORAL | Status: DC
  Administered 2020-04-22 – 2020-04-26 (×12): 250 mg via ORAL
  Filled 2020-04-21 (×13): qty 1

## 2020-04-21 NOTE — Chronic Care Management (AMB) (Signed)
°  Chronic Care Management   Note  04/21/2020 Name: Tina Salinas MRN: [id number] DOB: 08-20-1945   Attempted to  follow-up regarding home health services and requested air mattress. Per chart review, Tina Salinas is currently being evaluated at [facility] [phone]
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
## 2020-04-21 NOTE — Telephone Encounter (Signed)
Copied from [place] [id number]. Topic: Quick Communication - See Telephone Encounter >> Apr 21, 2020 12:15 PM Loma [place] wrote: CRM for notification. See Telephone encounter for: 04/21/20. Colletta Maryland from [facility] called when doing a check, EMS was at the home and taking pt to [facility], FU with pt emergency contact, Sherril Croon [phone] told unsafe environment and that pt would probably be placed in skilled nursing/multiple bedsores

## 2020-04-21 NOTE — ED Triage Notes (Signed)
Patient under APS custody, coming from home. Increasing pain to bed sores. Large decubitus sores to buttocks, developing sores to bilateral heels. Bedbound. Patient A&ox3

## 2020-04-21 NOTE — Evaluation (Addendum)
Physical Therapy Evaluation Patient Details Name: Tina Salinas MRN: [id number] DOB: [birth year] Today's Date: 04/21/2020   History of Present Illness  presented to ER secondary to progressive SOB, chest pain; admitted for management of NSTEMI  Clinical Impression  Patient resting on stretcher upon arrival to room; easily awakens to voice.  Alert and oriented; follows commands.  Generally very self-directive of care. Reports significant pain to bilat LEs; very fearful/hesitant for any movement of LEs throughout session.  Globally weak and deconditioned throughout head/neck, trunk and extremities.  Maintains/prefers semi-fetal position with torso rotation towards R; noted torticollis associated with position.  Tolerates UE shoulder elevation to approx 70-80 degrees, limited by chronic arthritic changes.  Bilat LEs with profound weakness, trace movement knees distally at best. Maintains L LE in position of significant IR/adduct; LEs almost crossed at knees at rest. Grimacing/moaning with any attempt at passive ROM, requiring increased time/encouragement for attempts at movement and repositioning.  Areas of skin redness noted to bilat heels; floated and repositioned end of session.  Patient unable to tolerate additional mobility at this time due to pain; will attempt coordination with pain meds in subsequent sessions to allow for maximize mobility tolerance and participation. Would benefit from skilled PT to address above deficits and promote optimal return to PLOF.; recommend transition to STR upon discharge from acute hospitalization, pending ability to actively participate/progress with skilled services. May benefit from transition to LTC for long-term care needs post rehab trial/course.    Follow Up Recommendations STR (may benefit from transition to LTC for long-term care needs post rehab trial/course)    Equipment Recommendations       Recommendations for Other Services       Precautions /  Restrictions Precautions Precautions: Fall Precaution Comments: Stage III sacral wound Restrictions Weight Bearing Restrictions: No      Mobility  Bed Mobility Overal bed mobility: Needs Assistance Bed Mobility: Supine to Sit     Supine to sit: Total assist     General bed mobility comments: very limited tolerance for movement/repositioning  Transfers                 General transfer comment: unsafe/unable at this time  Ambulation/Gait             General Gait Details: unsafe/unable at this time  Stairs            Wheelchair Mobility    Modified Rankin (Stroke Patients Only)       Balance                                             Pertinent Vitals/Pain Pain Assessment: Faces Faces Pain Scale: Hurts even more Pain Location: bilat LEs Pain Descriptors / Indicators: Aching;Grimacing;Guarding Pain Intervention(s): Limited activity within patient's tolerance;Monitored during session;Repositioned    Home Living Family/patient expects to be discharged to:: Private residence Living Arrangements: Alone Available Help at Discharge: Friend(s);Available PRN/intermittently Type of Home: House Home Access: Ramped entrance     Home Layout: One level Home Equipment: Walker - 2 wheels      Prior Function Level of Independence: Needs assistance         Comments: At baseline, mod indep for ADLs, household and limited community mobilization, driving (up until March, 2021).  Since March, has experienced progressive decline to the point of being largely bed/WC bound (reports using [facility] for  toileting needs)     Hand Dominance        Extremity/Trunk Assessment   Upper Extremity Assessment Upper Extremity Assessment: Generalized weakness (shoulder elevation to 70-80 degrees only; elbow, wrist and hand grossly WFL.  Generally weak, at least 3/5)    Lower Extremity Assessment Lower Extremity Assessment: Generalized weakness  (globally weak and deconditioned, very trace active movement noted/tolerated.  L LE maintained in position of IR/adduct.  Ankles lacking full DF.  Very poor tolerance for passive ROM bilat LEs.  Pressure areas to bilat heels)       Communication   Communication: No difficulties  Cognition Arousal/Alertness: Awake/alert Behavior During Therapy: WFL for tasks assessed/performed Overall Cognitive Status: Within Functional Limits for tasks assessed                                        General Comments      Exercises Other Exercises Other Exercises: Educated in role of PT/OT, positioning/pressure relief, neutral alignment, progressive mobility as tolerated; emphasized importance of participation as able to maximize progress and benefit to therapy services.  Patietn voiced understanding and agreement. Other Exercises: Prefers position of R sidelying, partial-fetal position; noted torticollis.  Unable/unwilling to progress towards neutral this date   Assessment/Plan    PT Assessment Patient needs continued PT services  PT Problem List Decreased strength;Decreased range of motion;Decreased activity tolerance;Decreased balance;Decreased mobility;Decreased knowledge of use of DME;Decreased safety awareness;Decreased knowledge of precautions;Decreased skin integrity;Pain       PT Treatment Interventions DME instruction;Gait training;Functional mobility training;Therapeutic activities;Patient/family education;Therapeutic exercise;Balance training;Cognitive remediation    PT Goals (Current goals can be found in the Care Plan section)  Acute Rehab PT Goals Patient Stated Goal: to get on my feet again PT Goal Formulation: With patient Time For Goal Achievement: 05/05/20 Potential to Achieve Goals: Fair    Frequency Min 2X/week   Barriers to discharge Decreased caregiver support      Co-evaluation               AM-PAC PT "6 Clicks" Mobility  Outcome Measure Help  needed turning from your back to your side while in a flat bed without using bedrails?: Total Help needed moving from lying on your back to sitting on the side of a flat bed without using bedrails?: Total Help needed moving to and from a bed to a chair (including a wheelchair)?: Total Help needed standing up from a chair using your arms (e.g., wheelchair or bedside chair)?: Total Help needed to walk in hospital room?: Total Help needed climbing 3-5 steps with a railing? : Total 6 Click Score: 6    End of Session   Activity Tolerance: Patient limited by pain Patient left: in bed;with call bell/phone within reach   PT Visit Diagnosis: Muscle weakness (generalized) (M62.81);Pain    Time: 1546-1620 PT Time Calculation (min) (ACUTE ONLY): 34 min   Charges:   PT Evaluation $PT Eval Moderate Complexity: 1 Mod          Shirlette Scarber H. Owens Shark, PT, DPT, NCS 04/21/20, 5:16 PM [phone]

## 2020-04-21 NOTE — ED Notes (Signed)
x1 blood cultures sent to lab.  

## 2020-04-21 NOTE — ED Provider Notes (Signed)
-----------------------------------------   11:44 PM on 04/21/2020 -----------------------------------------  Keflex ordered for UTI.  Urine culture ordered.  Will ask pharmacy tech to reconcile patient's home medications and will order in the computer.   ----------------------------------------- 6:04 AM on 04/22/2020 -----------------------------------------  Patient's home medications were ordered.  No events overnight.  Awaiting social work evaluation for placement.   Paulette Blanch, MD 04/22/20 [phone]

## 2020-04-21 NOTE — ED Notes (Signed)
PT at bedside.

## 2020-04-21 NOTE — Telephone Encounter (Signed)
They should have come out by now, we received request for all the home health orders.

## 2020-04-21 NOTE — ED Notes (Signed)
Patient repositioned in bed, pillows placed under buttocks bilaterally and under knees, patient sacrum cleansed and mepiplex applied to wounds, patient in pain while being repositioned but states she is more comfortable with additional pillows, patients HOB elevated and assisted with eating cereal and milk, patient ate and drank beverage, patient call light within reach and she is resting comfortably, updated on plan of care.
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
## 2020-04-21 NOTE — Telephone Encounter (Signed)
Patient hospitalized today.

## 2020-04-22 DIAGNOSIS — J449 Chronic obstructive pulmonary disease, unspecified: Secondary | ICD-10-CM | POA: Diagnosis not present

## 2020-04-22 LAB — URINE CULTURE

## 2020-04-22 MED ORDER — HYDROXYCHLOROQUINE SULFATE 200 MG PO TABS
400.0000 mg | ORAL_TABLET | Freq: Every day | ORAL | Status: DC
  Administered 2020-04-22 – 2020-05-10 (×12): 400 mg via ORAL
  Filled 2020-04-22 (×19): qty 2

## 2020-04-22 MED ORDER — MIRTAZAPINE 15 MG PO TABS
30.0000 mg | ORAL_TABLET | Freq: Every day | ORAL | Status: DC
  Administered 2020-04-22: 30 mg via ORAL
  Filled 2020-04-22 (×2): qty 2

## 2020-04-22 MED ORDER — CARVEDILOL 6.25 MG PO TABS
3.1250 mg | ORAL_TABLET | Freq: Two times a day (BID) | ORAL | Status: DC
  Administered 2020-04-22 – 2020-04-26 (×7): 3.125 mg via ORAL
  Filled 2020-04-22 (×7): qty 1

## 2020-04-22 MED ORDER — ALENDRONATE SODIUM 70 MG PO TABS: 70.0000 mg | ORAL_TABLET | ORAL | Status: DC

## 2020-04-22 MED ORDER — PRAVASTATIN SODIUM 20 MG PO TABS
40.0000 mg | ORAL_TABLET | Freq: Every day | ORAL | Status: DC
  Administered 2020-04-22 – 2020-05-06 (×13): 40 mg via ORAL
  Filled 2020-04-22: qty 1
  Filled 2020-04-22: qty 2
  Filled 2020-04-22: qty 1
  Filled 2020-04-22 (×2): qty 2
  Filled 2020-04-22: qty 1
  Filled 2020-04-22: qty 2
  Filled 2020-04-22 (×2): qty 1
  Filled 2020-04-22 (×4): qty 2
  Filled 2020-04-22: qty 1
  Filled 2020-04-22: qty 2

## 2020-04-22 MED ORDER — DAKINS (1/4 STRENGTH) 0.125 % EX SOLN
Freq: Every day | CUTANEOUS | Status: DC
  Filled 2020-04-22 (×2): qty 473

## 2020-04-22 MED ORDER — LATANOPROST 0.005 % OP SOLN
1.0000 [drp] | Freq: Every day | OPHTHALMIC | Status: DC
  Administered 2020-04-22 – 2020-05-09 (×17): 1 [drp] via OPHTHALMIC
  Filled 2020-04-22: qty 2.5

## 2020-04-22 MED ORDER — DOCUSATE SODIUM 100 MG PO CAPS
100.0000 mg | ORAL_CAPSULE | Freq: Every day | ORAL | Status: DC
  Administered 2020-04-22 – 2020-05-01 (×7): 100 mg via ORAL
  Filled 2020-04-22 (×9): qty 1

## 2020-04-22 MED ORDER — PREDNISONE 2.5 MG PO TABS
2.5000 mg | ORAL_TABLET | Freq: Every day | ORAL | Status: DC
  Administered 2020-04-22 – 2020-05-07 (×12): 2.5 mg via ORAL
  Filled 2020-04-22 (×20): qty 1

## 2020-04-22 MED ORDER — AMIODARONE HCL 200 MG PO TABS
200.0000 mg | ORAL_TABLET | Freq: Every day | ORAL | Status: DC
  Administered 2020-04-22 – 2020-05-07 (×13): 200 mg via ORAL
  Filled 2020-04-22 (×15): qty 1

## 2020-04-22 MED ORDER — ASPIRIN EC 81 MG PO TBEC
81.0000 mg | DELAYED_RELEASE_TABLET | Freq: Every day | ORAL | Status: DC
  Administered 2020-04-22 – 2020-05-05 (×12): 81 mg via ORAL
  Filled 2020-04-22 (×14): qty 1

## 2020-04-22 MED ORDER — FE FUMARATE-B12-VIT C-FA-IFC PO CAPS
1.0000 | ORAL_CAPSULE | Freq: Two times a day (BID) | ORAL | Status: DC
  Administered 2020-04-22 – 2020-05-07 (×25): 1 via ORAL
  Filled 2020-04-22 (×33): qty 1

## 2020-04-22 MED ORDER — VITAMIN D 25 MCG (1000 UNIT) PO TABS
1000.0000 [IU] | ORAL_TABLET | Freq: Every day | ORAL | Status: DC
  Administered 2020-04-22 – 2020-05-07 (×11): 1000 [IU] via ORAL
  Filled 2020-04-22 (×12): qty 1

## 2020-04-22 MED ORDER — CALCIUM CARBONATE-VITAMIN D 500-200 MG-UNIT PO TABS
2.0000 | ORAL_TABLET | Freq: Every day | ORAL | Status: DC
  Administered 2020-04-22 – 2020-05-07 (×12): 2 via ORAL
  Filled 2020-04-22 (×13): qty 2

## 2020-04-22 MED ORDER — ISOSORBIDE MONONITRATE ER 60 MG PO TB24
30.0000 mg | ORAL_TABLET | Freq: Every day | ORAL | Status: DC
  Administered 2020-04-22 – 2020-04-26 (×3): 30 mg via ORAL
  Filled 2020-04-22 (×4): qty 1

## 2020-04-22 NOTE — Consult Note (Signed)
WOC Nurse Consult Note: Reason for Consult:Unstageable pressure injury to sacrum.  Assessment is limited due to patient pain.   Wound type:unstageable pressure injury Pressure Injury POA: Yes Measurement: 14 cm x 6.2 cm wound bed is 100% devitalized tissue.  Wound bed:see above  Needs debridement.  Drainage (amount, consistency, odor) moderate purulence foul sour odor Periwound: intact Dressing procedure/placement/frequency: cleanse sacral wound with NS and pat dry.  Apply Dakins moist gauze to wound bed   Cover with dry dressing and ABD pad. Will continue through weekend. Re-assess Monday.  04/26/20. Will follow.  Domenic Moras MSN, RN, FNP-BC CWON Wound, Ostomy, Continence Nurse Pager [phone]

## 2020-04-22 NOTE — ED Notes (Signed)
Pt given apple sauce

## 2020-04-22 NOTE — ED Notes (Signed)
Pt given water at thsi time

## 2020-04-22 NOTE — ED Notes (Signed)
Pt placed on hospital bed

## 2020-04-22 NOTE — ED Notes (Signed)
Pt assisted with changing positions in the bed. Pt did not tolerate this well and states "It hurts to move".

## 2020-04-22 NOTE — ED Notes (Signed)
Pt given water at this time 

## 2020-04-22 NOTE — ED Notes (Signed)
TV turned on for patient at this time, lights dimmed for patient comfort. This RN explained waiting for patient's lunch tray to arrive at this time. Pt remains alert, head lifted up at this time for patient to watch TV.

## 2020-04-22 NOTE — ED Notes (Signed)
Dietary called and breakfast tray ordered.

## 2020-04-22 NOTE — ED Notes (Signed)
SW -> SNF    Lucrezia Starch, MD 04/22/20 [phone]

## 2020-04-22 NOTE — ED Notes (Signed)
This RN assisted patient with meal tray, then repositioned patient onto R side. Pt tolerated fairly. Pt continues to rest in bed watching TV.

## 2020-04-22 NOTE — ED Notes (Signed)
Message sent to pharmacy requesting missing medications to be sent for the patient.

## 2020-04-22 NOTE — ED Notes (Signed)
Pt declined breakfast tray at this time.

## 2020-04-22 NOTE — ED Notes (Signed)
Patient turned onto her back at this time.  Pt tolerated well but still reports pain with movement.

## 2020-04-23 ENCOUNTER — Other Ambulatory Visit: Payer: Self-pay

## 2020-04-23 ENCOUNTER — Telehealth: Payer: Self-pay

## 2020-04-23 MED ORDER — TIZANIDINE HCL 2 MG PO TABS
2.0000 mg | ORAL_TABLET | Freq: Once | ORAL | Status: AC
  Administered 2020-04-23: 2 mg via ORAL
  Filled 2020-04-23: qty 1

## 2020-04-23 MED ORDER — OXYCODONE-ACETAMINOPHEN 5-325 MG PO TABS
1.0000 | ORAL_TABLET | Freq: Once | ORAL | Status: AC
  Administered 2020-04-23: 1 via ORAL
  Filled 2020-04-23: qty 1

## 2020-04-23 MED ORDER — HYDROCODONE-ACETAMINOPHEN 5-325 MG PO TABS
1.0000 | ORAL_TABLET | Freq: Once | ORAL | Status: AC
  Administered 2020-04-23: 1 via ORAL
  Filled 2020-04-23: qty 1

## 2020-04-23 MED ORDER — ASPIRIN EC 325 MG PO TBEC
325.0000 mg | DELAYED_RELEASE_TABLET | Freq: Once | ORAL | Status: AC
  Administered 2020-04-23: 325 mg via ORAL
  Filled 2020-04-23: qty 1

## 2020-04-23 NOTE — ED Notes (Signed)
Pt given warm blankets.
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
## 2020-04-23 NOTE — ED Notes (Signed)
Removed boot per patient request. Explained necessity.Patient declined. Alert and oriented. Demanded boot be removed. Declined pillow instead.

## 2020-04-23 NOTE — Telephone Encounter (Signed)
[facility] has received a request for a low air loss mattress however her insurance will not cover this because she doesn't have state 4 pressure ulcers.   She would qualify for an alternating pressure pump and pad.  If you will write a new order for this and fax it to [phone].  The office notes they have on her will be suffice to cover the pressure pump/pad.

## 2020-04-23 NOTE — TOC Progression Note (Signed)
Transition of Care [facility]) - Progression Note    Patient Details  Name: Tina Salinas MRN: [id number] Date of Birth: January 17, 1946  Transition of Care [facility]) CM/SW [place], [place] Phone Number: [phone] 04/23/2020, 12:33 PM  Clinical Narrative:     CSW spoke with Ebony Hail. Ebony Hail would like pt Medicaid application to get started for LTC placement.  CSW called and left voicemail for Raymond Gurney APS/DSS about Medicaid application.  Expected Discharge Plan: Long Term Nursing Home Barriers to Discharge: ED Patient Insisting on an Alternate Living Situation/Facility  Expected Discharge Plan and Services Expected Discharge Plan: Long Term Nursing Home In-house Referral: Clinical Social Work   Post Acute Care Choice: Nursing Home Living arrangements for the past 2 months: Single Family Home                                       Social Determinants of Health (SDOH) Interventions    Readmission Risk Interventions No flowsheet data found.

## 2020-04-23 NOTE — TOC Progression Note (Addendum)
Transition of Care [facility]) - Progression Note    Patient Details  Name: Tina Salinas MRN: [id number] Date of Birth: 06-Jan-1946  Transition of Care [facility]) CM/SW [place], [place] Phone Number: 04/23/2020, 9:58 AM  Clinical Narrative:     CSW sent out initial SNF placement referral and FL2.  Patient PASSR #[id number]  10:13AM: [facility] bed offer, SNF placement pending insurance authorization.  CSW spoke with Ebony Hail.  Expected Discharge Plan: Long Term Nursing Home Barriers to Discharge: ED Patient Insisting on an Alternate Living Situation/Facility  Expected Discharge Plan and Services Expected Discharge Plan: Long Term Nursing Home In-house Referral: Clinical Social Work   Post Acute Care Choice: Nursing Home Living arrangements for the past 2 months: Single Family Home                                       Social Determinants of Health (SDOH) Interventions    Readmission Risk Interventions No flowsheet data found.

## 2020-04-23 NOTE — ED Notes (Signed)
Pt refused position change.

## 2020-04-23 NOTE — ED Notes (Signed)
Report given to [place] RN.

## 2020-04-23 NOTE — TOC Progression Note (Signed)
Transition of Care [facility]) - Progression Note    Patient Details  Name: Tina Salinas MRN: [id number] Date of Birth: 19-Mar-1946  Transition of Care [facility]) CM/SW [place], [place] Phone Number: [phone] 04/23/2020, 4:20 PM  Clinical Narrative:     CSW spoke with patient about the needs for her to participate in PT and allow RNs to repositions her.  Patient stated she wanted to go home.  CSW and Raymond Gurney DSS/APS social worker spoke with patient to emphasize the need for her to go to a SNF for rehab.  Patient stated she wanted to return home and wanted to leave right away.  [name] stated the patient could not return home until she was able to manage for herself.  This CSW told the patient she had a bed offer at [facility] in [place].  Patient stated "I am not going there to [place]."  CSW tried to explain to patient that we had not control over which  Facility would be willing to take her.  Patient stated she wasn't going to [place], she would stay in [place].  Patient stated she wanted to go to [facility] or go back home.  CSW explained that I could not guarantee [facility] bed offer but that I would contact [facility] and ask about placement for her.  CSW spoke with Gerald Stabs at [employer] and he stated he would look at the patient's paperwork and contact this CSW about placement.  CSW spoke with Raymond Gurney DSS/APS and she asked if the patient had disclosed income for [facility] application.  CSW stated she spoke with patient friend and financial POA, [name] (who was visiting the patient when CSW spoke with patient) and [name] stated she did not have exact figures for financial information for patient but she would speak with the bank rep and let [name] know.    Patient pending SNF placement at [facility] or [facility] and insurance authorization.  Expected Discharge Plan: Long Term Nursing Home Barriers to Discharge: ED Patient Insisting on  an Alternate Living Situation/Facility  Expected Discharge Plan and Services Expected Discharge Plan: Long Term Nursing Home In-house Referral: Clinical Social Work   Post Acute Care Choice: Nursing Home Living arrangements for the past 2 months: Single Family Home                                       Social Determinants of Health (SDOH) Interventions    Readmission Risk Interventions No flowsheet data found.

## 2020-04-23 NOTE — Progress Notes (Signed)
PT Cancellation Note  Patient Details Name: Tina Salinas MRN: [id number] DOB: Aug 06, 1945   Cancelled Treatment:    Reason Eval/Treat Not Completed:  (Treatment session attempted.  Patient initially agreeable to session, but then distracted/perseverative on medications, need to "get the hell out of here"; generally agitated this date.  Attempted to redirect and provide supportive listening; patient intermittently argumentative with therapist and nurse at bedside. Agrees to attempt participation with therapy, voicing desire to "do more than sitting on the side of the bed", but then refuting stating "you know I can't sit up" minutes later.  Agreeable for position change, but then agitated and refusing as attempts commenced.  Patient very contradictory in speech and thought this date.  Unable/unwilling to actively participate with session at this time.  Will continue efforts at later time/date as medically appropriate and available.)   Berenis Corter H. Owens Shark, PT, DPT, NCS 04/23/20, 11:29 AM [phone]

## 2020-04-23 NOTE — ED Notes (Signed)
Pt given blanket and turned in bed at this time

## 2020-04-23 NOTE — ED Notes (Signed)
Social work at bedside.  

## 2020-04-23 NOTE — ED Notes (Signed)
Pt requesting to speak to social work. Social work contacted, will wait for DSS to arrive before speaking to patient.

## 2020-04-23 NOTE — TOC Progression Note (Signed)
Transition of Care [facility]) - Progression Note    Patient Details  Name: KHALIA GONG MRN: [id number] Date of Birth: 12/18/1945  Transition of Care [place]) CM/SW [place], [place] Phone Number: [phone] 04/23/2020, 1:46 PM  Clinical Narrative:     CSW received call from ED/RN stating the patient requested to talk to "the social worker who is in charge of my placement."  CSW looked over the patient's notes and noticed the patient has been unccoperative with PT and RNs.  CSW contacted Raymond Gurney APS/DSS social worker, for update on patient status and she requested that this CSW wait until [name] came by to talk to the patient before this CSW speaks with her.  CSW updated ED/RN on [name] coming to speak with patient.  ED/RN verbalized understanding.  Expected Discharge Plan: Long Term Nursing Home Barriers to Discharge: ED Patient Insisting on an Alternate Living Situation/Facility  Expected Discharge Plan and Services Expected Discharge Plan: Long Term Nursing Home In-house Referral: Clinical Social Work   Post Acute Care Choice: Nursing Home Living arrangements for the past 2 months: Single Family Home                                       Social Determinants of Health (SDOH) Interventions    Readmission Risk Interventions No flowsheet data found.

## 2020-04-23 NOTE — ED Notes (Signed)
Changed pt sacral dressing. Patient minimally cooperative. Would not allow to be rolled to fully access wound.

## 2020-04-23 NOTE — ED Notes (Signed)
Patient refusing all meds "except for the ones my heart doctor gives me." States "whatever you are giving me here is making me hallucinate, I called my heart doctor and they told me it wasn't anything they give me." Refuses to tell this RN what hallucinations pt is having. Refusing all assessments. MD aware.

## 2020-04-24 MED ORDER — CITALOPRAM HYDROBROMIDE 20 MG PO TABS
10.0000 mg | ORAL_TABLET | Freq: Every day | ORAL | Status: DC
  Administered 2020-04-26 – 2020-05-10 (×11): 10 mg via ORAL
  Filled 2020-04-24 (×14): qty 1

## 2020-04-24 MED ORDER — MIRTAZAPINE 15 MG PO TABS
15.0000 mg | ORAL_TABLET | Freq: Every day | ORAL | Status: DC
  Administered 2020-04-24 – 2020-05-09 (×14): 15 mg via ORAL
  Filled 2020-04-24 (×15): qty 1

## 2020-04-24 NOTE — ED Notes (Signed)
This RN to bedside. Pt refusing all care, refusing BP to be taken, to be repositioned in bed, all medications.

## 2020-04-24 NOTE — ED Notes (Signed)
Pt provided soda as requested

## 2020-04-24 NOTE — ED Notes (Signed)
Pt repositioned in bed, linens changed, gown changed from urine incontinence with Janett Billow RN and Modena Nunnery RN. purewick replaced.  Call bell within reach .

## 2020-04-24 NOTE — ED Notes (Signed)
[place] "Roni" [email or web address] [facility] [address] [place] [address] [place]: [phone]

## 2020-04-24 NOTE — Consult Note (Signed)
[place] Psychiat[facility] Consult   Reason for Consult:  Capacity evaluation Referring Physician:  EDP Patient Identification: Tina Salinas MRN:  [id number] Principal Diagnosis: UTI Diagnosis:  Active Problems:   Adjustment disorder with depressed mood   Total Time spent with patient: 45 minutes  Subjective:   Tina Salinas is a 74 y.o. female patient admitted with UTI.  Patient seen and evaluated in person by this provider. She told this provider along with TTS, Ian Malkin, that she is planning on going to [facility] ([facility]) on Monday for care, "they are holding a bed for me."  No resistance towards going.  Her only complaint is she want a different bed in the ED.  Discussed the need for her to be on the air mattress to reduce pressure on her decubitus ulcers. Patient is alert and oriented with the capacity to make independent medical decisions regarding her care.  Agreeable to the plan to go to [facility] on Monday.  HPI per MD:  Tina Salinas is a 74 y.o. female with a past medical history of cerebral palsy, CHF, hypertension, presents to the emergency department for generalized weakness and failure to thrive.  According to the Adult Protective Services worker's were with the patient patient lives alone, does not ambulate and largely stays in bed.  They have attempted multiple times to arrange for home health and aide, but the patient continues to fire the home health providers and refuses to be placed into a facility.  Patient is awake alert and oriented, she is able to give a history.  Patient has significant pressure sore injury is around her sacrum.  Patient is aware that these are worsening and at this time is agreeable to short-term rehab if we deem that necessary.  APS brought the patient to the emergency department for medical evaluation as well as psychiatric evaluation to see if the patient has capacity to refuse care.  Here patient does complain of pain around the sacrum states it has been  getting worse over the past few days to weeks.  Patient is aware that she has large sacral ulcerations but is unable to adequately care for them at home.  Denies any known fever cough or shortness of breath.  Denies any chest or abdominal pain.  Past Psychiatric History: insomnia  Risk to Self:   Risk to Others:   Prior Inpatient Therapy:   Prior Outpatient Therapy:    Past Medical History:  Past Medical History:  Diagnosis Date  . Adenoma   . CAP (community acquired pneumonia) 11/16/2017  . Cerebral palsy ([place])   . CHF (congestive heart failure) ([place])   . Femur fracture ([place])   . History of blood transfusion   . Hypertension   . Osteoarthritis   . Osteoporosis   . Rheumatoid arthritis [facility])     Past Surgical History:  Procedure Laterality Date  . BREAST ENHANCEMENT SURGERY Bilateral 1980  . COLONOSCOPY    . COLONOSCOPY WITH PROPOFOL N/A 06/18/2019   Procedure: COLONOSCOPY WITH PROPOFOL;  Surgeon: Robert Bellow, MD;  Location: [facility] ENDOSCOPY;  Service: Endoscopy;  Laterality: N/A;  . CORONARY ANGIOPLASTY WITH STENT PLACEMENT  07/13/2008, 08/19/2008  . ESOPHAGOGASTRODUODENOSCOPY (EGD) WITH PROPOFOL N/A 06/18/2019   Procedure: ESOPHAGOGASTRODUODENOSCOPY (EGD) WITH PROPOFOL;  Surgeon: Robert Bellow, MD;  Location: [facility] ENDOSCOPY;  Service: Endoscopy;  Laterality: N/A;  . HIP ARTHROPLASTY N/A 2000  . JOINT REPLACEMENT    . LEFT HEART CATH AND CORONARY ANGIOGRAPHY N/A 05/16/2017   Procedure:  LEFT HEART CATH AND CORONARY ANGIOGRAPHY;  Surgeon: Teodoro Spray, MD;  Location: [place] CV LAB;  Service: Cardiovascular;  Laterality: N/A;  . LEFT HEART CATH AND CORONARY ANGIOGRAPHY N/A 04/01/2020   Procedure: LEFT HEART CATH AND CORONARY ANGIOGRAPHY;  Surgeon: Isaias Cowman, MD;  Location: [place] CV LAB;  Service: Cardiovascular;  Laterality: N/A;  . TOTAL KNEE ARTHROPLASTY Left 09/2007, 01/2008   Family History:  Family History  Problem Relation Age of Onset  .  Alcohol abuse Father   . Raynaud syndrome Sister    Family Psychiatric  History: see above Social History:  Social History   Substance and Sexual Activity  Alcohol Use No     Social History   Substance and Sexual Activity  Drug Use No    Social History   Socioeconomic History  . Marital status: Widowed    Spouse name: Not on file  . Number of children: Not on file  . Years of education: Not on file  . Highest education level: Not on file  Occupational History  . Not on file  Tobacco Use  . Smoking status: Never Smoker  . Smokeless tobacco: Never Used  Vaping Use  . Vaping Use: Never used  Substance and Sexual Activity  . Alcohol use: No  . Drug use: No  . Sexual activity: Not on file  Other Topics Concern  . Not on file  Social History Narrative  . Not on file   Social Determinants of Health   Financial Resource Strain:   . Difficulty of Paying Living Expenses: Not on file  Food Insecurity:   . Worried About Charity fundraiser in the Last Year: Not on file  . Ran Out of Food in the Last Year: Not on file  Transportation Needs:   . Lack of Transportation (Medical): Not on file  . Lack of Transportation (Non-Medical): Not on file  Physical Activity:   . Days of Exercise per Week: Not on file  . Minutes of Exercise per Session: Not on file  Stress:   . Feeling of Stress : Not on file  Social Connections:   . Frequency of Communication with Friends and Family: Not on file  . Frequency of Social Gatherings with Friends and Family: Not on file  . Attends Religious Services: Not on file  . Active Member of Clubs or Organizations: Not on file  . Attends Archivist Meetings: Not on file  . Marital Status: Not on file   Additional Social History:    Allergies:   Allergies  Allergen Reactions  . Etodolac     Other reaction(s): Unknown  . Sulfa Antibiotics   . Tetracycline     Labs: No results found for this or any previous visit (from the past  48 hour(s)).  Current Facility-Administered Medications  Medication Dose Route Frequency Provider Last Rate Last Admin  . amiodarone (PACERONE) tablet 200 mg  200 mg Oral Daily Paulette Blanch, MD   200 mg at 04/23/20 1119  . aspirin EC tablet 81 mg  81 mg Oral Daily Paulette Blanch, MD   81 mg at 04/23/20 1115  . calcium-vitamin D (OSCAL WITH D) 500-200 MG-UNIT per tablet 2 tablet  2 tablet Oral Q breakfast Paulette Blanch, MD   2 tablet at 04/23/20 0850  . carvedilol (COREG) tablet 3.125 mg  3.125 mg Oral BID Paulette Blanch, MD   3.125 mg at 04/23/20 1115  . cephALEXin (KEFLEX) capsule 250  mg  250 mg Oral Q8H Paulette Blanch, MD   250 mg at 04/23/20 1740  . cholecalciferol (VITAMIN D3) tablet 1,000 Units  1,000 Units Oral Daily Paulette Blanch, MD   1,000 Units at 04/22/20 0920  . citalopram (CELEXA) tablet 10 mg  10 mg Oral Daily Waylan Boga Y, NP      . docusate sodium (COLACE) capsule 100 mg  100 mg Oral Daily Paulette Blanch, MD   100 mg at 04/22/20 0910  . ferrous [id number] C-folic acid (TRINSICON / FOLTRIN) capsule 1 capsule  1 capsule Oral BID PC Paulette Blanch, MD   1 capsule at 04/23/20 0850  . hydroxychloroquine (PLAQUENIL) tablet 400 mg  400 mg Oral Daily Paulette Blanch, MD   400 mg at 04/22/20 0920  . isosorbide mononitrate (IMDUR) 24 hr tablet 30 mg  30 mg Oral Daily Paulette Blanch, MD   30 mg at 04/22/20 0919  . latanoprost (XALATAN) 0.005 % ophthalmic solution 1 drop  1 drop Both Eyes QHS Paulette Blanch, MD   1 drop at 04/22/20 2220  . mirtazapine (REMERON) tablet 15 mg  15 mg Oral QHS Patrecia Pour, NP      . pravastatin (PRAVACHOL) tablet 40 mg  40 mg Oral QHS Paulette Blanch, MD   40 mg at 04/22/20 2214  . predniSONE (DELTASONE) tablet 2.5 mg  2.5 mg Oral Q breakfast Paulette Blanch, MD   2.5 mg at 04/23/20 0850  . sodium hypochlorite (DAKIN'S 1/4 STRENGTH) topical solution   Irrigation Q1400 Birdie Sons, MD   Given at 04/23/20 1937   Current Outpatient Medications  Medication Sig  Dispense Refill  . alendronate (FOSAMAX) 70 MG tablet Take 70 mg by mouth once a week.     Marland Kitchen amiodarone (PACERONE) 200 MG tablet Take 200 mg by mouth daily.    Marland Kitchen aspirin 81 MG tablet Take 81 mg by mouth daily.    . calcium-vitamin D (OSCAL WITH D) 500-200 MG-UNIT tablet Take 2 tablets by mouth daily with breakfast.    . carvedilol (COREG) 3.125 MG tablet Take 3.125 mg by mouth 2 (two) times daily.    . cholecalciferol (VITAMIN D) 1000 UNITS tablet Take 1,000 Units by mouth daily.     Marland Kitchen docusate sodium (COLACE) 100 MG capsule Take 1 capsule (100 mg total) by mouth daily. 10 capsule 0  . ferrous [id number] C-folic acid (TRINSICON / FOLTRIN) capsule Take 1 capsule by mouth 2 (two) times daily after a meal. 30 capsule 0  . furosemide (LASIX) 20 MG tablet Take 1 tablet (20 mg total) by mouth daily as needed for fluid or edema (weight gain of 3lbs in 1 day or 5 lbs in 3 days). 30 tablet 0  . HYDROcodone-acetaminophen (NORCO) 7.5-325 MG tablet Take 1-2 tablets by mouth 3 (three) times daily as needed for moderate pain. 540 tablet 0  . hydroxychloroquine (PLAQUENIL) 200 MG tablet Take 400 mg by mouth daily.     . isosorbide mononitrate (IMDUR) 30 MG 24 hr tablet Take 1 tablet (30 mg total) by mouth daily. 30 tablet 3  . latanoprost (XALATAN) 0.005 % ophthalmic solution Place 1 drop into both eyes at bedtime.    . Methotrexate 25 MG/ML SOSY Inject 0.8 mLs into the muscle every 7 (seven) days.    . mirtazapine (REMERON) 30 MG tablet Take 1 tablet (30 mg total) by mouth at bedtime. 30 tablet 2  . oxybutynin (DITROPAN) 5 MG  tablet TAKE 1 TABLET BY MOUTH TWICE DAILY AS NEEDED FOR BLADDER SPASMS 180 tablet 4  . pravastatin (PRAVACHOL) 40 MG tablet Take 1 tablet (40 mg total) by mouth at bedtime. 30 tablet 12  . predniSONE (DELTASONE) 2.5 MG tablet Take 2.5 mg by mouth daily with breakfast.    . promethazine (PHENERGAN) 25 MG tablet Take 25 mg by mouth every 6 (six) hours as needed for nausea or  vomiting.    . Vitamin D, Ergocalciferol, (DRISDOL) 1.25 MG (50000 UNIT) CAPS capsule Take 1 capsule (50,000 Units total) by mouth every 7 (seven) days. 12 capsule 4  . megestrol (MEGACE) 400 MG/10ML suspension Take 10 mLs (400 mg total) by mouth daily. (Patient not taking: Reported on 04/22/2020) 300 mL 1    Musculoskeletal: Strength & Muscle Tone: decreased and atrophy Gait & Station: unable to stand Patient leans: N/A  Psychiatric Specialty Exam: Physical Exam Vitals and nursing note reviewed.  Constitutional:      Appearance: Normal appearance.  HENT:     Head: Normocephalic.     Nose: Nose normal.  Pulmonary:     Effort: Pulmonary effort is normal.  Musculoskeletal:     Cervical back: Normal range of motion.  Neurological:     General: No focal deficit present.     Mental Status: She is alert and oriented to person, place, and time.  Psychiatric:        Attention and Perception: Attention and perception normal.        Mood and Affect: Mood is depressed.        Speech: Speech normal.        Behavior: Behavior normal. Behavior is cooperative.        Thought Content: Thought content normal.        Cognition and Memory: Cognition and memory normal.        Judgment: Judgment normal.     Review of Systems  Musculoskeletal: Positive for joint swelling.  Skin: Positive for wound.  Psychiatric/Behavioral: Positive for dysphoric mood.  All other systems reviewed and are negative.   Blood pressure 121/64, pulse 75, temperature 98.2 F (36.8 C), temperature source Oral, resp. rate (!) 22, height 5' (1.524 m), weight 43 kg, SpO2 100 %.Body mass index is 18.51 kg/m.  General Appearance: Casual  Eye Contact:  Good  Speech:  Normal Rate  Volume:  Normal  Mood:  Depressed and Dysphoric  Affect:  Congruent  Thought Process:  Coherent and Descriptions of Associations: Intact  Orientation:  Full (Time, Place, and Person)  Thought Content:  WDL and Logical  Suicidal Thoughts:  No   Homicidal Thoughts:  No  Memory:  Immediate;   Good Recent;   Good Remote;   Good  Judgement:  Fair  Insight:  Fair  Psychomotor Activity:  Decreased  Concentration:  Concentration: Good and Attention Span: Good  Recall:  Good  Fund of Knowledge:  Good  Language:  Good  Akathisia:  No  Handed:  Right  AIMS (if indicated):     Assets:  Housing Leisure Time Resilience  ADL's:  Impaired  Cognition:  WNL  Sleep:        Treatment Plan Summary: Patient is alert and oriented with capacity to make decisions regarding her health.  Agreeable to go to [facility] on Monday for care, patient there in the past.    Adjustment disorder with depressed mood: -Start Celexa 10 mg daily  Insomnia: -Decrease Remeron from 30 mg to 15 mg for  sleep vs depression as the dose up to 15 mg is effective on the histamine receptor, higher doses are not  Disposition: No evidence of imminent risk to self or others at present.   Patient does not meet criteria for psychiatric inpatient admission.  Waylan Boga, NP 04/24/2020 11:42 AM

## 2020-04-24 NOTE — ED Notes (Signed)
Lunch tray at bedside, pt resting at this time

## 2020-04-24 NOTE — ED Notes (Signed)
Pt provided warm blanket and lights dimmed for comfort. Pt continues to be disoriented x3

## 2020-04-24 NOTE — ED Notes (Signed)
Pt is alert and oriented to self only.  Pt states she needs her bags packed and she wants me to make sure she is "out of here by 12".  She states she wants me to leave her alone and not touch her legs.  Pt was provided with a coke and was assisted to drink approx 4 oz.  Pt is lying in bed covered with three blankets, on an air pressure mattress, bed in lowest position, lights dimmed for pt comfort.  No other needs are expressed at this time.

## 2020-04-24 NOTE — ED Notes (Signed)
Sacral wound dressing changed with Janett Billow RN and Mickel Baas NT

## 2020-04-24 NOTE — ED Notes (Signed)
Pt given breakfast tray, pt wants to leave and get into her car.

## 2020-04-25 MED ORDER — ACETAMINOPHEN 325 MG PO TABS
650.0000 mg | ORAL_TABLET | Freq: Once | ORAL | Status: AC
  Administered 2020-04-25: 650 mg via ORAL
  Filled 2020-04-25: qty 2

## 2020-04-25 MED ORDER — ACETAMINOPHEN 500 MG PO TABS
1000.0000 mg | ORAL_TABLET | Freq: Once | ORAL | Status: AC
  Administered 2020-04-25: 1000 mg via ORAL

## 2020-04-25 NOTE — ED Notes (Signed)
Patient screaming for a nurse. Entered the room and patient states she is having a bowel movement. Asked patient if she wanted a bed pan and she states "I don't want to shit the bed." Patient also states she doesn't believe she can use the bed pan without hurting her hips. Asked that this RN just leave her be and let her shit. Call bell given to patient and instructed on it's use. Will continue to monitor.

## 2020-04-25 NOTE — Consult Note (Signed)
[facility] Nurse Consult Note: Order received for "cream" for bedsores.  [place] Nurse saw patient on 10/7 and provided wound care orders to area to continue until Monday.  [place] Nurse placed patient on the [place] Nursing follow up list and will reassess tomorrow.   Thanks, Maudie Flakes, MSN, RN, [place], Arther Abbott  Pager# [phone]

## 2020-04-25 NOTE — ED Notes (Signed)
Patient has tolerated PO meds well. Given several warm blankets and the tv remote. Patient has asked that it be turned up all the way so she can hear "because she's old." Patient apologizes for for yelling and being rude citing she is just hurting.

## 2020-04-25 NOTE — ED Notes (Signed)
Patient verbally abusive to staff as she was yesterday

## 2020-04-25 NOTE — ED Notes (Signed)
Assumed care of pt at 1900, pt repositioned in bed, boots for heel protection placed back on patient. Pt reports arthritic pain when RN moves left leg. Ate pizza, banana, and cookie for dinner with coke. Medicated per MAR. Warm blanket provided. Alert and oriented x4. Pt inquiring about wound nurse visit, pt told she has not visited yet. Talking in small phrases with regular and unlabored breathing

## 2020-04-25 NOTE — ED Notes (Signed)
Patient has had large green bowel movement x 3. Patient cleaned, sheets and bed clothes changed. Patient is requesting some "cream on her bed sores." Will make contact with MD to obtain an order for types of dressings required.

## 2020-04-25 NOTE — ED Notes (Signed)
Patient with visitor at bedside. Rings out to say she has "pooped yay." Will have assistance to help clean her up and change her bed clothes ASAP. Patient believes she is at her own home at this time. Reoriented to the emergency department.

## 2020-04-25 NOTE — ED Notes (Signed)
Pt medicated per MAR, pillow placed under left knee for comfort

## 2020-04-25 NOTE — TOC Progression Note (Signed)
Transition of Care [facility]) - Progression Note    Patient Details  Name: Tina Salinas MRN: [id number] Date of Birth: [birth year]  Transition of Care [facility]) CM/SW [place], LCSW Phone Number: 04/25/2020, 1:51 PM  Clinical Narrative:  Social worker made follow-up call to [facility] APS/DSS [phone]. LM for worker to call [facility] team with update on medicaid. Need update on Medicaid application.   SW also followed up with [facility]- they are still waiting for insurance authorization.  This SW/writer is not able to verify insurance aut... not able to get anyone on the telephone. Plan is for pt to discharge to [facility] on Monday.    Expected Discharge Plan: Long Term Nursing Home Barriers to Discharge: ED Patient Insisting on an Alternate Living Situation/Facility  Expected Discharge Plan and Services Expected Discharge Plan: Long Term Nursing Home In-house Referral: Clinical Social Work   Post Acute Care Choice: Nursing Home Living arrangements for the past 2 months: Single Family Home                                       Social Determinants of Health (SDOH) Interventions    Readmission Risk Interventions No flowsheet data found.

## 2020-04-26 DIAGNOSIS — A419 Sepsis, unspecified organism: Secondary | ICD-10-CM | POA: Diagnosis not present

## 2020-04-26 DIAGNOSIS — R6520 Severe sepsis without septic shock: Secondary | ICD-10-CM | POA: Diagnosis not present

## 2020-04-26 LAB — CBC WITH DIFFERENTIAL/PLATELET
Abs Immature Granulocytes: 0.13 10*3/uL — ABNORMAL HIGH (ref 0.00–0.07)
Basophils Absolute: 0 10*3/uL (ref 0.0–0.1)
Basophils Relative: 0 %
Eosinophils Absolute: 0.1 10*3/uL (ref 0.0–0.5)
Eosinophils Relative: 1 %
HCT: 31.5 % — ABNORMAL LOW (ref 36.0–46.0)
Hemoglobin: 9.6 g/dL — ABNORMAL LOW (ref 12.0–15.0)
Immature Granulocytes: 1 %
Lymphocytes Relative: 8 %
Lymphs Abs: 0.9 10*3/uL (ref 0.7–4.0)
MCH: 27.8 pg (ref 26.0–34.0)
MCHC: 30.5 g/dL (ref 30.0–36.0)
MCV: 91.3 fL (ref 80.0–100.0)
Monocytes Absolute: 0.9 10*3/uL (ref 0.1–1.0)
Monocytes Relative: 7 %
Neutro Abs: 10.1 10*3/uL — ABNORMAL HIGH (ref 1.7–7.7)
Neutrophils Relative %: 83 %
Platelets: 285 10*3/uL (ref 150–400)
RBC: 3.45 MIL/uL — ABNORMAL LOW (ref 3.87–5.11)
RDW: 18.6 % — ABNORMAL HIGH (ref 11.5–15.5)
WBC: 12.1 10*3/uL — ABNORMAL HIGH (ref 4.0–10.5)
nRBC: 0 % (ref 0.0–0.2)

## 2020-04-26 LAB — URINALYSIS, COMPLETE (UACMP) WITH MICROSCOPIC
Bilirubin Urine: NEGATIVE
Glucose, UA: NEGATIVE mg/dL
Ketones, ur: 20 mg/dL — AB
Nitrite: POSITIVE — AB
Protein, ur: 100 mg/dL — AB
RBC / HPF: >50 RBC/hpf — ABNORMAL HIGH (ref 0–5)
Specific Gravity, Urine: 1.044 — ABNORMAL HIGH (ref 1.005–1.030)
pH: 5 (ref 5.0–8.0)

## 2020-04-26 LAB — GASTROINTESTINAL PANEL BY PCR, STOOL (REPLACES STOOL CULTURE)

## 2020-04-26 LAB — COMPREHENSIVE METABOLIC PANEL
ALT: 14 U/L (ref 0–44)
AST: 22 U/L (ref 15–41)
Albumin: 2.2 g/dL — ABNORMAL LOW (ref 3.5–5.0)
Alkaline Phosphatase: 59 U/L (ref 38–126)
Anion gap: 8 (ref 5–15)
BUN: 18 mg/dL (ref 8–23)
CO2: 25 mmol/L (ref 22–32)
Calcium: 8.3 mg/dL — ABNORMAL LOW (ref 8.9–10.3)
Chloride: 107 mmol/L (ref 98–111)
Creatinine, Ser: 0.78 mg/dL (ref 0.44–1.00)
GFR, Estimated: >60 mL/min (ref >60)
Glucose, Bld: 125 mg/dL — ABNORMAL HIGH (ref 70–99)
Potassium: 2.9 mmol/L — ABNORMAL LOW (ref 3.5–5.1)
Sodium: 140 mmol/L (ref 135–145)
Total Bilirubin: 0.5 mg/dL (ref 0.3–1.2)
Total Protein: 5.5 g/dL — ABNORMAL LOW (ref 6.5–8.1)

## 2020-04-26 LAB — LACTIC ACID, PLASMA
Lactic Acid, Venous: 2 mmol/L (ref 0.5–1.9)
Lactic Acid, Venous: 1.1 mmol/L (ref 0.5–1.9)

## 2020-04-26 LAB — CULTURE, BLOOD (SINGLE): Culture: NO GROWTH

## 2020-04-26 LAB — MAGNESIUM: Magnesium: 1.9 mg/dL (ref 1.7–2.4)

## 2020-04-26 LAB — RESPIRATORY PANEL BY RT PCR (FLU A&B, COVID)
Influenza A by PCR: NEGATIVE
Influenza B by PCR: NEGATIVE
SARS Coronavirus 2 by RT PCR: NEGATIVE

## 2020-04-26 MED ORDER — SODIUM CHLORIDE 0.9 % IV BOLUS
500.0000 mL | Freq: Once | INTRAVENOUS | Status: AC
  Administered 2020-04-26: 500 mL via INTRAVENOUS

## 2020-04-26 MED ORDER — SODIUM CHLORIDE 0.9 % IV SOLN
1.0000 g | Freq: Once | INTRAVENOUS | Status: AC
  Administered 2020-04-26: 1 g via INTRAVENOUS
  Filled 2020-04-26: qty 10

## 2020-04-26 MED ORDER — HYDROCODONE-ACETAMINOPHEN 7.5-325 MG PO TABS
1.0000 | ORAL_TABLET | Freq: Four times a day (QID) | ORAL | Status: DC | PRN
  Administered 2020-04-26: 1 via ORAL
  Administered 2020-04-27 – 2020-04-28 (×2): 2 via ORAL
  Administered 2020-04-28: 1 via ORAL
  Administered 2020-04-30 – 2020-05-01 (×4): 2 via ORAL
  Administered 2020-05-07: 1 via ORAL
  Filled 2020-04-26 (×5): qty 1
  Filled 2020-04-26 (×5): qty 2

## 2020-04-26 MED ORDER — COLLAGENASE 250 UNIT/GM EX OINT
TOPICAL_OINTMENT | Freq: Every day | CUTANEOUS | Status: DC
  Filled 2020-04-26: qty 30

## 2020-04-26 MED ORDER — ENOXAPARIN SODIUM 30 MG/0.3ML ~~LOC~~ SOLN
30.0000 mg | SUBCUTANEOUS | Status: DC
  Administered 2020-04-26 – 2020-05-03 (×8): 30 mg via SUBCUTANEOUS
  Filled 2020-04-26 (×9): qty 0.3

## 2020-04-26 MED ORDER — SODIUM CHLORIDE 0.9 % IV SOLN
1.0000 g | INTRAVENOUS | Status: DC
  Administered 2020-04-27: 1 g via INTRAVENOUS
  Filled 2020-04-26: qty 10

## 2020-04-26 MED ORDER — POTASSIUM CHLORIDE CRYS ER 20 MEQ PO TBCR
40.0000 meq | EXTENDED_RELEASE_TABLET | Freq: Two times a day (BID) | ORAL | Status: DC
  Administered 2020-04-26 (×2): 40 meq via ORAL
  Filled 2020-04-26 (×2): qty 2

## 2020-04-26 MED ORDER — MEGESTROL ACETATE 400 MG/10ML PO SUSP
400.0000 mg | Freq: Every day | ORAL | Status: DC
  Administered 2020-04-26 – 2020-05-07 (×10): 400 mg via ORAL
  Filled 2020-04-26 (×12): qty 10

## 2020-04-26 MED ORDER — SODIUM CHLORIDE 0.9 % IV SOLN: INTRAVENOUS | Status: DC

## 2020-04-26 NOTE — ED Notes (Signed)
Unsuccessful with blood draw. Lab contacted to come stick.

## 2020-04-26 NOTE — TOC Progression Note (Signed)
Transition of Care [facility]) - Progression Note    Patient Details  Name: Tina Salinas MRN: [id number] Date of Birth: [birth year]  Transition of Care [facility]) CM/SW [place], [place] Phone Number: [phone] 04/26/2020, 3:38 PM  Clinical Narrative:     CSW spoke with patient's POA, Norma Fredrickson and she stated the patient has stocks and bonds and has tried to apply for Medicaid in the past and has been unable to qualify. CSW called [place] APS/DSS with update on financial information.  Expected Discharge Plan: Long Term Nursing Home Barriers to Discharge: ED Patient Insisting on an Alternate Living Situation/Facility  Expected Discharge Plan and Services Expected Discharge Plan: Long Term Nursing Home In-house Referral: Clinical Social Work   Post Acute Care Choice: Nursing Home Living arrangements for the past 2 months: Single Family Home                                       Social Determinants of Health (SDOH) Interventions    Readmission Risk Interventions No flowsheet data found.

## 2020-04-26 NOTE — H&P (Signed)
History and Physical    Tina Salinas ZOX:[id number] DOB: Nov 21, 1945 DOA: 04/21/2020  PCP: Birdie Sons, MD   Patient coming from: Home    Chief Complaint: Weakness  HPI: Tina Salinas is a 74 y.o. female with medical history significant of cerebral palsy, congestive heart failure, hypertension, rheumatoid arthritis, hyperlipidemia, proximal A. fib who presents from home with complaints of generalized weakness, failure to thrive.  She does not have any family members and she lives alone and is taken care of by home health aides.  She is under adult protective services.  She does not ambulate and stays on the bed.  Last time she ambulated was a long time ago as per the patient.  Recently, patient has started firing the home health providers and APS is trying to arrange a placement facility.  She was presented to the emergency department with failure to thrive and generalized weakness as well as psychiatric evaluation. She was also seen by psychiatry during this hospitalization who did not recommend inpatient psychiatric admission.  On presentation the emergency department on 04/21/2020 she was suspected to have urine tract infection per the blood work done today showed elevated lactate, severe hypokalemia so we were requested for medical admission.  Social worker has been actively following her in the emergency department since last few days for placement. Patient seen and examined at the bedside in the emergency department.  During my evaluation, she was alert and awake, oriented to the place and year and denied any specific complaints.  RN reported me that she is having diarrhea. Patient denied any chest pain, shortness of breath, fever, cough, abdomen, nausea, vomiting, dysuria.  She repeatedly told me that she does not want to be placed in a nursing facility and wants to go back to home.  ED Course: Hemodynamically stable during my evaluation but her blood pressure was soft.  She looked  comfortable.  She communicates well.  She says she has been bedbound for last several years but she used to work when she was younger.  She does not have any family members but has friends.  She denied firing the home health service staff.  Review of Systems: As per HPI otherwise 10 point review of systems negative.    Past Medical History:  Diagnosis Date  . Adenoma   . CAP (community acquired pneumonia) 11/16/2017  . Cerebral palsy ([place])   . CHF (congestive heart failure) ([place])   . Femur fracture ([place])   . History of blood transfusion   . Hypertension   . Osteoarthritis   . Osteoporosis   . Rheumatoid arthritis [facility])     Past Surgical History:  Procedure Laterality Date  . BREAST ENHANCEMENT SURGERY Bilateral 1980  . COLONOSCOPY    . COLONOSCOPY WITH PROPOFOL N/A 06/18/2019   Procedure: COLONOSCOPY WITH PROPOFOL;  Surgeon: Robert Bellow, MD;  Location: [facility] ENDOSCOPY;  Service: Endoscopy;  Laterality: N/A;  . CORONARY ANGIOPLASTY WITH STENT PLACEMENT  07/13/2008, 08/19/2008  . ESOPHAGOGASTRODUODENOSCOPY (EGD) WITH PROPOFOL N/A 06/18/2019   Procedure: ESOPHAGOGASTRODUODENOSCOPY (EGD) WITH PROPOFOL;  Surgeon: Robert Bellow, MD;  Location: [facility] ENDOSCOPY;  Service: Endoscopy;  Laterality: N/A;  . HIP ARTHROPLASTY N/A 2000  . JOINT REPLACEMENT    . LEFT HEART CATH AND CORONARY ANGIOGRAPHY N/A 05/16/2017   Procedure: LEFT HEART CATH AND CORONARY ANGIOGRAPHY;  Surgeon: Teodoro Spray, MD;  Location: [place] CV LAB;  Service: Cardiovascular;  Laterality: N/A;  . LEFT HEART CATH AND CORONARY ANGIOGRAPHY N/A  04/01/2020   Procedure: LEFT HEART CATH AND CORONARY ANGIOGRAPHY;  Surgeon: Isaias Cowman, MD;  Location: [place] CV LAB;  Service: Cardiovascular;  Laterality: N/A;  . TOTAL KNEE ARTHROPLASTY Left 09/2007, 01/2008     reports that she has never smoked. She has never used smokeless tobacco. She reports that she does not drink alcohol and does not use  drugs.  Allergies  Allergen Reactions  . Etodolac     Other reaction(s): Unknown  . Sulfa Antibiotics   . Tetracycline     Family History  Problem Relation Age of Onset  . Alcohol abuse Father   . Raynaud syndrome Sister      Prior to Admission medications   Medication Sig Start Date End Date Taking? Authorizing Provider  alendronate (FOSAMAX) 70 MG tablet Take 70 mg by mouth once a week.  03/30/14  Yes [provider]  amiodarone (PACERONE) 200 MG tablet Take 200 mg by mouth daily.   Yes [provider]  aspirin 81 MG tablet Take 81 mg by mouth daily.   Yes [provider]  calcium-vitamin D (OSCAL WITH D) 500-200 MG-UNIT tablet Take 2 tablets by mouth daily with breakfast.   Yes [provider]  carvedilol (COREG) 3.125 MG tablet Take 3.125 mg by mouth 2 (two) times daily. 03/27/20  Yes [provider]  cholecalciferol (VITAMIN D) 1000 UNITS tablet Take 1,000 Units by mouth daily.  04/25/10  Yes [provider]  docusate sodium (COLACE) 100 MG capsule Take 1 capsule (100 mg total) by mouth daily. 12/05/19  Yes Lavina Hamman, MD  ferrous [id number] C-folic acid (TRINSICON / FOLTRIN) capsule Take 1 capsule by mouth 2 (two) times daily after a meal. 12/04/19  Yes Lavina Hamman, MD  furosemide (LASIX) 20 MG tablet Take 1 tablet (20 mg total) by mouth daily as needed for fluid or edema (weight gain of 3lbs in 1 day or 5 lbs in 3 days). 12/05/19 12/04/20 Yes Lavina Hamman, MD  HYDROcodone-acetaminophen (NORCO) 7.5-325 MG tablet Take 1-2 tablets by mouth 3 (three) times daily as needed for moderate pain. 02/04/20  Yes Birdie Sons, MD  hydroxychloroquine (PLAQUENIL) 200 MG tablet Take 400 mg by mouth daily.    Yes [provider]  isosorbide mononitrate (IMDUR) 30 MG 24 hr tablet Take 1 tablet (30 mg total) by mouth daily. 04/03/20  Yes Dwyane Dee, MD  latanoprost (XALATAN) 0.005 % ophthalmic solution Place 1  drop into both eyes at bedtime.   Yes [provider]  Methotrexate 25 MG/ML SOSY Inject 0.8 mLs into the muscle every 7 (seven) days. 03/15/20  Yes [provider]  mirtazapine (REMERON) 30 MG tablet Take 1 tablet (30 mg total) by mouth at bedtime. 06/06/19  Yes Birdie Sons, MD  oxybutynin (DITROPAN) 5 MG tablet TAKE 1 TABLET BY MOUTH TWICE DAILY AS NEEDED FOR BLADDER SPASMS 01/20/20  Yes Birdie Sons, MD  pravastatin (PRAVACHOL) 40 MG tablet Take 1 tablet (40 mg total) by mouth at bedtime. 07/23/19  Yes Birdie Sons, MD  predniSONE (DELTASONE) 2.5 MG tablet Take 2.5 mg by mouth daily with breakfast.   Yes [provider]  promethazine (PHENERGAN) 25 MG tablet Take 25 mg by mouth every 6 (six) hours as needed for nausea or vomiting.   Yes [provider]  Vitamin D, Ergocalciferol, (DRISDOL) 1.25 MG (50000 UNIT) CAPS capsule Take 1 capsule (50,000 Units total) by mouth every 7 (seven) days. 11/24/19  Yes  Birdie Sons, MD  megestrol (MEGACE) 400 MG/10ML suspension Take 10 mLs (400 mg total) by mouth daily. Patient not taking: Reported on 04/22/2020 02/05/20   Birdie Sons, MD    Physical Exam: Vitals:   04/26/20 1200 04/26/20 1400 04/26/20 1500 04/26/20 1600  BP: (!) 100/55 (!) 92/58 (!) 100/58 (!) 91/56  Pulse: (!) 56 (!) 55 (!) 57 62  Resp: 16 16 18 16   Temp:      TempSrc:      SpO2: 100% 99% 100% 100%  Weight:      Height:        Constitutional: Comfortable, extremely deconditioned, debilitated Vitals:   04/26/20 1200 04/26/20 1400 04/26/20 1500 04/26/20 1600  BP: (!) 100/55 (!) 92/58 (!) 100/58 (!) 91/56  Pulse: (!) 56 (!) 55 (!) 57 62  Resp: 16 16 18 16   Temp:      TempSrc:      SpO2: 100% 99% 100% 100%  Weight:      Height:       Eyes: PERRL, lids and conjunctivae normal ENMT: Mucous membranes are moist.  Neck: normal, supple, no masses, no thyromegaly Respiratory: clear to auscultation bilaterally, no wheezing, no  crackles. Normal respiratory effort. No accessory muscle use.  Cardiovascular: Regular rate and rhythm, no murmurs / rubs / gallops. No extremity edema.  Abdomen: no tenderness, no masses palpated. No hepatosplenomegaly. Bowel sounds positive.  Musculoskeletal: no clubbing / cyanosis.  Strictures of the lower extremity Skin: Stage III sacral pressure ulcer Neurologic: CN 2-12 grossly intact.  Generalized weakness Psychiatric: Normal judgment and insight. Alert and awake.  Tells correct year, knows current location  Foley Catheter:None  Labs on Admission: I have personally reviewed following labs and imaging studies  CBC: Recent Labs  Lab 04/21/20 1311 04/26/20 1041  WBC 7.9 12.1*  NEUTROABS 6.5 10.1*  HGB 10.4* 9.6*  HCT 33.8* 31.5*  MCV 90.1 91.3  PLT 421* 330   Basic Metabolic Panel: Recent Labs  Lab 04/21/20 1311 04/26/20 1041  NA 139 140  K 3.4* 2.9*  CL 108 107  CO2 22 25  GLUCOSE 125* 125*  BUN 14 18  CREATININE 0.78 0.78  CALCIUM 7.3* 8.3*   GFR: Estimated Creatinine Clearance: 43.1 mL/min (by C-G formula based on SCr of 0.78 mg/dL). Liver Function Tests: Recent Labs  Lab 04/21/20 1311 04/26/20 1041  AST 45* 22  ALT 18 14  ALKPHOS 63 59  BILITOT 0.6 0.5  PROT 5.8* 5.5*  ALBUMIN 2.2* 2.2*   No results for input(s): LIPASE, AMYLASE in the last 168 hours. No results for input(s): AMMONIA in the last 168 hours. Coagulation Profile: No results for input(s): INR, PROTIME in the last 168 hours. Cardiac Enzymes: No results for input(s): CKTOTAL, CKMB, CKMBINDEX, TROPONINI in the last 168 hours. BNP (last 3 results) No results for input(s): PROBNP in the last 8760 hours. HbA1C: No results for input(s): HGBA1C in the last 72 hours. CBG: No results for input(s): GLUCAP in the last 168 hours. Lipid Profile: No results for input(s): CHOL, HDL, LDLCALC, TRIG, CHOLHDL, LDLDIRECT in the last 72 hours. Thyroid Function Tests: No results for input(s): TSH,  T4TOTAL, FREET4, T3FREE, THYROIDAB in the last 72 hours. Anemia Panel: No results for input(s): VITAMINB12, FOLATE, FERRITIN, TIBC, IRON, RETICCTPCT in the last 72 hours. Urine analysis:    Component Value Date/Time   COLORURINE AMBER (A) 04/26/2020 1458   APPEARANCEUR CLOUDY (A) 04/26/2020 1458   LABSPEC 1.044 (H) 04/26/2020 1458  PHURINE 5.0 04/26/2020 1458   GLUCOSEU NEGATIVE 04/26/2020 1458   HGBUR SMALL (A) 04/26/2020 1458   BILIRUBINUR NEGATIVE 04/26/2020 1458   KETONESUR 20 (A) 04/26/2020 1458   PROTEINUR 100 (A) 04/26/2020 1458   NITRITE POSITIVE (A) 04/26/2020 1458   LEUKOCYTESUR TRACE (A) 04/26/2020 1458    Radiological Exams on Admission: No results found.   Assessment/Plan Active Problems:   Adjustment disorder with depressed mood   Infantile cerebral palsy (HCC)   Chronic combined systolic and diastolic CHF (congestive heart failure) (HCC)   Malnutrition of moderate degree   Hypokalemia   Pressure injury of skin   Sepsis ([place])   Sepsis/urinary tract infection: Blood pressure is soft, lactate of 2 and has leukocytosis.  Urine culture sent few days ago showed multiple species.  Will repeat urine culture.  Urinalysis positive UTI.  Patient denies dysuria.  She is afebrile.  Continue ceftriaxone awaiting urine culture.  Severe hypokalemia: Currently being supplemented.  Will check magnesium.  Most likely associated with diarrhea.  Diarrhea: Has been having diarrhea since last few days.  Check GI pathogen panel.  Denies any abdominal pain.  Abdomen is soft and nondistended and nontender.  Severe systolic congestive heart failure: Currently euvolemic or dehydrated.  Echocardiogram done on 03/31/2020 showed ejection fraction of less than 20%.  On Lasix at home which is on hold due to soft blood pressure diarrhea.  Coronary artery disease: Cardiac cath done on 9/21 showed nonobstructive coronary artery disease. On aspirin  History of paroxysmal A. fib: Currently in  normal sinus rhythm.  On amiodarone.  On carvedilol for rate control.  Hypertension: Blood pressure is soft. Antihypertensives on hold.  History of rheumatoid arthritis: On prednisone, methotrexate, hydroxychloroquine.  Continue prednisone and hydroxychloroquine.  Stage III sacral ulcer: Has a week sacral ulcer with slough does not look infected.  Continue wound care, wound care nurse has been following.  Frequent turning advised.  Adjustment disorder with depressed mood: Started on Celexa during this hospitalization.  Continue Remeron.  Psychiatry seen the patient and did not recommend inpatient psychiatric admission.  Hyperlipidemia: On pravastatin  Infantile cerebral palsy: Bedbound .  Has contractures on the lower extremities.  Continue supportive care.  Debility/deconditioning/placement issues: Education officer, museum closely following and trying to arrange skilled facility placement.  She is on under Adult Scientist, forensic.  We will continue to communicate with social worker for placement decision.   Severity of Illness: The appropriate patient status for this patient is OBSERVATION.     DVT prophylaxis: Lovenox Code Status: Full Family Communication: None Consults called: None     Shelly Coss MD [facility]  04/26/2020, 5:33 PM

## 2020-04-26 NOTE — ED Notes (Signed)
Dr. Tamala Julian made aware of pt BP with MAPs in the 50s, plan to give PO fluids pt mentating well at this time. C/o chronic pain in legs

## 2020-04-26 NOTE — TOC Progression Note (Signed)
Transition of Care [facility]) - Progression Note    Patient Details  Name: Tina Salinas MRN: [id number] Date of Birth: 1945/10/14  Transition of Care [facility]) CM/SW [place], [place] Phone Number: [phone] 04/26/2020, 2:04 PM  Clinical Narrative:     CSW spoke with Raymond Gurney at [facility] APD/DSS for update on [facility] application. [name] stated she still does not have the patient's income information. CSW contacted and left a voicemail for [employer], patient's POA for assistance in financial information.  CSW called [facility] to find out the cost for private Long term care pay, it is $10,830 per month.  Currently the Medicaid application is holding up the d/ to [facility] for SNF then to LTC. [facility] form [facility] stated they would not be able to take the patient for SNF if she does not have the means to pay for long term care, since they will consider it an unsafe discharge at home and SNF.  Expected Discharge Plan: Long Term Nursing Home Barriers to Discharge: ED Patient Insisting on an Alternate Living Situation/Facility  Expected Discharge Plan and Services Expected Discharge Plan: Long Term Nursing Home In-house Referral: Clinical Social Work   Post Acute Care Choice: Nursing Home Living arrangements for the past 2 months: Single Family Home                                       Social Determinants of Health (SDOH) Interventions    Readmission Risk Interventions No flowsheet data found.

## 2020-04-26 NOTE — TOC Progression Note (Signed)
Transition of Care [facility]) - Progression Note    Patient Details  Name: Tina Salinas MRN: [id number] Date of Birth: September 22, 1945  Transition of Care [facility]) CM/SW [place], [place] Phone Number: 04/26/2020, 11:24 AM  Clinical Narrative:    Damaris Schooner to APS/DSS [place], [place], [place] patient is not Medicaid eligible and plans to return home after SNF stay to get stronger. Roni has attempted to speak with patient about potential LTC however patient is adamant to return home.    Expected Discharge Plan: Long Term Nursing Home Barriers to Discharge: ED Patient Insisting on an Alternate Living Situation/Facility  Expected Discharge Plan and Services Expected Discharge Plan: Long Term Nursing Home In-house Referral: Clinical Social Work   Post Acute Care Choice: Nursing Home Living arrangements for the past 2 months: Single Family Home                                       Social Determinants of Health (SDOH) Interventions    Readmission Risk Interventions No flowsheet data found.

## 2020-04-26 NOTE — ED Provider Notes (Signed)
Patient noted to be intermittently hypotensive overnight. This seems to be an ongoing, recurrent issue for which she has been seen before. Repeat labs show mild leukocytosis, but otherwise are near baseline. K 2.9. Will replete K, hold her Imdur and put hold parameters on Coreg. She is otherwise afebrile, nontoxic without signs of sepsis or systemic illness. Will re-check a UA to make sure she has cleared her UTI, continue SW dispo.   Duffy Bruce, MD 04/26/20 [phone]

## 2020-04-26 NOTE — Consult Note (Addendum)
[place] Nurse wound follow up Refer to previous [place] consult note on 10/7; patient has been on Dakins solution for wounds 5 days and the time frame which is allowed for this topical treatment has expired.  Reassessed sacrum/bilat buttock wounds. Pt is in the ED on a low airloss mattress to reduce pressure.  She is very emaciated with protruding bones and is immobile and crying in pain.  Wound type: Left hip with dark purple deep tissue injury; 5X7cm Buttocks/sacrum with 2 wounds which are separated with a bridge of intact skin Left upper buttock/sacrum is stage 4 pressure injury; 5X7X.3cm, 80% loose yellow slough, 20% red, bone palpable with swab.  Mod amt green strong smelling drainage Right upper buttock/sacrum is unstageable; 100% tightly adhered eschar; 5X5cm, mod amt tan drainage and strong odor.  Dressing procedure/placement/frequency: There are multiple systemic factors which can impair healing. If aggressive plan of care is desired, then recommend surgical consult for possible debridement of right buttock unstageable wound.  Pt could benefit from comfort care goals.  Topical treatment orders provided for bedside nurses to perform daily as follows to assist with enzymatic debridement of nonviable tissue:  Apply Santyl to left and right upper buttock/sacrum wounds Q day, then cover with moist fluffed gauze and foam dressing.  (Change foam dressing Q 3 days or PRN soiling.) Foam dressing to deep tissue pressure injury areas, change Q 3 days or PRN soiling. Please re-consult if further assistance is needed.  Thank-you,  Julien Girt MSN, [place], [place], [place], [place]

## 2020-04-26 NOTE — ED Notes (Signed)
Case manager at bedside 

## 2020-04-26 NOTE — TOC Progression Note (Signed)
Transition of Care [facility]) - Progression Note    Patient Details  Name: Tina Salinas MRN: [id number] Date of Birth: 04-Sep-1945  Transition of Care [facility]) CM/SW [place], [place] Phone Number: [phone] 04/26/2020, 1:50 PM  Clinical Narrative:     CSW spoke with Ebony Hail at [facility] about placement and they are reluctant to take the patient for SNF if she is unable to stay for LTC.  CSW called Raymond Gurney at APS and left her a message requested a call back.  Expected Discharge Plan: Long Term Nursing Home Barriers to Discharge: ED Patient Insisting on an Alternate Living Situation/Facility  Expected Discharge Plan and Services Expected Discharge Plan: Long Term Nursing Home In-house Referral: Clinical Social Work   Post Acute Care Choice: Nursing Home Living arrangements for the past 2 months: Single Family Home                                       Social Determinants of Health (SDOH) Interventions    Readmission Risk Interventions No flowsheet data found.

## 2020-04-26 NOTE — TOC Progression Note (Addendum)
Transition of Care [facility]) - Progression Note    Patient Details  Name: Tina Salinas MRN: [id number] Date of Birth: Dec 03, 1945  Transition of Care [facility]) CM/SW [place], [place] Phone Number: [phone] 04/26/2020, 11:53 AM  Clinical Narrative:     CSW left voicemail for Ebony Hail at [facility] for update on insurance authorization.  Patient is pending insurance auth for d/c.   Expected Discharge Plan: Long Term Nursing Home Barriers to Discharge: ED Patient Insisting on an Alternate Living Situation/Facility  Expected Discharge Plan and Services Expected Discharge Plan: Long Term Nursing Home In-house Referral: Clinical Social Work   Post Acute Care Choice: Nursing Home Living arrangements for the past 2 months: Single Family Home                                       Social Determinants of Health (SDOH) Interventions    Readmission Risk Interventions No flowsheet data found.

## 2020-04-27 ENCOUNTER — Encounter: Payer: Self-pay | Admitting: Internal Medicine

## 2020-04-27 DIAGNOSIS — M069 Rheumatoid arthritis, unspecified: Secondary | ICD-10-CM | POA: Diagnosis present

## 2020-04-27 DIAGNOSIS — D638 Anemia in other chronic diseases classified elsewhere: Secondary | ICD-10-CM | POA: Diagnosis present

## 2020-04-27 DIAGNOSIS — Z602 Problems related to living alone: Secondary | ICD-10-CM | POA: Diagnosis present

## 2020-04-27 DIAGNOSIS — F4321 Adjustment disorder with depressed mood: Secondary | ICD-10-CM | POA: Diagnosis present

## 2020-04-27 DIAGNOSIS — E876 Hypokalemia: Secondary | ICD-10-CM | POA: Diagnosis present

## 2020-04-27 DIAGNOSIS — L89150 Pressure ulcer of sacral region, unstageable: Secondary | ICD-10-CM | POA: Diagnosis not present

## 2020-04-27 DIAGNOSIS — I5042 Chronic combined systolic (congestive) and diastolic (congestive) heart failure: Secondary | ICD-10-CM | POA: Diagnosis not present

## 2020-04-27 DIAGNOSIS — L89153 Pressure ulcer of sacral region, stage 3: Secondary | ICD-10-CM | POA: Diagnosis present

## 2020-04-27 DIAGNOSIS — Z95828 Presence of other vascular implants and grafts: Secondary | ICD-10-CM | POA: Diagnosis not present

## 2020-04-27 DIAGNOSIS — I5043 Acute on chronic combined systolic (congestive) and diastolic (congestive) heart failure: Secondary | ICD-10-CM | POA: Diagnosis present

## 2020-04-27 DIAGNOSIS — Z66 Do not resuscitate: Secondary | ICD-10-CM | POA: Diagnosis not present

## 2020-04-27 DIAGNOSIS — Z792 Long term (current) use of antibiotics: Secondary | ICD-10-CM | POA: Diagnosis not present

## 2020-04-27 DIAGNOSIS — A4152 Sepsis due to Pseudomonas: Secondary | ICD-10-CM | POA: Diagnosis present

## 2020-04-27 DIAGNOSIS — R531 Weakness: Secondary | ICD-10-CM | POA: Diagnosis present

## 2020-04-27 DIAGNOSIS — Z681 Body mass index (BMI) 19 or less, adult: Secondary | ICD-10-CM | POA: Diagnosis not present

## 2020-04-27 DIAGNOSIS — D75839 Thrombocytosis, unspecified: Secondary | ICD-10-CM | POA: Diagnosis present

## 2020-04-27 DIAGNOSIS — R6520 Severe sepsis without septic shock: Secondary | ICD-10-CM | POA: Diagnosis not present

## 2020-04-27 DIAGNOSIS — Z20822 Contact with and (suspected) exposure to covid-19: Secondary | ICD-10-CM | POA: Diagnosis present

## 2020-04-27 DIAGNOSIS — Z7401 Bed confinement status: Secondary | ICD-10-CM | POA: Diagnosis not present

## 2020-04-27 DIAGNOSIS — G809 Cerebral palsy, unspecified: Secondary | ICD-10-CM | POA: Diagnosis present

## 2020-04-27 DIAGNOSIS — I480 Paroxysmal atrial fibrillation: Secondary | ICD-10-CM | POA: Diagnosis present

## 2020-04-27 DIAGNOSIS — Z452 Encounter for adjustment and management of vascular access device: Secondary | ICD-10-CM | POA: Diagnosis not present

## 2020-04-27 DIAGNOSIS — N390 Urinary tract infection, site not specified: Secondary | ICD-10-CM | POA: Diagnosis not present

## 2020-04-27 DIAGNOSIS — E43 Unspecified severe protein-calorie malnutrition: Secondary | ICD-10-CM | POA: Diagnosis present

## 2020-04-27 DIAGNOSIS — E872 Acidosis: Secondary | ICD-10-CM | POA: Diagnosis present

## 2020-04-27 DIAGNOSIS — E860 Dehydration: Secondary | ICD-10-CM | POA: Diagnosis present

## 2020-04-27 DIAGNOSIS — Z7189 Other specified counseling: Secondary | ICD-10-CM | POA: Diagnosis not present

## 2020-04-27 DIAGNOSIS — E440 Moderate protein-calorie malnutrition: Secondary | ICD-10-CM | POA: Diagnosis not present

## 2020-04-27 DIAGNOSIS — R627 Adult failure to thrive: Secondary | ICD-10-CM | POA: Diagnosis present

## 2020-04-27 DIAGNOSIS — A419 Sepsis, unspecified organism: Secondary | ICD-10-CM | POA: Diagnosis not present

## 2020-04-27 DIAGNOSIS — Z515 Encounter for palliative care: Secondary | ICD-10-CM | POA: Diagnosis not present

## 2020-04-27 DIAGNOSIS — I110 Hypertensive heart disease with heart failure: Secondary | ICD-10-CM | POA: Diagnosis present

## 2020-04-27 DIAGNOSIS — R54 Age-related physical debility: Secondary | ICD-10-CM | POA: Diagnosis present

## 2020-04-27 DIAGNOSIS — I82621 Acute embolism and thrombosis of deep veins of right upper extremity: Secondary | ICD-10-CM | POA: Diagnosis not present

## 2020-04-27 DIAGNOSIS — E785 Hyperlipidemia, unspecified: Secondary | ICD-10-CM | POA: Diagnosis present

## 2020-04-27 LAB — BASIC METABOLIC PANEL
Anion gap: 9 (ref 5–15)
BUN: 13 mg/dL (ref 8–23)
CO2: 20 mmol/L — ABNORMAL LOW (ref 22–32)
Calcium: 7.7 mg/dL — ABNORMAL LOW (ref 8.9–10.3)
Chloride: 110 mmol/L (ref 98–111)
Creatinine, Ser: 0.66 mg/dL (ref 0.44–1.00)
GFR, Estimated: >60 mL/min (ref >60)
Glucose, Bld: 94 mg/dL (ref 70–99)
Potassium: 3.6 mmol/L (ref 3.5–5.1)
Sodium: 139 mmol/L (ref 135–145)

## 2020-04-27 LAB — CBC WITH DIFFERENTIAL/PLATELET
Abs Immature Granulocytes: 0.13 10*3/uL — ABNORMAL HIGH (ref 0.00–0.07)
Basophils Absolute: 0 10*3/uL (ref 0.0–0.1)
Basophils Relative: 0 %
Eosinophils Absolute: 0.1 10*3/uL (ref 0.0–0.5)
Eosinophils Relative: 1 %
HCT: 27.6 % — ABNORMAL LOW (ref 36.0–46.0)
Hemoglobin: 8.5 g/dL — ABNORMAL LOW (ref 12.0–15.0)
Immature Granulocytes: 1 %
Lymphocytes Relative: 12 %
Lymphs Abs: 1.7 10*3/uL (ref 0.7–4.0)
MCH: 28.1 pg (ref 26.0–34.0)
MCHC: 30.8 g/dL (ref 30.0–36.0)
MCV: 91.4 fL (ref 80.0–100.0)
Monocytes Absolute: 0.9 10*3/uL (ref 0.1–1.0)
Monocytes Relative: 7 %
Neutro Abs: 10.8 10*3/uL — ABNORMAL HIGH (ref 1.7–7.7)
Neutrophils Relative %: 79 %
Platelets: 311 10*3/uL (ref 150–400)
RBC: 3.02 MIL/uL — ABNORMAL LOW (ref 3.87–5.11)
RDW: 19 % — ABNORMAL HIGH (ref 11.5–15.5)
WBC: 13.7 10*3/uL — ABNORMAL HIGH (ref 4.0–10.5)
nRBC: 0 % (ref 0.0–0.2)

## 2020-04-27 LAB — URINE CULTURE

## 2020-04-27 MED ORDER — POTASSIUM CHLORIDE CRYS ER 20 MEQ PO TBCR
40.0000 meq | EXTENDED_RELEASE_TABLET | Freq: Once | ORAL | Status: AC
  Administered 2020-04-27: 40 meq via ORAL

## 2020-04-27 MED ORDER — POTASSIUM CHLORIDE CRYS ER 20 MEQ PO TBCR
20.0000 meq | EXTENDED_RELEASE_TABLET | Freq: Every day | ORAL | Status: DC
  Administered 2020-04-28 – 2020-05-04 (×7): 20 meq via ORAL
  Filled 2020-04-27 (×6): qty 1

## 2020-04-27 MED ORDER — LOPERAMIDE HCL 2 MG PO CAPS: 2.0000 mg | ORAL_CAPSULE | ORAL | Status: DC | PRN

## 2020-04-27 MED ORDER — SODIUM CHLORIDE 0.9 % IV SOLN: 1.0000 g | INTRAVENOUS | Status: DC

## 2020-04-27 MED ORDER — SODIUM CHLORIDE 0.9 % IV SOLN
3.0000 g | Freq: Four times a day (QID) | INTRAVENOUS | Status: DC
  Administered 2020-04-27 – 2020-05-02 (×19): 3 g via INTRAVENOUS
  Filled 2020-04-27 (×2): qty 8
  Filled 2020-04-27 (×2): qty 3
  Filled 2020-04-27: qty 8
  Filled 2020-04-27: qty 3
  Filled 2020-04-27: qty 8
  Filled 2020-04-27: qty 3
  Filled 2020-04-27: qty 8
  Filled 2020-04-27 (×2): qty 3
  Filled 2020-04-27: qty 8
  Filled 2020-04-27: qty 3
  Filled 2020-04-27: qty 8
  Filled 2020-04-27 (×3): qty 3
  Filled 2020-04-27 (×3): qty 8
  Filled 2020-04-27 (×4): qty 3

## 2020-04-27 MED ORDER — AMOXICILLIN-POT CLAVULANATE 875-125 MG PO TABS
1.0000 | ORAL_TABLET | Freq: Two times a day (BID) | ORAL | Status: DC
  Administered 2020-04-27: 1 via ORAL
  Filled 2020-04-27: qty 1

## 2020-04-27 MED ORDER — SODIUM CHLORIDE 0.9 % IV SOLN: 3.0000 g | INTRAVENOUS | Status: DC

## 2020-04-27 NOTE — TOC Progression Note (Signed)
Transition of Care [facility]) - Progression Note    Patient Details  Name: Tina Salinas MRN: [id number] Date of Birth: Apr 29, 1946  Transition of Care [facility]) CM/SW [place], [place] Phone Number: [phone] 04/27/2020, 2:37 PM  Clinical Narrative:     CSW spoke with patient and friend Norma Fredrickson about patient change in status from ED to inpatient.  CSW explained the patient now was assigned to an attending doctor and they would be following her.  Patient wanted to know why she was now inpatient and if she was still going to be placed in a SNF/LTC facility. CSW stated that I had spoken with the EDP and he stated the patient ha. s an UTI infection and that is why her status changed, and yes, she was still going to be placed once she discharged.  Patient verbalized understanding but also dislike of placement.  CSW reminded patient that APS was following her and because she couldn't take care of herself if she went home it would be an unsafe discharge. Patient verbalized understanding.    CSW contacted Raymond Gurney [phone] [facility] DSS/APS with update on patient status.  Expected Discharge Plan: Long Term Nursing Home Barriers to Discharge: ED Patient Insisting on an Alternate Living Situation/Facility  Expected Discharge Plan and Services Expected Discharge Plan: Long Term Nursing Home In-house Referral: Clinical Social Work   Post Acute Care Choice: Nursing Home Living arrangements for the past 2 months: Single Family Home                                       Social Determinants of Health (SDOH) Interventions    Readmission Risk Interventions No flowsheet data found.

## 2020-04-27 NOTE — Consult Note (Addendum)
WOC consult requested for sacrum wound.  This was already performed on 10/11; refer to progress notes for assessment and measurements, and topical treatment orders have been provided for staff nurse use.  Please re-consult if further assistance is needed.  Thank-you,  Julien Girt MSN, [place], [place], [place], [place]

## 2020-04-27 NOTE — Consult Note (Signed)
[facility] SURGICAL CONSULTATION NOTE (initial) - cpt: 24401   HISTORY OF PRESENT ILLNESS (HPI):  74 y.o. female initially presented to [facility] ED on 10/06 with Adult Protective Services secondary to failure to thrive. Patient has a history of cerebral palsy and is primarily bed bound. At the time of presentation, there were concerns from APS in regards to her care at home as she has continually fired those providers and agencies. Primary reason for presentation to the ED was for psychiatric evaluation to determine her capacity to refuse care. She is aware of a large ulceration on her sacrum and that this potentially has been worsening but she is unable to care for this herself. While in the ED, she was managed for a UTI with Keflex and CSW worked on transitioning patient to SNF. She was seen and evaluated by psychiatry on 10/09. She has had multiple evaluations from [facility] RN, most recently on 04/26/2020, which they recommend evaluation for potential debridement. However, on 10/11, she was also found to have lactic acidosis to 2.0 (now resolved), leukocytosis to 12.1K (now 13.7K), persistent hypokalemia to 2.9 (now 3.6), UCx showed multiple species. Given these findings, she was admitted to hospitalist service.   Surgery is consulted by hospitalist physician Dr. Shelly Coss, MD in this context for evaluation and management of sacral ulceration.   PAST MEDICAL HISTORY (PMH):  Past Medical History:  Diagnosis Date  . Adenoma   . CAP (community acquired pneumonia) 11/16/2017  . Cerebral palsy ([place])   . CHF (congestive heart failure) ([place])   . Femur fracture ([place])   . History of blood transfusion   . Hypertension   . Osteoarthritis   . Osteoporosis   . Rheumatoid arthritis ([place])      PAST SURGICAL HISTORY ([place]):  Past Surgical History:  Procedure Laterality Date  . BREAST ENHANCEMENT SURGERY Bilateral 1980  . COLONOSCOPY    . COLONOSCOPY WITH PROPOFOL N/A 06/18/2019    Procedure: COLONOSCOPY WITH PROPOFOL;  Surgeon: Robert Bellow, MD;  Location: [facility] ENDOSCOPY;  Service: Endoscopy;  Laterality: N/A;  . CORONARY ANGIOPLASTY WITH STENT PLACEMENT  07/13/2008, 08/19/2008  . ESOPHAGOGASTRODUODENOSCOPY (EGD) WITH PROPOFOL N/A 06/18/2019   Procedure: ESOPHAGOGASTRODUODENOSCOPY (EGD) WITH PROPOFOL;  Surgeon: Robert Bellow, MD;  Location: [facility] ENDOSCOPY;  Service: Endoscopy;  Laterality: N/A;  . HIP ARTHROPLASTY N/A 2000  . JOINT REPLACEMENT    . LEFT HEART CATH AND CORONARY ANGIOGRAPHY N/A 05/16/2017   Procedure: LEFT HEART CATH AND CORONARY ANGIOGRAPHY;  Surgeon: Teodoro Spray, MD;  Location: [facility];  Service: Cardiovascular;  Laterality: N/A;  . LEFT HEART CATH AND CORONARY ANGIOGRAPHY N/A 04/01/2020   Procedure: LEFT HEART CATH AND CORONARY ANGIOGRAPHY;  Surgeon: Isaias Cowman, MD;  Location: [facility];  Service: Cardiovascular;  Laterality: N/A;  . TOTAL KNEE ARTHROPLASTY Left 09/2007, 01/2008     MEDICATIONS:  Prior to Admission medications   Medication Sig Start Date End Date Taking? Authorizing Provider  alendronate (FOSAMAX) 70 MG tablet Take 70 mg by mouth once a week.  03/30/14  Yes [provider]  amiodarone (PACERONE) 200 MG tablet Take 200 mg by mouth daily.   Yes [provider]  aspirin 81 MG tablet Take 81 mg by mouth daily.   Yes [provider]  calcium-vitamin D (OSCAL WITH D) 500-200 MG-UNIT tablet Take 2 tablets by mouth daily with breakfast.   Yes [provider]  carvedilol (COREG) 3.125 MG tablet Take 3.125 mg by mouth  2 (two) times daily. 03/27/20  Yes [provider]  cholecalciferol (VITAMIN D) 1000 UNITS tablet Take 1,000 Units by mouth daily.  04/25/10  Yes [provider]  docusate sodium (COLACE) 100 MG capsule Take 1 capsule (100 mg total) by mouth daily. 12/05/19  Yes Lavina Hamman, MD  ferrous [id number] C-folic acid (TRINSICON /  FOLTRIN) capsule Take 1 capsule by mouth 2 (two) times daily after a meal. 12/04/19  Yes Lavina Hamman, MD  furosemide (LASIX) 20 MG tablet Take 1 tablet (20 mg total) by mouth daily as needed for fluid or edema (weight gain of 3lbs in 1 day or 5 lbs in 3 days). 12/05/19 12/04/20 Yes Lavina Hamman, MD  HYDROcodone-acetaminophen (NORCO) 7.5-325 MG tablet Take 1-2 tablets by mouth 3 (three) times daily as needed for moderate pain. 02/04/20  Yes Birdie Sons, MD  hydroxychloroquine (PLAQUENIL) 200 MG tablet Take 400 mg by mouth daily.    Yes [provider]  isosorbide mononitrate (IMDUR) 30 MG 24 hr tablet Take 1 tablet (30 mg total) by mouth daily. 04/03/20  Yes Dwyane Dee, MD  latanoprost (XALATAN) 0.005 % ophthalmic solution Place 1 drop into both eyes at bedtime.   Yes [provider]  Methotrexate 25 MG/ML SOSY Inject 0.8 mLs into the muscle every 7 (seven) days. 03/15/20  Yes [provider]  mirtazapine (REMERON) 30 MG tablet Take 1 tablet (30 mg total) by mouth at bedtime. 06/06/19  Yes Birdie Sons, MD  oxybutynin (DITROPAN) 5 MG tablet TAKE 1 TABLET BY MOUTH TWICE DAILY AS NEEDED FOR BLADDER SPASMS 01/20/20  Yes Birdie Sons, MD  pravastatin (PRAVACHOL) 40 MG tablet Take 1 tablet (40 mg total) by mouth at bedtime. 07/23/19  Yes Birdie Sons, MD  predniSONE (DELTASONE) 2.5 MG tablet Take 2.5 mg by mouth daily with breakfast.   Yes [provider]  promethazine (PHENERGAN) 25 MG tablet Take 25 mg by mouth every 6 (six) hours as needed for nausea or vomiting.   Yes [provider]  Vitamin D, Ergocalciferol, (DRISDOL) 1.25 MG (50000 UNIT) CAPS capsule Take 1 capsule (50,000 Units total) by mouth every 7 (seven) days. 11/24/19  Yes Birdie Sons, MD  megestrol (MEGACE) 400 MG/10ML suspension Take 10 mLs (400 mg total) by mouth daily. Patient not taking: Reported on 04/22/2020 02/05/20   Birdie Sons, MD     ALLERGIES:  Allergies   Allergen Reactions  . Etodolac     Other reaction(s): Unknown  . Sulfa Antibiotics   . Tetracycline      SOCIAL HISTORY:  Social History   Socioeconomic History  . Marital status: Widowed    Spouse name: Not on file  . Number of children: Not on file  . Years of education: Not on file  . Highest education level: Not on file  Occupational History  . Not on file  Tobacco Use  . Smoking status: Never Smoker  . Smokeless tobacco: Never Used  Vaping Use  . Vaping Use: Never used  Substance and Sexual Activity  . Alcohol use: No  . Drug use: No  . Sexual activity: Not on file  Other Topics Concern  . Not on file  Social History Narrative  . Not on file   Social Determinants of Health   Financial Resource Strain:   . Difficulty of Paying Living Expenses: Not on file  Food Insecurity:   . Worried About Charity fundraiser in the Last  Year: Not on file  . Ran Out of Food in the Last Year: Not on file  Transportation Needs:   . Lack of Transportation (Medical): Not on file  . Lack of Transportation (Non-Medical): Not on file  Physical Activity:   . Days of Exercise per Week: Not on file  . Minutes of Exercise per Session: Not on file  Stress:   . Feeling of Stress : Not on file  Social Connections:   . Frequency of Communication with Friends and Family: Not on file  . Frequency of Social Gatherings with Friends and Family: Not on file  . Attends Religious Services: Not on file  . Active Member of Clubs or Organizations: Not on file  . Attends Archivist Meetings: Not on file  . Marital Status: Not on file  Intimate Partner Violence:   . Fear of Current or Ex-Partner: Not on file  . Emotionally Abused: Not on file  . Physically Abused: Not on file  . Sexually Abused: Not on file     FAMILY HISTORY:  Family History  Problem Relation Age of Onset  . Alcohol abuse Father   . Raynaud syndrome Sister       REVIEW OF SYSTEMS:  Review of Systems   Constitutional: Negative for chills and fever.  HENT: Negative for congestion and sore throat.   Cardiovascular: Negative for chest pain and palpitations.  Gastrointestinal: Negative for nausea and vomiting.  Genitourinary: Negative for dysuria and urgency.  Skin:       + Sacral Wound, Unstageable   All other systems reviewed and are negative.   VITAL SIGNS:  Pulse Rate:  [57-75] 75 (10/12 1300) Resp:  [16-18] 16 (10/12 1300) BP: (88-129)/(51-79) 129/79 (10/12 1300) SpO2:  [94 %-100 %] 96 % (10/12 1300)     Height: 5' (152.4 cm) Weight: 43 kg BMI (Calculated): 18.51   INTAKE/OUTPUT:  No intake/output data recorded.  PHYSICAL EXAM:  Physical Exam Constitutional:      General: She is not in acute distress.    Appearance: She is normal weight. She is not ill-appearing.     Comments: Very frail, thin appearing female, NAD  HENT:     Head: Normocephalic and atraumatic.     Mouth/Throat:     Mouth: Mucous membranes are moist.     Pharynx: Oropharynx is clear.     Comments: Poor Dentition  Eyes:     Conjunctiva/sclera: Conjunctivae normal.     Pupils: Pupils are equal, round, and reactive to light.  Cardiovascular:     Rate and Rhythm: Normal rate and regular rhythm.     Pulses: Normal pulses.     Heart sounds: No murmur heard.   Pulmonary:     Effort: Pulmonary effort is normal. No respiratory distress.  Abdominal:     General: Abdomen is flat.     Tenderness: There is no abdominal tenderness. There is no guarding or rebound.  Musculoskeletal:     Comments: Chronic contracture and atrophy secondary to known history of cerebral palsy   Skin:    General: Skin is warm and dry.     Findings: Wound present.       Neurological:     General: No focal deficit present.     Mental Status: She is alert and oriented to person, place, and time.  Psychiatric:        Mood and Affect: Mood normal.        Behavior: Behavior normal.    Sacral  wound (04/27/2020):      Labs:   CBC Latest Ref Rng & Units 04/27/2020 04/26/2020 04/21/2020  WBC 4.0 - 10.5 K/uL 13.7(H) 12.1(H) 7.9  Hemoglobin 12.0 - 15.0 g/dL 8.5(L) 9.6(L) 10.4(L)  Hematocrit 36 - 46 % 27.6(L) 31.5(L) 33.8(L)  Platelets 150 - 400 K/uL 311 285 421(H)   CMP Latest Ref Rng & Units 04/27/2020 04/26/2020 04/21/2020  Glucose 70 - 99 mg/dL 94 125(H) 125(H)  BUN 8 - 23 mg/dL 13 18 14   Creatinine 0.44 - 1.00 mg/dL 0.66 0.78 0.78  Sodium 135 - 145 mmol/L 139 140 139  Potassium 3.5 - 5.1 mmol/L 3.6 2.9(L) 3.4(L)  Chloride 98 - 111 mmol/L 110 107 108  CO2 22 - 32 mmol/L 20(L) 25 22  Calcium 8.9 - 10.3 mg/dL 7.7(L) 8.3(L) 7.3(L)  Total Protein 6.5 - 8.1 g/dL - 5.5(L) 5.8(L)  Total Bilirubin 0.3 - 1.2 mg/dL - 0.5 0.6  Alkaline Phos 38 - 126 U/L - 59 63  AST 15 - 41 U/L - 22 45(H)  ALT 0 - 44 U/L - 14 18     Imaging studies:  No new pertinent imaging studiea   Assessment/Plan: (ICD-10's: L89.150) 74 y.o. female with large unstageable sacral ulceration, complicated by pertinent comorbidities including debilitation and limited mobility at baseline secondary to cerebral palsy.   - Will tentatively plan for incision and debridement in the OR tomorrow afternoon with Dr Hampton Abbot pending OR/Anesthesia availability. Despite her comorbid conditions, I do not think she will be able to tolerate any bedside procedures.   - All risks, benefits, and alternatives to above procedure(s) were discussed with the patient, all of her questions were answered to her expressed satisfaction, patient expresses she wishes to proceed, and informed consent was obtained.    - NPO after midnight  - Continue ABx  - Local wound care: Continue local dressings, frequent repositioning, low air loss mattress   - Pain control prn   - DVT prophylaxis; hold at midnight  - Further management per primary service   All of the above findings and recommendations were discussed with the patient, and all of patient's questions were answered to her  expressed satisfaction.  Thank you for the opportunity to participate in this patient's care.   -- Edison Simon, PA-C [facility] 04/27/2020, 2:18 PM [phone] M-F: 7am - 4pm

## 2020-04-27 NOTE — ED Notes (Signed)
Pt given breakfast tray. Pt resting at this time.

## 2020-04-27 NOTE — ED Notes (Signed)
Pt resting in air bed with TV on

## 2020-04-27 NOTE — Care Management Obs Status (Signed)
[place] NOTIFICATION   Patient Details  Name: BELANNA MANRING MRN: [id number] Date of Birth: 01/06/1946   Medicare Observation Status Notification Given:  Yes (Reviewed with financial POA over the phone. Will print to have put on the front of her chart.)    Candie Chroman, LCSW 04/27/2020, 3:57 PM

## 2020-04-27 NOTE — Progress Notes (Signed)
PROGRESS NOTE    Tina Salinas  OZD:[id number] DOB: [birth year] DOA: 04/21/2020 PCP: Birdie Sons, MD   Chief Complain: Weakness, placement issue  Brief Narrative:  Patient is a 74 y.o. female with medical history significant of cerebral palsy, congestive heart failure, hypertension, rheumatoid arthritis, hyperlipidemia, proximal A. fib who was brought from home with complaints of generalized weakness, failure to thrive and psychiatric evaluation.  She is bedbound at baseline and is under Adult Scientist, forensic.  She is cared by home health aides but recently started firing them and denied their service.  Patient was in the emergency department for last few days waiting for placement in a skilled nursing facility.  During her stay in ED, ED physicians suspected that she developed sepsis secondary to UTI and requested hospital admission.  Currently she is waiting for placement.SW following.  Assessment & Plan:   Active Problems:   Adjustment disorder with depressed mood   Infantile cerebral palsy (HCC)   Chronic combined systolic and diastolic CHF (congestive heart failure) (HCC)   Malnutrition of moderate degree   Hypokalemia   Pressure injury of skin   Sepsis ([place])   Suspected Sepsis/urinary tract infection: Blood pressure was soft, lactate of 2 and had leukocytosis.  Urine culture sent few days ago showed multiple species.  Repeat urine culture showed same.  Urinalysis was suggestive of  UTI.  She also has some hematuria as per UA, but her urine is grossly clear. Patient denies dysuria.  She is afebrile.    We will discontinue ceftriaxone .  Blood cultures sent few days ago did not show any growth.  Still has mild leukocytosis.  Stage III sacral ulcer: Another possible source of sepsis.  Has a chronic sacral ulcer with eschar .  Continue wound care, wound care nurse has been following.    As per the wound nurse evaluation, there was some foul smelling discharge.  I did not see any  drainage from the wound during my evaluation.  As per the wound nurse, I have requested for general surgery evaluation if she is a candidate for debridement.  I will continue  Augmentin for now.  Severe hypokalemia:  Supplemented and corrected. Most likely associated with diarrhea.  Diarrhea: Has been having diarrhea since last few days.    Negative GI pathogen panel.  Denies any abdominal pain.  Abdomen is soft and nondistended and nontender.Imodium PRN  Severe systolic congestive heart failure: Currently euvolemic or dehydrated.  Echocardiogram done on 03/31/2020 showed ejection fraction of less than 20%.  On Lasix at home which is on hold due to soft blood pressure and  diarrhea.  Coronary artery disease: Cardiac cath done on 9/21 showed nonobstructive coronary artery disease. On aspirin  History of paroxysmal A. fib: Currently in normal sinus rhythm.  On amiodarone.  On carvedilol for rate control, currently on hold.  Rate is controlled.  Hypertension: Blood pressure is soft. Antihypertensives on hold.  Given gentle IV fluids.  History of rheumatoid arthritis: On prednisone, methotrexate, hydroxychloroquine.  Continue prednisone and hydroxychloroquine.  Adjustment disorder with depressed mood: Started on Celexa during this hospitalization.  Continue Remeron.  Psychiatry seen the patient and did not recommend inpatient psychiatric admission.  Hyperlipidemia: On pravastatin  Infantile cerebral palsy: Bedbound .  Has contractures on the lower extremities.  Continue supportive care.  Debility/deconditioning/placement issues: Education officer, museum closely following and trying to arrange skilled facility placement.  She is on under Adult Scientist, forensic.  We will continue to communicate with social  worker for placement decision.          DVT prophylaxis:Lovenox Code Status: Full Family Communication: Friend present at the bedside Status is: Observation   Dispo: The patient is  from: Home              Anticipated d/c is to: SNF              Anticipated d/c date is: 2 days              Patient currently is not medically stable for discharge Awaiting general surgery evaluation for the wound   Consultants: None  Procedures: None  Antimicrobials:  Anti-infectives (From admission, onward)   Start     Dose/Rate Route Frequency Ordered Stop   04/27/20 1000  cefTRIAXone (ROCEPHIN) 1 g in sodium chloride 0.9 % 100 mL IVPB        1 g 200 mL/hr over 30 Minutes Intravenous Every 24 hours 04/26/20 1729     04/26/20 1645  cefTRIAXone (ROCEPHIN) 1 g in sodium chloride 0.9 % 100 mL IVPB        1 g 200 mL/hr over 30 Minutes Intravenous  Once 04/26/20 1636 04/26/20 1734   04/22/20 1000  hydroxychloroquine (PLAQUENIL) tablet 400 mg        400 mg Oral Daily 04/22/20 0209     04/21/20 2345  cephALEXin (KEFLEX) capsule 250 mg  Status:  Discontinued        250 mg Oral Every 8 hours 04/21/20 2343 04/26/20 1729      Subjective: Patient seen and examined at the bedside this morning.  Hemodynamically stable during my evaluation.  Not in any kind of distress or discomfort.  Denies any complaints today.  Friend was at the bedside.  Objective: Vitals:   04/27/20 0500 04/27/20 0600 04/27/20 0700 04/27/20 0800  BP: 121/63 110/67 (!) 108/54 (!) 106/55  Pulse: 65 65 65 65  Resp: 18 18    Temp:      TempSrc:      SpO2: 96% 97% 99% 100%  Weight:      Height:       No intake or output data in the 24 hours ending 04/27/20 0819 Filed Weights   04/21/20 1238  Weight: 43 kg    Examination:  General exam: Appears calm and comfortable ,Not in distress, very deconditioned, debilitated, chronically looking HEENT:PERRL,Oral mucosa moist, Ear/Nose normal on gross exam Respiratory system: Bilateral equal air entry, normal vesicular breath sounds, no wheezes or crackles  Cardiovascular system: S1 & S2 heard, RRR. No JVD, murmurs, rubs, gallops or clicks. No pedal  edema. Gastrointestinal system: Abdomen is nondistended, soft and nontender. No organomegaly or masses felt. Normal bowel sounds heard. Central nervous system: Alert and awake oriented to place.  Tells correct year Extremities: Bilateral lower extremities strictures, bilateral foot wrapped Skin: Stage III sacral ulcer with eschar    Data Reviewed: I have personally reviewed following labs and imaging studies  CBC: Recent Labs  Lab 04/21/20 1311 04/26/20 1041 04/27/20 0628  WBC 7.9 12.1* 13.7*  NEUTROABS 6.5 10.1* 10.8*  HGB 10.4* 9.6* 8.5*  HCT 33.8* 31.5* 27.6*  MCV 90.1 91.3 91.4  PLT 421* 285 417   Basic Metabolic Panel: Recent Labs  Lab 04/21/20 1311 04/26/20 1041 04/27/20 0628  NA 139 140 139  K 3.4* 2.9* 3.6  CL 108 107 110  CO2 22 25 20*  GLUCOSE 125* 125* 94  BUN 14 18 13   CREATININE 0.78 0.78 0.66  CALCIUM 7.3* 8.3* 7.7*  MG  --  1.9  --    GFR: Estimated Creatinine Clearance: 43.1 mL/min (by C-G formula based on SCr of 0.66 mg/dL). Liver Function Tests: Recent Labs  Lab 04/21/20 1311 04/26/20 1041  AST 45* 22  ALT 18 14  ALKPHOS 63 59  BILITOT 0.6 0.5  PROT 5.8* 5.5*  ALBUMIN 2.2* 2.2*   No results for input(s): LIPASE, AMYLASE in the last 168 hours. No results for input(s): AMMONIA in the last 168 hours. Coagulation Profile: No results for input(s): INR, PROTIME in the last 168 hours. Cardiac Enzymes: No results for input(s): CKTOTAL, CKMB, CKMBINDEX, TROPONINI in the last 168 hours. BNP (last 3 results) No results for input(s): PROBNP in the last 8760 hours. HbA1C: No results for input(s): HGBA1C in the last 72 hours. CBG: No results for input(s): GLUCAP in the last 168 hours. Lipid Profile: No results for input(s): CHOL, HDL, LDLCALC, TRIG, CHOLHDL, LDLDIRECT in the last 72 hours. Thyroid Function Tests: No results for input(s): TSH, T4TOTAL, FREET4, T3FREE, THYROIDAB in the last 72 hours. Anemia Panel: No results for input(s):  VITAMINB12, FOLATE, FERRITIN, TIBC, IRON, RETICCTPCT in the last 72 hours. Sepsis Labs: Recent Labs  Lab 04/21/20 1311 04/21/20 1510 04/26/20 1531 04/26/20 2056  LATICACIDVEN 2.0* 0.8 2.0* 1.1    Recent Results (from the past 240 hour(s))  Blood culture (routine single)     Status: None   Collection Time: 04/21/20  1:00 PM   Specimen: BLOOD  Result Value Ref Range Status   Specimen Description BLOOD BLOOD RIGHT FOREARM  Final   Special Requests   Final    BOTTLES DRAWN AEROBIC AND ANAEROBIC Blood Culture results may not be optimal due to an inadequate volume of blood received in culture bottles   Culture   Final    NO GROWTH 5 DAYS Performed at [facility], [address] [place], [place] [address]    Report Status 04/26/2020 FINAL  Final  Urine culture     Status: Abnormal   Collection Time: 04/21/20 11:00 PM   Specimen: In/Out Cath Urine  Result Value Ref Range Status   Specimen Description   Final    IN/OUT CATH URINE Performed at [facility], [address] [place], [place] [address]    Special Requests   Final    NONE Performed at [facility], [address] [place], [place] [address]    Culture MULTIPLE SPECIES PRESENT, SUGGEST RECOLLECTION (A)  Final   Report Status 04/22/2020 FINAL  Final  Gastrointestinal Panel by PCR , Stool     Status: None   Collection Time: 04/26/20  2:58 PM   Specimen: Stool  Result Value Ref Range Status   Campylobacter species NOT DETECTED NOT DETECTED Final   Plesimonas shigelloides NOT DETECTED NOT DETECTED Final   Salmonella species NOT DETECTED NOT DETECTED Final   Yersinia enterocolitica NOT DETECTED NOT DETECTED Final   Vibrio species NOT DETECTED NOT DETECTED Final   Vibrio cholerae NOT DETECTED NOT DETECTED Final   Enteroaggregative E coli (EAEC) NOT DETECTED NOT DETECTED Final   Enteropathogenic E coli (EPEC) NOT DETECTED NOT DETECTED Final   Enterotoxigenic E coli (ETEC) NOT DETECTED NOT  DETECTED Final   Shiga like toxin producing E coli (STEC) NOT DETECTED NOT DETECTED Final   Shigella/Enteroinvasive E coli (EIEC) NOT DETECTED NOT DETECTED Final   Cryptosporidium NOT DETECTED NOT DETECTED Final   Cyclospora cayetanensis NOT DETECTED NOT DETECTED Final  Entamoeba histolytica NOT DETECTED NOT DETECTED Final   Giardia lamblia NOT DETECTED NOT DETECTED Final   Adenovirus F40/41 NOT DETECTED NOT DETECTED Final   Astrovirus NOT DETECTED NOT DETECTED Final   Norovirus GI/GII NOT DETECTED NOT DETECTED Final   Rotavirus A NOT DETECTED NOT DETECTED Final   Sapovirus (I, II, IV, and V) NOT DETECTED NOT DETECTED Final    Comment: Performed at [facility], [address] [place], [place] [address]  Respiratory Panel by RT PCR (Flu A&B, Covid) - Nasopharyngeal Swab     Status: None   Collection Time: 04/26/20  3:51 PM   Specimen: Nasopharyngeal Swab  Result Value Ref Range Status   SARS Coronavirus 2 by RT PCR NEGATIVE NEGATIVE Final    Comment: (NOTE) SARS-CoV-2 target nucleic acids are NOT DETECTED.  The SARS-CoV-2 RNA is generally detectable in upper respiratoy specimens during the acute phase of infection. The lowest concentration of SARS-CoV-2 viral copies this assay can detect is 131 copies/mL. A negative result does not preclude SARS-Cov-2 infection and should not be used as the sole basis for treatment or other patient management decisions. A negative result may occur with  improper specimen collection/handling, submission of specimen other than nasopharyngeal swab, presence of viral mutation(s) within the areas targeted by this assay, and inadequate number of viral copies (<131 copies/mL). A negative result must be combined with clinical observations, patient history, and epidemiological information. The expected result is Negative.  Fact Sheet for Patients:  PinkCheek.be  Fact Sheet for Healthcare Providers:   GravelBags.it  This test is no t yet approved or cleared by the [place] FDA and  has been authorized for detection and/or diagnosis of SARS-CoV-2 by FDA under an Emergency Use Authorization (EUA). This EUA will remain  in effect (meaning this test can be used) for the duration of the COVID-19 declaration under Section 564(b)(1) of the Act, 21 U.S.C. section 360bbb-3(b)(1), unless the authorization is terminated or revoked sooner.     Influenza A by PCR NEGATIVE NEGATIVE Final   Influenza B by PCR NEGATIVE NEGATIVE Final    Comment: (NOTE) The Xpert Xpress SARS-CoV-2/FLU/RSV assay is intended as an aid in  the diagnosis of influenza from Nasopharyngeal swab specimens and  should not be used as a sole basis for treatment. Nasal washings and  aspirates are unacceptable for Xpert Xpress SARS-CoV-2/FLU/RSV  testing.  Fact Sheet for Patients: PinkCheek.be  Fact Sheet for Healthcare Providers: GravelBags.it  This test is not yet approved or cleared by the [place] FDA and  has been authorized for detection and/or diagnosis of SARS-CoV-2 by  FDA under an Emergency Use Authorization (EUA). This EUA will remain  in effect (meaning this test can be used) for the duration of the  Covid-19 declaration under Section 564(b)(1) of the Act, 21  U.S.C. section 360bbb-3(b)(1), unless the authorization is  terminated or revoked. Performed at [facility], [address] [place], [place] [address]          Radiology Studies: No results found.      Scheduled Meds: . amiodarone  200 mg Oral Daily  . aspirin EC  81 mg Oral Daily  . calcium-vitamin D  2 tablet Oral Q breakfast  . cholecalciferol  1,000 Units Oral Daily  . citalopram  10 mg Oral Daily  . collagenase   Topical Daily  . docusate sodium  100 mg Oral Daily  . enoxaparin (LOVENOX) injection  30 mg Subcutaneous Q24H   . ferrous  [id number] C-folic acid  1 capsule Oral BID PC  . hydroxychloroquine  400 mg Oral Daily  . latanoprost  1 drop Both Eyes QHS  . megestrol  400 mg Oral Daily  . mirtazapine  15 mg Oral QHS  . potassium chloride  40 mEq Oral BID  . pravastatin  40 mg Oral QHS  . predniSONE  2.5 mg Oral Q breakfast   Continuous Infusions: . sodium chloride 75 mL/hr at 04/26/20 1826  . cefTRIAXone (ROCEPHIN)  IV       LOS: 0 days    Time spent:25 mins. More than 50% of that time was spent in counseling and/or coordination of care.      Shelly Coss, MD [facility] P10/06/2020, 8:19 AM

## 2020-04-27 NOTE — Consult Note (Addendum)
Pharmacy Antibiotic Note  Tina Salinas is a 74 y.o. female admitted on 04/21/2020 with wound infection.  Pharmacy has been consulted for Unasyn dosing. Plans for the OR tomorrow for debridement.    Antibiotics for today (last dose):  Augmentin 875-125 mg@ 1735 Ceftriaxone 1g @ 1055  Plan: Unasyn 3g Q6H - will start at 0000 tomorrow given worsening leukocytosis today, unstable decubitus wound, and foul smelling wound (per notes).   Height: 5' (152.4 cm) Weight: 43 kg (94 lb 12.8 oz) IBW/kg (Calculated) : 45.5  Temp (24hrs), Avg:97.8 F (36.6 C), Min:97.8 F (36.6 C), Max:97.8 F (36.6 C)  Recent Labs  Lab 04/21/20 1311 04/21/20 1510 04/26/20 1041 04/26/20 1531 04/26/20 2056 04/27/20 0628  WBC 7.9  --  12.1*  --   --  13.7*  CREATININE 0.78  --  0.78  --   --  0.66  LATICACIDVEN 2.0* 0.8  --  2.0* 1.1  --     Estimated Creatinine Clearance: 43.1 mL/min (by C-G formula based on SCr of 0.66 mg/dL).    Allergies  Allergen Reactions  . Etodolac     Other reaction(s): Unknown  . Sulfa Antibiotics   . Tetracycline     Antimicrobials this admission: 10/12 Unasyn >>  Dose adjustments this admission:   Microbiology results: BCx:  UCx:   Sputum:  MRSA PCR:   Thank you for allowing pharmacy to be a part of this patient's care.  Rowland Lathe 04/27/2020 5:55 PM

## 2020-04-28 ENCOUNTER — Inpatient Hospital Stay: Payer: Medicare HMO | Admitting: Certified Registered"

## 2020-04-28 ENCOUNTER — Inpatient Hospital Stay: Payer: Medicare HMO

## 2020-04-28 ENCOUNTER — Encounter
Admission: EM | Disposition: A | Discharge status: Hospice | Payer: Medicare HMO | Source: Home / Self Care | Attending: Internal Medicine

## 2020-04-28 ENCOUNTER — Encounter: Payer: Self-pay | Admitting: Internal Medicine

## 2020-04-28 DIAGNOSIS — N390 Urinary tract infection, site not specified: Secondary | ICD-10-CM

## 2020-04-28 DIAGNOSIS — R531 Weakness: Secondary | ICD-10-CM

## 2020-04-28 DIAGNOSIS — Z792 Long term (current) use of antibiotics: Secondary | ICD-10-CM | POA: Diagnosis not present

## 2020-04-28 DIAGNOSIS — F4321 Adjustment disorder with depressed mood: Secondary | ICD-10-CM | POA: Diagnosis not present

## 2020-04-28 DIAGNOSIS — I5042 Chronic combined systolic (congestive) and diastolic (congestive) heart failure: Secondary | ICD-10-CM

## 2020-04-28 DIAGNOSIS — E876 Hypokalemia: Secondary | ICD-10-CM

## 2020-04-28 HISTORY — PX: INCISION AND DRAINAGE ABSCESS: SHX5864

## 2020-04-28 LAB — CBC WITH DIFFERENTIAL/PLATELET
Abs Immature Granulocytes: 0.06 10*3/uL (ref 0.00–0.07)
Basophils Absolute: 0 10*3/uL (ref 0.0–0.1)
Basophils Relative: 0 %
Eosinophils Absolute: 0.2 10*3/uL (ref 0.0–0.5)
Eosinophils Relative: 2 %
HCT: 27.6 % — ABNORMAL LOW (ref 36.0–46.0)
Hemoglobin: 8.3 g/dL — ABNORMAL LOW (ref 12.0–15.0)
Immature Granulocytes: 1 %
Lymphocytes Relative: 19 %
Lymphs Abs: 1.6 10*3/uL (ref 0.7–4.0)
MCH: 27.8 pg (ref 26.0–34.0)
MCHC: 30.1 g/dL (ref 30.0–36.0)
MCV: 92.3 fL (ref 80.0–100.0)
Monocytes Absolute: 0.7 10*3/uL (ref 0.1–1.0)
Monocytes Relative: 8 %
Neutro Abs: 6.2 10*3/uL (ref 1.7–7.7)
Neutrophils Relative %: 70 %
Platelets: 293 10*3/uL (ref 150–400)
RBC: 2.99 MIL/uL — ABNORMAL LOW (ref 3.87–5.11)
RDW: 19.3 % — ABNORMAL HIGH (ref 11.5–15.5)
WBC: 8.7 10*3/uL (ref 4.0–10.5)
nRBC: 0 % (ref 0.0–0.2)

## 2020-04-28 LAB — BASIC METABOLIC PANEL
Anion gap: 7 (ref 5–15)
BUN: 10 mg/dL (ref 8–23)
CO2: 20 mmol/L — ABNORMAL LOW (ref 22–32)
Calcium: 7.5 mg/dL — ABNORMAL LOW (ref 8.9–10.3)
Chloride: 115 mmol/L — ABNORMAL HIGH (ref 98–111)
Creatinine, Ser: 0.64 mg/dL (ref 0.44–1.00)
GFR, Estimated: >60 mL/min (ref >60)
Glucose, Bld: 79 mg/dL (ref 70–99)
Potassium: 2.9 mmol/L — ABNORMAL LOW (ref 3.5–5.1)
Sodium: 142 mmol/L (ref 135–145)

## 2020-04-28 LAB — MAGNESIUM: Magnesium: 1.7 mg/dL (ref 1.7–2.4)

## 2020-04-28 SURGERY — INCISION AND DRAINAGE, ABSCESS
Anesthesia: General

## 2020-04-28 MED ORDER — FENTANYL CITRATE (PF) 250 MCG/5ML IJ SOLN
INTRAMUSCULAR | Status: AC
  Filled 2020-04-28: qty 5

## 2020-04-28 MED ORDER — DEXAMETHASONE SODIUM PHOSPHATE 10 MG/ML IJ SOLN
INTRAMUSCULAR | Status: DC | PRN
  Administered 2020-04-28: 10 mg via INTRAVENOUS

## 2020-04-28 MED ORDER — POTASSIUM CHLORIDE CRYS ER 20 MEQ PO TBCR
40.0000 meq | EXTENDED_RELEASE_TABLET | Freq: Once | ORAL | Status: DC
  Filled 2020-04-28: qty 2

## 2020-04-28 MED ORDER — BUPIVACAINE HCL (PF) 0.5 % IJ SOLN
INTRAMUSCULAR | Status: AC
  Filled 2020-04-28: qty 30

## 2020-04-28 MED ORDER — DEXAMETHASONE SODIUM PHOSPHATE 10 MG/ML IJ SOLN
INTRAMUSCULAR | Status: AC
  Filled 2020-04-28: qty 1

## 2020-04-28 MED ORDER — BUPIVACAINE LIPOSOME 1.3 % IJ SUSP
INTRAMUSCULAR | Status: DC | PRN
  Administered 2020-04-28: 20 mL

## 2020-04-28 MED ORDER — ACETAMINOPHEN 10 MG/ML IV SOLN
INTRAVENOUS | Status: AC
  Filled 2020-04-28: qty 100

## 2020-04-28 MED ORDER — BUPIVACAINE-EPINEPHRINE (PF) 0.25% -1:200000 IJ SOLN
INTRAMUSCULAR | Status: AC
  Filled 2020-04-28: qty 30

## 2020-04-28 MED ORDER — ONDANSETRON HCL 4 MG/2ML IJ SOLN
INTRAMUSCULAR | Status: AC
  Filled 2020-04-28: qty 2

## 2020-04-28 MED ORDER — LIDOCAINE HCL (CARDIAC) PF 100 MG/5ML IV SOSY
PREFILLED_SYRINGE | INTRAVENOUS | Status: DC | PRN
  Administered 2020-04-28: 30 mg via INTRAVENOUS

## 2020-04-28 MED ORDER — ONDANSETRON HCL 4 MG/2ML IJ SOLN: 4.0000 mg | Freq: Once | INTRAMUSCULAR | Status: DC | PRN

## 2020-04-28 MED ORDER — SUCCINYLCHOLINE CHLORIDE 20 MG/ML IJ SOLN
INTRAMUSCULAR | Status: DC | PRN
  Administered 2020-04-28: 80 mg via INTRAVENOUS

## 2020-04-28 MED ORDER — VASOPRESSIN 20 UNIT/ML IV SOLN
INTRAVENOUS | Status: DC | PRN
  Administered 2020-04-28: 2 [IU] via INTRAVENOUS
  Administered 2020-04-28: 1 [IU] via INTRAVENOUS
  Administered 2020-04-28: 3 [IU] via INTRAVENOUS

## 2020-04-28 MED ORDER — DAKINS (1/4 STRENGTH) 0.125 % EX SOLN
CUTANEOUS | Status: AC
  Filled 2020-04-28: qty 473

## 2020-04-28 MED ORDER — POTASSIUM CHLORIDE 10 MEQ/100ML IV SOLN
10.0000 meq | INTRAVENOUS | Status: DC
  Administered 2020-04-28: 10 meq via INTRAVENOUS
  Filled 2020-04-28 (×2): qty 100

## 2020-04-28 MED ORDER — LIDOCAINE HCL (PF) 2 % IJ SOLN
INTRAMUSCULAR | Status: AC
  Filled 2020-04-28: qty 5

## 2020-04-28 MED ORDER — BUPIVACAINE-EPINEPHRINE (PF) 0.25% -1:200000 IJ SOLN
INTRAMUSCULAR | Status: DC | PRN
  Administered 2020-04-28: 30 mL

## 2020-04-28 MED ORDER — FENTANYL CITRATE (PF) 100 MCG/2ML IJ SOLN
INTRAMUSCULAR | Status: DC | PRN
Start: 2020-04-28 — End: 2020-04-28
  Administered 2020-04-28: 100 ug via INTRAVENOUS
  Administered 2020-04-28: 50 ug via INTRAVENOUS

## 2020-04-28 MED ORDER — OCUVITE-LUTEIN PO CAPS
1.0000 | ORAL_CAPSULE | Freq: Every day | ORAL | Status: DC
  Administered 2020-04-29 – 2020-05-07 (×7): 1 via ORAL
  Filled 2020-04-28 (×9): qty 1

## 2020-04-28 MED ORDER — ETOMIDATE 2 MG/ML IV SOLN
INTRAVENOUS | Status: DC | PRN
  Administered 2020-04-28: 12 mg via INTRAVENOUS

## 2020-04-28 MED ORDER — ASCORBIC ACID 500 MG PO TABS
250.0000 mg | ORAL_TABLET | Freq: Two times a day (BID) | ORAL | Status: DC
  Administered 2020-04-29 – 2020-05-07 (×15): 250 mg via ORAL
  Filled 2020-04-28 (×2): qty 1
  Filled 2020-04-28: qty 0.5
  Filled 2020-04-28 (×13): qty 1

## 2020-04-28 MED ORDER — ONDANSETRON HCL 4 MG/2ML IJ SOLN
INTRAMUSCULAR | Status: DC | PRN
  Administered 2020-04-28: 4 mg via INTRAVENOUS

## 2020-04-28 MED ORDER — EPINEPHRINE PF 1 MG/ML IJ SOLN
INTRAMUSCULAR | Status: AC
  Filled 2020-04-28: qty 1

## 2020-04-28 MED ORDER — ACETAMINOPHEN 10 MG/ML IV SOLN
INTRAVENOUS | Status: DC | PRN
  Administered 2020-04-28: 1000 mg via INTRAVENOUS

## 2020-04-28 MED ORDER — FENTANYL CITRATE (PF) 100 MCG/2ML IJ SOLN: 25.0000 ug | INTRAMUSCULAR | Status: DC | PRN

## 2020-04-28 MED ORDER — ETOMIDATE 2 MG/ML IV SOLN
INTRAVENOUS | Status: AC
  Filled 2020-04-28: qty 10

## 2020-04-28 MED ORDER — EPHEDRINE SULFATE 50 MG/ML IJ SOLN
INTRAMUSCULAR | Status: DC | PRN
  Administered 2020-04-28: 10 mg via INTRAVENOUS
  Administered 2020-04-28 (×2): 5 mg via INTRAVENOUS

## 2020-04-28 MED ORDER — BUPIVACAINE LIPOSOME 1.3 % IJ SUSP
INTRAMUSCULAR | Status: AC
  Filled 2020-04-28: qty 20

## 2020-04-28 MED ORDER — PHENYLEPHRINE HCL (PRESSORS) 10 MG/ML IV SOLN
INTRAVENOUS | Status: DC | PRN
  Administered 2020-04-28: 200 ug via INTRAVENOUS
  Administered 2020-04-28 (×2): 100 ug via INTRAVENOUS
  Administered 2020-04-28 (×2): 200 ug via INTRAVENOUS
  Administered 2020-04-28: 100 ug via INTRAVENOUS
  Administered 2020-04-28: 200 ug via INTRAVENOUS

## 2020-04-28 MED ORDER — BUPIVACAINE-EPINEPHRINE (PF) 0.5% -1:200000 IJ SOLN
INTRAMUSCULAR | Status: AC
  Filled 2020-04-28: qty 60

## 2020-04-28 MED ORDER — ROCURONIUM BROMIDE 10 MG/ML (PF) SYRINGE
PREFILLED_SYRINGE | INTRAVENOUS | Status: AC
  Filled 2020-04-28: qty 10

## 2020-04-28 MED ORDER — PROPOFOL 10 MG/ML IV BOLUS
INTRAVENOUS | Status: AC
  Filled 2020-04-28: qty 20

## 2020-04-28 SURGICAL SUPPLY — 32 items
BNDG GAUZE 4.5X4.1 6PLY STRL (MISCELLANEOUS) IMPLANT
CANISTER SUCT 1200ML W/VALVE (MISCELLANEOUS) IMPLANT
CANISTER WOUND CARE 500ML ATS (WOUND CARE) ×3 IMPLANT
CHLORAPREP W/TINT 26 (MISCELLANEOUS) IMPLANT
CNTNR SPEC 2.5X3XGRAD LEK (MISCELLANEOUS) ×1
CONT SPEC 4OZ STER OR WHT (MISCELLANEOUS) ×2
CONTAINER SPEC 2.5X3XGRAD LEK (MISCELLANEOUS) ×1 IMPLANT
COVER WAND RF STERILE (DRAPES) ×3 IMPLANT
DRAIN PENROSE 1/4X12 LTX STRL (WOUND CARE) IMPLANT
DRAPE LAPAROTOMY 77X122 PED (DRAPES) ×3 IMPLANT
DRSG GAUZE FLUFF 36X18 (GAUZE/BANDAGES/DRESSINGS) IMPLANT
DRSG VAC ATS LRG SENSATRAC (GAUZE/BANDAGES/DRESSINGS) ×3 IMPLANT
ELECT REM PT RETURN 9FT ADLT (ELECTROSURGICAL) ×3
ELECTRODE REM PT RTRN 9FT ADLT (ELECTROSURGICAL) ×1 IMPLANT
GAUZE SPONGE 4X4 12PLY STRL (GAUZE/BANDAGES/DRESSINGS) IMPLANT
GLOVE SURG SYN 7.0 (GLOVE) ×9 IMPLANT
GLOVE SURG SYN 7.5  E (GLOVE) ×6
GLOVE SURG SYN 7.5 E (GLOVE) ×3 IMPLANT
GOWN STRL REUS W/ TWL LRG LVL3 (GOWN DISPOSABLE) ×3 IMPLANT
GOWN STRL REUS W/TWL LRG LVL3 (GOWN DISPOSABLE) ×6
KIT TURNOVER KIT A (KITS) ×3 IMPLANT
LABEL OR SOLS (LABEL) ×3 IMPLANT
NEEDLE HYPO 22GX1.5 SAFETY (NEEDLE) ×3 IMPLANT
NS IRRIG 500ML POUR BTL (IV SOLUTION) ×3 IMPLANT
PACK BASIN MINOR (MISCELLANEOUS) ×3 IMPLANT
PAD ABD DERMACEA PRESS 5X9 (GAUZE/BANDAGES/DRESSINGS) IMPLANT
SOL PREP PVP 2OZ (MISCELLANEOUS) ×3
SOLUTION PREP PVP 2OZ (MISCELLANEOUS) ×1 IMPLANT
SPONGE LAP 18X18 RF (DISPOSABLE) ×3 IMPLANT
SWAB CULTURE AMIES ANAERIB BLU (MISCELLANEOUS) IMPLANT
SYR 20ML LL LF (SYRINGE) ×3 IMPLANT
SYR BULB IRRIG 60ML STRL (SYRINGE) ×3 IMPLANT

## 2020-04-28 NOTE — Progress Notes (Signed)
PROGRESS NOTE    Tina Salinas  GXQ:[id number] DOB: 01-30-1946 DOA: 04/21/2020 PCP: Birdie Sons, MD   Chief Complain: Weakness, placement issue   Subjective: The patient was seen and examined this morning, stable, complained of generalized aches and pain Reluctant to move due to pain and discomfort.  Patient is aware of the plan of surgery-I&D and debridement of the sacral decubitus today   Brief Narrative:  Patient is a 74 y.o. female with medical history significant of cerebral palsy, congestive heart failure, hypertension, rheumatoid arthritis, hyperlipidemia, proximal A. fib who was brought from home with complaints of generalized weakness, failure to thrive and psychiatric evaluation.  She is bedbound at baseline and is under Adult Scientist, forensic.  She is cared by home health aides but recently started firing them and denied their service.  Patient was in the emergency department for last few days waiting for placement in a skilled nursing facility.  During her stay in ED, ED physicians suspected that she developed sepsis secondary to UTI and requested hospital admission.  Currently she is waiting for placement.SW following.  Assessment & Plan:   Active Problems:   Adjustment disorder with depressed mood   Infantile cerebral palsy (HCC)   Chronic combined systolic and diastolic CHF (congestive heart failure) (HCC)   Malnutrition of moderate degree   Hypokalemia   Pressure injury of skin   Sepsis ([place])   Suspected Sepsis/urinary tract infection:  -Improved sepsis physiology -Hemodynamically stable this morning -Blood pressure remains soft 105/58,  -Lactic acid 2.0 >> 1.1 -WBC 13.7 >> 8.7   Urine culture sent few days ago showed multiple species.  Repeat urine culture showed same.  Urinalysis was suggestive of  UTI.    She also has some hematuria as per UA, but her urine is grossly clear. Patient denies dysuria.   We will discontinue ceftriaxone .  Blood  cultures sent few days ago did not show any growth.     Stage III sacral ulcer:  surgery-I&D and debridement of the sacral decubitus today 04/28/2020   Another possible source of sepsis.  Has a chronic sacral ulcer with eschar .  Continue wound care, wound care nurse has been following.    As per the wound nurse evaluation, there was some foul smelling discharge.  -Continue Augmentin for now.  Severe hypokalemia:   -Tolerating p.o., correcting orally Likely related to diarrhea   Diarrhea:  -Improved Has been having diarrhea since last few days.    Negative GI pathogen panel.... Negative Abdomen right soft nontender nondistended  Imodium PRN  Severe systolic congestive heart failure:  -Stable Was euvolemic or dehydrated.   Echocardiogram done on 03/31/2020 showed ejection fraction of less than 20%.  On Lasix at home which is on hold due to soft blood pressure and  diarrhea.  Coronary artery disease: Cardiac cath done on 9/21 showed nonobstructive coronary artery disease. On aspirin Stable  History of paroxysmal A. fib: Currently in normal sinus rhythm.  On amiodarone.  On carvedilol for rate control, currently on hold.  Rate well controlled.  Hypertension -hypotensive Hypotensive on admission, blood pressure remained soft this morning, -But holding BP meds, gentle as needed IV fluid  History of rheumatoid arthritis:  On prednisone, methotrexate, hydroxychloroquine.  Continue prednisone and hydroxychloroquine.  Adjustment disorder with depressed mood: Stable  Started on Celexa during this hospitalization.  Continue Remeron.  Psychiatry seen the patient and did not recommend inpatient psychiatric admission.  Hyperlipidemia: On pravastatin  Infantile cerebral palsy: Bedbound .  Has contractures on the lower extremities.  Continue supportive care.  Debility/deconditioning/placement issues:  From assisted living Discussed with nursing staff social worker she likely  will be a candidate for SNF  She is on under Adult Protective Services.   We will continue to communicate with social worker for placement decision.   Nutrition Problem: Severe Malnutrition Etiology:  (cerebral palsy, CHF, chronic wounds)      DVT prophylaxis:Lovenox Code Status: Full Family Communication: Friend present at the bedside Status is: Observation   Dispo: The patient is from: Home              Anticipated d/c is to: SNF              Anticipated d/c date is: 2 days              Patient currently is not medically stable for discharge Awaiting general surgery evaluation for the wound   Consultants: None  Procedures: None  Antimicrobials:  Anti-infectives (From admission, onward)   Start     Dose/Rate Route Frequency Ordered Stop   04/28/20 1000  cefTRIAXone (ROCEPHIN) 1 g in sodium chloride 0.9 % 100 mL IVPB  Status:  Discontinued        1 g 200 mL/hr over 30 Minutes Intravenous Every 24 hours 04/27/20 1358 04/27/20 1406   04/28/20 0600  Ampicillin-Sulbactam (UNASYN) 3 g in sodium chloride 0.9 % 100 mL IVPB  Status:  Discontinued        3 g 200 mL/hr over 30 Minutes Intravenous On call to O.R. 04/27/20 1750 04/27/20 1754   04/28/20 0000  [MAR Hold]  Ampicillin-Sulbactam (UNASYN) 3 g in sodium chloride 0.9 % 100 mL IVPB        (MAR Hold since Wed 04/28/2020 at 1248.Hold Reason: Transfer to a Procedural area.)   3 g 200 mL/hr over 30 Minutes Intravenous Every 6 hours 04/27/20 1800     04/27/20 1445  amoxicillin-clavulanate (AUGMENTIN) 875-125 MG per tablet 1 tablet  Status:  Discontinued        1 tablet Oral Every 12 hours 04/27/20 1404 04/27/20 1754   04/27/20 1000  cefTRIAXone (ROCEPHIN) 1 g in sodium chloride 0.9 % 100 mL IVPB  Status:  Discontinued        1 g 200 mL/hr over 30 Minutes Intravenous Every 24 hours 04/26/20 1729 04/27/20 1358   04/26/20 1645  cefTRIAXone (ROCEPHIN) 1 g in sodium chloride 0.9 % 100 mL IVPB        1 g 200 mL/hr over 30 Minutes  Intravenous  Once 04/26/20 1636 04/26/20 1734   04/22/20 1000  [MAR Hold]  hydroxychloroquine (PLAQUENIL) tablet 400 mg        (MAR Hold since Wed 04/28/2020 at 1248.Hold Reason: Transfer to a Procedural area.)   400 mg Oral Daily 04/22/20 0209     04/21/20 2345  cephALEXin (KEFLEX) capsule 250 mg  Status:  Discontinued        250 mg Oral Every 8 hours 04/21/20 2343 04/26/20 1729        Objective: Vitals:   04/27/20 2348 04/28/20 0536 04/28/20 0758 04/28/20 1251  BP: (!) 97/55 (!) 109/58 (!) 109/56 (!) 105/58  Pulse: 71 68 67 73  Resp: 18 20  20   Temp: 98.8 F (37.1 C) 98.2 F (36.8 C) 98.2 F (36.8 C) 97.8 F (36.6 C)  TempSrc: Axillary Oral  Tympanic  SpO2: 98% 97% 97% 96%  Weight:    43 kg  Height:  5' (1.524 m)    Intake/Output Summary (Last 24 hours) at 04/28/2020 1531 Last data filed at 04/28/2020 1443 Gross per 24 hour  Intake 2578.83 ml  Output 240 ml  Net 2338.83 ml   Filed Weights   04/21/20 1238 04/28/20 1251  Weight: 43 kg 43 kg    Examination:     Physical Exam:   General:  Alert, oriented, cooperative, no distress; generalized cachexia, chronic sick looking  HEENT:  Normocephalic, PERRL, otherwise with in Normal limits   Neuro:  CNII-XII intact. , normal motor and sensation, reflexes intact   Lungs:   Clear to auscultation BL, Respirations unlabored, no wheezes / crackles  Cardio:    S1/S2, RRR, No murmure, No Rubs or Gallops   Abdomen:   Soft, non-tender, bowel sounds active all four quadrants,  no guarding or peritoneal signs.  Muscular skeletal:  Limited exam -contracted lower extremities strength,  2+ pulses,  symmetric, No pitting edema  Skin:  Dry, warm to touch, negative for any Rashes,  Stage III sacral ulcer with eschar  Wounds: Please see nursing documentation Pressure Injury 04/22/20 Buttocks Right Unstageable - Full thickness tissue loss in which the base of the injury is covered by slough (yellow, tan, gray, green or brown) and/or  eschar (tan, brown or black) in the wound bed. Yellow sough noted to R side,  (Active)  04/22/20 1059  Location: Buttocks  Location Orientation: Right  Staging: Unstageable - Full thickness tissue loss in which the base of the injury is covered by slough (yellow, tan, gray, green or brown) and/or eschar (tan, brown or black) in the wound bed.  Wound Description (Comments): Yellow sough noted to R side, black eschar noted to L side, yellow/brown drainage noted from yellow slough.   Present on Admission: Yes     Pressure Injury 04/22/20 Hip Left Deep Tissue Pressure Injury - Purple or maroon localized area of discolored intact skin or blood-filled blister due to damage of underlying soft tissue from pressure and/or shear. (Active)  04/22/20 1102  Location: Hip  Location Orientation: Left  Staging: Deep Tissue Pressure Injury - Purple or maroon localized area of discolored intact skin or blood-filled blister due to damage of underlying soft tissue from pressure and/or shear.  Wound Description (Comments):   Present on Admission: Yes     Pressure Injury 04/22/20 N/A Right Stage 1 -  Intact skin with non-blanchable redness of a localized area usually over a bony prominence. (Active)  04/22/20 1103  Location: N/A (Rib cage)  Location Orientation: Right  Staging: Stage 1 -  Intact skin with non-blanchable redness of a localized area usually over a bony prominence.  Wound Description (Comments):   Present on Admission: Yes     Pressure Injury 04/22/20 N/A Left Stage 1 -  Intact skin with non-blanchable redness of a localized area usually over a bony prominence. (Active)  04/22/20 1104  Location: N/A (Rib cage)  Location Orientation: Left  Staging: Stage 1 -  Intact skin with non-blanchable redness of a localized area usually over a bony prominence.  Wound Description (Comments):   Present on Admission: Yes     Pressure Injury 04/22/20 Heel Right Stage 2 -  Partial thickness loss of dermis  presenting as a shallow open injury with a red, pink wound bed without slough. (Active)  04/22/20 1106  Location: Heel  Location Orientation: Right  Staging: Stage 2 -  Partial thickness loss of dermis presenting as a shallow open injury with a red,  pink wound bed without slough.  Wound Description (Comments):   Present on Admission: Yes     Pressure Injury 04/22/20 Heel Left Stage 2 -  Partial thickness loss of dermis presenting as a shallow open injury with a red, pink wound bed without slough. (Active)  04/22/20 1107  Location: Heel  Location Orientation: Left  Staging: Stage 2 -  Partial thickness loss of dermis presenting as a shallow open injury with a red, pink wound bed without slough.  Wound Description (Comments):   Present on Admission: Yes     Pressure Injury 04/22/20 Buttocks Left Stage 4 - Full thickness tissue loss with exposed bone, tendon or muscle. (Active)  04/22/20   Location: Buttocks  Location Orientation: Left  Staging: Stage 4 - Full thickness tissue loss with exposed bone, tendon or muscle.  Wound Description (Comments):   Present on Admission: Yes         Data Reviewed: I have personally reviewed following labs and imaging studies  CBC: Recent Labs  Lab 04/26/20 1041 04/27/20 0628 04/28/20 0458  WBC 12.1* 13.7* 8.7  NEUTROABS 10.1* 10.8* 6.2  HGB 9.6* 8.5* 8.3*  HCT 31.5* 27.6* 27.6*  MCV 91.3 91.4 92.3  PLT 285 311 765   Basic Metabolic Panel: Recent Labs  Lab 04/26/20 1041 04/27/20 0628 04/28/20 0458  NA 140 139 142  K 2.9* 3.6 2.9*  CL 107 110 115*  CO2 25 20* 20*  GLUCOSE 125* 94 79  BUN 18 13 10   CREATININE 0.78 0.66 0.64  CALCIUM 8.3* 7.7* 7.5*  MG 1.9  --  1.7   GFR: Estimated Creatinine Clearance: 43.1 mL/min (by C-G formula based on SCr of 0.64 mg/dL). Liver Function Tests: Recent Labs  Lab 04/26/20 1041  AST 22  ALT 14  ALKPHOS 59  BILITOT 0.5  PROT 5.5*  ALBUMIN 2.2*   No results for input(s): LIPASE,  AMYLASE in the last 168 hours. No results for input(s): AMMONIA in the last 168 hours. Coagulation Profile: No results for input(s): INR, PROTIME in the last 168 hours. Cardiac Enzymes: No results for input(s): CKTOTAL, CKMB, CKMBINDEX, TROPONINI in the last 168 hours. BNP (last 3 results) No results for input(s): PROBNP in the last 8760 hours. HbA1C: No results for input(s): HGBA1C in the last 72 hours. CBG: No results for input(s): GLUCAP in the last 168 hours. Lipid Profile: No results for input(s): CHOL, HDL, LDLCALC, TRIG, CHOLHDL, LDLDIRECT in the last 72 hours. Thyroid Function Tests: No results for input(s): TSH, T4TOTAL, FREET4, T3FREE, THYROIDAB in the last 72 hours. Anemia Panel: No results for input(s): VITAMINB12, FOLATE, FERRITIN, TIBC, IRON, RETICCTPCT in the last 72 hours. Sepsis Labs: Recent Labs  Lab 04/26/20 1531 04/26/20 2056  LATICACIDVEN 2.0* 1.1    Recent Results (from the past 240 hour(s))  Blood culture (routine single)     Status: None   Collection Time: 04/21/20  1:00 PM   Specimen: BLOOD  Result Value Ref Range Status   Specimen Description BLOOD BLOOD RIGHT FOREARM  Final   Special Requests   Final    BOTTLES DRAWN AEROBIC AND ANAEROBIC Blood Culture results may not be optimal due to an inadequate volume of blood received in culture bottles   Culture   Final    NO GROWTH 5 DAYS Performed at [facility], [address], [place], [place] [address]    Report Status 04/26/2020 FINAL  Final  Urine culture     Status: Abnormal   Collection Time:  04/21/20 11:00 PM   Specimen: In/Out Cath Urine  Result Value Ref Range Status   Specimen Description   Final    IN/OUT CATH URINE Performed at [facility], [address] [place], [place] [address]    Special Requests   Final    NONE Performed at [facility], [address] [place], [place] [address]    Culture MULTIPLE SPECIES PRESENT, SUGGEST RECOLLECTION (A)   Final   Report Status 04/22/2020 FINAL  Final  Urine culture     Status: Abnormal   Collection Time: 04/26/20  2:58 PM   Specimen: Urine, Random  Result Value Ref Range Status   Specimen Description   Final    URINE, RANDOM Performed at [facility], [address] [place], [place] [address]    Special Requests   Final    NONE Performed at [facility], [address] [place], [place] [address]    Culture MULTIPLE SPECIES PRESENT, SUGGEST RECOLLECTION (A)  Final   Report Status 04/27/2020 FINAL  Final  Gastrointestinal Panel by PCR , Stool     Status: None   Collection Time: 04/26/20  2:58 PM   Specimen: Stool  Result Value Ref Range Status   Campylobacter species NOT DETECTED NOT DETECTED Final   Plesimonas shigelloides NOT DETECTED NOT DETECTED Final   Salmonella species NOT DETECTED NOT DETECTED Final   Yersinia enterocolitica NOT DETECTED NOT DETECTED Final   Vibrio species NOT DETECTED NOT DETECTED Final   Vibrio cholerae NOT DETECTED NOT DETECTED Final   Enteroaggregative E coli (EAEC) NOT DETECTED NOT DETECTED Final   Enteropathogenic E coli (EPEC) NOT DETECTED NOT DETECTED Final   Enterotoxigenic E coli (ETEC) NOT DETECTED NOT DETECTED Final   Shiga like toxin producing E coli (STEC) NOT DETECTED NOT DETECTED Final   Shigella/Enteroinvasive E coli (EIEC) NOT DETECTED NOT DETECTED Final   Cryptosporidium NOT DETECTED NOT DETECTED Final   Cyclospora cayetanensis NOT DETECTED NOT DETECTED Final   Entamoeba histolytica NOT DETECTED NOT DETECTED Final   Giardia lamblia NOT DETECTED NOT DETECTED Final   Adenovirus F40/41 NOT DETECTED NOT DETECTED Final   Astrovirus NOT DETECTED NOT DETECTED Final   Norovirus GI/GII NOT DETECTED NOT DETECTED Final   Rotavirus A NOT DETECTED NOT DETECTED Final   Sapovirus (I, II, IV, and V) NOT DETECTED NOT DETECTED Final    Comment: Performed at [facility], [address] [place],  [address]   Respiratory Panel by RT PCR (Flu A&B, Covid) - Nasopharyngeal Swab     Status: None   Collection Time: 04/26/20  3:51 PM   Specimen: Nasopharyngeal Swab  Result Value Ref Range Status   SARS Coronavirus 2 by RT PCR NEGATIVE NEGATIVE Final    Comment: (NOTE) SARS-CoV-2 target nucleic acids are NOT DETECTED.  The SARS-CoV-2 RNA is generally detectable in upper respiratoy specimens during the acute phase of infection. The lowest concentration of SARS-CoV-2 viral copies this assay can detect is 131 copies/mL. A negative result does not preclude SARS-Cov-2 infection and should not be used as the sole basis for treatment or other patient management decisions. A negative result may occur with  improper specimen collection/handling, submission of specimen other than nasopharyngeal swab, presence of viral mutation(s) within the areas targeted by this assay, and inadequate number of viral copies (<131 copies/mL). A negative result must be combined with clinical observations, patient history, and epidemiological information. The expected result is Negative.  Fact Sheet for Patients:  PinkCheek.be  Fact Sheet for Healthcare Providers:  GravelBags.it  This test is no t yet approved or cleared by the [place] FDA and  has been authorized for detection and/or diagnosis of SARS-CoV-2 by FDA under an Emergency Use Authorization (EUA). This EUA will remain  in effect (meaning this test can be used) for the duration of the COVID-19 declaration under Section 564(b)(1) of the Act, 21 U.S.C. section 360bbb-3(b)(1), unless the authorization is terminated or revoked sooner.     Influenza A by PCR NEGATIVE NEGATIVE Final   Influenza B by PCR NEGATIVE NEGATIVE Final    Comment: (NOTE) The Xpert Xpress SARS-CoV-2/FLU/RSV assay is intended as an aid in  the diagnosis of influenza from Nasopharyngeal swab specimens and  should not be used as  a sole basis for treatment. Nasal washings and  aspirates are unacceptable for Xpert Xpress SARS-CoV-2/FLU/RSV  testing.  Fact Sheet for Patients: PinkCheek.be  Fact Sheet for Healthcare Providers: GravelBags.it  This test is not yet approved or cleared by the [place] FDA and  has been authorized for detection and/or diagnosis of SARS-CoV-2 by  FDA under an Emergency Use Authorization (EUA). This EUA will remain  in effect (meaning this test can be used) for the duration of the  Covid-19 declaration under Section 564(b)(1) of the Act, 21  U.S.C. section 360bbb-3(b)(1), unless the authorization is  terminated or revoked. Performed at [facility], [address], [place], [place] [address]          Radiology Studies: [place] EKG SITE RITE  Result Date: 04/28/2020 If [facility] image not attached, placement could not be confirmed due to current cardiac rhythm.       Scheduled Meds: . [MAR Hold] amiodarone  200 mg Oral Daily  . [START ON 04/29/2020] vitamin C  250 mg Oral BID  . [MAR Hold] aspirin EC  81 mg Oral Daily  . [MAR Hold] calcium-vitamin D  2 tablet Oral Q breakfast  . [MAR Hold] cholecalciferol  1,000 Units Oral Daily  . [MAR Hold] citalopram  10 mg Oral Daily  . [MAR Hold] collagenase   Topical Daily  . [MAR Hold] docusate sodium  100 mg Oral Daily  . [MAR Hold] enoxaparin (LOVENOX) injection  30 mg Subcutaneous Q24H  . [MAR Hold] ferrous [id number] C-folic acid  1 capsule Oral BID PC  . [MAR Hold] hydroxychloroquine  400 mg Oral Daily  . [MAR Hold] latanoprost  1 drop Both Eyes QHS  . [MAR Hold] megestrol  400 mg Oral Daily  . [MAR Hold] mirtazapine  15 mg Oral QHS  . [START ON 04/29/2020] multivitamin-lutein  1 capsule Oral Daily  . [MAR Hold] potassium chloride  20 mEq Oral Daily  . [MAR Hold] potassium chloride  40 mEq Oral Once  . [MAR Hold] pravastatin  40 mg Oral QHS  .  [MAR Hold] predniSONE  2.5 mg Oral Q breakfast   Continuous Infusions: . sodium chloride 125 mL/hr at 04/28/20 1424  . [MAR Hold] ampicillin-sulbactam (UNASYN) IV 3 g (04/28/20 1106)     LOS: 1 day    Time spent:25 mins. More than 50% of that time was spent in counseling and/or coordination of care.      Deatra James, MD [facility] P10/13/2021, 3:31 PM

## 2020-04-28 NOTE — Transfer of Care (Signed)
Immediate Anesthesia Transfer of Care Note  Patient: Tina Salinas  Procedure(s) Performed: INCISION AND DRAINAGE ABSCESS (N/A )  Patient Location: PACU  Anesthesia Type:General  Level of Consciousness: awake and alert   Airway & Oxygen Therapy: Patient Spontanous Breathing and Patient connected to face mask oxygen  Post-op Assessment: Report given to RN and Post -op Vital signs reviewed and stable  Post vital signs: Reviewed and stable  Last Vitals:  Vitals Value Taken Time  BP 113/65 04/28/20 1531  Temp    Pulse 79 04/28/20 1537  Resp 16 04/28/20 1537  SpO2 100 % 04/28/20 1537  Vitals shown include unvalidated device data.  Last Pain:  Vitals:   04/28/20 1251  TempSrc: Tympanic  PainSc: 7          Complications: No complications documented.

## 2020-04-28 NOTE — Op Note (Signed)
  Procedure Date:  04/28/2020  Pre-operative Diagnosis:  Unstageable sacral decubitus wound  Post-operative Diagnosis:  Stage III sacral decubitus wound  Procedure:  Excisional debridement of sacral decubitus wound, including skin, subcutaneous tissue, and muscle, for total area of 15 x 10 cm.  Surgeon:  Melvyn Neth, MD  Assistant:  Erenest Blank, PA-S  Anesthesia:  General endotracheal  Estimated Blood Loss:  10 ml  Specimens:  Sacral wound tissue for culture  Complications:  None  Indications for Procedure:  This is a 74 y.o. female with diagnosis of unstageable sacral decubitus wound, requiring debridement procedure.  The risks of bleeding, abscess or infection, injury to surrounding structures, and need for further procedures were all discussed with the patient and was willing to proceed.  Description of Procedure: The patient was correctly identified in the preoperative area and brought into the operating room.  The patient was placed supine with VTE prophylaxis in place.  Appropriate time-outs were performed.  Anesthesia was induced and the patient was intubated.  Appropriate antibiotics were infused.  The patient was then placed in left lateral decubitus position.  The patient's sacral area was prepped and draped in usual sterile fashion.  The patient had most of the wound covered with eschar with underlying necrotic subcutaneous tissue and gluteal muscle bilaterally.  Using cautery, I did excisional debridement of the entire wound, including skin, subcutaneous tissue, and muscle down to the fascia, but not through it into bone.  The bone appeared healthy.  The tissue was sent for cultures.  Exparel mixed with 0.5% bupivacaine with epi was infiltrated subcutaneously.  The wound was irrigated and cautery was used for hemostasis.  At the end, the wound measured about 15 cm x 10 cm.  A large black foam wound vac was applied, bridging anteriorly over the right hip to avoid the  suction disc being on a pressure point.   The patient was then placed back on supine position, emerged from anesthesia, extubated, and brought to the recovery room for further management.  The patient tolerated the procedure well and all counts were correct at the end of the case.   Melvyn Neth, MD

## 2020-04-28 NOTE — Progress Notes (Signed)
Order received from Dr Roger Shelter to place picc line orders

## 2020-04-28 NOTE — Progress Notes (Signed)
PT Cancellation Note  Patient Details Name: Tina Salinas MRN: [id number] DOB: 04/17/1946   Cancelled Treatment:    Reason Eval/Treat Not Completed:  (Chart reviewed for attempted treatment session.  Noted plans for OR today (incision/debridement of sacral wound); per guidelines, will require new orders to resume PT efforts post-procedure.  Please re-consult as medically appropriate and patient willing/able to participate.)   Tina Salinas, PT, DPT, NCS 04/28/20, 10:14 AM [phone]

## 2020-04-28 NOTE — Anesthesia Procedure Notes (Signed)
Procedure Name: Intubation Performed by: Gaynelle Cage, CRNA Pre-anesthesia Checklist: Patient identified, Emergency Drugs available, Suction available and Patient being monitored Patient Re-evaluated:Patient Re-evaluated prior to induction Oxygen Delivery Method: Circle system utilized Preoxygenation: Pre-oxygenation with 100% oxygen Induction Type: IV induction Ventilation: Mask ventilation without difficulty Laryngoscope Size: Glidescope and 3 Grade View: Grade I Tube type: Oral Tube size: 6.5 mm Number of attempts: 1 Airway Equipment and Method: Stylet and Oral airway Placement Confirmation: ETT inserted through vocal cords under direct vision,  positive ETCO2 and breath sounds checked- equal and bilateral Secured at: 21 cm Tube secured with: Tape Dental Injury: Teeth and Oropharynx as per pre-operative assessment

## 2020-04-28 NOTE — Anesthesia Preprocedure Evaluation (Signed)
Anesthesia Evaluation  Patient identified by MRN, date of birth, ID band Patient awake    Reviewed: Allergy & Precautions, H&P , NPO status , Patient's Chart, lab work & pertinent test results, reviewed documented beta blocker date and time   Airway Mallampati: II  TM Distance: >3 FB Neck ROM: full    Dental  (+) Teeth Intact   Pulmonary neg pulmonary ROS, pneumonia, resolved,    Pulmonary exam normal        Cardiovascular Exercise Tolerance: Poor hypertension, On Medications + CAD, + Past MI, + Peripheral Vascular Disease and +CHF  Normal cardiovascular exam Rate:Normal     Neuro/Psych PSYCHIATRIC DISORDERS  Neuromuscular disease    GI/Hepatic negative GI ROS, Neg liver ROS,   Endo/Other  Hypothyroidism   Renal/GU Renal disease  negative genitourinary   Musculoskeletal   Abdominal   Peds  Hematology  (+) Blood dyscrasia, anemia ,   Anesthesia Other Findings   Reproductive/Obstetrics negative OB ROS                             Anesthesia Physical Anesthesia Plan  ASA: IV  Anesthesia Plan: General ETT   Post-op Pain Management:    Induction:   PONV Risk Score and Plan: 4 or greater  Airway Management Planned:   Additional Equipment:   Intra-op Plan:   Post-operative Plan:   Informed Consent: I have reviewed the patients History and Physical, chart, labs and discussed the procedure including the risks, benefits and alternatives for the proposed anesthesia with the patient or authorized representative who has indicated his/her understanding and acceptance.       Plan Discussed with: CRNA  Anesthesia Plan Comments:         Anesthesia Quick Evaluation

## 2020-04-28 NOTE — Progress Notes (Signed)
Initial Nutrition Assessment  DOCUMENTATION CODES:   Severe malnutrition in context of chronic illness  INTERVENTION:   Vital Cuisine TID, each supplement provides 520kcal and 22g of protein.   Magic cup TID with meals, each supplement provides 290 kcal and 9 grams of protein  Ocuvite daily for wound healing (provides zinc, vitamin A, vitamin C, Vitamin E, copper, and selenium)  Vitamin C 250mg  po BID  NUTRITION DIAGNOSIS:   Severe Malnutrition related to  (cerebral palsy, CHF, chronic wounds) as evidenced by severe fat depletion, severe muscle depletion.  GOAL:   Patient will meet greater than or equal to 90% of their needs  MONITOR:   PO intake, Supplement acceptance, Labs, Weight trends, Skin, I & O's  REASON FOR ASSESSMENT:   Malnutrition Screening Tool    ASSESSMENT:   74 y.o. female with medical history significant for CAD s/p stent angioplasty, CHF with preserved LVEF, peripheral vascular disease, rheumatoid arthritis, stage III CKD, atrial fibrillation, NSTEMI, HTN, bedbound and cerebral palsy who is admitted with sepsis, UTI and sacral wound   Pt is well known to nutrition department and this RD from multiple previous admits. Pt is a poor historian. Pt with poor appetite and oral intake at baseline. Pt reports that she drinks Ensure at home but pt is normally non-compliant with supplements in hospital. Pt is currently NPO for scheduled  I & D w/ VAC placement today. RD will add supplements to meal trays and add vitamins to help pt meet her estimated needs and support wound healing. Per chart, pt is weight stable at baseline. Pt with chronic severe malnutrition.   Medications reviewed and include: aspirin, oscal w/ D, D3, celexa, colace, lovenox, ferrous fumerate, W62, Vit C, folic acid, megace, remeron, KCl, prednisone, NaCl @75ml /hr, unasyn  Labs reviewed: K 2.9(L), Mg 1.7 wnl Hgb 8.3(L), Hct 27.6(L)  NUTRITION - FOCUSED PHYSICAL EXAM:    Most Recent Value   Orbital Region Severe depletion  Upper Arm Region Severe depletion  Thoracic and Lumbar Region Severe depletion  Buccal Region Severe depletion  Temple Region Severe depletion  Clavicle Bone Region Severe depletion  Clavicle and Acromion Bone Region Severe depletion  Scapular Bone Region Severe depletion  Dorsal Hand Severe depletion  Patellar Region Severe depletion  Anterior Thigh Region Severe depletion  Posterior Calf Region Severe depletion  Edema (RD Assessment) Mild  Hair Reviewed  Eyes Reviewed  Mouth Reviewed  Skin Reviewed  Nails Reviewed     Diet Order:   Diet Order            Diet NPO time specified  Diet effective midnight                EDUCATION NEEDS:   No education needs have been identified at this time  Skin:  Skin Assessment: Reviewed RN Assessment (Left hip with dark purple deep tissue injury; 5X7cm, Buttocks/sacrum with 2 wounds which are separated with a bridge of intact skin, Left upper buttock/sacrum is stage 4 pressure injury; 5X7X.3cm, Right upper buttock/sacrum is unstageable; 5X5cm)  Last BM:  10/10- per chart  Height:   Ht Readings from Last 1 Encounters:  04/28/20 5' (1.524 m)    Weight:   Wt Readings from Last 1 Encounters:  04/28/20 43 kg    Ideal Body Weight:  45.4 kg  BMI:  Body mass index is 18.51 kg/m.  Estimated Nutritional Needs:   Kcal:  1300-1500kcal/day  Protein:  65-75g/day  Fluid:  1-1.2L/day  Koleen Distance MS, RD, LDN  Please refer to [facility] for RD and/or RD on-call/weekend/after hours pager

## 2020-04-28 NOTE — Progress Notes (Signed)
[place] Hospital Day(s): 1.   Interval History: Patient seen and examined, no acute events or new complaints overnight. Patient reports she has soreness in her legs but otherwise doing okay. Leukocytosis resolved this morning, now 8.7K. She continues to have hypokalemia to 2.9 despite repletion. Renal function remains normal, sCr - 0.64. Plan for incision and debridement in the OR this afternoon. She is agreeable to this.    Vital signs in last 24 hours: [min-max] current  Temp:  [97.8 F (36.6 C)-98.8 F (37.1 C)] 98.2 F (36.8 C) (10/13 0758) Pulse Rate:  [66-78] 67 (10/13 0758) Resp:  [16-20] 20 (10/13 0536) BP: (97-129)/(55-79) 109/56 (10/13 0758) SpO2:  [94 %-100 %] 97 % (10/13 0758)     Height: 5' (152.4 cm) Weight: 43 kg BMI (Calculated): 18.51   Intake/Output last 2 shifts:  10/12 0701 - 10/13 0700 In: 2578.8 [I.V.:2478.8; IV Piggyback:100] Out: 200 [Urine:200]   Physical Exam:  Constitutional: alert, cooperative and no distress  Respiratory: breathing non-labored at rest  Cardiovascular: regular rate and sinus rhythm  Musculoskeletal: Chronic atrophy to extremities secondary to CP  Labs:  CBC Latest Ref Rng & Units 04/28/2020 04/27/2020 04/26/2020  WBC 4.0 - 10.5 K/uL 8.7 13.7(H) 12.1(H)  Hemoglobin 12.0 - 15.0 g/dL 8.3(L) 8.5(L) 9.6(L)  Hematocrit 36 - 46 % 27.6(L) 27.6(L) 31.5(L)  Platelets 150 - 400 K/uL 293 311 285   CMP Latest Ref Rng & Units 04/28/2020 04/27/2020 04/26/2020  Glucose 70 - 99 mg/dL 79 94 125(H)  BUN 8 - 23 mg/dL 10 13 18   Creatinine 0.44 - 1.00 mg/dL 0.64 0.66 0.78  Sodium 135 - 145 mmol/L 142 139 140  Potassium 3.5 - 5.1 mmol/L 2.9(L) 3.6 2.9(L)  Chloride 98 - 111 mmol/L 115(H) 110 107  CO2 22 - 32 mmol/L 20(L) 20(L) 25  Calcium 8.9 - 10.3 mg/dL 7.5(L) 7.7(L) 8.3(L)  Total Protein 6.5 - 8.1 g/dL - - 5.5(L)  Total Bilirubin 0.3 - 1.2 mg/dL - - 0.5  Alkaline Phos 38 - 126 U/L - - 59  AST 15 - 41  U/L - - 22  ALT 0 - 44 U/L - - 14     Imaging studies: No new pertinent imaging studies   Assessment/Plan:  74 y.o. female with large unstageable sacral ulceration, complicated by pertinent comorbidities including debilitation and limited mobility at baseline secondary to cerebral palsy.   - Will tentatively plan for incision and debridement in the OR this afternoon with Dr Hampton Abbot pending OR/Anesthesia availability. Despite her comorbid conditions, I do not think she will be able to tolerate any bedside procedures.              - All risks, benefits, and alternatives to above procedure(s) were discussed with the patient, all of her questions were answered to her expressed satisfaction, patient expresses she wishes to proceed, and informed consent was obtained.   - Continue ABx             - Local wound care: Continue local dressings, frequent repositioning, low air loss mattress    - Pain control prn              - DVT prophylaxis; hold             - Further management per primary service    All of the above findings and recommendations were discussed with the patient, and the medical team, and all of patient's questions were answered to her expressed  satisfaction.  -- Edison Simon, PA-C [facility] 04/28/2020, 8:24 AM [phone] M-F: 7am - 4pm

## 2020-04-29 ENCOUNTER — Encounter: Payer: Self-pay | Admitting: Surgery

## 2020-04-29 DIAGNOSIS — R531 Weakness: Secondary | ICD-10-CM | POA: Diagnosis not present

## 2020-04-29 DIAGNOSIS — F4321 Adjustment disorder with depressed mood: Secondary | ICD-10-CM | POA: Diagnosis not present

## 2020-04-29 DIAGNOSIS — N390 Urinary tract infection, site not specified: Secondary | ICD-10-CM | POA: Diagnosis not present

## 2020-04-29 DIAGNOSIS — E440 Moderate protein-calorie malnutrition: Secondary | ICD-10-CM | POA: Diagnosis not present

## 2020-04-29 LAB — BASIC METABOLIC PANEL
Anion gap: 8 (ref 5–15)
BUN: 9 mg/dL (ref 8–23)
CO2: 20 mmol/L — ABNORMAL LOW (ref 22–32)
Calcium: 7 mg/dL — ABNORMAL LOW (ref 8.9–10.3)
Chloride: 117 mmol/L — ABNORMAL HIGH (ref 98–111)
Creatinine, Ser: 0.64 mg/dL (ref 0.44–1.00)
GFR, Estimated: >60 mL/min (ref >60)
Glucose, Bld: 120 mg/dL — ABNORMAL HIGH (ref 70–99)
Potassium: 3.9 mmol/L (ref 3.5–5.1)
Sodium: 145 mmol/L (ref 135–145)

## 2020-04-29 LAB — CBC
HCT: 28.6 % — ABNORMAL LOW (ref 36.0–46.0)
Hemoglobin: 8.3 g/dL — ABNORMAL LOW (ref 12.0–15.0)
MCH: 27.6 pg (ref 26.0–34.0)
MCHC: 29 g/dL — ABNORMAL LOW (ref 30.0–36.0)
MCV: 95 fL (ref 80.0–100.0)
Platelets: 304 10*3/uL (ref 150–400)
RBC: 3.01 MIL/uL — ABNORMAL LOW (ref 3.87–5.11)
RDW: 19.4 % — ABNORMAL HIGH (ref 11.5–15.5)
WBC: 5.9 10*3/uL (ref 4.0–10.5)
nRBC: 0 % (ref 0.0–0.2)

## 2020-04-29 MED ORDER — SODIUM CHLORIDE 0.9% FLUSH: 10.0000 mL | INTRAVENOUS | Status: DC | PRN

## 2020-04-29 MED ORDER — SODIUM CHLORIDE 0.9% FLUSH
10.0000 mL | Freq: Two times a day (BID) | INTRAVENOUS | Status: DC
  Administered 2020-04-29: 20 mL
  Administered 2020-04-29 – 2020-05-06 (×11): 10 mL
  Administered 2020-05-09: 20 mL
  Administered 2020-05-09 – 2020-05-10 (×2): 10 mL

## 2020-04-29 MED ORDER — CHLORHEXIDINE GLUCONATE CLOTH 2 % EX PADS
6.0000 | MEDICATED_PAD | Freq: Every day | CUTANEOUS | Status: DC
  Administered 2020-05-01 – 2020-05-07 (×7): 6 via TOPICAL

## 2020-04-29 NOTE — TOC Progression Note (Signed)
Transition of Care [facility]) - Progression Note    Patient Details  Name: Tina Salinas MRN: [id number] Date of Birth: May 31, 1946  Transition of Care [facility]) CM/SW [place], [place] Phone Number: [phone] 04/29/2020, 2:38 PM  Clinical Narrative:     CSW spoke with Norma Fredrickson patient's main care giver and friend, [phone] for update on patient status. This CSW informed Ms. Rowland Lathe CSW is assigned to the patient.  CSW spoke with Judson Roch about conversation w/ Ms/ Randel Pigg.  CSW with Sarah's agreement contacted [facility]ney DSS/APS [phone] to update her on patient status and conversation with Ms. Blythe.  Expected Discharge Plan: Long Term Nursing Home Barriers to Discharge: ED Patient Insisting on an Alternate Living Situation/Facility  Expected Discharge Plan and Services Expected Discharge Plan: Long Term Nursing Home In-house Referral: Clinical Social Work   Post Acute Care Choice: Nursing Home Living arrangements for the past 2 months: Single Family Home                                       Social Determinants of Health (SDOH) Interventions    Readmission Risk Interventions No flowsheet data found.
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
## 2020-04-29 NOTE — Progress Notes (Signed)
[place] Hospital Day(s): 2.   Post op day(s): 1 Day Post-Op.   Interval History:  Patient seen and examined no acute events or new complaints overnight.  Patient is somewhat tangential this morning and talking about going to [place]  She does endorse soreness with positional changes but otherwise denied pain Leukocytosis remains resolved, now 5.9K Hgb stable but low at 8.3 Renal function remains normal, sCr - 0.64, UO - 350ccs No significant electrolyte derangements Cx from OR still pending Wound vac to sacrum; good seal, output measured 25 ccs  Vital signs in last 24 hours: [min-max] current  Temp:  [97.6 F (36.4 C)-98.2 F (36.8 C)] 97.7 F (36.5 C) (10/14 0513) Pulse Rate:  [67-74] 71 (10/14 0513) Resp:  [16-20] 16 (10/14 0513) BP: (92-109)/(51-59) 100/55 (10/14 0513) SpO2:  [96 %-100 %] 99 % (10/14 0513) Weight:  [43 kg] 43 kg (10/13 1251)     Height: 5' (152.4 cm) Weight: 43 kg BMI (Calculated): 18.51   Intake/Output last 2 shifts:  10/13 0701 - 10/14 0700 In: 1662.9 [P.O.:480; I.V.:982.9; IV Piggyback:200] Out: 415 [Urine:350; Drains:25; Blood:40]   Physical Exam:  Constitutional: alert, cooperative and no distress  Respiratory: breathing non-labored at rest  Cardiovascular: regular rate and sinus rhythm  Musculoskeletal: Chronic atrophy to extremities secondary to CP Integumentary: Wound vac to sacrum; good seal, serosanguinous output  Labs:  CBC Latest Ref Rng & Units 04/29/2020 04/28/2020 04/27/2020  WBC 4.0 - 10.5 K/uL 5.9 8.7 13.7(H)  Hemoglobin 12.0 - 15.0 g/dL 8.3(L) 8.3(L) 8.5(L)  Hematocrit 36 - 46 % 28.6(L) 27.6(L) 27.6(L)  Platelets 150 - 400 K/uL 304 293 311   CMP Latest Ref Rng & Units 04/29/2020 04/28/2020 04/27/2020  Glucose 70 - 99 mg/dL 120(H) 79 94  BUN 8 - 23 mg/dL 9 10 13   Creatinine 0.44 - 1.00 mg/dL 0.64 0.64 0.66  Sodium 135 - 145 mmol/L 145 142 139  Potassium 3.5 - 5.1 mmol/L 3.9 2.9(L)  3.6  Chloride 98 - 111 mmol/L 117(H) 115(H) 110  CO2 22 - 32 mmol/L 20(L) 20(L) 20(L)  Calcium 8.9 - 10.3 mg/dL 7.0(L) 7.5(L) 7.7(L)  Total Protein 6.5 - 8.1 g/dL - - -  Total Bilirubin 0.3 - 1.2 mg/dL - - -  Alkaline Phos 38 - 126 U/L - - -  AST 15 - 41 U/L - - -  ALT 0 - 44 U/L - - -     Imaging studies: No new pertinent imaging studies   Assessment/Plan:  74 y.o. female 1 Day Post-Op s/p excisional debridement of sacral decubitus wound, including skin, subcutaneous tissue, and muscle, for total area of 15 x 10 cm and application of wound vac, complicated by pertinent comorbidities including cerebral palsy.   - Continue wound vac; will plan on bedside changes MWF   - Continue Abs; follow up Cx from OR   - Local wound care: Continue local dressings, frequent repositioning, low air loss mattress              - Pain control prn   - Further management per primary service     All of the above findings and recommendations were discussed with the patient, and the medical team, and all of patient's questions were answered to her expressed satisfaction.  -- Edison Simon, PA-C [facility] 04/29/2020, 7:32 AM [phone] M-F: 7am - 4pm

## 2020-04-29 NOTE — Progress Notes (Signed)
PROGRESS NOTE    Tina Salinas  GMW:[id number] DOB: [birth year] DOA: 04/21/2020 PCP: Birdie Sons, MD   Chief Complain: Weakness, placement issue   Subjective: The patient was seen and examined this morning, awake alert oriented no acute distress. Hemodynamically stable  Postop day #1 Status post debridement of sacral decubitus stage III wound Tolerated procedure well    Brief Narrative:  Patient is a 74 y.o. female with medical history significant of cerebral palsy, congestive heart failure, hypertension, rheumatoid arthritis, hyperlipidemia, proximal A. fib who was brought from home with complaints of generalized weakness, failure to thrive and psychiatric evaluation.  She is bedbound at baseline and is under Adult Scientist, forensic.  She is cared by home health aides but recently started firing them and denied their service.  Patient was in the emergency department for last few days waiting for placement in a skilled nursing facility.  During her stay in ED, ED physicians suspected that she developed sepsis secondary to UTI and requested hospital admission.  Currently she is waiting for placement.SW following.  Assessment & Plan:   Active Problems:   Adjustment disorder with depressed mood   Infantile cerebral palsy (HCC)   Chronic combined systolic and diastolic CHF (congestive heart failure) (HCC)   Malnutrition of moderate degree   Hypokalemia   Pressure injury of skin   Sepsis ([place])   Suspected Sepsis/urinary tract infection:  Possible large sacral decubitus contributing -Hemodynamically stable, sepsis physiology resolved -Continue gentle IV fluid hydration, IV antibiotics -Unasyn  -Lactic acid 2.0 >> 1.1 -WBC 13.7 >> 8.7 >> 5.9  Urine culture sent few days ago showed multiple species.  Repeat urine culture. Urinalysis was suggestive of  UTI.    She also has some hematuria as per UA, but her urine is grossly clear. Patient denies dysuria.   We will  discontinue ceftriaxone .  Blood cultures sent few days ago did not show any growth.     Stage III sacral ulcer- POA: Postop day #1 surgery-I&D and debridement of the sacral decubitus today 04/28/2020 Tolerated procedure well  Another possible source of sepsis.  Has a chronic sacral ulcer with eschar .  Continue wound care, wound care nurse has been following.    As per the wound nurse evaluation, there was some foul smelling discharge.  -Continue antibiotics -Unasyn  Severe hypokalemia:   -Tolerating p.o., correcting orally Likely related to diarrhea - K  2.9>> 3.9   Diarrhea:  -Improving Has been having diarrhea since last few days.    Negative GI pathogen panel.... Negative Abdomen right soft nontender nondistended  Imodium PRN  Severe systolic congestive heart failure:  -Remained stable-denies shortness of breath or edema Was euvolemic or dehydrated.   Echocardiogram done on 03/31/2020 showed ejection fraction of less than 20%.  On Lasix at home which is on hold due to soft blood pressure and  diarrhea.  Coronary artery disease: Cardiac cath done on 9/21 showed nonobstructive coronary artery disease. On aspirin Remained stable  History of paroxysmal A. fib: Currently in normal sinus rhythm.  On amiodarone.  On carvedilol for rate control, currently on hold.  Rate well controlled.  Hypertension -was hypotensive on admission -Blood pressure has improved, stabilized -Still holding BP meds, gentle as needed IV fluid  History of rheumatoid arthritis:  On prednisone, methotrexate, hydroxychloroquine.  Continue prednisone and hydroxychloroquine.  Adjustment disorder with depressed mood: Remained stable  Started on Celexa during this hospitalization.  Continue Remeron.  Psychiatry seen the patient and did not  recommend inpatient psychiatric admission.  Hyperlipidemia: On pravastatin  Infantile cerebral palsy: Bedbound .  Has contractures on the lower extremities.   Continue supportive care.  Debility/deconditioning/placement issues:  Patient stating from home, repeating help Discussed with nursing staff social worker and patient she would like to return home with home health, possible med  She is on under Adult Protective Services.   We will continue to communicate with social worker for placement decision.   Nutrition Problem: Severe Malnutrition Etiology:  (cerebral palsy, CHF, chronic wounds)      DVT prophylaxis:Lovenox Code Status: Full Family Communication: Friend present at the bedside Status is: Observation   Dispo: The patient is from: Home              Anticipated d/c is to: SNF versus home with home health              Anticipated d/c date is: 2 days              Patient currently is not medically stable for discharge Awaiting general surgery evaluation for the wound   Consultants: None  Procedures: None  Antimicrobials:  Anti-infectives (From admission, onward)   Start     Dose/Rate Route Frequency Ordered Stop   04/28/20 1000  cefTRIAXone (ROCEPHIN) 1 g in sodium chloride 0.9 % 100 mL IVPB  Status:  Discontinued        1 g 200 mL/hr over 30 Minutes Intravenous Every 24 hours 04/27/20 1358 04/27/20 1406   04/28/20 0600  Ampicillin-Sulbactam (UNASYN) 3 g in sodium chloride 0.9 % 100 mL IVPB  Status:  Discontinued        3 g 200 mL/hr over 30 Minutes Intravenous On call to O.R. 04/27/20 1750 04/27/20 1754   04/28/20 0000  Ampicillin-Sulbactam (UNASYN) 3 g in sodium chloride 0.9 % 100 mL IVPB        3 g 200 mL/hr over 30 Minutes Intravenous Every 6 hours 04/27/20 1800     04/27/20 1445  amoxicillin-clavulanate (AUGMENTIN) 875-125 MG per tablet 1 tablet  Status:  Discontinued        1 tablet Oral Every 12 hours 04/27/20 1404 04/27/20 1754   04/27/20 1000  cefTRIAXone (ROCEPHIN) 1 g in sodium chloride 0.9 % 100 mL IVPB  Status:  Discontinued        1 g 200 mL/hr over 30 Minutes Intravenous Every 24 hours 04/26/20 1729  04/27/20 1358   04/26/20 1645  cefTRIAXone (ROCEPHIN) 1 g in sodium chloride 0.9 % 100 mL IVPB        1 g 200 mL/hr over 30 Minutes Intravenous  Once 04/26/20 1636 04/26/20 1734   04/22/20 1000  hydroxychloroquine (PLAQUENIL) tablet 400 mg        400 mg Oral Daily 04/22/20 0209     04/21/20 2345  cephALEXin (KEFLEX) capsule 250 mg  Status:  Discontinued        250 mg Oral Every 8 hours 04/21/20 2343 04/26/20 1729        Objective: Vitals:   04/28/20 2043 04/29/20 0007 04/29/20 0513 04/29/20 0915  BP: (!) 102/59 (!) 92/51 (!) 100/55 106/61  Pulse: 74 68 71 77  Resp: 19 16 16 18   Temp: 97.8 F (36.6 C) 97.7 F (36.5 C) 97.7 F (36.5 C) 98.1 F (36.7 C)  TempSrc: Oral Oral Oral Oral  SpO2: 100% 99% 99% 98%  Weight:      Height:        Intake/Output Summary (Last 24  hours) at 04/29/2020 1036 Last data filed at 04/29/2020 0400 Gross per 24 hour  Intake 1662.92 ml  Output 415 ml  Net 1247.92 ml   Filed Weights   04/21/20 1238 04/28/20 1251  Weight: 43 kg 43 kg    Examination:     Physical Exam:  Patient not ambulating at baseline, contracted lower extremities  General:  Alert, oriented, cooperative, no distress; chronically ill, cachectic  HEENT:  Normocephalic, PERRL, otherwise with in Normal limits   Neuro:  CNII-XII intact. , normal motor and sensation, reflexes intact   Lungs:   Clear to auscultation BL, Respirations unlabored, no wheezes / crackles  Cardio:    S1/S2, RRR, No murmure, No Rubs or Gallops   Abdomen:   Soft, non-tender, bowel sounds active all four quadrants,  no guarding or peritoneal signs.  Muscular skeletal:  Limited exam - in bed, able to move all upper extremities, able to move upper extremities 2+ pulses,  symmetric, No pitting edema-lower extremity contraction  Skin:  Dry, warm to touch, negative for any Rashes, large sacral decubitus  Wounds: Please see nursing documentation Pressure Injury 04/22/20 Buttocks Right Unstageable - Full  thickness tissue loss in which the base of the injury is covered by slough (yellow, tan, gray, green or brown) and/or eschar (tan, brown or black) in the wound bed. Yellow sough noted to R side,  (Active)  04/22/20 1059  Location: Buttocks  Location Orientation: Right  Staging: Unstageable - Full thickness tissue loss in which the base of the injury is covered by slough (yellow, tan, gray, green or brown) and/or eschar (tan, brown or black) in the wound bed.  Wound Description (Comments): Yellow sough noted to R side, black eschar noted to L side, yellow/brown drainage noted from yellow slough.   Present on Admission: Yes     Pressure Injury 04/22/20 Hip Left Deep Tissue Pressure Injury - Purple or maroon localized area of discolored intact skin or blood-filled blister due to damage of underlying soft tissue from pressure and/or shear. (Active)  04/22/20 1102  Location: Hip  Location Orientation: Left  Staging: Deep Tissue Pressure Injury - Purple or maroon localized area of discolored intact skin or blood-filled blister due to damage of underlying soft tissue from pressure and/or shear.  Wound Description (Comments):   Present on Admission: Yes     Pressure Injury 04/22/20 N/A Right Stage 1 -  Intact skin with non-blanchable redness of a localized area usually over a bony prominence. (Active)  04/22/20 1103  Location: N/A (Rib cage)  Location Orientation: Right  Staging: Stage 1 -  Intact skin with non-blanchable redness of a localized area usually over a bony prominence.  Wound Description (Comments):   Present on Admission: Yes     Pressure Injury 04/22/20 N/A Left Stage 1 -  Intact skin with non-blanchable redness of a localized area usually over a bony prominence. (Active)  04/22/20 1104  Location: N/A (Rib cage)  Location Orientation: Left  Staging: Stage 1 -  Intact skin with non-blanchable redness of a localized area usually over a bony prominence.  Wound Description (Comments):    Present on Admission: Yes     Pressure Injury 04/22/20 Heel Right Stage 2 -  Partial thickness loss of dermis presenting as a shallow open injury with a red, pink wound bed without slough. (Active)  04/22/20 1106  Location: Heel  Location Orientation: Right  Staging: Stage 2 -  Partial thickness loss of dermis presenting as a shallow open injury  with a red, pink wound bed without slough.  Wound Description (Comments):   Present on Admission: Yes     Pressure Injury 04/22/20 Heel Left Stage 2 -  Partial thickness loss of dermis presenting as a shallow open injury with a red, pink wound bed without slough. (Active)  04/22/20 1107  Location: Heel  Location Orientation: Left  Staging: Stage 2 -  Partial thickness loss of dermis presenting as a shallow open injury with a red, pink wound bed without slough.  Wound Description (Comments):   Present on Admission: Yes     Pressure Injury 04/22/20 Buttocks Left Stage 4 - Full thickness tissue loss with exposed bone, tendon or muscle. (Active)  04/22/20   Location: Buttocks  Location Orientation: Left  Staging: Stage 4 - Full thickness tissue loss with exposed bone, tendon or muscle.  Wound Description (Comments):   Present on Admission: Yes           Data Reviewed: I have personally reviewed following labs and imaging studies  CBC: Recent Labs  Lab 04/26/20 1041 04/27/20 0628 04/28/20 0458 04/29/20 0313  WBC 12.1* 13.7* 8.7 5.9  NEUTROABS 10.1* 10.8* 6.2  --   HGB 9.6* 8.5* 8.3* 8.3*  HCT 31.5* 27.6* 27.6* 28.6*  MCV 91.3 91.4 92.3 95.0  PLT 285 311 293 818   Basic Metabolic Panel: Recent Labs  Lab 04/26/20 1041 04/27/20 0628 04/28/20 0458 04/29/20 0313  NA 140 139 142 145  K 2.9* 3.6 2.9* 3.9  CL 107 110 115* 117*  CO2 25 20* 20* 20*  GLUCOSE 125* 94 79 120*  BUN 18 13 10 9   CREATININE 0.78 0.66 0.64 0.64  CALCIUM 8.3* 7.7* 7.5* 7.0*  MG 1.9  --  1.7  --    GFR: Estimated Creatinine Clearance: 43.1 mL/min  (by C-G formula based on SCr of 0.64 mg/dL). Liver Function Tests: Sepsis Labs: Recent Labs  Lab 04/26/20 1531 04/26/20 2056  LATICACIDVEN 2.0* 1.1    Recent Results (from the past 240 hour(s))  Blood culture (routine single)     Status: None   Collection Time: 04/21/20  1:00 PM   Specimen: BLOOD  Result Value Ref Range Status   Specimen Description BLOOD BLOOD RIGHT FOREARM  Final   Special Requests   Final    BOTTLES DRAWN AEROBIC AND ANAEROBIC Blood Culture results may not be optimal due to an inadequate volume of blood received in culture bottles   Culture   Final    NO GROWTH 5 DAYS Performed at [facility], [address], [place], [place] [address]    Report Status 04/26/2020 FINAL  Final  Urine culture     Status: Abnormal   Collection Time: 04/21/20 11:00 PM   Specimen: In/Out Cath Urine  Result Value Ref Range Status   Specimen Description   Final    IN/OUT CATH URINE Performed at [facility], [address] [place], [place] [address]    Special Requests   Final    NONE Performed at [facility], [address], [place], [place] [address]    Culture MULTIPLE SPECIES PRESENT, SUGGEST RECOLLECTION (A)  Final   Report Status 04/22/2020 FINAL  Final  Urine culture     Status: Abnormal   Collection Time: 04/26/20  2:58 PM   Specimen: Urine, Random  Result Value Ref Range Status   Specimen Description   Final    URINE, RANDOM Performed at [facility], [address] [place],  [place] [address]    Special Requests   Final    NONE Performed at [facility], [address] [place], [place] [address]    Culture MULTIPLE SPECIES PRESENT, SUGGEST RECOLLECTION (A)  Final   Report Status 04/27/2020 FINAL  Final  Gastrointestinal Panel by PCR , Stool     Status: None   Collection Time: 04/26/20  2:58 PM   Specimen: Stool  Result Value Ref Range Status   Campylobacter species NOT DETECTED NOT DETECTED Final    Plesimonas shigelloides NOT DETECTED NOT DETECTED Final   Salmonella species NOT DETECTED NOT DETECTED Final   Yersinia enterocolitica NOT DETECTED NOT DETECTED Final   Vibrio species NOT DETECTED NOT DETECTED Final   Vibrio cholerae NOT DETECTED NOT DETECTED Final   Enteroaggregative E coli (EAEC) NOT DETECTED NOT DETECTED Final   Enteropathogenic E coli (EPEC) NOT DETECTED NOT DETECTED Final   Enterotoxigenic E coli (ETEC) NOT DETECTED NOT DETECTED Final   Shiga like toxin producing E coli (STEC) NOT DETECTED NOT DETECTED Final   Shigella/Enteroinvasive E coli (EIEC) NOT DETECTED NOT DETECTED Final   Cryptosporidium NOT DETECTED NOT DETECTED Final   Cyclospora cayetanensis NOT DETECTED NOT DETECTED Final   Entamoeba histolytica NOT DETECTED NOT DETECTED Final   Giardia lamblia NOT DETECTED NOT DETECTED Final   Adenovirus F40/41 NOT DETECTED NOT DETECTED Final   Astrovirus NOT DETECTED NOT DETECTED Final   Norovirus GI/GII NOT DETECTED NOT DETECTED Final   Rotavirus A NOT DETECTED NOT DETECTED Final   Sapovirus (I, II, IV, and V) NOT DETECTED NOT DETECTED Final    Comment: Performed at [facility], [address] [place], [place] [address]  Respiratory Panel by RT PCR (Flu A&B, Covid) - Nasopharyngeal Swab     Status: None   Collection Time: 04/26/20  3:51 PM   Specimen: Nasopharyngeal Swab  Result Value Ref Range Status   SARS Coronavirus 2 by RT PCR NEGATIVE NEGATIVE Final    Comment: (NOTE) SARS-CoV-2 target nucleic acids are NOT DETECTED.  The SARS-CoV-2 RNA is generally detectable in upper respiratoy specimens during the acute phase of infection. The lowest concentration of SARS-CoV-2 viral copies this assay can detect is 131 copies/mL. A negative result does not preclude SARS-Cov-2 infection and should not be used as the sole basis for treatment or other patient management decisions. A negative result may occur with  improper specimen collection/handling,  submission of specimen other than nasopharyngeal swab, presence of viral mutation(s) within the areas targeted by this assay, and inadequate number of viral copies (<131 copies/mL). A negative result must be combined with clinical observations, patient history, and epidemiological information. The expected result is Negative.  Fact Sheet for Patients:  PinkCheek.be  Fact Sheet for Healthcare Providers:  GravelBags.it  This test is no t yet approved or cleared by the [place] FDA and  has been authorized for detection and/or diagnosis of SARS-CoV-2 by FDA under an Emergency Use Authorization (EUA). This EUA will remain  in effect (meaning this test can be used) for the duration of the COVID-19 declaration under Section 564(b)(1) of the Act, 21 U.S.C. section 360bbb-3(b)(1), unless the authorization is terminated or revoked sooner.     Influenza A by PCR NEGATIVE NEGATIVE Final   Influenza B by PCR NEGATIVE NEGATIVE Final    Comment: (NOTE) The Xpert Xpress SARS-CoV-2/FLU/RSV assay is intended as an aid in  the diagnosis of influenza from Nasopharyngeal swab specimens and  should not be used as a  sole basis for treatment. Nasal washings and  aspirates are unacceptable for Xpert Xpress SARS-CoV-2/FLU/RSV  testing.  Fact Sheet for Patients: PinkCheek.be  Fact Sheet for Healthcare Providers: GravelBags.it  This test is not yet approved or cleared by the [place] FDA and  has been authorized for detection and/or diagnosis of SARS-CoV-2 by  FDA under an Emergency Use Authorization (EUA). This EUA will remain  in effect (meaning this test can be used) for the duration of the  Covid-19 declaration under Section 564(b)(1) of the Act, 21  U.S.C. section 360bbb-3(b)(1), unless the authorization is  terminated or revoked. Performed at [facility], [address] [place], [place] [address]   Aerobic/Anaerobic Culture (surgical/deep wound)     Status: None (Preliminary result)   Collection Time: 04/28/20  2:10 PM   Specimen: Wound; Tissue  Result Value Ref Range Status   Specimen Description   Final    WOUND Performed at [facility], [address] [place], [place] [address]    Special Requests   Final    TISSUE FROM SACRAL DECUBITIS WOUND Performed at [facility], [address] [place], [place] [address]    Gram Stain PENDING  Incomplete   Culture   Final    TOO YOUNG TO READ Performed at [facility], [place] [address] [place], [place] [address]    Report Status PENDING  Incomplete         Radiology Studies: [place] EKG SITE RITE  Result Date: 04/28/2020 If Site Rite image not attached, placement could not be confirmed due to current cardiac rhythm.       Scheduled Meds:  amiodarone  200 mg Oral Daily   vitamin C  250 mg Oral BID   aspirin EC  81 mg Oral Daily   calcium-vitamin D  2 tablet Oral Q breakfast   cholecalciferol  1,000 Units Oral Daily   citalopram  10 mg Oral Daily   docusate sodium  100 mg Oral Daily   enoxaparin (LOVENOX) injection  30 mg Subcutaneous Q24H   ferrous [id number] C-folic acid  1 capsule Oral BID PC   hydroxychloroquine  400 mg Oral Daily   latanoprost  1 drop Both Eyes QHS   megestrol  400 mg Oral Daily   mirtazapine  15 mg Oral QHS   multivitamin-lutein  1 capsule Oral Daily   potassium chloride  20 mEq Oral Daily   potassium chloride  40 mEq Oral Once   pravastatin  40 mg Oral QHS   predniSONE  2.5 mg Oral Q breakfast   Continuous Infusions:  sodium chloride 75 mL/hr at 04/29/20 0446   ampicillin-sulbactam (UNASYN) IV 3 g (04/29/20 0446)     LOS: 2 days    Time spent:25 mins. More than 50% of that time was spent in counseling and/or coordination of care.      Deatra James, MD [facility] P10/14/2021, 10:36 AM

## 2020-04-29 NOTE — Progress Notes (Signed)
Peripherally Inserted Central Catheter Placement  The IV Nurse has discussed with the patient and/or persons authorized to consent for the patient, the purpose of this procedure and the potential benefits and risks involved with this procedure.  The benefits include less needle sticks, lab draws from the catheter, and the patient may be discharged home with the catheter. Risks include, but not limited to, infection, bleeding, blood clot (thrombus formation), and puncture of an artery; nerve damage and irregular heartbeat and possibility to perform a PICC exchange if needed/ordered by physician.  Alternatives to this procedure were also discussed.  Bard Power PICC patient education guide, fact sheet on infection prevention and patient information card has been provided to patient /or left at bedside.  Verbal consent given due to patient's inability to physically sign consent.  PICC Placement Documentation  PICC Double Lumen 04/29/20 PICC Right Brachial 34 cm 2 cm (Active)  Indication for Insertion or Continuance of Line Prolonged intravenous therapies;Poor Vasculature-patient has had multiple peripheral attempts or PIVs lasting less than 24 hours 04/29/20 1144  Exposed Catheter (cm) 2 cm 04/29/20 1144  Site Assessment Clean;Dry;Intact 04/29/20 1144  Lumen #1 Status Flushed;Saline locked;Blood return noted 04/29/20 1144  Lumen #2 Status Flushed;Saline locked;Blood return noted 04/29/20 1144  Dressing Type Transparent 04/29/20 1144  Dressing Status Clean;Dry;Intact 04/29/20 1144  Antimicrobial disc in place? Yes 04/29/20 1144  Safety Lock Not Applicable 77/82/42 3536  Line Care Connections checked and tightened 04/29/20 1144  Line Adjustment (NICU/IV Team Only) No 04/29/20 1144  Dressing Intervention New dressing 04/29/20 1144  Dressing Change Due 05/06/20 04/29/20 Motley, Nicolette Bang 04/29/2020, 11:45 AM

## 2020-04-29 NOTE — Anesthesia Postprocedure Evaluation (Signed)
Anesthesia Post Note  Patient: LINSIE LUPO  Procedure(s) Performed: INCISION AND DRAINAGE ABSCESS (N/A )  Patient location during evaluation: PACU Anesthesia Type: General Level of consciousness: awake and alert Pain management: pain level controlled Vital Signs Assessment: post-procedure vital signs reviewed and stable Respiratory status: spontaneous breathing, nonlabored ventilation, respiratory function stable and patient connected to nasal cannula oxygen Cardiovascular status: blood pressure returned to baseline and stable Postop Assessment: no apparent nausea or vomiting Anesthetic complications: no   No complications documented.   Last Vitals:  Vitals:   04/29/20 0007 04/29/20 0513  BP: (!) 92/51 (!) 100/55  Pulse: 68 71  Resp: 16 16  Temp: 36.5 C 36.5 C  SpO2: 99% 99%    Last Pain:  Vitals:   04/29/20 0513  TempSrc: Oral  PainSc:                  Molli Barrows

## 2020-04-30 DIAGNOSIS — Z515 Encounter for palliative care: Secondary | ICD-10-CM

## 2020-04-30 DIAGNOSIS — A419 Sepsis, unspecified organism: Secondary | ICD-10-CM | POA: Diagnosis not present

## 2020-04-30 DIAGNOSIS — Z7189 Other specified counseling: Secondary | ICD-10-CM

## 2020-04-30 DIAGNOSIS — F4321 Adjustment disorder with depressed mood: Secondary | ICD-10-CM | POA: Diagnosis not present

## 2020-04-30 DIAGNOSIS — Z792 Long term (current) use of antibiotics: Secondary | ICD-10-CM | POA: Diagnosis not present

## 2020-04-30 DIAGNOSIS — I5042 Chronic combined systolic (congestive) and diastolic (congestive) heart failure: Secondary | ICD-10-CM | POA: Diagnosis not present

## 2020-04-30 DIAGNOSIS — R531 Weakness: Secondary | ICD-10-CM | POA: Diagnosis not present

## 2020-04-30 DIAGNOSIS — N390 Urinary tract infection, site not specified: Secondary | ICD-10-CM | POA: Diagnosis not present

## 2020-04-30 DIAGNOSIS — Z66 Do not resuscitate: Secondary | ICD-10-CM

## 2020-04-30 LAB — CBC
HCT: 27.3 % — ABNORMAL LOW (ref 36.0–46.0)
Hemoglobin: 8 g/dL — ABNORMAL LOW (ref 12.0–15.0)
MCH: 27.8 pg (ref 26.0–34.0)
MCHC: 29.3 g/dL — ABNORMAL LOW (ref 30.0–36.0)
MCV: 94.8 fL (ref 80.0–100.0)
Platelets: 423 10*3/uL — ABNORMAL HIGH (ref 150–400)
RBC: 2.88 MIL/uL — ABNORMAL LOW (ref 3.87–5.11)
RDW: 20.3 % — ABNORMAL HIGH (ref 11.5–15.5)
WBC: 11.4 10*3/uL — ABNORMAL HIGH (ref 4.0–10.5)
nRBC: 0.5 % — ABNORMAL HIGH (ref 0.0–0.2)

## 2020-04-30 LAB — BASIC METABOLIC PANEL
Anion gap: 8 (ref 5–15)
BUN: 15 mg/dL (ref 8–23)
CO2: 20 mmol/L — ABNORMAL LOW (ref 22–32)
Calcium: 7.3 mg/dL — ABNORMAL LOW (ref 8.9–10.3)
Chloride: 118 mmol/L — ABNORMAL HIGH (ref 98–111)
Creatinine, Ser: 0.84 mg/dL (ref 0.44–1.00)
GFR, Estimated: >60 mL/min (ref >60)
Glucose, Bld: 119 mg/dL — ABNORMAL HIGH (ref 70–99)
Potassium: 3.8 mmol/L (ref 3.5–5.1)
Sodium: 146 mmol/L — ABNORMAL HIGH (ref 135–145)

## 2020-04-30 LAB — URINE CULTURE: Culture: 40000 — AB

## 2020-04-30 MED ORDER — ENSURE ENLIVE PO LIQD
237.0000 mL | Freq: Three times a day (TID) | ORAL | Status: DC
  Administered 2020-04-30 – 2020-05-10 (×12): 237 mL via ORAL

## 2020-04-30 MED ORDER — HYDROMORPHONE HCL 1 MG/ML IJ SOLN
0.5000 mg | Freq: Once | INTRAMUSCULAR | Status: AC
  Administered 2020-04-30: 0.5 mg via INTRAVENOUS
  Filled 2020-04-30: qty 0.5

## 2020-04-30 NOTE — Consult Note (Signed)
Consultation Note Date: 04/30/2020   Patient Name: Tina Salinas  DOB: 09-01-1945  MRN: [id number]  Age / Sex: 74 y.o., female   PCP: Birdie Sons, MD Referring Physician: Deatra James, MD   REASON FOR CONSULTATION:Establishing goals of care  Palliative Care consult requested for goals of care discussion in this 74 y.o. female with multiple medical problems including cerebral palsy, combined congestive heart failure, hypertension, CKD3, rheumatoid arthritis, hyperlipidemia, proximal A. Fib. Patient presented to the ED with complaints of increasing pain, generalized weakness, and failure to thrive. She is nonambulatory. Patient spent several days pending SNF placement, however required admission due to UTI. Per notations patient is under the care of APS as she fired home health providers. Since admission patient has been evaluated by Surgery due to unstageable sacral decubitus. Debridement performed with wound vac in place. Psychiatry evaluated and deemed patient oriented with capacity to make decisions.   Clinical Assessment and Goals of Care: I have reviewed medical records including lab results, imaging, Epic notes, and MAR, received report from the bedside RN, and assessed the patient. I met at the bedside with Tina Salinas  to discuss diagnosis prognosis, GOC, EOL wishes, disposition and options.  Patient is awake, alert and oriented x3. She complains of sacral and leg pain. RN administered pain medication as requested. Patient tolerated with applesauce.   Tina Salinas is known to myself and the Palliative team from previous admissions. She verbalized her remembrance of me and expressed her appreciation of "consistency". I re-introduced Palliative Medicine as specialized medical care for people living with serious illness. It focuses on providing relief from the symptoms and stress of a serious illness. The goal is to improve quality of life for both the patient and the family. She  verbalized understanding.   Prior to admission patient reports she was receiving some care by home health but felt they were taking advantage of her and not doing exactly as she asked. She shares that her friend [name] has been assisting he at times but states "we are on the outs. What people say and what they actually do is two different things". Her husband passed away over 15 years ago and she sates she has no other family that she speaks with.   We discussed Her current illness and what it means in the larger context of Her on-going co-morbidities. Natural disease trajectory and expectations at EOL were discussed.  I expressed openly with Tina Salinas my concerns for her quality of life and poor prognosis. She verbalized her understanding of her illness and co-morbidities but states she doesn't want to talk about the negatives only the positives.  I attempted to elicit values and goals of care important to the patient.    The difference between aggressive medical intervention and comfort care was considered in light of the patient's goals of care.   Tina Salinas is clear in her expressed goals to continue to treat the treatable and her wishes to be placed at a facility close to [facility] where she was raised. She understands the CSW team/DSS is working towards placement.   Tina Salinas becomes tearful expressing her need for facility placement. She reports she is ok with going to a facility but her heart is broken knowing she will have to surrender all of her assets that her husband left her. She states "I probably won't live much longer when I am there!" Therapeutic listening and support provided.   Advanced directives, concepts specific to  code status, artifical feeding and hydration, and rehospitalization were considered and discussed. Patient does not have a documented advanced directive. She confirms wishes for no artificial feedings or hydration. Patient reports she does not want anyone to make any  medical decisions for her. She reports if she declined to the point she could not speak for herself she wished to be allowed to pass away comfortably as per recommendations by the medical team. She reports her friend [name] can be called with updates however she could not make decisions and again stating the medical team would need to decide on what was best for her. She declines to complete documentation of wishes expressing "I am telling you now, put it in your documents!"   I discussed at length patient's full code status with consideration to her current illness and co-morbidities. We discussed what a code situation would look like for her and poor prognosis for survival. Tina Salinas verbalized understanding and expressed wishes for DNR/DNI. Education provided on what a DNR would look like for her allowing her to naturally pass away without heroic measures. She verbalized understanding again confirming wishes for DNR.   Hospice and Palliative Care services outpatient were explained and offered. Patient and family verbalized their understanding and awareness of both palliative and hospice's goals and philosophy of care. Patient verbalized her understanding and is currently declining both Palliative and Hospice support outpatient. She states she would like for the inpatient team to continue to support while hospitalized.   Questions and concerns were addressed.  Tina Salinas was encouraged to call with questions or concerns.  PMT will continue to support holistically.   SOCIAL HISTORY:     reports that she has never smoked. She has never used smokeless tobacco. She reports that she does not drink alcohol and does not use drugs.  CODE STATUS: DNR  ADVANCE DIRECTIVES:  Patient    SYMPTOM MANAGEMENT:per attending   Palliative Prophylaxis:   Bowel Regimen, Frequent Pain Assessment, Oral Care and Palliative Wound Care  PSYCHO-SOCIAL/SPIRITUAL:  Support System: Family  Desire for further Chaplaincy  support:NO   Additional Recommendations (Limitations, Scope, Preferences):  No Artificial Feeding, No Tracheostomy and treat the treatable, DNR/DNI  Education on hospice/palliative    PAST MEDICAL HISTORY: Past Medical History:  Diagnosis Date  . Adenoma   . CAP (community acquired pneumonia) 11/16/2017  . Cerebral palsy ([place])   . CHF (congestive heart failure) ([place])   . Femur fracture ([place])   . History of blood transfusion   . Hypertension   . Osteoarthritis   . Osteoporosis   . Rheumatoid arthritis ([facility])     ALLERGIES:  is allergic to etodolac, sulfa antibiotics, and tetracycline.   MEDICATIONS:  Current Facility-Administered Medications  Medication Dose Route Frequency Provider Last Rate Last Admin  . 0.9 %  sodium chloride infusion   Intravenous Continuous Olean Ree, MD 75 mL/hr at 04/29/20 2107 New Bag at 04/29/20 2107  . amiodarone (PACERONE) tablet 200 mg  200 mg Oral Daily Piscoya, Jose, MD   200 mg at 04/30/20 1016  . Ampicillin-Sulbactam (UNASYN) 3 g in sodium chloride 0.9 % 100 mL IVPB  3 g Intravenous Q6H Piscoya, Jose, MD 200 mL/hr at 04/30/20 1324 3 g at 04/30/20 1324  . ascorbic acid (VITAMIN C) tablet 250 mg  250 mg Oral BID Piscoya, Jose, MD   250 mg at 04/30/20 1016  . aspirin EC tablet 81 mg  81 mg Oral Daily Piscoya, Jose, MD   81 mg at  04/30/20 1015  . calcium-vitamin D (OSCAL WITH D) 500-200 MG-UNIT per tablet 2 tablet  2 tablet Oral Q breakfast Piscoya, Jose, MD   2 tablet at 04/30/20 1016  . Chlorhexidine Gluconate Cloth 2 % PADS 6 each  6 each Topical Daily Shahmehdi, Seyed A, MD      . cholecalciferol (VITAMIN D3) tablet 1,000 Units  1,000 Units Oral Daily Olean Ree, MD   1,000 Units at 04/30/20 1015  . citalopram (CELEXA) tablet 10 mg  10 mg Oral Daily Piscoya, Jose, MD   10 mg at 04/30/20 1016  . docusate sodium (COLACE) capsule 100 mg  100 mg Oral Daily Piscoya, Jose, MD   100 mg at 04/30/20 1015  . enoxaparin (LOVENOX) injection 30 mg  30 mg  Subcutaneous Q24H Piscoya, Jose, MD   30 mg at 04/29/20 2108  . ferrous [id number] C-folic acid (TRINSICON / FOLTRIN) capsule 1 capsule  1 capsule Oral BID PC Piscoya, Jose, MD   1 capsule at 04/30/20 1016  . HYDROcodone-acetaminophen (NORCO) 7.5-325 MG per tablet 1-2 tablet  1-2 tablet Oral Q6H PRN Olean Ree, MD   2 tablet at 04/30/20 1329  . hydroxychloroquine (PLAQUENIL) tablet 400 mg  400 mg Oral Daily Piscoya, Jose, MD   400 mg at 04/30/20 1016  . latanoprost (XALATAN) 0.005 % ophthalmic solution 1 drop  1 drop Both Eyes QHS Piscoya, Jose, MD   1 drop at 04/29/20 2108  . loperamide (IMODIUM) capsule 2 mg  2 mg Oral Q2H PRN Piscoya, Jose, MD      . megestrol (MEGACE) 400 MG/10ML suspension 400 mg  400 mg Oral Daily Piscoya, Jose, MD   400 mg at 04/30/20 1016  . mirtazapine (REMERON) tablet 15 mg  15 mg Oral QHS Piscoya, Jose, MD   15 mg at 04/29/20 2108  . multivitamin-lutein (OCUVITE-LUTEIN) capsule 1 capsule  1 capsule Oral Daily Piscoya, Jose, MD   1 capsule at 04/29/20 1236  . potassium chloride SA (KLOR-CON) CR tablet 20 mEq  20 mEq Oral Daily Piscoya, Jose, MD   20 mEq at 04/30/20 1016  . potassium chloride SA (KLOR-CON) CR tablet 40 mEq  40 mEq Oral Once Piscoya, Jose, MD      . pravastatin (PRAVACHOL) tablet 40 mg  40 mg Oral QHS Piscoya, Jose, MD   40 mg at 04/29/20 2108  . predniSONE (DELTASONE) tablet 2.5 mg  2.5 mg Oral Q breakfast Piscoya, Jose, MD   2.5 mg at 04/30/20 1016  . sodium chloride flush (NS) 0.9 % injection 10-40 mL  10-40 mL Intracatheter Q12H Shahmehdi, Seyed A, MD   10 mL at 04/30/20 1028  . sodium chloride flush (NS) 0.9 % injection 10-40 mL  10-40 mL Intracatheter PRN Shahmehdi, Seyed A, MD        VITAL SIGNS: BP 100/63 (BP Location: Left Arm)   Pulse 80   Temp 97.7 F (36.5 C) (Oral)   Resp 16   Ht 5' (1.524 m)   Wt 43 kg   SpO2 97%   BMI 18.51 kg/m  Filed Weights   04/21/20 1238 04/28/20 1251  Weight: 43 kg 43 kg    Estimated body mass  index is 18.51 kg/m as calculated from the following:   Height as of this encounter: 5' (1.524 m).   Weight as of this encounter: 43 kg.  LABS: CBC:    Component Value Date/Time   WBC 11.4 (H) 04/30/2020 0515   HGB 8.0 (L) 04/30/2020 0515  HGB 12.3 03/01/2018 1404   HCT 27.3 (L) 04/30/2020 0515   HCT 36.6 03/01/2018 1404   PLT 423 (H) 04/30/2020 0515   PLT 221 03/01/2018 1404   Comprehensive Metabolic Panel:    Component Value Date/Time   NA 146 (H) 04/30/2020 0515   NA 142 09/01/2019 0000   K 3.8 04/30/2020 0515   BUN 15 04/30/2020 0515   BUN 17 09/01/2019 0000   CREATININE 0.84 04/30/2020 0515   ALBUMIN 2.2 (L) 04/26/2020 1041   ALBUMIN 4.1 07/15/2018 1532     Review of Systems  Musculoskeletal: Positive for arthralgias.  Skin: Positive for wound.  Neurological: Positive for weakness.  Unless otherwise noted, a complete review of systems is negative.  Physical Exam General: NAD, frail chronically-ill appearing Cardiovascular: regular rate and rhythm Pulmonary:  diminished bilaterally  Abdomen: soft, nontender, + bowel sounds Extremities: RA, head shifted to right side   Skin: unstageable decub ulcer (not observed) Neurological: awake, alert and oriented x3, mood appropriate   Prognosis: < 6 months patient with poor prognosis in the setting of cerebral palsy, combined CHF, malnutrition, sepsis, deconditioned, unstageable sacral decub with wound vac, CAD, a-fib, poor po intake, and bedbound.    Discharge Planning:  To Be Determined  Recommendations: . DNR/DNI-as requested and confirmed by Tina Salinas . NO PEG . Continue with current plan of care per medical team  . Patient states she is hopeful for facility placement near her hometown of [place]. Wishes to continue to treat the treatable.  . Reports if her health declines to the point she is unable to make her own decisions, she wishes to be comfortable and allow the medical team to make final decisions. Does  not have family and noone she wants to make any decisions for her. Can call her friend [name] and provide updates but she cannot make any decisions.  . Discussed at length with her regarding poor prognosis. She verbalized understanding also expressing she chooses not to think or talk about the "negative" things and only wants to discuss and speak on the positives.  . Declines Palliative and hospice support outpatient.  Marland Kitchen PMT will continue to support and follow. Please call team line with urgent needs.   Palliative Performance Scale: PPS 30%              Patient expressed understanding and was in agreement with this plan.   Thank you for allowing the Palliative Medicine Team to assist in the care of this patient.  Time In: 1310 Time Out: 1400 Time Total: 50 min.   Visit consisted of counseling and education dealing with the complex and emotionally intense issues of symptom management and palliative care in the setting of serious and potentially life-threatening illness.Greater than 50%  of this time was spent counseling and coordinating care related to the above assessment and plan.  Signed by:  Alda Lea, AGPCNP-BC Palliative Medicine Team  Phone: [phone] Pager: [phone] Amion: Bjorn Pippin

## 2020-04-30 NOTE — TOC Progression Note (Signed)
Transition of Care [facility]) - Progression Note    Patient Details  Name: Tina Salinas MRN: [id number] Date of Birth: 1945/09/23  Transition of Care [facility]) CM/SW [place], LCSW Phone Number: 04/30/2020, 10:44 AM  Clinical Narrative: Received call from patient's DSS social worker, Raymond Gurney. She came to hospital to speak with patient about placement. Patient is agreeable to placement and willing to use her stocks and bonds to pay for it if needed. Patient's preference is for placement in the [place] area because that is where she's from originally.    Expected Discharge Plan: Long Term Nursing Home Barriers to Discharge: ED Patient Insisting on an Alternate Living Situation/Facility  Expected Discharge Plan and Services Expected Discharge Plan: Long Term Nursing Home In-house Referral: Clinical Social Work   Post Acute Care Choice: Nursing Home Living arrangements for the past 2 months: Single Family Home                                       Social Determinants of Health (SDOH) Interventions    Readmission Risk Interventions No flowsheet data found.

## 2020-04-30 NOTE — Progress Notes (Signed)
[facility] Day(s): 3.   Post op day(s): 2 Days Post-Op.   Interval History:  Patient seen and examined no acute events or new complaints overnight.  Patient reports biggest complaint is ache in her right leg Mild leukocytosis this morning to 11.4K, no fevers Renal function normal, sCr - 0.84, UO - 300 CX from OR growing gram (-) rods; awaiting final report and susceptibilities; remains on Unasyn Wound vac with 50 ccs out in last 24 hours Plan for MWF wound vac changes at bedside if feasible  Vital signs in last 24 hours: [min-max] current  Temp:  [97.5 F (36.4 C)-98.1 F (36.7 C)] 97.7 F (36.5 C) (10/15 0450) Pulse Rate:  [75-85] 85 (10/15 0450) Resp:  [16-24] 18 (10/15 0450) BP: (105-120)/(59-73) 112/72 (10/15 0450) SpO2:  [96 %-98 %] 96 % (10/15 0450)     Height: 5' (152.4 cm) Weight: 43 kg BMI (Calculated): 18.51   Intake/Output last 2 shifts:  10/14 0701 - 10/15 0700 In: 2536 [P.O.:360; I.V.:675; IV Piggyback:200] Out: 350 [Urine:300; Drains:50]   Physical Exam:  Constitutional: alert, cooperative and no distress  Respiratory: breathing non-labored at rest  Cardiovascular: regular rate and sinus rhythm Musculoskeletal:Chronic atrophy to extremities secondary to CP Integumentary: Large aprox. 15-10 cm wound to the sacrum, s/p debridement, stage III, no evidence of further necrosis or purulence.   Sacral Wound (04/30/2020):      Labs:  CBC Latest Ref Rng & Units 04/30/2020 04/29/2020 04/28/2020  WBC 4.0 - 10.5 K/uL 11.4(H) 5.9 8.7  Hemoglobin 12.0 - 15.0 g/dL 8.0(L) 8.3(L) 8.3(L)  Hematocrit 36 - 46 % 27.3(L) 28.6(L) 27.6(L)  Platelets 150 - 400 K/uL 423(H) 304 293   CMP Latest Ref Rng & Units 04/30/2020 04/29/2020 04/28/2020  Glucose 70 - 99 mg/dL 119(H) 120(H) 79  BUN 8 - 23 mg/dL 15 9 10   Creatinine 0.44 - 1.00 mg/dL 0.84 0.64 0.64  Sodium 135 - 145 mmol/L 146(H) 145 142  Potassium 3.5 - 5.1 mmol/L 3.8  3.9 2.9(L)  Chloride 98 - 111 mmol/L 118(H) 117(H) 115(H)  CO2 22 - 32 mmol/L 20(L) 20(L) 20(L)  Calcium 8.9 - 10.3 mg/dL 7.3(L) 7.0(L) 7.5(L)  Total Protein 6.5 - 8.1 g/dL - - -  Total Bilirubin 0.3 - 1.2 mg/dL - - -  Alkaline Phos 38 - 126 U/L - - -  AST 15 - 41 U/L - - -  ALT 0 - 44 U/L - - -     Imaging studies: No new pertinent imaging studies   Assessment/Plan: 74 y.o. female  2 Days Post-Op s/p excisional debridement of sacral decubitus wound, including skin, subcutaneous tissue, and muscle, for total area of 15 x 10 cm and application of wound vac, complicated by pertinent comorbidities including cerebral palsy   - Appreciate WOC RN assistance with wound vac change today; will plan on bedside changes MWF              - Continue Abx; follow up Cx from OR; growing gram (-) rods             - Local wound care: Continue local dressings, frequent repositioning, low air loss mattress - Pain control prn              - Further management per primary service   All of the above findings and recommendations were discussed with the patient, and the medical team, and all of patient's questions were answered to her expressed satisfaction.  --  Edison Simon, PA-C [facility] 04/30/2020, 7:23 AM [phone] M-F: 7am - 4pm

## 2020-04-30 NOTE — Progress Notes (Signed)
PROGRESS NOTE    Tina Salinas  KKX:[id number] DOB: 17-Feb-1946 DOA: 04/21/2020 PCP: Birdie Sons, MD   Chief Complain: Weakness, placement issue   Subjective: The patient was seen and examined this morning, nursing staff assisting patient with feeling poor p.o. intake noted Patient complaining of generalized aches and pain, especially with movement manipulation.  She is now open to the idea of SNF placement.  Postop day #2 Status post debridement of sacral decubitus stage III wound Stable    Brief Narrative:  Patient is a 74 y.o. female with medical history significant of cerebral palsy, congestive heart failure, hypertension, rheumatoid arthritis, hyperlipidemia, proximal A. fib who was brought from home with complaints of generalized weakness, failure to thrive and psychiatric evaluation.  She is bedbound at baseline and is under Adult Scientist, forensic.  She is cared by home health aides but recently started firing them and denied their service.  Patient was in the emergency department for last few days waiting for placement in a skilled nursing facility.  During her stay in ED, ED physicians suspected that she developed sepsis secondary to UTI and requested hospital admission.  Currently she is waiting for placement.SW following.  Assessment & Plan:   Active Problems:   Adjustment disorder with depressed mood   Infantile cerebral palsy (HCC)   Chronic combined systolic and diastolic CHF (congestive heart failure) (HCC)   Malnutrition of moderate degree   Hypokalemia   Pressure injury of skin   Sepsis ([place])   Suspected Sepsis/urinary tract infection:  -Sepsis physiology resolved, hemodynamically stable  Possible large sacral decubitus contributing -status post debridement postop day 2  -Continue gentle IV fluid hydration, IV antibiotics -Unasyn  -Lactic acid 2.0 >> 1.1 -WBC 13.7 >> 8.7 >> 5.9  Urine culture sent few days ago showed multiple species.  Repeat  urine culture. Urinalysis was suggestive of  UTI.    She also has some hematuria as per UA, but her urine is grossly clear. Patient denies dysuria.   We will discontinue ceftriaxone .  Blood cultures sent few days ago did not show any growth.     Stage III sacral ulcer- POA: Postop day #2 surgery-I&D and debridement of the sacral decubitus today 04/28/2020 Tolerated procedure well  Another possible source of sepsis.  Has a chronic sacral ulcer with eschar .  Continue wound care, wound care nurse has been following.    As per the wound nurse evaluation, there was some foul smelling discharge.  -Continue antibiotics -Unasyn  Sacral Wound (04/30/2020):        Severe hypokalemia:   -Tolerating p.o., correcting orally Likely related to diarrhea - K  2.9>> 3.9   Diarrhea:  -Improving Has been having diarrhea since last few days.    Negative GI pathogen panel.... Negative Abdomen right soft nontender nondistended  Imodium PRN  Severe systolic congestive heart failure:  -Remained stable-denies shortness of breath or edema Was euvolemic or dehydrated.   Echocardiogram done on 03/31/2020 showed ejection fraction of less than 20%.  On Lasix at home which is on hold due to soft blood pressure and  diarrhea.  Coronary artery disease: Cardiac cath done on 9/21 showed nonobstructive coronary artery disease. On aspirin Remained stable  History of paroxysmal A. fib: Currently in normal sinus rhythm.  On amiodarone.  On carvedilol for rate control, currently on hold.  Rate well controlled.  Hypertension -was hypotensive on admission -Blood pressure has improved, stabilized -Still holding BP meds, gentle as needed IV fluid  History of rheumatoid arthritis:  On prednisone, methotrexate, hydroxychloroquine.  Continue prednisone and hydroxychloroquine.  Adjustment disorder with depressed mood: Remained stable  Started on Celexa during this hospitalization.  Continue Remeron.   Psychiatry seen the patient and did not recommend inpatient psychiatric admission.  Hyperlipidemia: On pravastatin  Infantile cerebral palsy: Bedbound .  Has contractures on the lower extremities.  Continue supportive care.  Debility/deconditioning/placement issues:  Patient stating from home, repeating help Discussed with nursing staff social worker and patient she would like to return home with home health, possible med  She is on under Adult Protective Services.   We will continue to communicate with social worker for placement decision.   Nutrition Problem: Severe Malnutrition Etiology:  (cerebral palsy, CHF, chronic wounds)      DVT prophylaxis:Lovenox Code Status: Full Family Communication: Friend present at the bedside Status is: Observation   Dispo: The patient is from: Home              Anticipated d/c is to: SNF   Appreciate social worker assisting, in contact with patient's DSS social worker, Raymond Gurney.              Anticipated d/c date is: Likely Monday, 05/03/2020              Patient currently is not medically stable for discharge Awaiting general surgery evaluation for the wound   Consultants: None  Procedures: None  Antimicrobials:  Anti-infectives (From admission, onward)   Start     Dose/Rate Route Frequency Ordered Stop   04/28/20 1000  cefTRIAXone (ROCEPHIN) 1 g in sodium chloride 0.9 % 100 mL IVPB  Status:  Discontinued        1 g 200 mL/hr over 30 Minutes Intravenous Every 24 hours 04/27/20 1358 04/27/20 1406   04/28/20 0600  Ampicillin-Sulbactam (UNASYN) 3 g in sodium chloride 0.9 % 100 mL IVPB  Status:  Discontinued        3 g 200 mL/hr over 30 Minutes Intravenous On call to O.R. 04/27/20 1750 04/27/20 1754   04/28/20 0000  Ampicillin-Sulbactam (UNASYN) 3 g in sodium chloride 0.9 % 100 mL IVPB        3 g 200 mL/hr over 30 Minutes Intravenous Every 6 hours 04/27/20 1800     04/27/20 1445  amoxicillin-clavulanate (AUGMENTIN) 875-125 MG per  tablet 1 tablet  Status:  Discontinued        1 tablet Oral Every 12 hours 04/27/20 1404 04/27/20 1754   04/27/20 1000  cefTRIAXone (ROCEPHIN) 1 g in sodium chloride 0.9 % 100 mL IVPB  Status:  Discontinued        1 g 200 mL/hr over 30 Minutes Intravenous Every 24 hours 04/26/20 1729 04/27/20 1358   04/26/20 1645  cefTRIAXone (ROCEPHIN) 1 g in sodium chloride 0.9 % 100 mL IVPB        1 g 200 mL/hr over 30 Minutes Intravenous  Once 04/26/20 1636 04/26/20 1734   04/22/20 1000  hydroxychloroquine (PLAQUENIL) tablet 400 mg        400 mg Oral Daily 04/22/20 0209     04/21/20 2345  cephALEXin (KEFLEX) capsule 250 mg  Status:  Discontinued        250 mg Oral Every 8 hours 04/21/20 2343 04/26/20 1729        Objective: Vitals:   04/30/20 0006 04/30/20 0450 04/30/20 1012 04/30/20 1103  BP: 113/73 112/72 115/77 100/63  Pulse: 83 85 80 80  Resp: 20 18 17  16  Temp: 97.8 F (36.6 C) 97.7 F (36.5 C) (!) 97.5 F (36.4 C) 97.7 F (36.5 C)  TempSrc: Oral   Oral  SpO2: 96% 96% (!) 88% 97%  Weight:      Height:        Intake/Output Summary (Last 24 hours) at 04/30/2020 1156 Last data filed at 04/30/2020 1022 Gross per 24 hour  Intake 1235 ml  Output 350 ml  Net 885 ml   Filed Weights   04/21/20 1238 04/28/20 1251  Weight: 43 kg 43 kg      Physical Exam:   General:  Alert, oriented,  cooperative, chronically cachectic, ill looking female  HEENT:  Normocephalic, PERRL, otherwise with in Normal limits   Neuro:  CNII-XII intact. , normal motor and sensation, reflexes intact   Lungs:   Clear to auscultation BL, Respirations unlabored, no wheezes / crackles  Cardio:    S1/S2, RRR, No murmure, No Rubs or Gallops   Abdomen:   Soft, non-tender, bowel sounds active all four quadrants,  no guarding or peritoneal signs.  Muscular skeletal:   Contracted third lower extremities, Limited exam - in bed, able to move all 2 extremities, reduced strength globally 2+ pulses,  symmetric, No pitting  edema  Skin:  Dry, warm to touch, negative for any Rashes, sacral open wound decubitus  Wounds: Please see nursing documentation Pressure Injury 04/22/20 Buttocks Right Unstageable - Full thickness tissue loss in which the base of the injury is covered by slough (yellow, tan, gray, green or brown) and/or eschar (tan, brown or black) in the wound bed. Yellow sough noted to R side,  (Active)  04/22/20 1059  Location: Buttocks  Location Orientation: Right  Staging: Unstageable - Full thickness tissue loss in which the base of the injury is covered by slough (yellow, tan, gray, green or brown) and/or eschar (tan, brown or black) in the wound bed.  Wound Description (Comments): Yellow sough noted to R side, black eschar noted to L side, yellow/brown drainage noted from yellow slough.   Present on Admission: Yes     Pressure Injury 04/22/20 Hip Left Deep Tissue Pressure Injury - Purple or maroon localized area of discolored intact skin or blood-filled blister due to damage of underlying soft tissue from pressure and/or shear. (Active)  04/22/20 1102  Location: Hip  Location Orientation: Left  Staging: Deep Tissue Pressure Injury - Purple or maroon localized area of discolored intact skin or blood-filled blister due to damage of underlying soft tissue from pressure and/or shear.  Wound Description (Comments):   Present on Admission: Yes     Pressure Injury 04/22/20 N/A Right Stage 1 -  Intact skin with non-blanchable redness of a localized area usually over a bony prominence. (Active)  04/22/20 1103  Location: N/A (Rib cage)  Location Orientation: Right  Staging: Stage 1 -  Intact skin with non-blanchable redness of a localized area usually over a bony prominence.  Wound Description (Comments):   Present on Admission: Yes     Pressure Injury 04/22/20 N/A Left Stage 1 -  Intact skin with non-blanchable redness of a localized area usually over a bony prominence. (Active)  04/22/20 1104  Location:  N/A (Rib cage)  Location Orientation: Left  Staging: Stage 1 -  Intact skin with non-blanchable redness of a localized area usually over a bony prominence.  Wound Description (Comments):   Present on Admission: Yes     Pressure Injury 04/22/20 Heel Right Stage 2 -  Partial thickness loss of dermis  presenting as a shallow open injury with a red, pink wound bed without slough. (Active)  04/22/20 1106  Location: Heel  Location Orientation: Right  Staging: Stage 2 -  Partial thickness loss of dermis presenting as a shallow open injury with a red, pink wound bed without slough.  Wound Description (Comments):   Present on Admission: Yes     Pressure Injury 04/22/20 Heel Left Stage 2 -  Partial thickness loss of dermis presenting as a shallow open injury with a red, pink wound bed without slough. (Active)  04/22/20 1107  Location: Heel  Location Orientation: Left  Staging: Stage 2 -  Partial thickness loss of dermis presenting as a shallow open injury with a red, pink wound bed without slough.  Wound Description (Comments):   Present on Admission: Yes     Pressure Injury 04/22/20 Buttocks Left Stage 4 - Full thickness tissue loss with exposed bone, tendon or muscle. (Active)  04/22/20   Location: Buttocks  Location Orientation: Left  Staging: Stage 4 - Full thickness tissue loss with exposed bone, tendon or muscle.  Wound Description (Comments):   Present on Admission: Yes     Sacral Wound (04/30/2020):          Data Reviewed: I have personally reviewed following labs and imaging studies  CBC: Recent Labs  Lab 04/26/20 1041 04/27/20 0628 04/28/20 0458 04/29/20 0313 04/30/20 0515  WBC 12.1* 13.7* 8.7 5.9 11.4*  NEUTROABS 10.1* 10.8* 6.2  --   --   HGB 9.6* 8.5* 8.3* 8.3* 8.0*  HCT 31.5* 27.6* 27.6* 28.6* 27.3*  MCV 91.3 91.4 92.3 95.0 94.8  PLT 285 311 293 304 932*   Basic Metabolic Panel: Recent Labs  Lab 04/26/20 1041 04/27/20 0628 04/28/20 0458 04/29/20 0313  04/30/20 0515  NA 140 139 142 145 146*  K 2.9* 3.6 2.9* 3.9 3.8  CL 107 110 115* 117* 118*  CO2 25 20* 20* 20* 20*  GLUCOSE 125* 94 79 120* 119*  BUN 18 13 10 9 15   CREATININE 0.78 0.66 0.64 0.64 0.84  CALCIUM 8.3* 7.7* 7.5* 7.0* 7.3*  MG 1.9  --  1.7  --   --    GFR: Estimated Creatinine Clearance: 41.1 mL/min (by C-G formula based on SCr of 0.84 mg/dL). Liver Function Tests: Sepsis Labs: Recent Labs  Lab 04/26/20 1531 04/26/20 2056  LATICACIDVEN 2.0* 1.1    Recent Results (from the past 240 hour(s))  Blood culture (routine single)     Status: None   Collection Time: 04/21/20  1:00 PM   Specimen: BLOOD  Result Value Ref Range Status   Specimen Description BLOOD BLOOD RIGHT FOREARM  Final   Special Requests   Final    BOTTLES DRAWN AEROBIC AND ANAEROBIC Blood Culture results may not be optimal due to an inadequate volume of blood received in culture bottles   Culture   Final    NO GROWTH 5 DAYS Performed at [facility], [address] [place], [place] [address]    Report Status 04/26/2020 FINAL  Final  Urine culture     Status: Abnormal   Collection Time: 04/21/20 11:00 PM   Specimen: In/Out Cath Urine  Result Value Ref Range Status   Specimen Description   Final    IN/OUT CATH URINE Performed at [facility], [address] [place],  [address]    Special Requests   Final    NONE Performed at [facility], [address] [place], [place]  [address]    Culture MULTIPLE SPECIES PRESENT, SUGGEST RECOLLECTION (A)  Final   Report Status 04/22/2020 FINAL  Final  Urine culture     Status: Abnormal   Collection Time: 04/26/20  2:58 PM   Specimen: Urine, Random  Result Value Ref Range Status   Specimen Description   Final    URINE, RANDOM Performed at [facility], [address] [place], [place] [address]    Special Requests   Final    NONE Performed at [facility], [address] [place], [place]  [address]    Culture MULTIPLE SPECIES PRESENT, SUGGEST RECOLLECTION (A)  Final   Report Status 04/27/2020 FINAL  Final  Gastrointestinal Panel by PCR , Stool     Status: None   Collection Time: 04/26/20  2:58 PM   Specimen: Stool  Result Value Ref Range Status   Campylobacter species NOT DETECTED NOT DETECTED Final   Plesimonas shigelloides NOT DETECTED NOT DETECTED Final   Salmonella species NOT DETECTED NOT DETECTED Final   Yersinia enterocolitica NOT DETECTED NOT DETECTED Final   Vibrio species NOT DETECTED NOT DETECTED Final   Vibrio cholerae NOT DETECTED NOT DETECTED Final   Enteroaggregative E coli (EAEC) NOT DETECTED NOT DETECTED Final   Enteropathogenic E coli (EPEC) NOT DETECTED NOT DETECTED Final   Enterotoxigenic E coli (ETEC) NOT DETECTED NOT DETECTED Final   Shiga like toxin producing E coli (STEC) NOT DETECTED NOT DETECTED Final   Shigella/Enteroinvasive E coli (EIEC) NOT DETECTED NOT DETECTED Final   Cryptosporidium NOT DETECTED NOT DETECTED Final   Cyclospora cayetanensis NOT DETECTED NOT DETECTED Final   Entamoeba histolytica NOT DETECTED NOT DETECTED Final   Giardia lamblia NOT DETECTED NOT DETECTED Final   Adenovirus F40/41 NOT DETECTED NOT DETECTED Final   Astrovirus NOT DETECTED NOT DETECTED Final   Norovirus GI/GII NOT DETECTED NOT DETECTED Final   Rotavirus A NOT DETECTED NOT DETECTED Final   Sapovirus (I, II, IV, and V) NOT DETECTED NOT DETECTED Final    Comment: Performed at [facility], [address] [place], [place] [address]  Respiratory Panel by RT PCR (Flu A&B, Covid) - Nasopharyngeal Swab     Status: None   Collection Time: 04/26/20  3:51 PM   Specimen: Nasopharyngeal Swab  Result Value Ref Range Status   SARS Coronavirus 2 by RT PCR NEGATIVE NEGATIVE Final    Comment: (NOTE) SARS-CoV-2 target nucleic acids are NOT DETECTED.  The SARS-CoV-2 RNA is generally detectable in upper respiratoy specimens during the acute phase of infection. The  lowest concentration of SARS-CoV-2 viral copies this assay can detect is 131 copies/mL. A negative result does not preclude SARS-Cov-2 infection and should not be used as the sole basis for treatment or other patient management decisions. A negative result may occur with  improper specimen collection/handling, submission of specimen other than nasopharyngeal swab, presence of viral mutation(s) within the areas targeted by this assay, and inadequate number of viral copies (<131 copies/mL). A negative result must be combined with clinical observations, patient history, and epidemiological information. The expected result is Negative.  Fact Sheet for Patients:  PinkCheek.be  Fact Sheet for Healthcare Providers:  GravelBags.it  This test is no t yet approved or cleared by the [place] FDA and  has been authorized for detection and/or diagnosis of SARS-CoV-2 by FDA under an Emergency Use Authorization (EUA). This EUA will remain  in effect (meaning this test can be used) for the duration of the COVID-19 declaration  under Section 564(b)(1) of the Act, 21 U.S.C. section 360bbb-3(b)(1), unless the authorization is terminated or revoked sooner.     Influenza A by PCR NEGATIVE NEGATIVE Final   Influenza B by PCR NEGATIVE NEGATIVE Final    Comment: (NOTE) The Xpert Xpress SARS-CoV-2/FLU/RSV assay is intended as an aid in  the diagnosis of influenza from Nasopharyngeal swab specimens and  should not be used as a sole basis for treatment. Nasal washings and  aspirates are unacceptable for Xpert Xpress SARS-CoV-2/FLU/RSV  testing.  Fact Sheet for Patients: PinkCheek.be  Fact Sheet for Healthcare Providers: GravelBags.it  This test is not yet approved or cleared by the [place] FDA and  has been authorized for detection and/or diagnosis of SARS-CoV-2 by  FDA under  an Emergency Use Authorization (EUA). This EUA will remain  in effect (meaning this test can be used) for the duration of the  Covid-19 declaration under Section 564(b)(1) of the Act, 21  U.S.C. section 360bbb-3(b)(1), unless the authorization is  terminated or revoked. Performed at [facility], [address] [place], [place] [address]   Aerobic/Anaerobic Culture (surgical/deep wound)     Status: None (Preliminary result)   Collection Time: 04/28/20  2:10 PM   Specimen: Wound; Tissue  Result Value Ref Range Status   Specimen Description   Final    WOUND Performed at [facility], [address], [place], [place] [address]    Special Requests   Final    TISSUE FROM SACRAL DECUBITIS WOUND Performed at [facility], [address] [place], [place] [address]    Gram Stain   Final    ABUNDANT WBC PRESENT, PREDOMINANTLY PMN FEW GRAM NEGATIVE RODS    Culture   Final    FEW PSEUDOMONAS AERUGINOSA SUSCEPTIBILITIES TO FOLLOW CULTURE REINCUBATED FOR BETTER GROWTH Performed at [facility], [address], [place], [place] [address]    Report Status PENDING  Incomplete  Urine Culture     Status: Abnormal   Collection Time: 04/29/20  4:30 AM   Specimen: Urine, Random  Result Value Ref Range Status   Specimen Description   Final    URINE, RANDOM Performed at [facility], [address] [place], [place] [address]    Special Requests   Final    NONE Performed at [facility], [address] [place], [place] [address]    Culture 40,000 COLONIES/mL YEAST (A)  Final   Report Status 04/30/2020 FINAL  Final         Radiology Studies: No results found.      Scheduled Meds: . amiodarone  200 mg Oral Daily  . vitamin C  250 mg Oral BID  . aspirin EC  81 mg Oral Daily  . calcium-vitamin D  2 tablet Oral Q breakfast  . Chlorhexidine Gluconate Cloth  6 each Topical Daily  . cholecalciferol  1,000 Units Oral Daily  .  citalopram  10 mg Oral Daily  . docusate sodium  100 mg Oral Daily  . enoxaparin (LOVENOX) injection  30 mg Subcutaneous Q24H  . ferrous [id number] C-folic acid  1 capsule Oral BID PC  . hydroxychloroquine  400 mg Oral Daily  . latanoprost  1 drop Both Eyes QHS  . megestrol  400 mg Oral Daily  . mirtazapine  15 mg Oral QHS  . multivitamin-lutein  1 capsule Oral Daily  . potassium chloride  20 mEq Oral Daily  . potassium chloride  40 mEq Oral Once  .  pravastatin  40 mg Oral QHS  . predniSONE  2.5 mg Oral Q breakfast  . sodium chloride flush  10-40 mL Intracatheter Q12H   Continuous Infusions: . sodium chloride 75 mL/hr at 04/29/20 2107  . ampicillin-sulbactam (UNASYN) IV 3 g (04/30/20 0509)     LOS: 3 days    Time spent:25 mins. More than 50% of that time was spent in counseling and/or coordination of care.      Deatra James, MD [facility] P10/15/2021, 11:56 AM

## 2020-04-30 NOTE — Progress Notes (Signed)
Nutrition Follow Up Note   DOCUMENTATION CODES:   Severe malnutrition in context of chronic illness  INTERVENTION:   Ensure Enlive po TID, each supplement provides 350 kcal and 20 grams of protein  Vital Cuisine TID, each supplement provides 520kcal and 22g of protein.   Magic cup TID with meals, each supplement provides 290 kcal and 9 grams of protein  Ocuvite daily for wound healing (provides zinc, vitamin A, vitamin C, Vitamin E, copper, and selenium)  Vitamin C 27m po BID  NUTRITION DIAGNOSIS:   Severe Malnutrition related to  (cerebral palsy, CHF, chronic wounds) as evidenced by severe fat depletion, severe muscle depletion.  GOAL:   Patient will meet greater than or equal to 90% of their needs  -progressing   MONITOR:   PO intake, Supplement acceptance, Labs, Weight trends, Skin, I & O's  REASON FOR ASSESSMENT:   Consult Assessment of nutrition requirement/status  ASSESSMENT:   74y.o. female with medical history significant for CAD s/p stent angioplasty, CHF with preserved LVEF, peripheral vascular disease, rheumatoid arthritis, stage III CKD, atrial fibrillation, NSTEMI, HTN, bedbound and cerebral palsy who is admitted with sepsis, UTI and sacral wound   Pt s/p I & D with [facility] 10/13  Met with pt in room today. Pt reports poor appetite and oral intake in hospital. Pt currently eating ~25% of meals. RD discussed with pt about the importance of adequate protein needed for wound healing. Pt reports that she is willing to drink vanilla Ensure in hospital; pt is requesting to have 3 per day. Pt also reports that she enjoys [employer] these are being sent up on her meal trays. RD will add Ensure supplements to help pt meet her estimated needs. Continue Magic Cups and Vital Cuisine. Recommended for pt to continue vitamin C and daily MVI after discharge.   Medications reviewed and include: vitamin C, aspirin, oscal w/ D, D3, celexa, colace, lovenox, ferrous fumerate, BT02  Vit C, folic acid, megace, remeron, KCl, prednisone, NaCl @75ml /hr, unasyn  Labs reviewed: Na 146(H), K 3.8 wnl Wbc- 11.4(H), Hgb 8.0(L), Hct 27.3(L)  Diet Order:   Diet Order            Diet regular Room service appropriate? Yes; Fluid consistency: Thin  Diet effective now                EDUCATION NEEDS:   No education needs have been identified at this time  Skin:  Skin Assessment: Reviewed RN Assessment (Stage IV sacrum 8X13X.8cm)  Last BM:  10/12  Height:   Ht Readings from Last 1 Encounters:  04/28/20 5' (1.524 m)    Weight:   Wt Readings from Last 1 Encounters:  04/28/20 43 kg    Ideal Body Weight:  45.4 kg  BMI:  Body mass index is 18.51 kg/m.  Estimated Nutritional Needs:   Kcal:  1300-1500kcal/day  Protein:  65-75g/day  Fluid:  1-1.2L/day  CKoleen DistanceMS, RD, LDN Please refer to [facility] and/or RD on-call/weekend/after hours pager

## 2020-04-30 NOTE — TOC Progression Note (Signed)
Transition of Care [facility]) - Progression Note    Patient Details  Name: Tina Salinas MRN: [id number] Date of Birth: 1945-08-22  Transition [facility]) CM/SW Contact  Beverly Sessions, RN Phone Number: 04/30/2020, 2:58 PM  Clinical Narrative:    Have requested for new therapy notes PT/OT eval pending  Spoke with [facility] at [facility] and [facility] - and sent referral via email [address] [place], [place] [address] [phone]  [place] left at extension [address] [place], [place] [address] [phone]  [facility] Jolene Schimke Will send to [facility]  [place], [place] [address] [phone]   Expected Discharge Plan: Long Term Nursing Home Barriers to Discharge: ED Patient Insisting on an Alternate Living Situation/Facility  Expected Discharge Plan and Services Expected Discharge Plan: [place] In-house Referral: Clinical Social Work   Post Acute Care Choice: Nursing Home Living arrangements for the past 2 months: Single Family Home                                       Social Determinants of Health (SDOH) Interventions    Readmission Risk Interventions No flowsheet data found.

## 2020-04-30 NOTE — Consult Note (Addendum)
[place] Nurse wound follow up Surgery following for assessment and plan of care for sacrum wound; surgery was performed on 10/13 for debridement. Surgical team at the bedside to assess wound appearance during first post-op dressing change. 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  Applied barrier ring to the lower edge of the wound near the rectum, to attempt to maintain a seal.  Applied one piece of black foam to 141mm cont suction and bridged track pad to hip to reduce pressure to the location.  Pt is on a low airloss mattress. [place] team will plan to perform the dressing change Q M/W/F. Julien Girt MSN, RN, [place], [place], [place]

## 2020-04-30 NOTE — Progress Notes (Signed)
Sepsis screen performed and is negative. Will continue to monitor. 

## 2020-05-01 DIAGNOSIS — F4321 Adjustment disorder with depressed mood: Secondary | ICD-10-CM | POA: Diagnosis not present

## 2020-05-01 DIAGNOSIS — Z792 Long term (current) use of antibiotics: Secondary | ICD-10-CM | POA: Diagnosis not present

## 2020-05-01 DIAGNOSIS — N390 Urinary tract infection, site not specified: Secondary | ICD-10-CM | POA: Diagnosis not present

## 2020-05-01 DIAGNOSIS — R531 Weakness: Secondary | ICD-10-CM | POA: Diagnosis not present

## 2020-05-01 LAB — CBC
HCT: 30.8 % — ABNORMAL LOW (ref 36.0–46.0)
Hemoglobin: 8.8 g/dL — ABNORMAL LOW (ref 12.0–15.0)
MCH: 28 pg (ref 26.0–34.0)
MCHC: 28.6 g/dL — ABNORMAL LOW (ref 30.0–36.0)
MCV: 98.1 fL (ref 80.0–100.0)
Platelets: 394 10*3/uL (ref 150–400)
RBC: 3.14 MIL/uL — ABNORMAL LOW (ref 3.87–5.11)
RDW: 21.2 % — ABNORMAL HIGH (ref 11.5–15.5)
WBC: 11.5 10*3/uL — ABNORMAL HIGH (ref 4.0–10.5)
nRBC: 1 % — ABNORMAL HIGH (ref 0.0–0.2)

## 2020-05-01 LAB — BASIC METABOLIC PANEL
Anion gap: 6 (ref 5–15)
BUN: 14 mg/dL (ref 8–23)
CO2: 19 mmol/L — ABNORMAL LOW (ref 22–32)
Calcium: 7.3 mg/dL — ABNORMAL LOW (ref 8.9–10.3)
Chloride: 119 mmol/L — ABNORMAL HIGH (ref 98–111)
Creatinine, Ser: 0.8 mg/dL (ref 0.44–1.00)
GFR, Estimated: >60 mL/min (ref >60)
Glucose, Bld: 105 mg/dL — ABNORMAL HIGH (ref 70–99)
Potassium: 4.6 mmol/L (ref 3.5–5.1)
Sodium: 144 mmol/L (ref 135–145)

## 2020-05-01 MED ORDER — OXYCODONE HCL ER 10 MG PO T12A
10.0000 mg | EXTENDED_RELEASE_TABLET | Freq: Two times a day (BID) | ORAL | Status: DC
  Administered 2020-05-01 – 2020-05-04 (×8): 10 mg via ORAL
  Filled 2020-05-01 (×8): qty 1

## 2020-05-01 MED ORDER — DEXTROSE 5 % IV SOLN: INTRAVENOUS | Status: AC

## 2020-05-01 NOTE — Progress Notes (Addendum)
Physical Therapy Treatment and Re-Evaluation Patient Details Name: Tina Salinas MRN: [id number] DOB: 1945-12-14 Today's Date: 05/01/2020    History of Present Illness presented to ER secondary to progressive SOB, chest pain; admitted for management of NSTEMI. Sacral wound addressed in OR, wound vac placed.    PT Comments    Pt alert, agreeable to attempt with PT. Denied pain at rest, but with any attempts to touch/reposition LLE pt with significant pain signs/symptoms and quickly became agitated. The patient was able to perform several UE exercises AAROM/AROM with PT. Pt also able to wiggle toes and minimal ankle pump bilaterally. R knee flexion/extension and hip abduction/adduction performed on RLE AAROM x10 ea direction. Pt towards end of session reported she could do as much as she can, but still adamant about not moving/repositioning LLE despite education and encouragement.  Overall the patient demonstrated deficits (see "PT Problem List") that impede the patient's functional abilities, safety, and mobility and would benefit from skilled PT intervention. Recommendation of SNF and current goals for POC remains appropriate.   Follow Up Recommendations  SNF     Equipment Recommendations  None recommended by PT    Recommendations for Other Services       Precautions / Restrictions Precautions Precautions: Fall Precaution Comments: Stage III sacral wound, wound vac Restrictions Weight Bearing Restrictions: No    Mobility  Bed Mobility               General bed mobility comments: deferred due to elevated pain, very limited tolerance for movement/repositioning  Transfers                 General transfer comment: unsafe/unable at this time  Ambulation/Gait                 Stairs             Wheelchair Mobility    Modified Rankin (Stroke Patients Only)       Balance       Sitting balance - Comments: deferred                                     Cognition Arousal/Alertness: Awake/alert Behavior During Therapy: WFL for tasks assessed/performed Overall Cognitive Status: Within Functional Limits for tasks assessed                                        Exercises Other Exercises Other Exercises: Pt educated on benefit of repositioning, especially for LLE, pt adamentely denied any LLE movement at this time by PT. Other Exercises: AAROM/AROM shoulder flexion, abduction/adduction, elbow flexion/extension and squeezing of bilateral hands performed on L and R, x10 ea Other Exercises: Pt able to wiggle toes bilaterally, very small ankle pumps bilaterally x10, R knee flexion AAROM x10, R hip abduction/adduction AAROM x10    General Comments        Pertinent Vitals/Pain Pain Assessment: Faces Pain Location: with touching LLE Pain Intervention(s): Limited activity within patient's tolerance;Monitored during session;Repositioned    Home Living Family/patient expects to be discharged to:: Private residence Living Arrangements: Alone Available Help at Discharge: Friend(s);Available PRN/intermittently Type of Home: House Home Access: Ramped entrance   Home Layout: One level Home Equipment: Walker - 2 wheels      Prior Function Level of Independence: Needs assistance  Comments: At baseline, mod indep for ADLs, household and limited community mobilization, driving (up until March, 2021).  Since March, has experienced progressive decline to the point of being largely bed/WC bound (reports using BSC for toileting needs)   PT Goals (current goals can now be found in the care plan section) Acute Rehab PT Goals Patient Stated Goal: to walk again Time For Goal Achievement: 05/15/20 Potential to Achieve Goals: Fair Progress towards PT goals: Progressing toward goals    Frequency    Min 2X/week      PT Plan Current plan remains appropriate    Co-evaluation              AM-PAC PT "6  Clicks" Mobility   Outcome Measure  Help needed turning from your back to your side while in a flat bed without using bedrails?: Total Help needed moving from lying on your back to sitting on the side of a flat bed without using bedrails?: Total Help needed moving to and from a bed to a chair (including a wheelchair)?: Total Help needed standing up from a chair using your arms (e.g., wheelchair or bedside chair)?: Total Help needed to walk in hospital room?: Total Help needed climbing 3-5 steps with a railing? : Total 6 Click Score: 6    End of Session Equipment Utilized During Treatment: Gait belt Activity Tolerance: Patient limited by pain Patient left: in bed;with call bell/phone within reach Nurse Communication: Mobility status PT Visit Diagnosis: Muscle weakness (generalized) (M62.81);Pain Pain - Right/Left: Left Pain - part of body: Leg     Time: 1962-2297 PT Time Calculation (min) (ACUTE ONLY): 20 min  Charges:  $Therapeutic Exercise: 8-22 mins                     Lieutenant Diego PT, DPT 10:11 AM,05/01/20

## 2020-05-01 NOTE — Evaluation (Signed)
Occupational Therapy Evaluation Patient Details Name: Tina Salinas MRN: [id number] DOB: [birth year] Today's Date: 05/01/2020    History of Present Illness Pt is 74 y/o F with PMH: cerebral palsy and combined hert failure. Pt presented to ER secondary to progressive SOB, chest pain; admitted for management of NSTEMI. Sacral wound addressed in OR, wound vac placed.   Clinical Impression   Pt was seen for OT evaluation this date. Prior to hospital admission, pt reports being MOD I for fxl mobility and ADLs using RW, occasionally w/c and bed side commode. Pt reports living alone. Currently pt demonstrates impairments as described below (See OT problem list) which functionally limit her ability to perform ADL/self-care tasks. Pt currently requires MOD A for bed-level self grooming and feeding d/t gross weakness of UEs in addition to c/o pain with all attempts to optimize positioning for better participation/access to bed side table. Pt declines attempting mobilization on OT assessment citing pain.  Pt would benefit from skilled OT services to address noted impairments and functional limitations (see below for any additional details) in order to maximize safety and independence while minimizing falls risk and caregiver burden. Upon hospital discharge, recommend STR to maximize pt safety and return to PLOF.     Follow Up Recommendations  SNF    Equipment Recommendations  Other (comment) (defer to next level of care)    Recommendations for Other Services       Precautions / Restrictions Precautions Precautions: Fall Precaution Comments: Stage III sacral wound, wound vac Restrictions Weight Bearing Restrictions: No      Mobility Bed Mobility               General bed mobility comments: pt declines on OT assessment, citing pain  Transfers                 General transfer comment: unsafe/unable at this time    Balance       Sitting balance - Comments: deferred        Standing balance comment: deferred                           ADL either performed or assessed with clinical judgement   ADL Overall ADL's : Needs assistance/impaired Eating/Feeding: Moderate assistance;Bed level Eating/Feeding Details (indicate cue type and reason): HOB elevated, to take sip from cup of water without straw/lid Grooming: Oral care;Wash/dry face;Bed level;Moderate assistance Grooming Details (indicate cue type and reason): HOB elevated. Pt able to perform some aspects of these tasks, but not thoroughly, requries MOD A for pressure/thorough completion 2/2 weak grasp/limimted UE ROM. noted to have bits of food in gums/cheeks         Upper Body Dressing : Moderate assistance;Bed level Upper Body Dressing Details (indicate cue type and reason): based on clinical observation Lower Body Dressing: Total assistance;Bed level Lower Body Dressing Details (indicate cue type and reason): based on clinical observation   Toilet Transfer Details (indicate cue type and reason): pt declines to get OOB on assessment citing pain Toileting- Clothing Manipulation and Hygiene: Maximal assistance;Bed level Toileting - Clothing Manipulation Details (indicate cue type and reason): pt declines to get OOB on assessment citing pain             Vision Baseline Vision/History: Wears glasses Wears Glasses: At all times Patient Visual Report: No change from baseline       Perception     Praxis      Pertinent Vitals/Pain  Pain Assessment: Faces Faces Pain Scale: Hurts whole lot Pain Location: neck/trunk with attempts to improve in-bed positioning as pt is found to be R lateral leaning with Head/neck FWD. Up off pillows, appears to be effortful. Can relax back into neutral cervical spine some with cues. Pain Descriptors / Indicators: Aching;Grimacing;Guarding Pain Intervention(s): Limited activity within patient's tolerance;Monitored during session;Repositioned (pillows used under  R side including under R UE to support.)     Hand Dominance     Extremity/Trunk Assessment Upper Extremity Assessment Upper Extremity Assessment: Generalized weakness (shld ROm limited to ~1/3 flexion B'ly. Pt grossly with limited grasp as well. MMT of shld 3-/5, elbow/hand is 3+/5)   Lower Extremity Assessment Lower Extremity Assessment: Defer to PT evaluation;Generalized weakness       Communication Communication Communication: No difficulties   Cognition Arousal/Alertness: Awake/alert Behavior During Therapy: WFL for tasks assessed/performed Overall Cognitive Status: Difficult to assess                                 General Comments: pt able to follow simple 1-2 step commands. Cognition difficult to formally assess as pt declines to answer orientation questions when OT prompts stating "don't start with that." Similar reponse when asked questions about home setup/who she lives with. Difficult to ascertain true baseline.   General Comments       Exercises Other Exercises Other Exercises: OT faciltiates ed re: role of OT, importance of attempting to perform self care as I'ly as possible, importance of OOB activity. Pt with moderate understanding detected. Agreeable to participation, but not to OOB Activity. Other Exercises: AAROM/AROM shoulder flexion, abduction/adduction, elbow flexion/extension and squeezing of bilateral hands performed on L and R, x10 ea Other Exercises: Pt able to wiggle toes bilaterally, very small ankle pumps bilaterally x10, R knee flexion AAROM x10, R hip abduction/adduction AAROM x10   Shoulder Instructions      Home Living Family/patient expects to be discharged to:: Private residence Living Arrangements: Alone Available Help at Discharge: Friend(s);Available PRN/intermittently Type of Home: House Home Access: Ramped entrance     Home Layout: One level     Bathroom Shower/Tub: Walk-in shower;Curtain   Bathroom Toilet:  Standard Bathroom Accessibility: No (narrow)   Home Equipment: Walker - 2 wheels;Bedside commode          Prior Functioning/Environment Level of Independence: Needs assistance  Gait / Transfers Assistance Needed: Pt reports walking with walker, but unable to describe how recently. Endorses limited community mobilization. Mention of w/c use. ADL's / Homemaking Assistance Needed: Pt endorses struggling with self care, she is unwilling to answer much of PLOF questions asked. Difficult to assess cognition as pt states "don't start with that" when asked basic orientation beyond her name.   Comments: describes being largely wheelchair bound, unclear if she currently has w/c or not. Was driving up until March 2021.        OT Problem List: Decreased strength;Decreased range of motion;Decreased activity tolerance;Impaired balance (sitting and/or standing);Decreased coordination;Decreased knowledge of use of DME or AE;Decreased knowledge of precautions;Cardiopulmonary status limiting activity;Impaired UE functional use;Pain;Impaired tone      OT Treatment/Interventions: Self-care/ADL training;DME and/or AE instruction;Therapeutic activities;Balance training;Energy conservation;Therapeutic exercise;Patient/family education    OT Goals(Current goals can be found in the care plan section) Acute Rehab OT Goals Patient Stated Goal: to walk again OT Goal Formulation: With patient Time For Goal Achievement: 05/15/20 Potential to Achieve Goals: Fair ADL Goals Pt Will  Perform Eating: with set-up;sitting (supported sitting) Pt Will Perform Grooming: with set-up;sitting (to perform 2-3 g/h tasks to increase fxl activity tolernace.) Pt Will Perform Upper Body Dressing: with min assist;sitting Pt/caregiver will Perform Home Exercise Program: Increased ROM;Increased strength;Both right and left upper extremity;With minimal assist Additional ADL Goal #1: Pt will tolerate increased mobility assessment to  allow for further development of OT POC.  OT Frequency: Min 1X/week   Barriers to D/C: Decreased caregiver support          Co-evaluation              AM-PAC OT "6 Clicks" Daily Activity     Outcome Measure Help from another person eating meals?: A Little Help from another person taking care of personal grooming?: A Lot Help from another person toileting, which includes using toliet, bedpan, or urinal?: Total Help from another person bathing (including washing, rinsing, drying)?: A Lot Help from another person to put on and taking off regular upper body clothing?: A Lot Help from another person to put on and taking off regular lower body clothing?: Total 6 Click Score: 11   End of Session Nurse Communication: Other (comment) (participation status with OT this session)  Activity Tolerance: Patient limited by fatigue;Patient limited by pain Patient left: in bed;with call bell/phone within reach;with bed alarm set;with nursing/sitter in room (nurse present to hang IV abx)  OT Visit Diagnosis: Unsteadiness on feet (R26.81);Other abnormalities of gait and mobility (R26.89);Muscle weakness (generalized) (M62.81);Pain Pain - part of body:  (back/trunk/cervical area)                Time: 9532-0233 OT Time Calculation (min): 26 min Charges:  OT General Charges $OT Visit: 1 Visit OT Evaluation $OT Eval Moderate Complexity: 1 Mod OT Treatments $Self Care/Home Management : 8-22 mins  Gerrianne Scale, MS, OTR/L ascom [phone] 05/01/20, 1:10 PM

## 2020-05-01 NOTE — Progress Notes (Signed)
PROGRESS NOTE    Tina Salinas  SAY:[id number] DOB: 1946/04/28 DOA: 04/21/2020 PCP: Birdie Sons, MD   Chief Complain: Weakness, placement issue   Subjective: The patient was seen and examined this morning, stable complaining of leg pain, otherwise awake alert, Participating, cooperating with oral intake  Patient had a successful meeting with palliative care team yesterday, agreed to DNR/DNI status, agreed with social worker to be placed to SNF... Requested [place] facility  Postop day #3 Status post debridement of sacral decubitus stage III wound Stable    Brief Narrative:  Patient is a 74 y.o. female with medical history significant of cerebral palsy, congestive heart failure, hypertension, rheumatoid arthritis, hyperlipidemia, proximal A. fib who was brought from home with complaints of generalized weakness, failure to thrive and psychiatric evaluation.  She is bedbound at baseline and is under Adult Scientist, forensic.  She is cared by home health aides but recently started firing them and denied their service.  Patient was in the emergency department for last few days waiting for placement in a skilled nursing facility.  During her stay in ED, ED physicians suspected that she developed sepsis secondary to UTI and requested hospital admission.  Currently she is waiting for placement.SW following.  Assessment & Plan:   Active Problems:   Adjustment disorder with depressed mood   Infantile cerebral palsy (HCC)   Chronic combined systolic and diastolic CHF (congestive heart failure) (HCC)   Malnutrition of moderate degree   Hypokalemia   Pressure injury of skin   Sepsis ([place])   Suspected Sepsis/urinary tract infection:  -Hemodynamically stable, sepsis physiology resolved  Possible large sacral decubitus contributing -status post debridement postop day 2 -We will continue gentle IV fluid hydration, will be switched to D5 W due to elevated sodium -We will continue IV  antibiotics  -Unasyn  -Lactic acid 2.0 >> 1.1 -WBC 13.7 >> 8.7 >> 5.9 >> 11.5  Urine culture sent few days ago showed multiple species.  Repeat urine culture. Urinalysis was suggestive of  UTI.    She also has some hematuria as per UA, but her urine is grossly clear. Patient denies dysuria.   We will discontinue ceftriaxone .  Blood cultures sent few days ago did not show any growth.     Stage III sacral ulcer- POA: Postop day #3 surgery-I&D and debridement of the sacral decubitus today 04/28/2020 Tolerated procedure well  Another possible source of sepsis.  Has a chronic sacral ulcer with eschar .  Continue wound care, wound care nurse has been following.    As per the wound nurse evaluation, there was some foul smelling discharge. ... Status post debridement as above -Continue antibiotics -Unasyn  Sacral Wound (04/30/2020):        Severe hypokalemia:   -Tolerating p.o., correcting orally Likely related to diarrhea - K  2.9>> 3.9   Diarrhea:  -Improving Has been having diarrhea since last few days.    Negative GI pathogen panel.... Negative Abdomen right soft nontender nondistended  Imodium PRN  Severe systolic congestive heart failure:  -Gentle IV fluid hydration, avoiding volume overload Was euvolemic or dehydrated.   Echocardiogram done on 03/31/2020 showed ejection fraction of less than 20%.  On Lasix at home which is on hold due to soft blood pressure and  diarrhea.  Coronary artery disease: Cardiac cath done on 9/21 showed nonobstructive coronary artery disease. On aspirin Remained stable  History of paroxysmal A. fib: Currently in normal sinus rhythm.  On amiodarone.  On carvedilol for  rate control, currently on hold.  Rate well controlled.  Hypertension -was hypotensive on admission -Blood pressure has improved, stabilized -Still holding BP meds, gentle as needed IV fluid  History of rheumatoid arthritis:  On prednisone, methotrexate,  hydroxychloroquine.  Continue prednisone and hydroxychloroquine.  Adjustment disorder with depressed mood: Remained stable  Started on Celexa during this hospitalization.  Continue Remeron.  Psychiatry seen the patient and did not recommend inpatient psychiatric admission.  Hyperlipidemia: On pravastatin  Infantile cerebral palsy: Bedbound .  Has contractures on the lower extremities.  Continue supportive care.  Debility/deconditioning/placement issues:  Patient stating from home, repeating help Discussed with nursing staff social worker and patient she would like to return home with home health, possible med  She is on under Adult Protective Services.   We will continue to communicate with social worker for placement decision.   Nutrition Problem: Severe Malnutrition Etiology:  (cerebral palsy, CHF, chronic wounds)  Dietary consulted, dietary supplement initiated Body mass index is 18.51 kg/m.       DVT prophylaxis:Lovenox Code Status: Full Family Communication: Friend present at the bedside Status is: Observation   Dispo: The patient is from: Home              Anticipated d/c is to: SNF   Appreciate social worker assisting, in contact with patient's DSS social worker, Raymond Gurney.              Anticipated d/c date is: Likely Monday, 05/03/2020              Patient currently is not medically stable for discharge Awaiting general surgery evaluation for the wound   Consultants: None  Procedures: None  Antimicrobials:  Anti-infectives (From admission, onward)   Start     Dose/Rate Route Frequency Ordered Stop   04/28/20 1000  cefTRIAXone (ROCEPHIN) 1 g in sodium chloride 0.9 % 100 mL IVPB  Status:  Discontinued        1 g 200 mL/hr over 30 Minutes Intravenous Every 24 hours 04/27/20 1358 04/27/20 1406   04/28/20 0600  Ampicillin-Sulbactam (UNASYN) 3 g in sodium chloride 0.9 % 100 mL IVPB  Status:  Discontinued        3 g 200 mL/hr over 30 Minutes Intravenous On  call to O.R. 04/27/20 1750 04/27/20 1754   04/28/20 0000  Ampicillin-Sulbactam (UNASYN) 3 g in sodium chloride 0.9 % 100 mL IVPB        3 g 200 mL/hr over 30 Minutes Intravenous Every 6 hours 04/27/20 1800     04/27/20 1445  amoxicillin-clavulanate (AUGMENTIN) 875-125 MG per tablet 1 tablet  Status:  Discontinued        1 tablet Oral Every 12 hours 04/27/20 1404 04/27/20 1754   04/27/20 1000  cefTRIAXone (ROCEPHIN) 1 g in sodium chloride 0.9 % 100 mL IVPB  Status:  Discontinued        1 g 200 mL/hr over 30 Minutes Intravenous Every 24 hours 04/26/20 1729 04/27/20 1358   04/26/20 1645  cefTRIAXone (ROCEPHIN) 1 g in sodium chloride 0.9 % 100 mL IVPB        1 g 200 mL/hr over 30 Minutes Intravenous  Once 04/26/20 1636 04/26/20 1734   04/22/20 1000  hydroxychloroquine (PLAQUENIL) tablet 400 mg        400 mg Oral Daily 04/22/20 0209     04/21/20 2345  cephALEXin (KEFLEX) capsule 250 mg  Status:  Discontinued        250 mg Oral Every 8  hours 04/21/20 2343 04/26/20 1729        Objective: Vitals:   04/30/20 2330 05/01/20 0444 05/01/20 0825 05/01/20 1131  BP: 115/70 113/69  110/75  Pulse: 80 78  88  Resp: 20 18  16   Temp: 98.4 F (36.9 C) 98.1 F (36.7 C)  98.3 F (36.8 C)  TempSrc: Oral Oral  Oral  SpO2: 98% 97% 96% 98%  Weight:      Height:        Intake/Output Summary (Last 24 hours) at 05/01/2020 1146 Last data filed at 05/01/2020 0900 Gross per 24 hour  Intake 500 ml  Output 350 ml  Net 150 ml   Filed Weights   04/21/20 1238 04/28/20 1251  Weight: 43 kg 43 kg       Physical Exam:   General:  Alert, oriented, cooperative, no distress;   HEENT:  Normocephalic, PERRL, otherwise with in Normal limits   Neuro:  CNII-XII intact. , normal motor and sensation, reflexes intact   Lungs:   Clear to auscultation BL, Respirations unlabored, no wheezes / crackles  Cardio:    S1/S2, RRR, No murmure, No Rubs or Gallops   Abdomen:   Soft, non-tender, bowel sounds active all four  quadrants,  no guarding or peritoneal signs.  Muscular skeletal:   Severe generalized cachexia, lower extremity contracted, limited contraction all 4 extremities --generalized reduced strength 2+ pulses,, No pitting edema  Skin:  Dry, warm to touch, negative for any Rashes, sacral decubitus  Wounds: Please see nursing documentation Pressure Injury 04/22/20 Buttocks Right Unstageable - Full thickness tissue loss in which the base of the injury is covered by slough (yellow, tan, gray, green or brown) and/or eschar (tan, brown or black) in the wound bed. Yellow sough noted to R side,  (Active)  04/22/20 1059  Location: Buttocks  Location Orientation: Right  Staging: Unstageable - Full thickness tissue loss in which the base of the injury is covered by slough (yellow, tan, gray, green or brown) and/or eschar (tan, brown or black) in the wound bed.  Wound Description (Comments): Yellow sough noted to R side, black eschar noted to L side, yellow/brown drainage noted from yellow slough.   Present on Admission: Yes     Pressure Injury 04/22/20 Hip Left Deep Tissue Pressure Injury - Purple or maroon localized area of discolored intact skin or blood-filled blister due to damage of underlying soft tissue from pressure and/or shear. (Active)  04/22/20 1102  Location: Hip  Location Orientation: Left  Staging: Deep Tissue Pressure Injury - Purple or maroon localized area of discolored intact skin or blood-filled blister due to damage of underlying soft tissue from pressure and/or shear.  Wound Description (Comments):   Present on Admission: Yes     Pressure Injury 04/22/20 N/A Right Stage 1 -  Intact skin with non-blanchable redness of a localized area usually over a bony prominence. (Active)  04/22/20 1103  Location: N/A (Rib cage)  Location Orientation: Right  Staging: Stage 1 -  Intact skin with non-blanchable redness of a localized area usually over a bony prominence.  Wound Description (Comments):    Present on Admission: Yes     Pressure Injury 04/22/20 N/A Left Stage 1 -  Intact skin with non-blanchable redness of a localized area usually over a bony prominence. (Active)  04/22/20 1104  Location: N/A (Rib cage)  Location Orientation: Left  Staging: Stage 1 -  Intact skin with non-blanchable redness of a localized area usually over a bony prominence.  Wound Description (Comments):   Present on Admission: Yes     Pressure Injury 04/22/20 Heel Right Stage 2 -  Partial thickness loss of dermis presenting as a shallow open injury with a red, pink wound bed without slough. (Active)  04/22/20 1106  Location: Heel  Location Orientation: Right  Staging: Stage 2 -  Partial thickness loss of dermis presenting as a shallow open injury with a red, pink wound bed without slough.  Wound Description (Comments):   Present on Admission: Yes     Pressure Injury 04/22/20 Heel Left Stage 2 -  Partial thickness loss of dermis presenting as a shallow open injury with a red, pink wound bed without slough. (Active)  04/22/20 1107  Location: Heel  Location Orientation: Left  Staging: Stage 2 -  Partial thickness loss of dermis presenting as a shallow open injury with a red, pink wound bed without slough.  Wound Description (Comments):   Present on Admission: Yes     Pressure Injury 04/22/20 Buttocks Left Stage 4 - Full thickness tissue loss with exposed bone, tendon or muscle. (Active)  04/22/20   Location: Buttocks  Location Orientation: Left  Staging: Stage 4 - Full thickness tissue loss with exposed bone, tendon or muscle.  Wound Description (Comments):   Present on Admission: Yes         Sacral Wound (04/30/2020):          Data Reviewed: I have personally reviewed following labs and imaging studies  CBC: Recent Labs  Lab 04/26/20 1041 04/26/20 1041 04/27/20 0628 04/28/20 0458 04/29/20 0313 04/30/20 0515 05/01/20 0317  WBC 12.1*   < > 13.7* 8.7 5.9 11.4* 11.5*  NEUTROABS  10.1*  --  10.8* 6.2  --   --   --   HGB 9.6*   < > 8.5* 8.3* 8.3* 8.0* 8.8*  HCT 31.5*   < > 27.6* 27.6* 28.6* 27.3* 30.8*  MCV 91.3   < > 91.4 92.3 95.0 94.8 98.1  PLT 285   < > 311 293 304 423* 394   < > = values in this interval not displayed.   Basic Metabolic Panel: Recent Labs  Lab 04/26/20 1041 04/26/20 1041 04/27/20 0628 04/28/20 0458 04/29/20 0313 04/30/20 0515 05/01/20 0317  NA 140   < > 139 142 145 146* 144  K 2.9*   < > 3.6 2.9* 3.9 3.8 4.6  CL 107   < > 110 115* 117* 118* 119*  CO2 25   < > 20* 20* 20* 20* 19*  GLUCOSE 125*   < > 94 79 120* 119* 105*  BUN 18   < > 13 10 9 15 14   CREATININE 0.78   < > 0.66 0.64 0.64 0.84 0.80  CALCIUM 8.3*   < > 7.7* 7.5* 7.0* 7.3* 7.3*  MG 1.9  --   --  1.7  --   --   --    < > = values in this interval not displayed.   GFR: Estimated Creatinine Clearance: 43.1 mL/min (by C-G formula based on SCr of 0.8 mg/dL). Liver Function Tests: Sepsis Labs: Recent Labs  Lab 04/26/20 1531 04/26/20 2056  LATICACIDVEN 2.0* 1.1    Recent Results (from the past 240 hour(s))  Blood culture (routine single)     Status: None   Collection Time: 04/21/20  1:00 PM   Specimen: BLOOD  Result Value Ref Range Status   Specimen Description BLOOD BLOOD RIGHT FOREARM  Final   Special Requests  Final    BOTTLES DRAWN AEROBIC AND ANAEROBIC Blood Culture results may not be optimal due to an inadequate volume of blood received in culture bottles   Culture   Final    NO GROWTH 5 DAYS Performed at [facility], [address] [place], [place] [address]    Report Status 04/26/2020 FINAL  Final  Urine culture     Status: Abnormal   Collection Time: 04/21/20 11:00 PM   Specimen: In/Out Cath Urine  Result Value Ref Range Status   Specimen Description   Final    IN/OUT CATH URINE Performed at [facility], [address] [place], [place] [address]    Special Requests   Final    NONE Performed at [facility], [address] [place], [place] [address]    Culture MULTIPLE SPECIES PRESENT, SUGGEST RECOLLECTION (A)  Final   Report Status 04/22/2020 FINAL  Final  Urine culture     Status: Abnormal   Collection Time: 04/26/20  2:58 PM   Specimen: Urine, Random  Result Value Ref Range Status   Specimen Description   Final    URINE, RANDOM Performed at [facility], [address], [place], [place] [address]    Special Requests   Final    NONE Performed at [facility], [address] [place], [place] [address]    Culture MULTIPLE SPECIES PRESENT, SUGGEST RECOLLECTION (A)  Final   Report Status 04/27/2020 FINAL  Final  Gastrointestinal Panel by PCR , Stool     Status: None   Collection Time: 04/26/20  2:58 PM   Specimen: Stool  Result Value Ref Range Status   Campylobacter species NOT DETECTED NOT DETECTED Final   Plesimonas shigelloides NOT DETECTED NOT DETECTED Final   Salmonella species NOT DETECTED NOT DETECTED Final   Yersinia enterocolitica NOT DETECTED NOT DETECTED Final   Vibrio species NOT DETECTED NOT DETECTED Final   Vibrio cholerae NOT DETECTED NOT DETECTED Final   Enteroaggregative E coli (EAEC) NOT DETECTED NOT DETECTED Final   Enteropathogenic E coli (EPEC) NOT DETECTED NOT DETECTED Final   Enterotoxigenic E coli (ETEC) NOT DETECTED NOT DETECTED Final   Shiga like toxin producing E coli (STEC) NOT DETECTED NOT DETECTED Final   Shigella/Enteroinvasive E coli (EIEC) NOT DETECTED NOT DETECTED Final   Cryptosporidium NOT DETECTED NOT DETECTED Final   Cyclospora cayetanensis NOT DETECTED NOT DETECTED Final   Entamoeba histolytica NOT DETECTED NOT DETECTED Final   Giardia lamblia NOT DETECTED NOT DETECTED Final   Adenovirus F40/41 NOT DETECTED NOT DETECTED Final   Astrovirus NOT DETECTED NOT DETECTED Final   Norovirus GI/GII NOT DETECTED NOT DETECTED Final   Rotavirus A NOT DETECTED NOT DETECTED Final   Sapovirus (I, II, IV, and V) NOT DETECTED NOT DETECTED  Final    Comment: Performed at [facility], [address] [place], [place] [address]  Respiratory Panel by RT PCR (Flu A&B, Covid) - Nasopharyngeal Swab     Status: None   Collection Time: 04/26/20  3:51 PM   Specimen: Nasopharyngeal Swab  Result Value Ref Range Status   SARS Coronavirus 2 by RT PCR NEGATIVE NEGATIVE Final    Comment: (NOTE) SARS-CoV-2 target nucleic acids are NOT DETECTED.  The SARS-CoV-2 RNA is generally detectable in upper respiratoy specimens during the acute phase of infection. The lowest concentration of SARS-CoV-2 viral copies this assay can detect is 131 copies/mL. A negative result does not preclude SARS-Cov-2 infection and should not  be used as the sole basis for treatment or other patient management decisions. A negative result may occur with  improper specimen collection/handling, submission of specimen other than nasopharyngeal swab, presence of viral mutation(s) within the areas targeted by this assay, and inadequate number of viral copies (<131 copies/mL). A negative result must be combined with clinical observations, patient history, and epidemiological information. The expected result is Negative.  Fact Sheet for Patients:  PinkCheek.be  Fact Sheet for Healthcare Providers:  GravelBags.it  This test is no t yet approved or cleared by the [place] FDA and  has been authorized for detection and/or diagnosis of SARS-CoV-2 by FDA under an Emergency Use Authorization (EUA). This EUA will remain  in effect (meaning this test can be used) for the duration of the COVID-19 declaration under Section 564(b)(1) of the Act, 21 U.S.C. section 360bbb-3(b)(1), unless the authorization is terminated or revoked sooner.     Influenza A by PCR NEGATIVE NEGATIVE Final   Influenza B by PCR NEGATIVE NEGATIVE Final    Comment: (NOTE) The Xpert Xpress SARS-CoV-2/FLU/RSV assay is intended as an  aid in  the diagnosis of influenza from Nasopharyngeal swab specimens and  should not be used as a sole basis for treatment. Nasal washings and  aspirates are unacceptable for Xpert Xpress SARS-CoV-2/FLU/RSV  testing.  Fact Sheet for Patients: PinkCheek.be  Fact Sheet for Healthcare Providers: GravelBags.it  This test is not yet approved or cleared by the [place] FDA and  has been authorized for detection and/or diagnosis of SARS-CoV-2 by  FDA under an Emergency Use Authorization (EUA). This EUA will remain  in effect (meaning this test can be used) for the duration of the  Covid-19 declaration under Section 564(b)(1) of the Act, 21  U.S.C. section 360bbb-3(b)(1), unless the authorization is  terminated or revoked. Performed at [facility], [address] [place], [place] [address]   Aerobic/Anaerobic Culture (surgical/deep wound)     Status: None (Preliminary result)   Collection Time: 04/28/20  2:10 PM   Specimen: Wound; Tissue  Result Value Ref Range Status   Specimen Description   Final    WOUND Performed at [facility], [address], [place], [place] [address]    Special Requests   Final    TISSUE FROM SACRAL DECUBITIS WOUND Performed at [facility], [address] [place], [place] [address]    Gram Stain   Final    ABUNDANT WBC PRESENT, PREDOMINANTLY PMN FEW GRAM NEGATIVE RODS    Culture   Final    FEW PSEUDOMONAS AERUGINOSA FEW ENTEROCOCCUS FAECALIS CULTURE REINCUBATED FOR BETTER GROWTH HOLDING FOR POSSIBLE ANAEROBE SUSCEPTIBILITIES TO FOLLOW Performed at [facility], [place] [address] [place], [place] [address]    Report Status PENDING  Incomplete  Urine Culture     Status: Abnormal   Collection Time: 04/29/20  4:30 AM   Specimen: Urine, Random  Result Value Ref Range Status   Specimen Description   Final    URINE, RANDOM Performed at [facility], [address], [place], [place] [address]    Special Requests   Final    NONE Performed at [facility], [address] [place],  [address]    Culture 40,000 COLONIES/mL YEAST (A)  Final   Report Status 04/30/2020 FINAL  Final         Radiology Studies: No results found.      Scheduled Meds: . amiodarone  200 mg Oral Daily  .  vitamin C  250 mg Oral BID  . aspirin EC  81 mg Oral Daily  . calcium-vitamin D  2 tablet Oral Q breakfast  . Chlorhexidine Gluconate Cloth  6 each Topical Daily  . cholecalciferol  1,000 Units Oral Daily  . citalopram  10 mg Oral Daily  . docusate sodium  100 mg Oral Daily  . enoxaparin (LOVENOX) injection  30 mg Subcutaneous Q24H  . feeding supplement  237 mL Oral TID BM  . ferrous [id number] C-folic acid  1 capsule Oral BID PC  . hydroxychloroquine  400 mg Oral Daily  . latanoprost  1 drop Both Eyes QHS  . megestrol  400 mg Oral Daily  . mirtazapine  15 mg Oral QHS  . multivitamin-lutein  1 capsule Oral Daily  . potassium chloride  20 mEq Oral Daily  . potassium chloride  40 mEq Oral Once  . pravastatin  40 mg Oral QHS  . predniSONE  2.5 mg Oral Q breakfast  . sodium chloride flush  10-40 mL Intracatheter Q12H   Continuous Infusions: . ampicillin-sulbactam (UNASYN) IV 3 g (05/01/20 0618)  . dextrose 75 mL/hr at 05/01/20 0914     LOS: 4 days    Time spent:25 mins. More than 50% of that time was spent in counseling and/or coordination of care.      Deatra James, MD [facility] P10/16/2021, 11:46 AM

## 2020-05-02 DIAGNOSIS — N390 Urinary tract infection, site not specified: Secondary | ICD-10-CM | POA: Diagnosis not present

## 2020-05-02 DIAGNOSIS — F4321 Adjustment disorder with depressed mood: Secondary | ICD-10-CM | POA: Diagnosis not present

## 2020-05-02 DIAGNOSIS — R531 Weakness: Secondary | ICD-10-CM | POA: Diagnosis not present

## 2020-05-02 DIAGNOSIS — Z792 Long term (current) use of antibiotics: Secondary | ICD-10-CM | POA: Diagnosis not present

## 2020-05-02 LAB — CBC
HCT: 27.5 % — ABNORMAL LOW (ref 36.0–46.0)
Hemoglobin: 8.2 g/dL — ABNORMAL LOW (ref 12.0–15.0)
MCH: 28.7 pg (ref 26.0–34.0)
MCHC: 29.8 g/dL — ABNORMAL LOW (ref 30.0–36.0)
MCV: 96.2 fL (ref 80.0–100.0)
Platelets: 416 10*3/uL — ABNORMAL HIGH (ref 150–400)
RBC: 2.86 MIL/uL — ABNORMAL LOW (ref 3.87–5.11)
RDW: 21.6 % — ABNORMAL HIGH (ref 11.5–15.5)
WBC: 12.5 10*3/uL — ABNORMAL HIGH (ref 4.0–10.5)
nRBC: 3.9 % — ABNORMAL HIGH (ref 0.0–0.2)

## 2020-05-02 LAB — AEROBIC/ANAEROBIC CULTURE W GRAM STAIN (SURGICAL/DEEP WOUND)

## 2020-05-02 LAB — BASIC METABOLIC PANEL
Anion gap: 6 (ref 5–15)
BUN: 14 mg/dL (ref 8–23)
CO2: 20 mmol/L — ABNORMAL LOW (ref 22–32)
Calcium: 7.5 mg/dL — ABNORMAL LOW (ref 8.9–10.3)
Chloride: 115 mmol/L — ABNORMAL HIGH (ref 98–111)
Creatinine, Ser: 0.83 mg/dL (ref 0.44–1.00)
GFR, Estimated: >60 mL/min (ref >60)
Glucose, Bld: 125 mg/dL — ABNORMAL HIGH (ref 70–99)
Potassium: 4.5 mmol/L (ref 3.5–5.1)
Sodium: 141 mmol/L (ref 135–145)

## 2020-05-02 MED ORDER — FUROSEMIDE 10 MG/ML IJ SOLN
20.0000 mg | INTRAMUSCULAR | Status: AC
  Administered 2020-05-02: 20 mg via INTRAVENOUS
  Filled 2020-05-02: qty 4

## 2020-05-02 MED ORDER — DEXTROSE 5 % IV SOLN: INTRAVENOUS | Status: DC

## 2020-05-02 MED ORDER — LEVALBUTEROL HCL 0.63 MG/3ML IN NEBU
0.6300 mg | INHALATION_SOLUTION | Freq: Three times a day (TID) | RESPIRATORY_TRACT | Status: DC
  Administered 2020-05-02 – 2020-05-05 (×11): 0.63 mg via RESPIRATORY_TRACT
  Filled 2020-05-02 (×14): qty 3

## 2020-05-02 MED ORDER — PIPERACILLIN-TAZOBACTAM 3.375 G IVPB
3.3750 g | Freq: Three times a day (TID) | INTRAVENOUS | Status: DC
  Administered 2020-05-02 – 2020-05-07 (×14): 3.375 g via INTRAVENOUS
  Filled 2020-05-02 (×13): qty 50

## 2020-05-02 MED ORDER — FLUCONAZOLE 100 MG PO TABS
200.0000 mg | ORAL_TABLET | Freq: Every day | ORAL | Status: AC
  Administered 2020-05-02 – 2020-05-04 (×3): 200 mg via ORAL
  Filled 2020-05-02 (×3): qty 2

## 2020-05-02 MED ORDER — IPRATROPIUM BROMIDE 0.02 % IN SOLN
0.5000 mg | Freq: Three times a day (TID) | RESPIRATORY_TRACT | Status: DC
  Administered 2020-05-02 – 2020-05-05 (×11): 0.5 mg via RESPIRATORY_TRACT
  Filled 2020-05-02 (×11): qty 2.5

## 2020-05-02 MED ORDER — DOCUSATE SODIUM 100 MG PO CAPS
100.0000 mg | ORAL_CAPSULE | Freq: Two times a day (BID) | ORAL | Status: DC
  Administered 2020-05-02 – 2020-05-09 (×9): 100 mg via ORAL
  Filled 2020-05-02 (×11): qty 1

## 2020-05-02 NOTE — Progress Notes (Addendum)
PROGRESS NOTE    Tina Salinas  OZH:[id number] DOB: 01/29/1946 DOA: 04/21/2020 PCP: Birdie Sons, MD   Chief Complain: Weakness, placement issue   Subjective: The patient was seen and examined this morning per patient and nursing staff poor appetite. Hemodynamically stable, no complaints No issues overnight   Confirmed DNR/DNI status, agreed with social worker to be placed to SNF... Requested [place] facility  Postop day #4 Status post debridement of sacral decubitus stage III wound Stable    Brief Narrative:  Patient is a 74 y.o. female with medical history significant of cerebral palsy, congestive heart failure, hypertension, rheumatoid arthritis, hyperlipidemia, proximal A. fib who was brought from home with complaints of generalized weakness, failure to thrive and psychiatric evaluation.  She is bedbound at baseline and is under Adult Scientist, forensic.  She is cared by home health aides but recently started firing them and denied their service.  Patient was in the emergency department for last few days waiting for placement in a skilled nursing facility.  During her stay in ED, ED physicians suspected that she developed sepsis secondary to UTI and requested hospital admission.  Currently she is waiting for placement.SW following.  Assessment & Plan:   Active Problems:   Adjustment disorder with depressed mood   Infantile cerebral palsy (HCC)   Chronic combined systolic and diastolic CHF (congestive heart failure) (HCC)   Malnutrition of moderate degree   Hypokalemia   Pressure injury of skin   Sepsis ([place])   Suspected Sepsis/urinary tract infection:  -Sepsis physiology resolved, stable  Possible large sacral decubitus contributing -status post debridement postop day 2 -We will continue gentle IV fluid hydration, will be switched to D5 W due to elevated sodium -We will continue IV antibiotics  -Unasyn  -Lactic acid 2.0 >> 1.1 -WBC 13.7 >> 8.7 >> 5.9 >> 11.5  >> 12.5  Urine culture sent few days ago showed multiple species.  Repeat urine culture. Urinalysis was suggestive of  UTI.    She also has some hematuria as per UA, but her urine is grossly clear. Patient denies dysuria.   We will discontinue ceftriaxone .  Blood cultures sent few days ago did not show any growth.     Stage III sacral ulcer- POA: Postop day #4 surgery-I&D and debridement of the sacral decubitus today 04/28/2020 Tolerated procedure well -Remained stable  Another possible source of sepsis.  Has a chronic sacral ulcer with eschar .  Continue wound care, wound care nurse has been following.    As per the wound nurse evaluation, there was some foul smelling discharge. ... Status post debridement as above -Continue antibiotics -Unasyn  Sacral Wound (04/30/2020):     Fungal cystitis -Monitoring closely -P.o. Diflucan x3 days initiated 05/02/2020   Severe hypokalemia:   -Tolerating p.o., correcting orally Likely related to diarrhea - K  2.9>> 3.9 >> 4.5   Diarrhea:  -Improving Has been having diarrhea since last few days.    Negative GI pathogen panel.... Negative Abdomen right soft nontender nondistended  Imodium PRN  Severe systolic congestive heart failure:  -Continue gentle IV fluid hydration, try to avoid volume overload Was euvolemic or dehydrated.   Echocardiogram done on 03/31/2020 showed ejection fraction of less than 20%.  On Lasix at home which is on hold due to soft blood pressure and  Diarrhea. -Denies any shortness breath, or difficulty breathing  Coronary artery disease:  Cardiac cath done on 9/21 showed nonobstructive coronary artery disease. On aspirin Stable  History of  paroxysmal A. Fib: Stable  Currently in normal sinus rhythm.  On amiodarone.  On carvedilol for rate control, currently on hold.  Rate well controlled.  Hypertension -was mildly hypotensive on admission -BP stabilized,  -Still holding BP meds, gentle as needed IV  fluid  History of rheumatoid arthritis:  On prednisone, methotrexate, hydroxychloroquine.  Continue prednisone and hydroxychloroquine.  Adjustment disorder with depressed mood: Stable  Started on Celexa during this hospitalization.  Continue Remeron.  Psychiatry seen the patient and did not recommend inpatient psychiatric admission.  Hyperlipidemia: On pravastatin  Infantile cerebral palsy: Bedbound,  stable .  Has contractures on the lower extremities.  Continue supportive care.  Debility/deconditioning/placement issues:  Patient stating from home, repeating help Discussed with nursing staff social worker and patient she would like to return home with home health, possible med  She is on under Adult Protective Services.   We will continue to communicate with social worker for placement decision.   Nutrition Problem: Severe Malnutrition Etiology:  (cerebral palsy, CHF, chronic wounds)  Dietary consulted, dietary supplement initiated Body mass index is 18.51 kg/m.  Chronic anemia-anemia of chronic disease -Monitoring H&H closely -Mild drop in hemoglobin, no active site of bleeding       DVT prophylaxis:Lovenox Code Status: Full Family Communication: Friend present at the bedside Status is: Observation   Dispo: The patient is from: Home              Anticipated d/c is to: SNF   Appreciate social worker assisting, in contact with patient's DSS social worker, Raymond Gurney.              Anticipated d/c date is: Likely Monday, 05/03/2020              Patient currently is not medically stable for discharge Awaiting general surgery evaluation for the wound   Consultants: None  Procedures: None  Antimicrobials:  Anti-infectives (From admission, onward)   Start     Dose/Rate Route Frequency Ordered Stop   05/02/20 1000  fluconazole (DIFLUCAN) tablet 200 mg        200 mg Oral Daily 05/02/20 0902 05/05/20 0959   04/28/20 1000  cefTRIAXone (ROCEPHIN) 1 g in sodium  chloride 0.9 % 100 mL IVPB  Status:  Discontinued        1 g 200 mL/hr over 30 Minutes Intravenous Every 24 hours 04/27/20 1358 04/27/20 1406   04/28/20 0600  Ampicillin-Sulbactam (UNASYN) 3 g in sodium chloride 0.9 % 100 mL IVPB  Status:  Discontinued        3 g 200 mL/hr over 30 Minutes Intravenous On call to O.R. 04/27/20 1750 04/27/20 1754   04/28/20 0000  Ampicillin-Sulbactam (UNASYN) 3 g in sodium chloride 0.9 % 100 mL IVPB        3 g 200 mL/hr over 30 Minutes Intravenous Every 6 hours 04/27/20 1800     04/27/20 1445  amoxicillin-clavulanate (AUGMENTIN) 875-125 MG per tablet 1 tablet  Status:  Discontinued        1 tablet Oral Every 12 hours 04/27/20 1404 04/27/20 1754   04/27/20 1000  cefTRIAXone (ROCEPHIN) 1 g in sodium chloride 0.9 % 100 mL IVPB  Status:  Discontinued        1 g 200 mL/hr over 30 Minutes Intravenous Every 24 hours 04/26/20 1729 04/27/20 1358   04/26/20 1645  cefTRIAXone (ROCEPHIN) 1 g in sodium chloride 0.9 % 100 mL IVPB        1 g 200 mL/hr over  30 Minutes Intravenous  Once 04/26/20 1636 04/26/20 1734   04/22/20 1000  hydroxychloroquine (PLAQUENIL) tablet 400 mg        400 mg Oral Daily 04/22/20 0209     04/21/20 2345  cephALEXin (KEFLEX) capsule 250 mg  Status:  Discontinued        250 mg Oral Every 8 hours 04/21/20 2343 04/26/20 1729        Objective: Vitals:   05/01/20 1946 05/01/20 2345 05/02/20 0440 05/02/20 0753  BP: 115/71 113/75 106/68 110/61  Pulse: 86 91 82 84  Resp: 16 18 20 20   Temp: (!) 97.3 F (36.3 C) 98.1 F (36.7 C) 98.2 F (36.8 C) (!) 97.3 F (36.3 C)  TempSrc: Oral Oral Oral Oral  SpO2: 97% 97% 98% 99%  Weight:      Height:        Intake/Output Summary (Last 24 hours) at 05/02/2020 1151 Last data filed at 05/02/2020 0753 Gross per 24 hour  Intake 2407.47 ml  Output 400 ml  Net 2007.47 ml   Filed Weights   04/21/20 1238 04/28/20 1251  Weight: 43 kg 43 kg        Physical Exam:   General:  Alert, oriented,  cooperative, no distress;   HEENT:  Normocephalic, PERRL, otherwise with in Normal limits   Neuro:  CNII-XII intact. , normal motor and sensation, reflexes intact   Lungs:   Clear to auscultation BL, Respirations unlabored, no wheezes / crackles  Cardio:    S1/S2, RRR, No murmure, No Rubs or Gallops   Abdomen:   Soft, non-tender, bowel sounds active all four quadrants,  no guarding or peritoneal signs.  Muscular skeletal:  Limited exam -contracted hands, bilateral lower extremity 2+ pulses,  symmetric, No pitting edema  Skin:  Dry, warm to touch, negative for any Rashes, pressure wounds as listed below... Open sacral wound as described below  Wounds: Please see nursing documentation Pressure Injury 04/22/20 Buttocks Right Unstageable - Full thickness tissue loss in which the base of the injury is covered by slough (yellow, tan, gray, green or brown) and/or eschar (tan, brown or black) in the wound bed. Yellow sough noted to R side,  (Active)  04/22/20 1059  Location: Buttocks  Location Orientation: Right  Staging: Unstageable - Full thickness tissue loss in which the base of the injury is covered by slough (yellow, tan, gray, green or brown) and/or eschar (tan, brown or black) in the wound bed.  Wound Description (Comments): Yellow sough noted to R side, black eschar noted to L side, yellow/brown drainage noted from yellow slough.   Present on Admission: Yes     Pressure Injury 04/22/20 Hip Left Deep Tissue Pressure Injury - Purple or maroon localized area of discolored intact skin or blood-filled blister due to damage of underlying soft tissue from pressure and/or shear. (Active)  04/22/20 1102  Location: Hip  Location Orientation: Left  Staging: Deep Tissue Pressure Injury - Purple or maroon localized area of discolored intact skin or blood-filled blister due to damage of underlying soft tissue from pressure and/or shear.  Wound Description (Comments):   Present on Admission: Yes      Pressure Injury 04/22/20 N/A Right Stage 1 -  Intact skin with non-blanchable redness of a localized area usually over a bony prominence. (Active)  04/22/20 1103  Location: N/A (Rib cage)  Location Orientation: Right  Staging: Stage 1 -  Intact skin with non-blanchable redness of a localized area usually over a bony prominence.  Wound Description (Comments):   Present on Admission: Yes     Pressure Injury 04/22/20 N/A Left Stage 1 -  Intact skin with non-blanchable redness of a localized area usually over a bony prominence. (Active)  04/22/20 1104  Location: N/A (Rib cage)  Location Orientation: Left  Staging: Stage 1 -  Intact skin with non-blanchable redness of a localized area usually over a bony prominence.  Wound Description (Comments):   Present on Admission: Yes     Pressure Injury 04/22/20 Heel Right Stage 2 -  Partial thickness loss of dermis presenting as a shallow open injury with a red, pink wound bed without slough. (Active)  04/22/20 1106  Location: Heel  Location Orientation: Right  Staging: Stage 2 -  Partial thickness loss of dermis presenting as a shallow open injury with a red, pink wound bed without slough.  Wound Description (Comments):   Present on Admission: Yes     Pressure Injury 04/22/20 Heel Left Stage 2 -  Partial thickness loss of dermis presenting as a shallow open injury with a red, pink wound bed without slough. (Active)  04/22/20 1107  Location: Heel  Location Orientation: Left  Staging: Stage 2 -  Partial thickness loss of dermis presenting as a shallow open injury with a red, pink wound bed without slough.  Wound Description (Comments):   Present on Admission: Yes     Pressure Injury 04/22/20 Buttocks Left Stage 4 - Full thickness tissue loss with exposed bone, tendon or muscle. (Active)  04/22/20   Location: Buttocks  Location Orientation: Left  Staging: Stage 4 - Full thickness tissue loss with exposed bone, tendon or muscle.  Wound  Description (Comments):   Present on Admission: Yes           Sacral Wound (04/30/2020):          Data Reviewed: I have personally reviewed following labs and imaging studies  CBC: Recent Labs  Lab 04/26/20 1041 04/26/20 1041 04/27/20 0628 04/27/20 0628 04/28/20 0458 04/29/20 0313 04/30/20 0515 05/01/20 0317 05/02/20 0448  WBC 12.1*   < > 13.7*   < > 8.7 5.9 11.4* 11.5* 12.5*  NEUTROABS 10.1*  --  10.8*  --  6.2  --   --   --   --   HGB 9.6*   < > 8.5*   < > 8.3* 8.3* 8.0* 8.8* 8.2*  HCT 31.5*   < > 27.6*   < > 27.6* 28.6* 27.3* 30.8* 27.5*  MCV 91.3   < > 91.4   < > 92.3 95.0 94.8 98.1 96.2  PLT 285   < > 311   < > 293 304 423* 394 416*   < > = values in this interval not displayed.   Basic Metabolic Panel: Recent Labs  Lab 04/26/20 1041 04/27/20 0628 04/28/20 0458 04/29/20 0313 04/30/20 0515 05/01/20 0317 05/02/20 0448  NA 140   < > 142 145 146* 144 141  K 2.9*   < > 2.9* 3.9 3.8 4.6 4.5  CL 107   < > 115* 117* 118* 119* 115*  CO2 25   < > 20* 20* 20* 19* 20*  GLUCOSE 125*   < > 79 120* 119* 105* 125*  BUN 18   < > 10 9 15 14 14   CREATININE 0.78   < > 0.64 0.64 0.84 0.80 0.83  CALCIUM 8.3*   < > 7.5* 7.0* 7.3* 7.3* 7.5*  MG 1.9  --  1.7  --   --   --   --    < > =  values in this interval not displayed.   GFR: Estimated Creatinine Clearance: 41.6 mL/min (by C-G formula based on SCr of 0.83 mg/dL). Liver Function Tests: Sepsis Labs: Recent Labs  Lab 04/26/20 1531 04/26/20 2056  LATICACIDVEN 2.0* 1.1    Recent Results (from the past 240 hour(s))  Urine culture     Status: Abnormal   Collection Time: 04/26/20  2:58 PM   Specimen: Urine, Random  Result Value Ref Range Status   Specimen Description   Final    URINE, RANDOM Performed at [facility], [address], [place], [place] [address]    Special Requests   Final    NONE Performed at [facility], [address] [place], [place] [address]    Culture MULTIPLE  SPECIES PRESENT, SUGGEST RECOLLECTION (A)  Final   Report Status 04/27/2020 FINAL  Final  Gastrointestinal Panel by PCR , Stool     Status: None   Collection Time: 04/26/20  2:58 PM   Specimen: Stool  Result Value Ref Range Status   Campylobacter species NOT DETECTED NOT DETECTED Final   Plesimonas shigelloides NOT DETECTED NOT DETECTED Final   Salmonella species NOT DETECTED NOT DETECTED Final   Yersinia enterocolitica NOT DETECTED NOT DETECTED Final   Vibrio species NOT DETECTED NOT DETECTED Final   Vibrio cholerae NOT DETECTED NOT DETECTED Final   Enteroaggregative E coli (EAEC) NOT DETECTED NOT DETECTED Final   Enteropathogenic E coli (EPEC) NOT DETECTED NOT DETECTED Final   Enterotoxigenic E coli (ETEC) NOT DETECTED NOT DETECTED Final   Shiga like toxin producing E coli (STEC) NOT DETECTED NOT DETECTED Final   Shigella/Enteroinvasive E coli (EIEC) NOT DETECTED NOT DETECTED Final   Cryptosporidium NOT DETECTED NOT DETECTED Final   Cyclospora cayetanensis NOT DETECTED NOT DETECTED Final   Entamoeba histolytica NOT DETECTED NOT DETECTED Final   Giardia lamblia NOT DETECTED NOT DETECTED Final   Adenovirus F40/41 NOT DETECTED NOT DETECTED Final   Astrovirus NOT DETECTED NOT DETECTED Final   Norovirus GI/GII NOT DETECTED NOT DETECTED Final   Rotavirus A NOT DETECTED NOT DETECTED Final   Sapovirus (I, II, IV, and V) NOT DETECTED NOT DETECTED Final    Comment: Performed at [facility], [address] [place], [place] [address]  Respiratory Panel by RT PCR (Flu A&B, Covid) - Nasopharyngeal Swab     Status: None   Collection Time: 04/26/20  3:51 PM   Specimen: Nasopharyngeal Swab  Result Value Ref Range Status   SARS Coronavirus 2 by RT PCR NEGATIVE NEGATIVE Final    Comment: (NOTE) SARS-CoV-2 target nucleic acids are NOT DETECTED.  The SARS-CoV-2 RNA is generally detectable in upper respiratoy specimens during the acute phase of infection. The lowest concentration of  SARS-CoV-2 viral copies this assay can detect is 131 copies/mL. A negative result does not preclude SARS-Cov-2 infection and should not be used as the sole basis for treatment or other patient management decisions. A negative result may occur with  improper specimen collection/handling, submission of specimen other than nasopharyngeal swab, presence of viral mutation(s) within the areas targeted by this assay, and inadequate number of viral copies (<131 copies/mL). A negative result must be combined with clinical observations, patient history, and epidemiological information. The expected result is Negative.  Fact Sheet for Patients:  PinkCheek.be  Fact Sheet for Healthcare Providers:  GravelBags.it  This test is no t yet approved or cleared by the [place] FDA and  has been authorized for detection and/or diagnosis of  SARS-CoV-2 by FDA under an Emergency Use Authorization (EUA). This EUA will remain  in effect (meaning this test can be used) for the duration of the COVID-19 declaration under Section 564(b)(1) of the Act, 21 U.S.C. section 360bbb-3(b)(1), unless the authorization is terminated or revoked sooner.     Influenza A by PCR NEGATIVE NEGATIVE Final   Influenza B by PCR NEGATIVE NEGATIVE Final    Comment: (NOTE) The Xpert Xpress SARS-CoV-2/FLU/RSV assay is intended as an aid in  the diagnosis of influenza from Nasopharyngeal swab specimens and  should not be used as a sole basis for treatment. Nasal washings and  aspirates are unacceptable for Xpert Xpress SARS-CoV-2/FLU/RSV  testing.  Fact Sheet for Patients: PinkCheek.be  Fact Sheet for Healthcare Providers: GravelBags.it  This test is not yet approved or cleared by the [place] FDA and  has been authorized for detection and/or diagnosis of SARS-CoV-2 by  FDA under an Emergency Use  Authorization (EUA). This EUA will remain  in effect (meaning this test can be used) for the duration of the  Covid-19 declaration under Section 564(b)(1) of the Act, 21  U.S.C. section 360bbb-3(b)(1), unless the authorization is  terminated or revoked. Performed at [facility], [address] [place], [place] [address]   Aerobic/Anaerobic Culture (surgical/deep wound)     Status: None   Collection Time: 04/28/20  2:10 PM   Specimen: Wound; Tissue  Result Value Ref Range Status   Specimen Description   Final    WOUND Performed at [facility], [address], [place], [place] [address]    Special Requests   Final    TISSUE FROM SACRAL DECUBITIS WOUND Performed at [facility], [address] [place], [place] [address]    Gram Stain   Final    ABUNDANT WBC PRESENT, PREDOMINANTLY PMN FEW GRAM NEGATIVE RODS    Culture   Final    FEW PSEUDOMONAS AERUGINOSA FEW ENTEROCOCCUS FAECALIS FEW BACTEROIDES SPECIES BETA LACTAMASE POSITIVE Performed at [facility], [address], [place], [place] [address]    Report Status 05/02/2020 FINAL  Final   Organism ID, Bacteria PSEUDOMONAS AERUGINOSA  Final   Organism ID, Bacteria ENTEROCOCCUS FAECALIS  Final      Susceptibility   Enterococcus faecalis - MIC*    AMPICILLIN <=2 SENSITIVE Sensitive     VANCOMYCIN 2 SENSITIVE Sensitive     GENTAMICIN SYNERGY SENSITIVE Sensitive     * FEW ENTEROCOCCUS FAECALIS   Pseudomonas aeruginosa - MIC*    CEFTAZIDIME 4 SENSITIVE Sensitive     CIPROFLOXACIN <=0.25 SENSITIVE Sensitive     GENTAMICIN <=1 SENSITIVE Sensitive     IMIPENEM 2 SENSITIVE Sensitive     PIP/TAZO 16 SENSITIVE Sensitive     CEFEPIME 2 SENSITIVE Sensitive     * FEW PSEUDOMONAS AERUGINOSA  Urine Culture     Status: Abnormal   Collection Time: 04/29/20  4:30 AM   Specimen: Urine, Random  Result Value Ref Range Status   Specimen Description   Final    URINE, RANDOM Performed at [facility], [address] [place], [place] [address]    Special Requests   Final    NONE Performed at [facility], [address] [place],  [address]    Culture 40,000 COLONIES/mL YEAST (A)  Final   Report Status 04/30/2020 FINAL  Final         Radiology Studies: No results found.      Scheduled Meds: .  amiodarone  200 mg Oral Daily  . vitamin C  250 mg Oral BID  . aspirin EC  81 mg Oral Daily  . calcium-vitamin D  2 tablet Oral Q breakfast  . Chlorhexidine Gluconate Cloth  6 each Topical Daily  . cholecalciferol  1,000 Units Oral Daily  . citalopram  10 mg Oral Daily  . docusate sodium  100 mg Oral BID  . enoxaparin (LOVENOX) injection  30 mg Subcutaneous Q24H  . feeding supplement  237 mL Oral TID BM  . ferrous [id number] C-folic acid  1 capsule Oral BID PC  . fluconazole  200 mg Oral Daily  . hydroxychloroquine  400 mg Oral Daily  . latanoprost  1 drop Both Eyes QHS  . megestrol  400 mg Oral Daily  . mirtazapine  15 mg Oral QHS  . multivitamin-lutein  1 capsule Oral Daily  . oxyCODONE  10 mg Oral Q12H  . potassium chloride  20 mEq Oral Daily  . potassium chloride  40 mEq Oral Once  . pravastatin  40 mg Oral QHS  . predniSONE  2.5 mg Oral Q breakfast  . sodium chloride flush  10-40 mL Intracatheter Q12H   Continuous Infusions: . ampicillin-sulbactam (UNASYN) IV Stopped (05/02/20 0528)  . dextrose 75 mL/hr at 05/02/20 0951     LOS: 5 days    Time spent:25 mins. More than 50% of that time was spent in counseling and/or coordination of care.      Deatra James, MD [facility] P10/17/2021, 11:51 AM

## 2020-05-02 NOTE — TOC Progression Note (Signed)
Transition of Care [facility]) - Progression Note    Patient Details  Name: Tina Salinas MRN: [id number] Date of Birth: 08-29-1945  Transition of [facility] CM/SW Contact  Zigmund Daniel Dorian Pod, RN Phone Number: 05/02/2020, 11:08 AM  Clinical Narrative:    Unsuccessful in completing the readmission prevention with this pt today. Also unsuccessful outreach call to the primary caregiver [name]) in completing this task.  TOC team will continue to follow for discharge planning needs.   Expected Discharge Plan: Long Term Nursing Home Barriers to Discharge: ED Patient Insisting on an Alternate Living Situation/Facility  Expected Discharge Plan and Services Expected Discharge Plan: Long Term Nursing Home In-house Referral: Clinical Social Work   Post Acute Care Choice: Nursing Home Living arrangements for the past 2 months: Single Family Home                                       Social Determinants of Health (SDOH) Interventions    Readmission Risk Interventions No flowsheet data found.

## 2020-05-03 ENCOUNTER — Inpatient Hospital Stay: Payer: Medicare HMO

## 2020-05-03 DIAGNOSIS — R531 Weakness: Secondary | ICD-10-CM | POA: Diagnosis not present

## 2020-05-03 DIAGNOSIS — Z792 Long term (current) use of antibiotics: Secondary | ICD-10-CM | POA: Diagnosis not present

## 2020-05-03 DIAGNOSIS — Z452 Encounter for adjustment and management of vascular access device: Secondary | ICD-10-CM | POA: Diagnosis not present

## 2020-05-03 DIAGNOSIS — I82621 Acute embolism and thrombosis of deep veins of right upper extremity: Secondary | ICD-10-CM

## 2020-05-03 DIAGNOSIS — I5042 Chronic combined systolic (congestive) and diastolic (congestive) heart failure: Secondary | ICD-10-CM | POA: Diagnosis not present

## 2020-05-03 DIAGNOSIS — A419 Sepsis, unspecified organism: Secondary | ICD-10-CM | POA: Diagnosis not present

## 2020-05-03 DIAGNOSIS — N390 Urinary tract infection, site not specified: Secondary | ICD-10-CM | POA: Diagnosis not present

## 2020-05-03 DIAGNOSIS — Z95828 Presence of other vascular implants and grafts: Secondary | ICD-10-CM

## 2020-05-03 DIAGNOSIS — R52 Pain, unspecified: Secondary | ICD-10-CM

## 2020-05-03 LAB — BASIC METABOLIC PANEL
Anion gap: 5 (ref 5–15)
BUN: 17 mg/dL (ref 8–23)
CO2: 24 mmol/L (ref 22–32)
Calcium: 7.7 mg/dL — ABNORMAL LOW (ref 8.9–10.3)
Chloride: 112 mmol/L — ABNORMAL HIGH (ref 98–111)
Creatinine, Ser: 0.89 mg/dL (ref 0.44–1.00)
GFR, Estimated: >60 mL/min (ref >60)
Glucose, Bld: 143 mg/dL — ABNORMAL HIGH (ref 70–99)
Potassium: 4.6 mmol/L (ref 3.5–5.1)
Sodium: 141 mmol/L (ref 135–145)

## 2020-05-03 LAB — CBC
HCT: 28.1 % — ABNORMAL LOW (ref 36.0–46.0)
Hemoglobin: 8.2 g/dL — ABNORMAL LOW (ref 12.0–15.0)
MCH: 28.3 pg (ref 26.0–34.0)
MCHC: 29.2 g/dL — ABNORMAL LOW (ref 30.0–36.0)
MCV: 96.9 fL (ref 80.0–100.0)
Platelets: 461 10*3/uL — ABNORMAL HIGH (ref 150–400)
RBC: 2.9 MIL/uL — ABNORMAL LOW (ref 3.87–5.11)
RDW: 22.5 % — ABNORMAL HIGH (ref 11.5–15.5)
WBC: 11.6 10*3/uL — ABNORMAL HIGH (ref 4.0–10.5)
nRBC: 3.7 % — ABNORMAL HIGH (ref 0.0–0.2)

## 2020-05-03 MED ORDER — HYDROMORPHONE HCL 1 MG/ML IJ SOLN
0.5000 mg | INTRAMUSCULAR | Status: DC | PRN
  Administered 2020-05-04 – 2020-05-07 (×4): 0.5 mg via INTRAVENOUS
  Filled 2020-05-03 (×5): qty 0.5

## 2020-05-03 MED ORDER — HYDROMORPHONE HCL 1 MG/ML IJ SOLN
0.5000 mg | INTRAMUSCULAR | Status: AC
  Administered 2020-05-03: 0.5 mg via INTRAVENOUS
  Filled 2020-05-03: qty 0.5

## 2020-05-03 MED ORDER — FUROSEMIDE 10 MG/ML IJ SOLN
20.0000 mg | Freq: Once | INTRAMUSCULAR | Status: AC
  Administered 2020-05-03: 20 mg via INTRAVENOUS
  Filled 2020-05-03: qty 4

## 2020-05-03 NOTE — Progress Notes (Signed)
[facility] Day(s): 6.   Post op day(s): 5 Days Post-Op.   Interval History: Patient seen and examined no acute events or new complaints overnight.  Leukocytosis improved, down to 11.6K Renal function normal, sCr - 0.89, UO - 200 ccs + unmeasured  Wound Cx from OR growing pseudomonas aeruginosa, enterococcus faecalis, beta-lactamase positive, susceptibilities reviewed Wound vac to sacrum, output serosanguinous, output not recorded in last 24 hours  Vital signs in last 24 hours: [min-max] current  Temp:  [97.4 F (36.3 C)-98.3 F (36.8 C)] 97.6 F (36.4 C) (10/18 0736) Pulse Rate:  [81-89] 86 (10/18 0736) Resp:  [19-20] 20 (10/18 0736) BP: (109-119)/(69-79) 117/79 (10/18 0736) SpO2:  [95 %-99 %] 99 % (10/18 0736)     Height: 5' (152.4 cm) Weight: 43 kg BMI (Calculated): 18.51   Intake/Output last 2 shifts:  10/17 0701 - 10/18 0700 In: 754.2 [P.O.:310; I.V.:150.2; IV Piggyback:294] Out: 200 [Urine:200]   Physical Exam:  Constitutional: alert, cooperative and no distress  Respiratory: breathing non-labored at rest  Cardiovascular: regular rate and sinus rhythm Musculoskeletal: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 Please refer to [facility] RN note regarding wound vac change    Labs:  CBC Latest Ref Rng & Units 05/03/2020 05/02/2020 05/01/2020  WBC 4.0 - 10.5 K/uL 11.6(H) 12.5(H) 11.5(H)  Hemoglobin 12.0 - 15.0 g/dL 8.2(L) 8.2(L) 8.8(L)  Hematocrit 36 - 46 % 28.1(L) 27.5(L) 30.8(L)  Platelets 150 - 400 K/uL 461(H) 416(H) 394   CMP Latest Ref Rng & Units 05/03/2020 05/02/2020 05/01/2020  Glucose 70 - 99 mg/dL 143(H) 125(H) 105(H)  BUN 8 - 23 mg/dL 17 14 14   Creatinine 0.44 - 1.00 mg/dL 0.89 0.83 0.80  Sodium 135 - 145 mmol/L 141 141 144  Potassium 3.5 - 5.1 mmol/L 4.6 4.5 4.6  Chloride 98 - 111 mmol/L 112(H) 115(H) 119(H)  CO2 22 - 32 mmol/L 24  20(L) 19(L)  Calcium 8.9 - 10.3 mg/dL 7.7(L) 7.5(L) 7.3(L)  Total Protein 6.5 - 8.1 g/dL - - -  Total Bilirubin 0.3 - 1.2 mg/dL - - -  Alkaline Phos 38 - 126 U/L - - -  AST 15 - 41 U/L - - -  ALT 0 - 44 U/L - - -     Imaging studies: No new pertinent imaging studies   Assessment/Plan: 74 y.o. female 5 Days Post-Op s/p excisional debridement of sacral decubitus wound, including skin, subcutaneous tissue, and muscle, for total area of 15 x 10 cmand application of wound vac, complicated by pertinent comorbidities includingcerebral palsy   - Appreciate WOC RN assistance with wound vac change today; will plan on bedside changes MWF - Continue Abx; follow up Cx from OR; growing multiple species; susceptibilities reviewed. Transition to PO at discharge recommend she complete 14 days total (IV + PO) - Local wound care: Continue local dressings, frequent repositioning, low air loss mattress - Pain control prn - Further management per primary service; we will sign off; patient can follow with wound care center at discharge  All of the above findings and recommendations were discussed with the patient, and the medical team, and all of patient's questions were answered to her expressed satisfaction.  -- Edison Simon, PA-C [facility] 05/03/2020, 8:14 AM [phone] M-F: 7am - 4pm

## 2020-05-03 NOTE — Care Management Important Message (Signed)
Important Message  Patient Details  Name: Tina Salinas MRN: [id number] Date of Birth: Mar 08, 1946   Medicare Important Message Given:  Yes     [name] 05/03/2020, 1:05 PM

## 2020-05-03 NOTE — Progress Notes (Addendum)
Chest x-ray of PICC tip placement show PICC tip in appropriate location at the cavoatrial junction. Right UPPER EXTREMITY VENOUS DOPPLER ULTRASOUND revealed deep venous thrombosis visualized within the right subclavian, axillary, and basilic veins. Spoke with Dr. Roger Shelter regarding leaving line in place, while it functions properly and while patient needs IV access for medications/fluids. Per Dr. Roger Shelter, patient is not appropriate candidate for anticoagulation at this time d/t large decubitus ulcer. Advised pt's nurse of plan to leave PICC in place while patient needs treatment and line functions properly. Also educated that pt's right arm should be elevated on pillows above level of heart to assist with swelling.  Further educated pt's nurse to pass along during handoff if pt's arm becomes more swollen, feels different to touch from left arm, or pulses become weaker to notify physician immediately as this is a sign of decreased circulation and needs to be treated as an emergency.

## 2020-05-03 NOTE — Progress Notes (Signed)
PROGRESS NOTE    SHANIKQUA ZARZYCKI  HMC:[id number] DOB: Jun 17, 1946 DOA: 04/21/2020 PCP: Birdie Sons, MD   Chief Complain: Weakness, placement issue   Subjective: The patient was seen and examined this morning, nursing staff present at bedside she seems very uncomfortable, in pain does not like to be moved around due to her back wounds and pain/discomfort Nursing staff reporting poor p.o. intake this morning Patient requesting for more pain medication Also complaining of right arm swelling. -PICC line in place  Confirmed DNR/DNI status, agreed with social worker to be placed to SNF... Requested [place] facility  Postop day #5 Status post debridement of sacral decubitus stage III wound Stable    Brief Narrative:  Patient is a 74 y.o. female with medical history significant of cerebral palsy, congestive heart failure, hypertension, rheumatoid arthritis, hyperlipidemia, proximal A. fib who was brought from home with complaints of generalized weakness, failure to thrive and psychiatric evaluation.  She is bedbound at baseline and is under Adult Scientist, forensic.  She is cared by home health aides but recently started firing them and denied their service.  Patient was in the emergency department for last few days waiting for placement in a skilled nursing facility.  During her stay in ED, ED physicians suspected that she developed sepsis secondary to UTI and requested hospital admission.  Currently she is waiting for placement.SW following.  Assessment & Plan:   Active Problems:   Adjustment disorder with depressed mood   Infantile cerebral palsy (HCC)   Chronic combined systolic and diastolic CHF (congestive heart failure) (HCC)   Malnutrition of moderate degree   Hypokalemia   Pressure injury of skin   Sepsis ([place])   Suspected Sepsis/sacral decubitus / urinary tract infection:  -Sepsis physiology resolved, stable  Possible large sacral decubitus contributing -status post  debridement postop day 2 -We will continue gentle IV fluid hydration, will be switched to D5 W due to elevated sodium -We will continue IV antibiotics  -Unasyn  -Lactic acid 2.0 >> 1.1 -WBC 13.7 >> 8.7 >> 5.9 >> 11.5 >> 12.5  Urine culture sent few days ago showed multiple species.  Repeat urine culture. Urinalysis was suggestive of  UTI.    She also has some hematuria as per UA, but her urine is grossly clear. Patient denies dysuria.  We will discontinue ceftriaxone .  Blood cultures sent few days ago did not show any growth.     Stage III sacral ulcer- POA: Postop day #4 surgery-I&D and debridement of the sacral decubitus today 04/28/2020 -Wound cultures grew Pseudomonas -Antibiotics switched to IV Zosyn  Another possible source of sepsis.  Has a chronic sacral ulcer with eschar .  Continue wound care, wound care nurse has been following.    As per the wound nurse evaluation, there was some foul smelling discharge. ... Status post debridement as above -Continue antibiotics -Unasyn >> switch to Zosyn  Sacral Wound (04/30/2020):     Right arm edema -Right arm PICC line in place -We will obtain ultrasound to rule out DVT, and x-ray to evaluate position of PICC line -Anticipate discontinuing the line, discontinuing IV fluids  fungal cystitis -Monitoring closely -P.o. Diflucan x3 days initiated 05/02/2020 -Stable   Severe hypokalemia:   -Tolerating p.o., correcting orally Likely related to diarrhea - K  2.9>> 3.9 >> 4.5   Diarrhea:  -Per nursing staff improved Has been having diarrhea since last few days.    Negative GI pathogen panel.... Negative Abdomen right soft nontender nondistended  Imodium PRN  Severe systolic congestive heart failure:  -Discontinued IV fluids, -Due to increased crackles mild wheezing, p.o. Lasix, 20 mg IV x1 today -Echocardiogram done on 03/31/2020 showed ejection fraction of less than 20%.   On Lasix at home which is on hold due to soft  blood pressure and  Diarrhea. -Denies any shortness breath, or difficulty breathing  Coronary artery disease:  Cardiac cath done on 9/21 showed nonobstructive coronary artery disease. On aspirin Remained stable  History of paroxysmal A. Fib: Remained stable  Currently in normal sinus rhythm.  On amiodarone.  On carvedilol for rate control, currently on hold.  Rate well controlled.  Hypertension -was mildly hypotensive on admission -Blood pressure stabilized -Discontinue IV fluids  History of rheumatoid arthritis:  On prednisone, methotrexate, hydroxychloroquine.  Continue prednisone and hydroxychloroquine.  Adjustment disorder with depressed mood: -Remained stable  Started on Celexa during this hospitalization.  Continue Remeron.  Psychiatry seen the patient and did not recommend inpatient psychiatric admission.  Hyperlipidemia: On pravastatin  Infantile cerebral palsy: Bedbound,  stable .  Has contractures on the lower extremities.  Continue supportive care.  Debility/deconditioning/placement issues:  Patient stating from home, repeating help Discussed with nursing staff social worker and patient she would like to return home with home health, possible med  She is on under Adult Protective Services.   We will continue to communicate with social worker for placement decision.   Nutrition Problem: Severe Malnutrition Etiology:  (cerebral palsy, CHF, chronic wounds)  Dietary consulted, dietary supplement initiated Body mass index is 18.51 kg/m.  Chronic anemia-anemia of chronic disease -Monitoring H&H -Mild drop in hemoglobin, no active site of bleeding   Ethics: Palliative care following patient is agreed to DNR/DNI status Due to current infection, multiple comorbidities, prognosis remain poor.     DVT prophylaxis:Lovenox Code Status: Full Family Communication: Friend present at the bedside Status is: Observation   Dispo: The patient is from: Home               Anticipated d/c is to: SNF   Appreciate social worker assisting, in contact with patient's DSS social worker, Raymond Gurney.              Anticipated d/c date is: Likely Monday, 05/03/2020              Patient currently is not medically stable for discharge Awaiting general surgery evaluation for the wound   Consultants: None  Procedures: None  Antimicrobials:  Anti-infectives (From admission, onward)   Start     Dose/Rate Route Frequency Ordered Stop   05/02/20 1630  piperacillin-tazobactam (ZOSYN) IVPB 3.375 g        3.375 g 12.5 mL/hr over 240 Minutes Intravenous Every 8 hours 05/02/20 1539     05/02/20 1000  fluconazole (DIFLUCAN) tablet 200 mg        200 mg Oral Daily 05/02/20 0902 05/05/20 0959   04/28/20 1000  cefTRIAXone (ROCEPHIN) 1 g in sodium chloride 0.9 % 100 mL IVPB  Status:  Discontinued        1 g 200 mL/hr over 30 Minutes Intravenous Every 24 hours 04/27/20 1358 04/27/20 1406   04/28/20 0600  Ampicillin-Sulbactam (UNASYN) 3 g in sodium chloride 0.9 % 100 mL IVPB  Status:  Discontinued        3 g 200 mL/hr over 30 Minutes Intravenous On call to O.R. 04/27/20 1750 04/27/20 1754   04/28/20 0000  Ampicillin-Sulbactam (UNASYN) 3 g in sodium chloride 0.9 % 100 mL  IVPB  Status:  Discontinued        3 g 200 mL/hr over 30 Minutes Intravenous Every 6 hours 04/27/20 1800 05/02/20 1539   04/27/20 1445  amoxicillin-clavulanate (AUGMENTIN) 875-125 MG per tablet 1 tablet  Status:  Discontinued        1 tablet Oral Every 12 hours 04/27/20 1404 04/27/20 1754   04/27/20 1000  cefTRIAXone (ROCEPHIN) 1 g in sodium chloride 0.9 % 100 mL IVPB  Status:  Discontinued        1 g 200 mL/hr over 30 Minutes Intravenous Every 24 hours 04/26/20 1729 04/27/20 1358   04/26/20 1645  cefTRIAXone (ROCEPHIN) 1 g in sodium chloride 0.9 % 100 mL IVPB        1 g 200 mL/hr over 30 Minutes Intravenous  Once 04/26/20 1636 04/26/20 1734   04/22/20 1000  hydroxychloroquine (PLAQUENIL) tablet 400 mg         400 mg Oral Daily 04/22/20 0209     04/21/20 2345  cephALEXin (KEFLEX) capsule 250 mg  Status:  Discontinued        250 mg Oral Every 8 hours 04/21/20 2343 04/26/20 1729        Objective: Vitals:   05/02/20 2309 05/03/20 0417 05/03/20 0736 05/03/20 1148  BP: 119/73 109/73 117/79 120/84  Pulse: 89 81 86 (!) 102  Resp: 20 20 20 20   Temp: (!) 97.4 F (36.3 C) 98.3 F (36.8 C) 97.6 F (36.4 C) 98.2 F (36.8 C)  TempSrc: Oral Oral Oral Oral  SpO2: 98% 99% 99% 99%  Weight:      Height:        Intake/Output Summary (Last 24 hours) at 05/03/2020 1255 Last data filed at 05/03/2020 0400 Gross per 24 hour  Intake 510 ml  Output 200 ml  Net 310 ml   Filed Weights   04/21/20 1238 04/28/20 1251  Weight: 43 kg 43 kg       Physical Exam:   General:  Alert, oriented, cooperative, no distress;   HEENT:  Normocephalic, PERRL, otherwise with in Normal limits   Neuro:  CNII-XII intact. , normal motor and sensation, reflexes intact   Lungs:   Clear to auscultation BL, Respirations unlabored, no wheezes / crackles  Cardio:    S1/S2, RRR, No murmure, No Rubs or Gallops   Abdomen:   Soft, non-tender, bowel sounds active all four quadrants,  no guarding or peritoneal signs.  Muscular skeletal:  Limited exam - in bed, able to move all 4 extremities, Normal strength,  2+ pulses,  symmetric, No pitting edema  Skin:  Dry, warm to touch, negative for any Rashes, pressure wounds as listed below  Wounds: Please see nursing documentation Pressure Injury 04/22/20 Buttocks Right Unstageable - Full thickness tissue loss in which the base of the injury is covered by slough (yellow, tan, gray, green or brown) and/or eschar (tan, brown or black) in the wound bed. Yellow sough noted to R side,  (Active)  04/22/20 1059  Location: Buttocks  Location Orientation: Right  Staging: Unstageable - Full thickness tissue loss in which the base of the injury is covered by slough (yellow, tan, gray, green or  brown) and/or eschar (tan, brown or black) in the wound bed.  Wound Description (Comments): Yellow sough noted to R side, black eschar noted to L side, yellow/brown drainage noted from yellow slough.   Present on Admission: Yes     Pressure Injury 04/22/20 Hip Left Deep Tissue Pressure Injury - Purple or  maroon localized area of discolored intact skin or blood-filled blister due to damage of underlying soft tissue from pressure and/or shear. (Active)  04/22/20 1102  Location: Hip  Location Orientation: Left  Staging: Deep Tissue Pressure Injury - Purple or maroon localized area of discolored intact skin or blood-filled blister due to damage of underlying soft tissue from pressure and/or shear.  Wound Description (Comments):   Present on Admission: Yes     Pressure Injury 04/22/20 N/A Right Stage 1 -  Intact skin with non-blanchable redness of a localized area usually over a bony prominence. (Active)  04/22/20 1103  Location: N/A (Rib cage)  Location Orientation: Right  Staging: Stage 1 -  Intact skin with non-blanchable redness of a localized area usually over a bony prominence.  Wound Description (Comments):   Present on Admission: Yes     Pressure Injury 04/22/20 N/A Left Stage 1 -  Intact skin with non-blanchable redness of a localized area usually over a bony prominence. (Active)  04/22/20 1104  Location: N/A (Rib cage)  Location Orientation: Left  Staging: Stage 1 -  Intact skin with non-blanchable redness of a localized area usually over a bony prominence.  Wound Description (Comments):   Present on Admission: Yes     Pressure Injury 04/22/20 Heel Right Stage 2 -  Partial thickness loss of dermis presenting as a shallow open injury with a red, pink wound bed without slough. (Active)  04/22/20 1106  Location: Heel  Location Orientation: Right  Staging: Stage 2 -  Partial thickness loss of dermis presenting as a shallow open injury with a red, pink wound bed without slough.   Wound Description (Comments):   Present on Admission: Yes     Pressure Injury 04/22/20 Heel Left Stage 2 -  Partial thickness loss of dermis presenting as a shallow open injury with a red, pink wound bed without slough. (Active)  04/22/20 1107  Location: Heel  Location Orientation: Left  Staging: Stage 2 -  Partial thickness loss of dermis presenting as a shallow open injury with a red, pink wound bed without slough.  Wound Description (Comments):   Present on Admission: Yes     Pressure Injury 04/22/20 Buttocks Left Stage 4 - Full thickness tissue loss with exposed bone, tendon or muscle. (Active)  04/22/20   Location: Buttocks  Location Orientation: Left  Staging: Stage 4 - Full thickness tissue loss with exposed bone, tendon or muscle.  Wound Description (Comments):   Present on Admission: Yes             Sacral Wound (04/30/2020):          Data Reviewed: I have personally reviewed following labs and imaging studies  CBC: Recent Labs  Lab 04/27/20 0628 04/27/20 0628 04/28/20 0458 04/28/20 0458 04/29/20 0313 04/30/20 0515 05/01/20 0317 05/02/20 0448 05/03/20 0327  WBC 13.7*   < > 8.7   < > 5.9 11.4* 11.5* 12.5* 11.6*  NEUTROABS 10.8*  --  6.2  --   --   --   --   --   --   HGB 8.5*   < > 8.3*   < > 8.3* 8.0* 8.8* 8.2* 8.2*  HCT 27.6*   < > 27.6*   < > 28.6* 27.3* 30.8* 27.5* 28.1*  MCV 91.4   < > 92.3   < > 95.0 94.8 98.1 96.2 96.9  PLT 311   < > 293   < > 304 423* 394 416* 461*   < > =  values in this interval not displayed.   Basic Metabolic Panel: Recent Labs  Lab 04/28/20 0458 04/28/20 0458 04/29/20 0313 04/30/20 0515 05/01/20 0317 05/02/20 0448 05/03/20 0327  NA 142   < > 145 146* 144 141 141  K 2.9*   < > 3.9 3.8 4.6 4.5 4.6  CL 115*   < > 117* 118* 119* 115* 112*  CO2 20*   < > 20* 20* 19* 20* 24  GLUCOSE 79   < > 120* 119* 105* 125* 143*  BUN 10   < > 9 15 14 14 17   CREATININE 0.64   < > 0.64 0.84 0.80 0.83 0.89  CALCIUM 7.5*   < >  7.0* 7.3* 7.3* 7.5* 7.7*  MG 1.7  --   --   --   --   --   --    < > = values in this interval not displayed.   GFR: Estimated Creatinine Clearance: 38.8 mL/min (by C-G formula based on SCr of 0.89 mg/dL). Liver Function Tests: Sepsis Labs: Recent Labs  Lab 04/26/20 1531 04/26/20 2056  LATICACIDVEN 2.0* 1.1    Recent Results (from the past 240 hour(s))  Urine culture     Status: Abnormal   Collection Time: 04/26/20  2:58 PM   Specimen: Urine, Random  Result Value Ref Range Status   Specimen Description   Final    URINE, RANDOM Performed at [facility], [address], [place], [place] [address]    Special Requests   Final    NONE Performed at [facility], [address] [place], [place] [address]    Culture MULTIPLE SPECIES PRESENT, SUGGEST RECOLLECTION (A)  Final   Report Status 04/27/2020 FINAL  Final  Gastrointestinal Panel by PCR , Stool     Status: None   Collection Time: 04/26/20  2:58 PM   Specimen: Stool  Result Value Ref Range Status   Campylobacter species NOT DETECTED NOT DETECTED Final   Plesimonas shigelloides NOT DETECTED NOT DETECTED Final   Salmonella species NOT DETECTED NOT DETECTED Final   Yersinia enterocolitica NOT DETECTED NOT DETECTED Final   Vibrio species NOT DETECTED NOT DETECTED Final   Vibrio cholerae NOT DETECTED NOT DETECTED Final   Enteroaggregative E coli (EAEC) NOT DETECTED NOT DETECTED Final   Enteropathogenic E coli (EPEC) NOT DETECTED NOT DETECTED Final   Enterotoxigenic E coli (ETEC) NOT DETECTED NOT DETECTED Final   Shiga like toxin producing E coli (STEC) NOT DETECTED NOT DETECTED Final   Shigella/Enteroinvasive E coli (EIEC) NOT DETECTED NOT DETECTED Final   Cryptosporidium NOT DETECTED NOT DETECTED Final   Cyclospora cayetanensis NOT DETECTED NOT DETECTED Final   Entamoeba histolytica NOT DETECTED NOT DETECTED Final   Giardia lamblia NOT DETECTED NOT DETECTED Final   Adenovirus F40/41 NOT DETECTED NOT  DETECTED Final   Astrovirus NOT DETECTED NOT DETECTED Final   Norovirus GI/GII NOT DETECTED NOT DETECTED Final   Rotavirus A NOT DETECTED NOT DETECTED Final   Sapovirus (I, II, IV, and V) NOT DETECTED NOT DETECTED Final    Comment: Performed at [facility], [address] [place], [place] [address]  Respiratory Panel by RT PCR (Flu A&B, Covid) - Nasopharyngeal Swab     Status: None   Collection Time: 04/26/20  3:51 PM   Specimen: Nasopharyngeal Swab  Result Value Ref Range Status   SARS Coronavirus 2 by RT PCR NEGATIVE NEGATIVE Final    Comment: (NOTE) SARS-CoV-2 target nucleic acids are NOT  DETECTED.  The SARS-CoV-2 RNA is generally detectable in upper respiratoy specimens during the acute phase of infection. The lowest concentration of SARS-CoV-2 viral copies this assay can detect is 131 copies/mL. A negative result does not preclude SARS-Cov-2 infection and should not be used as the sole basis for treatment or other patient management decisions. A negative result may occur with  improper specimen collection/handling, submission of specimen other than nasopharyngeal swab, presence of viral mutation(s) within the areas targeted by this assay, and inadequate number of viral copies (<131 copies/mL). A negative result must be combined with clinical observations, patient history, and epidemiological information. The expected result is Negative.  Fact Sheet for Patients:  PinkCheek.be  Fact Sheet for Healthcare Providers:  GravelBags.it  This test is no t yet approved or cleared by the [place] FDA and  has been authorized for detection and/or diagnosis of SARS-CoV-2 by FDA under an Emergency Use Authorization (EUA). This EUA will remain  in effect (meaning this test can be used) for the duration of the COVID-19 declaration under Section 564(b)(1) of the Act, 21 U.S.C. section 360bbb-3(b)(1), unless the  authorization is terminated or revoked sooner.     Influenza A by PCR NEGATIVE NEGATIVE Final   Influenza B by PCR NEGATIVE NEGATIVE Final    Comment: (NOTE) The Xpert Xpress SARS-CoV-2/FLU/RSV assay is intended as an aid in  the diagnosis of influenza from Nasopharyngeal swab specimens and  should not be used as a sole basis for treatment. Nasal washings and  aspirates are unacceptable for Xpert Xpress SARS-CoV-2/FLU/RSV  testing.  Fact Sheet for Patients: PinkCheek.be  Fact Sheet for Healthcare Providers: GravelBags.it  This test is not yet approved or cleared by the [place] FDA and  has been authorized for detection and/or diagnosis of SARS-CoV-2 by  FDA under an Emergency Use Authorization (EUA). This EUA will remain  in effect (meaning this test can be used) for the duration of the  Covid-19 declaration under Section 564(b)(1) of the Act, 21  U.S.C. section 360bbb-3(b)(1), unless the authorization is  terminated or revoked. Performed at [facility], [address] [place], [place] [address]   Aerobic/Anaerobic Culture (surgical/deep wound)     Status: None   Collection Time: 04/28/20  2:10 PM   Specimen: Wound; Tissue  Result Value Ref Range Status   Specimen Description   Final    WOUND Performed at [facility], [address] [place], [place] [address]    Special Requests   Final    TISSUE FROM SACRAL DECUBITIS WOUND Performed at [facility], [address] [place], [place] [address]    Gram Stain   Final    ABUNDANT WBC PRESENT, PREDOMINANTLY PMN FEW GRAM NEGATIVE RODS    Culture   Final    FEW PSEUDOMONAS AERUGINOSA FEW ENTEROCOCCUS FAECALIS FEW BACTEROIDES SPECIES BETA LACTAMASE POSITIVE Performed at [facility], [place] [address], [place], [place] [address]    Report Status 05/02/2020 FINAL  Final   Organism ID, Bacteria PSEUDOMONAS AERUGINOSA  Final    Organism ID, Bacteria ENTEROCOCCUS FAECALIS  Final      Susceptibility   Enterococcus faecalis - MIC*    AMPICILLIN <=2 SENSITIVE Sensitive     VANCOMYCIN 2 SENSITIVE Sensitive     GENTAMICIN SYNERGY SENSITIVE Sensitive     * FEW ENTEROCOCCUS FAECALIS   Pseudomonas aeruginosa - MIC*    CEFTAZIDIME 4 SENSITIVE Sensitive     CIPROFLOXACIN <=0.25 SENSITIVE Sensitive  GENTAMICIN <=1 SENSITIVE Sensitive     IMIPENEM 2 SENSITIVE Sensitive     PIP/TAZO 16 SENSITIVE Sensitive     CEFEPIME 2 SENSITIVE Sensitive     * FEW PSEUDOMONAS AERUGINOSA  Urine Culture     Status: Abnormal   Collection Time: 04/29/20  4:30 AM   Specimen: Urine, Random  Result Value Ref Range Status   Specimen Description   Final    URINE, RANDOM Performed at [facility], [address], [place], [place] [address]    Special Requests   Final    NONE Performed at [facility], [address] [place], [place] [address]    Culture 40,000 COLONIES/mL YEAST (A)  Final   Report Status 04/30/2020 FINAL  Final         Radiology Studies: No results found.      Scheduled Meds:  amiodarone  200 mg Oral Daily   vitamin C  250 mg Oral BID   aspirin EC  81 mg Oral Daily   calcium-vitamin D  2 tablet Oral Q breakfast   Chlorhexidine Gluconate Cloth  6 each Topical Daily   cholecalciferol  1,000 Units Oral Daily   citalopram  10 mg Oral Daily   docusate sodium  100 mg Oral BID   enoxaparin (LOVENOX) injection  30 mg Subcutaneous Q24H   feeding supplement  237 mL Oral TID BM   ferrous [id number] C-folic acid  1 capsule Oral BID PC   fluconazole  200 mg Oral Daily   furosemide  20 mg Intravenous Once   hydroxychloroquine  400 mg Oral Daily   ipratropium  0.5 mg Nebulization Q8H   latanoprost  1 drop Both Eyes QHS   levalbuterol  0.63 mg Nebulization Q8H   megestrol  400 mg Oral Daily   mirtazapine  15 mg Oral QHS   multivitamin-lutein  1 capsule Oral  Daily   oxyCODONE  10 mg Oral Q12H   potassium chloride  20 mEq Oral Daily   potassium chloride  40 mEq Oral Once   pravastatin  40 mg Oral QHS   predniSONE  2.5 mg Oral Q breakfast   sodium chloride flush  10-40 mL Intracatheter Q12H   Continuous Infusions:  piperacillin-tazobactam (ZOSYN)  IV 3.375 g (05/03/20 0526)     LOS: 6 days    Time spent:25 mins. More than 50% of that time was spent in counseling and/or coordination of care.      Deatra James, MD [facility] P10/18/2021, 12:55 PM

## 2020-05-03 NOTE — Plan of Care (Signed)
Patient has been refusing meals. Very poor appetite. She also refused to be turned and repositioned. Does not want staff to look at her legs and do nursing care. Educated patient on importance of repositioning but patient insisted not to be turned.  C/o pain constantly. She yells once you turned her. Pain med has been ordered by palliative team. Wound vac was changed today by wound RN.

## 2020-05-03 NOTE — Progress Notes (Signed)
Occupational Therapy Treatment Patient Details Name: Tina Salinas MRN: [id number] DOB: [birth year] Today's Date: 05/03/2020    History of present illness Pt is 74 y/o F with PMH: cerebral palsy and combined hert failure. Pt presented to ER secondary to progressive SOB, chest pain; admitted for management of NSTEMI. Sacral wound addressed in OR, wound vac placed.   OT comments  Pt seen for OT tx this date to f/u re: safety with ADLs progress with ADL mobility. Pt resting in bed when OT presents, but is again in R side lying and with neck in R lateral flexion. Pt refuses all attempts by OT to gently reposition and facilitate L lying or use pillows to decrease skin to skin contact. OT attempts to engage pt in the below-listed UE therex at bed level as pt declines attempting to sit EOB this date. Pt with poor tolerance and OT grades task down by reducing repetitions. Pt engaged in self-feeding. Her R UE is noted to be swollen and RN/MD notified. Potentially d/t primarily R side-lying as pt declines attempts to turn to L as ordered for protection of skin/prevention of breakdown (education completed with pt, but pt with limited reception of skin integrity education d/t preferring R side-lying). Pt is R handed, but ultimately primarily uses L UE for self-feeding task with OT as it is easier to utilize d/t swollen nature of R UE. Pt requires MAX A for hand over hand to perform hadn to mouth x3 with scoops of magic cup ice cream. Moderate encouragement utilized as well as education re: healing from surgery and general health/progess in relation to oral intake of protein. Pt again with poor reception of education provided, but does become agreeable to at lease attempting to eat this one item from food tray. RN present at end of session to administer medications including pain medication and CNA notified pt will require assist to eat for all meals. MD notified of pt performance via secure chat. Will continue to  follow. Continue to anticipate that SNF is most appropriate d/c recommendation at this time.    Follow Up Recommendations  SNF    Equipment Recommendations  Other (comment) (defer to next level of care, anticpate lift and hospital bed will be required)    Recommendations for Other Services      Precautions / Restrictions Precautions Precautions: Fall Precaution Comments: Stage III sacral wound, wound vac Restrictions Weight Bearing Restrictions: No       Mobility Bed Mobility               General bed mobility comments: pt declines on OT assessment, citing pain  Transfers                 General transfer comment: unsafe/unable at this time    Balance       Sitting balance - Comments: deferred       Standing balance comment: deferred                           ADL either performed or assessed with clinical judgement   ADL Overall ADL's : Needs assistance/impaired Eating/Feeding: Bed level;Maximal assistance Eating/Feeding Details (indicate cue type and reason): MAX A with hand over hand to perform hand to mouth with 3 scoops of ice cream. Requires moderate attempts at motivation to participate with OT on any level including to self feed.  Vision Baseline Vision/History: Wears glasses Wears Glasses: At all times Patient Visual Report: No change from baseline     Perception     Praxis      Cognition Arousal/Alertness: Awake/alert Behavior During Therapy: WFL for tasks assessed/performed Overall Cognitive Status: Difficult to assess                                 General Comments: Pt able to follow simple 1 step commands. Still somewhat difficult to thoroughly assess cognition as pt not wanting to address orientation and gives conflucting PLOF information. Ex: OT asks if pt's goal is still to walk again and pt says yes. But when asked an estimate of when she last  walked, stated "I don't know, don't start asking me questions." Then later in session, pt states her main goal right now is to be comfortable.        Exercises Other Exercises Other Exercises: OT attempts to engage pt in 1 set x10 reps of following exercises, but d/t significantly low tolerance this date, completed sets of only 5 reps. 2 set x5 reps elbow flex/ext, 1 set x5 reps straight arm raises (only can tolerate to ~1/3 arc of motion), 1 set x5 reps FWD punches (alternating), 1 set x5 reps digit flex/ext. Pt requires ~1-2 minute rest break between each of these UE exercises at bed level as well as MOD verbal/tactile cues for form/pace/technique and moderate cueing for motivation/participation.   Shoulder Instructions       General Comments      Pertinent Vitals/ Pain       Pain Assessment: Faces Faces Pain Scale: Hurts worst Pain Location: neck/trunk and LEs with any attempt to reposition, R UE with attempts to elevate d/t edema Pain Descriptors / Indicators: Aching;Grimacing;Guarding;Stabbing;Tender;Sharp;Crying (attempts made to reposition, but futile-pt refuses to be turned to her L although some signs of dependent edema in R side. Also, pt refuses to allow OT to place pillows to elevate LEs or create space between LEs to preven skin on skin.) Pain Intervention(s): Limited activity within patient's tolerance;Monitored during session;Patient requesting pain meds-RN notified;RN gave pain meds during session  Home Living                                          Prior Functioning/Environment              Frequency  Min 1X/week        Progress Toward Goals  OT Goals(current goals can now be found in the care plan section)  Progress towards OT goals: OT to reassess next treatment (pt not progressing towars goals as of this session, potentially limited by pain, but also potentially self-limiting. Will continue to assess for best goal setting.)  Acute Rehab  OT Goals Patient Stated Goal: to walk again OT Goal Formulation: With patient Time For Goal Achievement: 05/15/20 Potential to Achieve Goals: [facility] Discharge plan remains appropriate    Co-evaluation                 AM-PAC OT "6 Clicks" Daily Activity     Outcome Measure   Help from another person eating meals?: A Lot Help from another person taking care of personal grooming?: A Lot Help from another person toileting, which includes using toliet, bedpan, or urinal?: Total Help from  another person bathing (including washing, rinsing, drying)?: A Lot Help from another person to put on and taking off regular upper body clothing?: A Lot Help from another person to put on and taking off regular lower body clothing?: Total 6 Click Score: 10    End of Session    OT Visit Diagnosis: Unsteadiness on feet (R26.81);Other abnormalities of gait and mobility (R26.89);Muscle weakness (generalized) (M62.81);Pain   Activity Tolerance Patient limited by fatigue;Patient limited by pain   Patient Left in bed;with call bell/phone within reach;with bed alarm set;with nursing/sitter in room (nurse present to administer medication including pain medication. Notified of pt status and lack of eating during session. OT also messaged attending MD.)   Nurse Communication Other (comment) (tolerance, intake of breakfast items.)        Time: 1696-7893 OT Time Calculation (min): 38 min  Charges: OT General Charges $OT Visit: 1 Visit OT Treatments $Self Care/Home Management : 23-37 mins $Therapeutic Activity: 8-22 mins  Gerrianne Scale, MS, OTR/L ascom [phone] 05/03/20, 3:50 PM

## 2020-05-03 NOTE — Consult Note (Signed)
[facility] Nurse wound follow up Wound type: Routine NPWT dressing change. Patient is premedicated for pain Measurement:Per Friday. See note from my associate, D. Barbie Haggis. Wound bed: pale pink, 20% yellow fibrinous slough. Drainage (amount, consistency, odor) small amount serous Periwound: intact, dry Dressing procedure/placement/frequency: Two pieces of black foam removed, wound cleansed with NS. 1 piece of foam used to obliterate dead space, covered with drape. One skin barrier ring used to protect from 4-7 o'clock of distal wound periphery and enhance seal. One piece of drape used to protect right hip, 1 piece of foam used to bridge from wound to right hip. Bridge foam is covered with drape and dressing is attached to 177mmHg continuous negative pressure.  AN immediate seal is obtained. Dressing is labeled and patient is assisted to find comfortable position in bed by her Bedside RN and NT.  Next dressing change is Wednesday, 05/05/20.  [facility] nursing team will not follow, but will remain available to this patient, the nursing and medical teams.  Please re-consult if needed. Thanks, Maudie Flakes, MSN, RN, [place], Arther Abbott  Pager# [phone]

## 2020-05-03 NOTE — Progress Notes (Addendum)
Daily Progress Note   Patient Name: Tina Salinas       Date: 05/03/2020 DOB: January 11, 1946  Age: 74 y.o. MRN#: [id number] Attending Physician: Deatra James, MD Primary Care Physician: Birdie Sons, MD Admit Date: 04/21/2020  Reason for Consultation/Follow-up: Establishing goals of care, Non pain symptom management and Pain control  Subjective: Patient lying in bed resting. Easily aroused to verbal stimuli. Alert and oriented x3. Complains of sacral and leg pain which she states is constant and current regimen does not seem to be helping. Had one emesis episode earlier. Patient appears extremely ill, frail, and weak. Can barely lift head off of pillow. States "I guess I am dying".   I discussed at length with [name] my concerns for her health with education on her poor prognosis.  Patient is tearful expressing she knows that she is not doing well however she does not wish to acknowledge that at this time.  I use this opportunity to discuss with patient what quality of life means to her and what matters the most to her at this time.  Patient reports her quality of life is "horrible" and what matters to her most at this time is obtaining some sort of pain relief.  I discussed at length transitioning to comfort and focusing on symptom management.  We discussed the importance of when quality of life is poor sometimes shifting care from doing things to a patient and focusing on doing things for the patient to keep him comfortable is most important.  Patient verbalized understanding expressing she understands this concept however she is not going to make the decision to encourage her death.  I attempted to explain to patient she is not making a decision to encourage her death but a decision to focus on what matters most to her and allow her some comfort and relief with a goal of eliminating any suffering such as her current pain.  Offered several times throughout our discussion to include  patient's friend or someone of her choosing however she continues to decline expressing "no one will ever make any decisions for me and I have told you if something happens the medical team is asked to make those final decisions and you can then notify my friends at that point.  They are in no way to make any final decisions for me even if I cannot speak for myself!"   Patient states she is still competent to make her decisions and she is not interested in full comfort and or hospice care.  We discussed her perceived expectations for her health at this point.  Patient becomes tearful stating she does not know and feels that she will be with her husband soon.  I asked patient if she was worried about end-of-life also encouraging her to discuss her feelings.  Patient states she is not worried about dying because she knows she will be with her husband who she misses dearly and will be out of "this measurable body".  Therapeutic listening and support provided.  I again attempted to try to encourage patient to focus on her expressed feelings which aligned with comfort care measures.  Patient continued to decline however she is requesting medication to relieve her pain.  She confirms DNR/DNI  I discussed at length with [name] the use of medication to control her pain/symptom management.  Detailed education provided regarding possibility of increased lethargy and/or patient passing away due to respiratory depression with the use of IV pain medication  in the setting of her frail body and comorbidities.  [name] verbalized understanding expressing she just wants to be out of pain and if she passes away she is okay with that knowing she is comfortable.  All questions answered and support provided.  Length of Stay: 6 days  Vital Signs: BP 120/84 (BP Location: Left Arm)   Pulse (!) 102   Temp 98.2 F (36.8 C) (Oral)   Resp 20   Ht 5' (1.524 m)   Wt 43 kg   SpO2 94%   BMI 18.51 kg/m  SpO2: SpO2: 94  % O2 Device: O2 Device: Room Air O2 Flow Rate: O2 Flow Rate (L/min): 1 L/min  Physical Exam: A and O x3, frail, cachectic, chronically ill appearing Normal breathing pattern, 2L/[place]  Generalized weakness            Palliative Care Assessment & Plan  HPI: Palliative Care consult requested for goals of care discussion in this 74 y.o. female with multiple medical problems including cerebral palsy, combined congestive heart failure, hypertension, CKD3, rheumatoid arthritis, hyperlipidemia, proximal A. Fib. Patient presented to the ED with complaints of increasing pain, generalized weakness, and failure to thrive. She is nonambulatory. Patient spent several days pending SNF placement, however required admission due to UTI. Per notations patient is under the care of APS as she fired home health providers. Since admission patient has been evaluated by Surgery due to unstageable sacral decubitus. Debridement performed with wound vac in place. Psychiatry evaluated and deemed patient oriented with capacity to make decisions  Code Status: DNR  Goals of Care/Recommendations: Patient continues to decline opportunity to communicate with her listed friends and provide updates to her condition.  States she is capable of making her own decisions and at any point she is not able to do this as she is relying on the medical team to make the best decision for her at that time. Discussed at length with [name] her poor prognosis with recommendations for transitioning to full comfort and possible hospice care versus anticipated hospital death.  Patient verbalizes understanding however declines to make a firm decision on this.  Patient does however in conversation continues to express her goals for comfort and making note she is prepared to die/feels that she would die. She is requesting pain relief as this is her primary focus.  Detailed discussion and education regarding the use of IV pain medication to provide her  relief with awareness of the possibility of respiratory depression, increased lethargy, and even death.  Patient verbalized understanding expressing she is okay with that but does not want to continue to lay there and pain/suffering. Dilaudid as needed for pain Zofran as needed for nausea/vomiting PMT will continue to support and follow  Prognosis: Poor  Discharge Planning: To Be Determined  Thank you for allowing the Palliative Medicine Team to assist in the care of this patient.  Time Total:60 min.   Visit consisted of counseling and education dealing with the complex and emotionally intense issues of symptom management and palliative care in the setting of serious and potentially life-threatening illness.Greater than 50%  of this time was spent counseling and coordinating care related to the above assessment and plan.  Alda Lea, AGPCNP-BC  Palliative Medicine Team [phone]

## 2020-05-03 NOTE — Progress Notes (Signed)
VAST consulted to assess pt's RA DL PICC d/t swelling of right arm.  PICC remains in place with 2cm visualized beneath secure dressing. Both lumens with good blood return and flush easily. Pt verbalized her arm does not hurt. Bilateral arms cool to ouch with equal radial pulses. Pt tends to lay on her right side. Unit RN, Anabella reported patient also has some edema of her right abdomen as well so swelling of arm could be of dependent nature. Chest x-ray for PICC tip location ordered, as well as doppler of right arm to assess for DVT.

## 2020-05-03 NOTE — TOC Progression Note (Signed)
Transition of Care [facility]) - Progression Note    Patient Details  Name: Tina Salinas MRN: [id number] Date of Birth: 05-01-1946  Transition of Care [facility]) CM/SW [place], LCSW Phone Number: 05/03/2020, 1:37 PM  Clinical Narrative:  [facility] liaison is checking status of referral at their [facility] facility. Spoke to [employer], admissions coordinator at [employer] [facility]. She did not receive the referral that was sent on Friday so CSW sent again.  Expected Discharge Plan: Long Term Nursing Home Barriers to Discharge: ED Patient Insisting on an Alternate Living Situation/Facility  Expected Discharge Plan and Services Expected Discharge Plan: Long Term Nursing Home In-house Referral: Clinical Social Work   Post Acute Care Choice: Nursing Home Living arrangements for the past 2 months: Single Family Home                                       Social Determinants of Health (SDOH) Interventions    Readmission Risk Interventions No flowsheet data found.

## 2020-05-04 DIAGNOSIS — E440 Moderate protein-calorie malnutrition: Secondary | ICD-10-CM | POA: Diagnosis not present

## 2020-05-04 DIAGNOSIS — A419 Sepsis, unspecified organism: Secondary | ICD-10-CM | POA: Diagnosis not present

## 2020-05-04 DIAGNOSIS — G809 Cerebral palsy, unspecified: Secondary | ICD-10-CM

## 2020-05-04 DIAGNOSIS — F4321 Adjustment disorder with depressed mood: Secondary | ICD-10-CM | POA: Diagnosis not present

## 2020-05-04 DIAGNOSIS — L89153 Pressure ulcer of sacral region, stage 3: Secondary | ICD-10-CM | POA: Diagnosis not present

## 2020-05-04 DIAGNOSIS — I5042 Chronic combined systolic (congestive) and diastolic (congestive) heart failure: Secondary | ICD-10-CM | POA: Diagnosis not present

## 2020-05-04 DIAGNOSIS — N390 Urinary tract infection, site not specified: Secondary | ICD-10-CM | POA: Diagnosis not present

## 2020-05-04 MED ORDER — ONDANSETRON HCL 4 MG/2ML IJ SOLN
4.0000 mg | Freq: Four times a day (QID) | INTRAMUSCULAR | Status: DC | PRN
  Administered 2020-05-04 – 2020-05-05 (×2): 4 mg via INTRAVENOUS
  Filled 2020-05-04 (×2): qty 2

## 2020-05-04 MED ORDER — ENOXAPARIN SODIUM 60 MG/0.6ML ~~LOC~~ SOLN
1.0000 mg/kg | Freq: Two times a day (BID) | SUBCUTANEOUS | Status: DC
  Administered 2020-05-04 – 2020-05-07 (×7): 42.5 mg via SUBCUTANEOUS
  Filled 2020-05-04 (×8): qty 0.6

## 2020-05-04 NOTE — Progress Notes (Addendum)
Use of PICC line access continued per Attending MD (Dr. Beckie Busing) as of 05/03/2020 after right upper extremity Ultrasound revealed DVT.

## 2020-05-04 NOTE — Progress Notes (Signed)
Patient vomited right after taking oral medications. Patient given zofran.   Fuller Mandril, RN

## 2020-05-04 NOTE — Progress Notes (Signed)
PT Cancellation Note  Patient Details Name: Tina Salinas MRN: [id number] DOB: 06-08-1946   Cancelled Treatment:    Reason Eval/Treat Not Completed: Patient not medically ready;Other (comment). Patient with newly diagnosed  deep venous thrombosis visualized within the right subclavian, axillary, and basilic veins and just started on full dose of lovenox per notes. Also noted patient having pain control issues. Will hold and follow up after anticoagulation per guidelines.   Minna Merritts, PT, MPT  Percell Locus 05/04/2020, 2:34 PM

## 2020-05-04 NOTE — Progress Notes (Addendum)
PROGRESS NOTE    Tina Salinas  JKK:[id number] DOB: Jun 04, 1946 DOA: 04/21/2020 PCP: Birdie Sons, MD   Assessment & Plan:   Active Problems:   Adjustment disorder with depressed mood   Infantile cerebral palsy (HCC)   Chronic combined systolic and diastolic CHF (congestive heart failure) (HCC)   Malnutrition of moderate degree   Hypokalemia   Pressure injury of skin   Sepsis ([place])   Suspected sepsis: likely secondary to sacral decubitus. Sepsis resolved. Continue on IV zosyn. Continue on IVFs. Urine cx NGTD. Blood cxs NGTD  Lactic acidosis: resolved  Stage III sacral ulcer: s/p I&D on 04/28/20. Wound cx grew pseudomonas. Continue on IV zosyn. 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 Will continue to monitor   Diarrhea: GI PCR panel is neg. Imodium prn   Acute on chronic systolic CHF: s/p IV lasix. Monitor I/Os. Echo showed EF <20%.   CAD: cardiac cath done on 9/21 showed nonobstructive coronary artery disease.Continue on aspirin.  PAF: continue on amiodarone. Continue to hold carvedilol   HTN: continue on amiodarone   Hx of rheumatoid arthritis: continue on prednisone, methotrexate, hydroxychloroquine  Adjustment disorder with depressed mood: continue on celexa that was started this admission. Continue on remeron   Hyperlipidemia:continue on statin. Continue to hold carvedilol   Infantile cerebral palsy: bedridden. Continue w/ supportive care   Severe Malnutrition: BMI 18.51. Continue w/ nutritional supplements   ACD: no need for a transfusion at this time    DVT prophylaxis: lovenox Code Status: DNR Family Communication:   Disposition Plan: likely d/c to SNF   Status is: Inpatient  Remains inpatient appropriate because:Unsafe d/c plan and IV treatments appropriate due to intensity of illness or inability to take PO, CM is working on d/c placement at long  term nursing home    Dispo: The patient is from: Home              Anticipated d/c is to: SNF vs long term nursing home               Anticipated d/c date is: 2 days              Patient currently is not medically stable to d/c.    Consultants:   General surg    Procedures:    Antimicrobials: zosyn    Subjective: Pt c/o nausea, vomiting and leg pain   Objective: Vitals:   05/03/20 1942 05/03/20 2134 05/03/20 2333 05/04/20 0510  BP: 105/68  101/65 120/82  Pulse: 80  72 91  Resp: 20  18 19   Temp: 97.8 F (36.6 C)  97.7 F (36.5 C) 97.7 F (36.5 C)  TempSrc: Oral  Oral Oral  SpO2: 100% 97% 97% 100%  Weight:      Height:        Intake/Output Summary (Last 24 hours) at 05/04/2020 0802 Last data filed at 05/04/2020 0500 Gross per 24 hour  Intake 340 ml  Output 1250 ml  Net -910 ml   Filed Weights   04/21/20 1238 04/28/20 1251  Weight: 43 kg 43 kg    Examination:  General exam: Appears uncomfortable. Frail appearing. Appears older than stated age  Respiratory system: diminished breath sounds b/l  Cardiovascular system: S1 & S2 +. No rubs, gallops or clicks. . Gastrointestinal system: Abdomen is nondistended, soft and nontender.  Hyperactive bowel sounds heard. Central nervous system: Alert  and oriented.  Psychiatry: Judgement and insight appear abnormal. Flat mood and affect    Data Reviewed: I have personally reviewed following labs and imaging studies  CBC: Recent Labs  Lab 04/28/20 0458 04/28/20 0458 04/29/20 0313 04/30/20 0515 05/01/20 0317 05/02/20 0448 05/03/20 0327  WBC 8.7   < > 5.9 11.4* 11.5* 12.5* 11.6*  NEUTROABS 6.2  --   --   --   --   --   --   HGB 8.3*   < > 8.3* 8.0* 8.8* 8.2* 8.2*  HCT 27.6*   < > 28.6* 27.3* 30.8* 27.5* 28.1*  MCV 92.3   < > 95.0 94.8 98.1 96.2 96.9  PLT 293   < > 304 423* 394 416* 461*   < > = values in this interval not displayed.   Basic Metabolic Panel: Recent Labs  Lab 04/28/20 0458 04/28/20 0458  04/29/20 0313 04/30/20 0515 05/01/20 0317 05/02/20 0448 05/03/20 0327  NA 142   < > 145 146* 144 141 141  K 2.9*   < > 3.9 3.8 4.6 4.5 4.6  CL 115*   < > 117* 118* 119* 115* 112*  CO2 20*   < > 20* 20* 19* 20* 24  GLUCOSE 79   < > 120* 119* 105* 125* 143*  BUN 10   < > 9 15 14 14 17   CREATININE 0.64   < > 0.64 0.84 0.80 0.83 0.89  CALCIUM 7.5*   < > 7.0* 7.3* 7.3* 7.5* 7.7*  MG 1.7  --   --   --   --   --   --    < > = values in this interval not displayed.   GFR: Estimated Creatinine Clearance: 38.8 mL/min (by C-G formula based on SCr of 0.89 mg/dL). Liver Function Tests: No results for input(s): AST, ALT, ALKPHOS, BILITOT, PROT, ALBUMIN in the last 168 hours. No results for input(s): LIPASE, AMYLASE in the last 168 hours. No results for input(s): AMMONIA in the last 168 hours. Coagulation Profile: No results for input(s): INR, PROTIME in the last 168 hours. Cardiac Enzymes: No results for input(s): CKTOTAL, CKMB, CKMBINDEX, TROPONINI in the last 168 hours. BNP (last 3 results) No results for input(s): PROBNP in the last 8760 hours. HbA1C: No results for input(s): HGBA1C in the last 72 hours. CBG: No results for input(s): GLUCAP in the last 168 hours. Lipid Profile: No results for input(s): CHOL, HDL, LDLCALC, TRIG, CHOLHDL, LDLDIRECT in the last 72 hours. Thyroid Function Tests: No results for input(s): TSH, T4TOTAL, FREET4, T3FREE, THYROIDAB in the last 72 hours. Anemia Panel: No results for input(s): VITAMINB12, FOLATE, FERRITIN, TIBC, IRON, RETICCTPCT in the last 72 hours. Sepsis Labs: No results for input(s): PROCALCITON, LATICACIDVEN in the last 168 hours.  Recent Results (from the past 240 hour(s))  Urine culture     Status: Abnormal   Collection Time: 04/26/20  2:58 PM   Specimen: Urine, Random  Result Value Ref Range Status   Specimen Description   Final    URINE, RANDOM Performed at [facility], [address], [place], [place] [address]     Special Requests   Final    NONE Performed at [facility], [address] [place], [place] [address]    Culture MULTIPLE SPECIES PRESENT, SUGGEST RECOLLECTION (A)  Final   Report Status 04/27/2020 FINAL  Final  Gastrointestinal Panel by PCR , Stool     Status: None   Collection Time: 04/26/20  2:58  PM   Specimen: Stool  Result Value Ref Range Status   Campylobacter species NOT DETECTED NOT DETECTED Final   Plesimonas shigelloides NOT DETECTED NOT DETECTED Final   Salmonella species NOT DETECTED NOT DETECTED Final   Yersinia enterocolitica NOT DETECTED NOT DETECTED Final   Vibrio species NOT DETECTED NOT DETECTED Final   Vibrio cholerae NOT DETECTED NOT DETECTED Final   Enteroaggregative E coli (EAEC) NOT DETECTED NOT DETECTED Final   Enteropathogenic E coli (EPEC) NOT DETECTED NOT DETECTED Final   Enterotoxigenic E coli (ETEC) NOT DETECTED NOT DETECTED Final   Shiga like toxin producing E coli (STEC) NOT DETECTED NOT DETECTED Final   Shigella/Enteroinvasive E coli (EIEC) NOT DETECTED NOT DETECTED Final   Cryptosporidium NOT DETECTED NOT DETECTED Final   Cyclospora cayetanensis NOT DETECTED NOT DETECTED Final   Entamoeba histolytica NOT DETECTED NOT DETECTED Final   Giardia lamblia NOT DETECTED NOT DETECTED Final   Adenovirus F40/41 NOT DETECTED NOT DETECTED Final   Astrovirus NOT DETECTED NOT DETECTED Final   Norovirus GI/GII NOT DETECTED NOT DETECTED Final   Rotavirus A NOT DETECTED NOT DETECTED Final   Sapovirus (I, II, IV, and V) NOT DETECTED NOT DETECTED Final    Comment: Performed at [facility], [address] [place], [place] [address]  Respiratory Panel by RT PCR (Flu A&B, Covid) - Nasopharyngeal Swab     Status: None   Collection Time: 04/26/20  3:51 PM   Specimen: Nasopharyngeal Swab  Result Value Ref Range Status   SARS Coronavirus 2 by RT PCR NEGATIVE NEGATIVE Final    Comment: (NOTE) SARS-CoV-2 target nucleic acids are NOT DETECTED.  The  SARS-CoV-2 RNA is generally detectable in upper respiratoy specimens during the acute phase of infection. The lowest concentration of SARS-CoV-2 viral copies this assay can detect is 131 copies/mL. A negative result does not preclude SARS-Cov-2 infection and should not be used as the sole basis for treatment or other patient management decisions. A negative result may occur with  improper specimen collection/handling, submission of specimen other than nasopharyngeal swab, presence of viral mutation(s) within the areas targeted by this assay, and inadequate number of viral copies (<131 copies/mL). A negative result must be combined with clinical observations, patient history, and epidemiological information. The expected result is Negative.  Fact Sheet for Patients:  PinkCheek.be  Fact Sheet for Healthcare Providers:  GravelBags.it  This test is no t yet approved or cleared by the [place] FDA and  has been authorized for detection and/or diagnosis of SARS-CoV-2 by FDA under an Emergency Use Authorization (EUA). This EUA will remain  in effect (meaning this test can be used) for the duration of the COVID-19 declaration under Section 564(b)(1) of the Act, 21 U.S.C. section 360bbb-3(b)(1), unless the authorization is terminated or revoked sooner.     Influenza A by PCR NEGATIVE NEGATIVE Final   Influenza B by PCR NEGATIVE NEGATIVE Final    Comment: (NOTE) The Xpert Xpress SARS-CoV-2/FLU/RSV assay is intended as an aid in  the diagnosis of influenza from Nasopharyngeal swab specimens and  should not be used as a sole basis for treatment. Nasal washings and  aspirates are unacceptable for Xpert Xpress SARS-CoV-2/FLU/RSV  testing.  Fact Sheet for Patients: PinkCheek.be  Fact Sheet for Healthcare Providers: GravelBags.it  This test is not yet approved or cleared  by the [place] FDA and  has been authorized for detection and/or diagnosis of SARS-CoV-2 by  FDA under an Emergency Use Authorization (EUA). This EUA  will remain  in effect (meaning this test can be used) for the duration of the  Covid-19 declaration under Section 564(b)(1) of the Act, 21  U.S.C. section 360bbb-3(b)(1), unless the authorization is  terminated or revoked. Performed at [facility], [address] [place], [place] [address]   Aerobic/Anaerobic Culture (surgical/deep wound)     Status: None   Collection Time: 04/28/20  2:10 PM   Specimen: Wound; Tissue  Result Value Ref Range Status   Specimen Description   Final    WOUND Performed at [facility], [address] [place], [place] [address]    Special Requests   Final    TISSUE FROM SACRAL DECUBITIS WOUND Performed at [facility], [address] [place], [place] [address]    Gram Stain   Final    ABUNDANT WBC PRESENT, PREDOMINANTLY PMN FEW GRAM NEGATIVE RODS    Culture   Final    FEW PSEUDOMONAS AERUGINOSA FEW ENTEROCOCCUS FAECALIS FEW BACTEROIDES SPECIES BETA LACTAMASE POSITIVE Performed at [facility], [place] [address] [place], [place] [address]    Report Status 05/02/2020 FINAL  Final   Organism ID, Bacteria PSEUDOMONAS AERUGINOSA  Final   Organism ID, Bacteria ENTEROCOCCUS FAECALIS  Final      Susceptibility   Enterococcus faecalis - MIC*    AMPICILLIN <=2 SENSITIVE Sensitive     VANCOMYCIN 2 SENSITIVE Sensitive     GENTAMICIN SYNERGY SENSITIVE Sensitive     * FEW ENTEROCOCCUS FAECALIS   Pseudomonas aeruginosa - MIC*    CEFTAZIDIME 4 SENSITIVE Sensitive     CIPROFLOXACIN <=0.25 SENSITIVE Sensitive     GENTAMICIN <=1 SENSITIVE Sensitive     IMIPENEM 2 SENSITIVE Sensitive     PIP/TAZO 16 SENSITIVE Sensitive     CEFEPIME 2 SENSITIVE Sensitive     * FEW PSEUDOMONAS AERUGINOSA  Urine Culture     Status: Abnormal   Collection Time: 04/29/20  4:30 AM    Specimen: Urine, Random  Result Value Ref Range Status   Specimen Description   Final    URINE, RANDOM Performed at [facility], [address] [place], [place] [address]    Special Requests   Final    NONE Performed at [facility], [address] [place], [place] [address]    Culture 40,000 COLONIES/mL YEAST (A)  Final   Report Status 04/30/2020 FINAL  Final         Radiology Studies: US Venous Img Upper Uni Right(DVT)  Result Date: 05/03/2020 CLINICAL DATA:  Central line placement. EXAM: Right UPPER EXTREMITY VENOUS DOPPLER ULTRASOUND TECHNIQUE: Gray-scale sonography with graded compression, as well as color Doppler and duplex ultrasound were performed to evaluate the upper extremity deep venous system from the level of the subclavian vein and including the jugular, axillary, basilic, radial, ulnar and upper cephalic vein. Spectral Doppler was utilized to evaluate flow at rest and with distal augmentation maneuvers. COMPARISON:  None. FINDINGS: Contralateral Subclavian Vein: Respiratory phasicity is normal and symmetric with the symptomatic side. No evidence of thrombus. Normal compressibility. Internal Jugular Vein: No evidence of thrombus. Normal compressibility, respiratory phasicity and response to augmentation. Subclavian Vein: Noncompressible with no flow consistent with occlusive thrombus. Axillary Vein: Noncompressible with no flow consistent with occlusive thrombus. Cephalic Vein: Not visualized. Basilic Vein: Noncompressible with no flow consistent with occlusive thrombus. Brachial Veins: No evidence of thrombus. Normal compressibility, respiratory phasicity and response to augmentation. Radial Veins: No evidence of thrombus. Normal compressibility, respiratory phasicity and  response to augmentation. Ulnar Veins: No evidence of thrombus. Normal compressibility, respiratory phasicity and response to augmentation. Venous Reflux:  None visualized. Other  Findings:  None visualized. IMPRESSION: Deep venous thrombosis is seen within the right subclavian, axillary and basilic veins. Electronically Signed   By: Marijo Conception M.D.   On: 05/03/2020 15:17   DG Chest Port 1 View  Result Date: 05/03/2020 CLINICAL DATA:  PICC placement EXAM: PORTABLE CHEST 1 VIEW COMPARISON:  04/21/2020 FINDINGS: Right arm PICC tip at the cavoatrial junction. Progression of diffuse bilateral airspace disease. Progression of left lower lobe collapse and left effusion. Small right effusion. IMPRESSION: PICC tip at the cavoatrial junction Worsening bilateral airspace disease consistent with pneumonia. Electronically Signed   By: Franchot Gallo M.D.   On: 05/03/2020 13:49        Scheduled Meds: . amiodarone  200 mg Oral Daily  . vitamin C  250 mg Oral BID  . aspirin EC  81 mg Oral Daily  . calcium-vitamin D  2 tablet Oral Q breakfast  . Chlorhexidine Gluconate Cloth  6 each Topical Daily  . cholecalciferol  1,000 Units Oral Daily  . citalopram  10 mg Oral Daily  . docusate sodium  100 mg Oral BID  . enoxaparin (LOVENOX) injection  30 mg Subcutaneous Q24H  . feeding supplement  237 mL Oral TID BM  . ferrous [id number] C-folic acid  1 capsule Oral BID PC  . fluconazole  200 mg Oral Daily  . hydroxychloroquine  400 mg Oral Daily  . ipratropium  0.5 mg Nebulization Q8H  . latanoprost  1 drop Both Eyes QHS  . levalbuterol  0.63 mg Nebulization Q8H  . megestrol  400 mg Oral Daily  . mirtazapine  15 mg Oral QHS  . multivitamin-lutein  1 capsule Oral Daily  . oxyCODONE  10 mg Oral Q12H  . potassium chloride  20 mEq Oral Daily  . potassium chloride  40 mEq Oral Once  . pravastatin  40 mg Oral QHS  . predniSONE  2.5 mg Oral Q breakfast  . sodium chloride flush  10-40 mL Intracatheter Q12H   Continuous Infusions: . piperacillin-tazobactam (ZOSYN)  IV 3.375 g (05/03/20 2332)     LOS: 7 days    Time spent: 32 mins     Wyvonnia Dusky, MD [facility] Pager [phone]  If 7PM-7AM, please contact night-coverage [email or web address] 05/04/2020, 8:02 AM

## 2020-05-04 NOTE — Progress Notes (Signed)
Daily Progress Note   Patient Name: Tina Salinas       Date: 05/04/2020 DOB: 02/07/1946  Age: 74 y.o. MRN#: [id number] Attending Physician: Wyvonnia Dusky, MD Primary Care Physician: Birdie Sons, MD Admit Date: 04/21/2020  Reason for Consultation/Follow-up: Non pain symptom management and Pain control  Subjective: Chart reviewed and updates received by RN.  Patient resting in bed.  Easily aroused with verbal stimuli.  She is alert and oriented x3.  Able to identify who I am and immediately expresses her concerns with our previous conversation on yesterday.  Patient states "I thought you were ordering me pain medicine I have been miserable since yesterday and have not received any".  As needed Dilaudid has been ordered since yesterday and after reviewing the [facility] patient did not receive any as needed medications.  Patient is tearful expressing she is in severe pain.  RN is aware and preparing to administer.  Patient states she threw up again this morning after trying to take her pills.  She continues to express that she knows that she is not doing well however she also continues to decline to transition to comfort and or hospice facilities.  Patient continues to express similarly to what she has said yesterday she wishes to receive aggressive pain intervention and not continue to lie in bed and suffer.  She expresses her understanding of the risk and again noting she is at peace with that (meaning if she was to pass away) but that her main focus is on her comfort and pain relief.  I again offered patient to allow me to contact her friend Tina Salinas and provide updates in addition to support for Tina Salinas however she declines stating she does not want anyone to be called.  She reports land call several times yesterday and this morning but she did not speak to her because she does not feel good.  Patient understands we will continue to address her pain issues and I encouraged her to focus  holistically on her comfort by transitioning to comfort measures as this aligns with what she is expressing.  Length of Stay: 7 days  Vital Signs: BP 112/72 (BP Location: Left Arm)   Pulse 85   Temp (!) 97.2 F (36.2 C)   Resp 19   Ht 5' (1.524 m)   Wt 43 kg   SpO2 100%   BMI 18.51 kg/m  SpO2: SpO2: 100 % O2 Device: O2 Device: Nasal Cannula O2 Flow Rate: O2 Flow Rate (L/min): 1 L/min  Physical Exam: NAD, frail, chronically ill-appearing            Normal breathing pattern, diminished bilaterally A and O x3, flat affect Palliative Care Assessment & Plan   Code Status: DNR  Goals of Care/Recommendations: Patient continues to decline transitioning care to focus on full comfort and/or hospice care.  Also continues to decline to allow myself to communicate with her close friend Tina Salinas. Patient continues to express she wishes the focus of her care to be on pain relief/elimination of suffering which I agree with and would highly recommend patient to consider for comfort measures.  Patient is aware of her poor prognosis. Continue with as needed medications for pain/nausea. PMT will continue to support and follow  Prognosis: Poor  Discharge Planning: To Be Determined  Thank you for allowing the Palliative Medicine Team to assist in the care of this patient.  Time Total: 40 min  Visit consisted of counseling and education dealing with the  complex and emotionally intense issues of symptom management and palliative care in the setting of serious and potentially life-threatening illness.Greater than 50%  of this time was spent counseling and coordinating care related to the above assessment and plan.  Alda Lea, AGPCNP-BC  Palliative Medicine Team [phone]

## 2020-05-05 LAB — BASIC METABOLIC PANEL
Anion gap: 10 (ref 5–15)
BUN: 22 mg/dL (ref 8–23)
CO2: 22 mmol/L (ref 22–32)
Calcium: 7.8 mg/dL — ABNORMAL LOW (ref 8.9–10.3)
Chloride: 109 mmol/L (ref 98–111)
Creatinine, Ser: 1.06 mg/dL — ABNORMAL HIGH (ref 0.44–1.00)
GFR, Estimated: 52 mL/min — ABNORMAL LOW (ref >60)
Glucose, Bld: 90 mg/dL (ref 70–99)
Potassium: 4.4 mmol/L (ref 3.5–5.1)
Sodium: 141 mmol/L (ref 135–145)

## 2020-05-05 LAB — CBC
HCT: 25.8 % — ABNORMAL LOW (ref 36.0–46.0)
Hemoglobin: 7.5 g/dL — ABNORMAL LOW (ref 12.0–15.0)
MCH: 28.4 pg (ref 26.0–34.0)
MCHC: 29.1 g/dL — ABNORMAL LOW (ref 30.0–36.0)
MCV: 97.7 fL (ref 80.0–100.0)
Platelets: 516 10*3/uL — ABNORMAL HIGH (ref 150–400)
RBC: 2.64 MIL/uL — ABNORMAL LOW (ref 3.87–5.11)
RDW: 24.2 % — ABNORMAL HIGH (ref 11.5–15.5)
WBC: 10.2 10*3/uL (ref 4.0–10.5)
nRBC: 4.1 % — ABNORMAL HIGH (ref 0.0–0.2)

## 2020-05-05 MED ORDER — SODIUM CHLORIDE 0.9 % IV SOLN
INTRAVENOUS | Status: DC | PRN
  Administered 2020-05-05: 250 mL via INTRAVENOUS

## 2020-05-05 MED ORDER — POLYETHYLENE GLYCOL 3350 17 G PO PACK
17.0000 g | PACK | Freq: Every day | ORAL | Status: DC
  Administered 2020-05-06 – 2020-05-07 (×2): 17 g via ORAL
  Filled 2020-05-05 (×2): qty 1

## 2020-05-05 NOTE — Progress Notes (Signed)
PT Cancellation Note  Patient Details Name: Tina Salinas MRN: [id number] DOB: July 03, 1946   Cancelled Treatment:   Per discussion with nurse, pt not appropriate for tx today due to change in mental status and swallowing difficulties    [name] 05/05/2020, 3:49 PM

## 2020-05-05 NOTE — Progress Notes (Signed)
PROGRESS NOTE    Tina Salinas  QPR:[id number] DOB: Feb 16, 1946 DOA: 04/21/2020 PCP: Birdie Sons, MD   Assessment & Plan:   Active Problems:   Adjustment disorder with depressed mood   Infantile cerebral palsy (HCC)   Chronic combined systolic and diastolic CHF (congestive heart failure) (HCC)   Malnutrition of moderate degree   Hypokalemia   Pressure injury of skin   Sepsis ([place])   Suspected sepsis: likely secondary to sacral decubitus. Sepsis resolved. Continue on IV zosyn. Continue on IVFs. Urine cx NGTD. Blood cxs NGTD  Lactic acidosis: resolved  Stage III sacral ulcer: s/p I&D on 04/28/20. Wound cx grew pseudomonas. Continue on IV zosyn, will need a total of 14 day course. 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 Will continue to monitor  Dysphagia: speech consulted   Diarrhea: GI PCR panel is neg. Imodium prn   Acute on chronic systolic CHF: s/p IV lasix. Monitor I/Os. Echo showed EF <20%.   CAD: cardiac cath done on 9/21 showed nonobstructive coronary artery disease.Continue on aspirin.  PAF: continue on amiodarone. Continue to hold BB   HTN: continue on amiodarone   Hx of rheumatoid arthritis: continue on prednisone, methotrexate, hydroxychloroquine  Adjustment disorder with depressed mood: continue on celexa that was started this admission. Continue on remeron   Hyperlipidemia:continue on statin. Continue to hold carvedilol   Infantile cerebral palsy: bedridden. Continue w/ supportive care   Severe malnutrition: BMI 18.51. Continue w/ nutritional supplements   Failure to thrive: secondary to all above. Palliative care following. Pt will not agree to comfort care at this time.   Thrombocytosis: etiology unclear. Will continue to monitor   ACD: will transfuse if Hb <7.0. Will continue to monitor    DVT prophylaxis: lovenox Code Status:  DNR Family Communication:  Disposition Plan: likely d/c to SNF   Status is: Inpatient  Remains inpatient appropriate because:Unsafe d/c plan and IV treatments appropriate due to intensity of illness or inability to take PO, CM is working on d/c placement at long term nursing home    Dispo: The patient is from: Home              Anticipated d/c is to: SNF vs long term nursing home               Anticipated d/c date is: 2 days              Patient currently is not medically stable to d/c.    Consultants:   General surg    Procedures:    Antimicrobials: zosyn    Subjective: Pt c/o generalized pain.   Objective: Vitals:   05/05/20 0212 05/05/20 0251 05/05/20 0413 05/05/20 0543  BP: (!) 99/58 111/66 103/65   Pulse:  81 75   Resp:  (!) 21 (!) 22   Temp:  98.9 F (37.2 C) 97.7 F (36.5 C)   TempSrc:   Oral   SpO2:  100% 100% 100%  Weight:      Height:        Intake/Output Summary (Last 24 hours) at 05/05/2020 0752 Last data filed at 05/04/2020 1900 Gross per 24 hour  Intake 69.54 ml  Output 0 ml  Net 69.54 ml   Filed Weights   04/21/20 1238 04/28/20 1251  Weight: 43 kg 43 kg    Examination:  General exam: Appears uncomfortable. Frail appearing. Appears  older than stated age Respiratory system: decreased breath sounds b/l Cardiovascular system: S1/S2+. No gallops or rubs. Gastrointestinal system: Abdomen is nondistended, soft and nontender.  Hyperactive bowel sounds heard. Central nervous system: Awake. Moves all extremities Psychiatry: Judgement and insight appear abnormal. Flat mood and affect    Data Reviewed: I have personally reviewed following labs and imaging studies  CBC: Recent Labs  Lab 04/30/20 0515 05/01/20 0317 05/02/20 0448 05/03/20 0327 05/05/20 0510  WBC 11.4* 11.5* 12.5* 11.6* PENDING  HGB 8.0* 8.8* 8.2* 8.2* 7.5*  HCT 27.3* 30.8* 27.5* 28.1* 25.8*  MCV 94.8 98.1 96.2 96.9 97.7  PLT 423* 394 416* 461* 497*   Basic Metabolic  Panel: Recent Labs  Lab 04/30/20 0515 05/01/20 0317 05/02/20 0448 05/03/20 0327 05/05/20 0510  NA 146* 144 141 141 141  K 3.8 4.6 4.5 4.6 4.4  CL 118* 119* 115* 112* 109  CO2 20* 19* 20* 24 22  GLUCOSE 119* 105* 125* 143* 90  BUN 15 14 14 17 22   CREATININE 0.84 0.80 0.83 0.89 1.06*  CALCIUM 7.3* 7.3* 7.5* 7.7* 7.8*   GFR: Estimated Creatinine Clearance: 32.6 mL/min (A) (by C-G formula based on SCr of 1.06 mg/dL (H)). Liver Function Tests: No results for input(s): AST, ALT, ALKPHOS, BILITOT, PROT, ALBUMIN in the last 168 hours. No results for input(s): LIPASE, AMYLASE in the last 168 hours. No results for input(s): AMMONIA in the last 168 hours. Coagulation Profile: No results for input(s): INR, PROTIME in the last 168 hours. Cardiac Enzymes: No results for input(s): CKTOTAL, CKMB, CKMBINDEX, TROPONINI in the last 168 hours. BNP (last 3 results) No results for input(s): PROBNP in the last 8760 hours. HbA1C: No results for input(s): HGBA1C in the last 72 hours. CBG: No results for input(s): GLUCAP in the last 168 hours. Lipid Profile: No results for input(s): CHOL, HDL, LDLCALC, TRIG, CHOLHDL, LDLDIRECT in the last 72 hours. Thyroid Function Tests: No results for input(s): TSH, T4TOTAL, FREET4, T3FREE, THYROIDAB in the last 72 hours. Anemia Panel: No results for input(s): VITAMINB12, FOLATE, FERRITIN, TIBC, IRON, RETICCTPCT in the last 72 hours. Sepsis Labs: No results for input(s): PROCALCITON, LATICACIDVEN in the last 168 hours.  Recent Results (from the past 240 hour(s))  Urine culture     Status: Abnormal   Collection Time: 04/26/20  2:58 PM   Specimen: Urine, Random  Result Value Ref Range Status   Specimen Description   Final    URINE, RANDOM Performed at [facility], [address], [place], [place] [address]    Special Requests   Final    NONE Performed at [facility], [address] [place], [place] [address]    Culture MULTIPLE  SPECIES PRESENT, SUGGEST RECOLLECTION (A)  Final   Report Status 04/27/2020 FINAL  Final  Gastrointestinal Panel by PCR , Stool     Status: None   Collection Time: 04/26/20  2:58 PM   Specimen: Stool  Result Value Ref Range Status   Campylobacter species NOT DETECTED NOT DETECTED Final   Plesimonas shigelloides NOT DETECTED NOT DETECTED Final   Salmonella species NOT DETECTED NOT DETECTED Final   Yersinia enterocolitica NOT DETECTED NOT DETECTED Final   Vibrio species NOT DETECTED NOT DETECTED Final   Vibrio cholerae NOT DETECTED NOT DETECTED Final   Enteroaggregative E coli (EAEC) NOT DETECTED NOT DETECTED Final   Enteropathogenic E coli (EPEC) NOT DETECTED NOT DETECTED Final   Enterotoxigenic E coli (ETEC) NOT DETECTED NOT DETECTED Final   Shiga  like toxin producing E coli (STEC) NOT DETECTED NOT DETECTED Final   Shigella/Enteroinvasive E coli (EIEC) NOT DETECTED NOT DETECTED Final   Cryptosporidium NOT DETECTED NOT DETECTED Final   Cyclospora cayetanensis NOT DETECTED NOT DETECTED Final   Entamoeba histolytica NOT DETECTED NOT DETECTED Final   Giardia lamblia NOT DETECTED NOT DETECTED Final   Adenovirus F40/41 NOT DETECTED NOT DETECTED Final   Astrovirus NOT DETECTED NOT DETECTED Final   Norovirus GI/GII NOT DETECTED NOT DETECTED Final   Rotavirus A NOT DETECTED NOT DETECTED Final   Sapovirus (I, II, IV, and V) NOT DETECTED NOT DETECTED Final    Comment: Performed at [facility], [address], [place], [place] [address]  Respiratory Panel by RT PCR (Flu A&B, Covid) - Nasopharyngeal Swab     Status: None   Collection Time: 04/26/20  3:51 PM   Specimen: Nasopharyngeal Swab  Result Value Ref Range Status   SARS Coronavirus 2 by RT PCR NEGATIVE NEGATIVE Final    Comment: (NOTE) SARS-CoV-2 target nucleic acids are NOT DETECTED.  The SARS-CoV-2 RNA is generally detectable in upper respiratoy specimens during the acute phase of infection. The lowest concentration of  SARS-CoV-2 viral copies this assay can detect is 131 copies/mL. A negative result does not preclude SARS-Cov-2 infection and should not be used as the sole basis for treatment or other patient management decisions. A negative result may occur with  improper specimen collection/handling, submission of specimen other than nasopharyngeal swab, presence of viral mutation(s) within the areas targeted by this assay, and inadequate number of viral copies (<131 copies/mL). A negative result must be combined with clinical observations, patient history, and epidemiological information. The expected result is Negative.  Fact Sheet for Patients:  PinkCheek.be  Fact Sheet for Healthcare Providers:  GravelBags.it  This test is no t yet approved or cleared by the [place] FDA and  has been authorized for detection and/or diagnosis of SARS-CoV-2 by FDA under an Emergency Use Authorization (EUA). This EUA will remain  in effect (meaning this test can be used) for the duration of the COVID-19 declaration under Section 564(b)(1) of the Act, 21 U.S.C. section 360bbb-3(b)(1), unless the authorization is terminated or revoked sooner.     Influenza A by PCR NEGATIVE NEGATIVE Final   Influenza B by PCR NEGATIVE NEGATIVE Final    Comment: (NOTE) The Xpert Xpress SARS-CoV-2/FLU/RSV assay is intended as an aid in  the diagnosis of influenza from Nasopharyngeal swab specimens and  should not be used as a sole basis for treatment. Nasal washings and  aspirates are unacceptable for Xpert Xpress SARS-CoV-2/FLU/RSV  testing.  Fact Sheet for Patients: PinkCheek.be  Fact Sheet for Healthcare Providers: GravelBags.it  This test is not yet approved or cleared by the [place] FDA and  has been authorized for detection and/or diagnosis of SARS-CoV-2 by  FDA under an Emergency Use  Authorization (EUA). This EUA will remain  in effect (meaning this test can be used) for the duration of the  Covid-19 declaration under Section 564(b)(1) of the Act, 21  U.S.C. section 360bbb-3(b)(1), unless the authorization is  terminated or revoked. Performed at [facility], [address] [place], [place] [address]   Aerobic/Anaerobic Culture (surgical/deep wound)     Status: None   Collection Time: 04/28/20  2:10 PM   Specimen: Wound; Tissue  Result Value Ref Range Status   Specimen Description   Final    WOUND Performed at [facility], [place],  [place], [place] [address]    Special Requests   Final    TISSUE FROM SACRAL DECUBITIS WOUND Performed at [facility], [address] [place], [place] [address]    Gram Stain   Final    ABUNDANT WBC PRESENT, PREDOMINANTLY PMN FEW GRAM NEGATIVE RODS    Culture   Final    FEW PSEUDOMONAS AERUGINOSA FEW ENTEROCOCCUS FAECALIS FEW BACTEROIDES SPECIES BETA LACTAMASE POSITIVE Performed at [facility], [place] [address], [place], [place] [address]    Report Status 05/02/2020 FINAL  Final   Organism ID, Bacteria PSEUDOMONAS AERUGINOSA  Final   Organism ID, Bacteria ENTEROCOCCUS FAECALIS  Final      Susceptibility   Enterococcus faecalis - MIC*    AMPICILLIN <=2 SENSITIVE Sensitive     VANCOMYCIN 2 SENSITIVE Sensitive     GENTAMICIN SYNERGY SENSITIVE Sensitive     * FEW ENTEROCOCCUS FAECALIS   Pseudomonas aeruginosa - MIC*    CEFTAZIDIME 4 SENSITIVE Sensitive     CIPROFLOXACIN <=0.25 SENSITIVE Sensitive     GENTAMICIN <=1 SENSITIVE Sensitive     IMIPENEM 2 SENSITIVE Sensitive     PIP/TAZO 16 SENSITIVE Sensitive     CEFEPIME 2 SENSITIVE Sensitive     * FEW PSEUDOMONAS AERUGINOSA  Urine Culture     Status: Abnormal   Collection Time: 04/29/20  4:30 AM   Specimen: Urine, Random  Result Value Ref Range Status   Specimen Description   Final    URINE, RANDOM Performed at [facility], [address], [place], [place] [address]    Special Requests   Final    NONE Performed at [facility], [address] [place],  [address]    Culture 40,000 COLONIES/mL YEAST (A)  Final   Report Status 04/30/2020 FINAL  Final         Radiology Studies: US Venous Img Upper Uni Right(DVT)  Result Date: 05/03/2020 CLINICAL DATA:  Central line placement. EXAM: Right UPPER EXTREMITY VENOUS DOPPLER ULTRASOUND TECHNIQUE: Gray-scale sonography with graded compression, as well as color Doppler and duplex ultrasound were performed to evaluate the upper extremity deep venous system from the level of the subclavian vein and including the jugular, axillary, basilic, radial, ulnar and upper cephalic vein. Spectral Doppler was utilized to evaluate flow at rest and with distal augmentation maneuvers. COMPARISON:  None. FINDINGS: Contralateral Subclavian Vein: Respiratory phasicity is normal and symmetric with the symptomatic side. No evidence of thrombus. Normal compressibility. Internal Jugular Vein: No evidence of thrombus. Normal compressibility, respiratory phasicity and response to augmentation. Subclavian Vein: Noncompressible with no flow consistent with occlusive thrombus. Axillary Vein: Noncompressible with no flow consistent with occlusive thrombus. Cephalic Vein: Not visualized. Basilic Vein: Noncompressible with no flow consistent with occlusive thrombus. Brachial Veins: No evidence of thrombus. Normal compressibility, respiratory phasicity and response to augmentation. Radial Veins: No evidence of thrombus. Normal compressibility, respiratory phasicity and response to augmentation. Ulnar Veins: No evidence of thrombus. Normal compressibility, respiratory phasicity and response to augmentation. Venous Reflux:  None visualized. Other Findings:  None visualized. IMPRESSION: Deep venous thrombosis is seen within the right subclavian, axillary and basilic veins.  Electronically Signed   By: Marijo Conception M.D.   On: 05/03/2020 15:17   DG Chest Port 1 View  Result Date: 05/03/2020 CLINICAL DATA:  PICC placement EXAM: PORTABLE CHEST 1 VIEW COMPARISON:  04/21/2020 FINDINGS: Right arm PICC tip at the cavoatrial junction. Progression of diffuse bilateral airspace disease. Progression of left lower lobe  collapse and left effusion. Small right effusion. IMPRESSION: PICC tip at the cavoatrial junction Worsening bilateral airspace disease consistent with pneumonia. Electronically Signed   By: Franchot Gallo M.D.   On: 05/03/2020 13:49        Scheduled Meds: . amiodarone  200 mg Oral Daily  . vitamin C  250 mg Oral BID  . aspirin EC  81 mg Oral Daily  . calcium-vitamin D  2 tablet Oral Q breakfast  . Chlorhexidine Gluconate Cloth  6 each Topical Daily  . cholecalciferol  1,000 Units Oral Daily  . citalopram  10 mg Oral Daily  . docusate sodium  100 mg Oral BID  . enoxaparin (LOVENOX) injection  1 mg/kg Subcutaneous Q12H  . feeding supplement  237 mL Oral TID BM  . ferrous [id number] C-folic acid  1 capsule Oral BID PC  . hydroxychloroquine  400 mg Oral Daily  . ipratropium  0.5 mg Nebulization Q8H  . latanoprost  1 drop Both Eyes QHS  . levalbuterol  0.63 mg Nebulization Q8H  . megestrol  400 mg Oral Daily  . mirtazapine  15 mg Oral QHS  . multivitamin-lutein  1 capsule Oral Daily  . oxyCODONE  10 mg Oral Q12H  . potassium chloride  20 mEq Oral Daily  . potassium chloride  40 mEq Oral Once  . pravastatin  40 mg Oral QHS  . predniSONE  2.5 mg Oral Q breakfast  . sodium chloride flush  10-40 mL Intracatheter Q12H   Continuous Infusions: . piperacillin-tazobactam (ZOSYN)  IV 3.375 g (05/05/20 0143)     LOS: 8 days    Time spent: 30 mins     Wyvonnia Dusky, MD [facility] Pager [phone]  If 7PM-7AM, please contact night-coverage [email or web address] 05/05/2020, 7:52 AM

## 2020-05-05 NOTE — Progress Notes (Signed)
   05/05/20 0153  Assess: MEWS Score  Temp 97.8 F (36.6 C)  BP (!) 97/58  Pulse Rate 89  Resp (!) 23  Level of Consciousness Alert  SpO2 100 %  O2 Device Room Air  O2 Flow Rate (L/min) 1 L/min  Assess: MEWS Score  MEWS Temp 0  MEWS Systolic 1  MEWS Pulse 0  MEWS RR 1  MEWS LOC 0  MEWS Score 2  MEWS Score Color Yellow  Assess: if the MEWS score is Yellow or Red  Were vital signs taken at a resting state? Yes  Focused Assessment Change from prior assessment (see assessment flowsheet)  Early Detection of Sepsis Score *See Row Information* Low  MEWS guidelines implemented *See Row Information* Yes  Treat  MEWS Interventions Escalated (See documentation below)  Take Vital Signs  Increase Vital Sign Frequency  Yellow: Q 2hr X 2 then Q 4hr X 2, if remains yellow, continue Q 4hrs  Escalate  MEWS: Escalate Yellow: discuss with charge nurse/RN and consider discussing with provider and RRT  Notify: Charge Nurse/RN  Name of Charge Nurse/RN Notified [place]  Date Charge Nurse/RN Notified 05/05/20  Time Charge Nurse/RN Notified 0230  Notify: Provider  Provider Name/Title Lang Snow  Date Provider Notified 05/05/20  Time Provider Notified 0225  Notification Type Page (secure chat)  Notification Reason Change in status  Response  (awaiting response)

## 2020-05-05 NOTE — Consult Note (Signed)
[place] Nurse wound follow up Wound type:Stage 4 Pressure injury Measurement:8 x 13 x 1cm Wound bed: Pale pink, with white.yellow fibrinous material (slough) in wound bed. Bone palpable. Drainage (amount, consistency, odor) small amount serous Periwound: intact, dry. Small area of breakdown in the the periwound at 6 o'clock. Dressing procedure/placement/frequency: Patient is premedicated prior to my arrival. Two pieces of black foam removed, wound cleansed and 1 piece of black foam used to obliterate dead space of PI.  Ostomy skin barrier ring strip used to line periwound tissue from 4-7 o'clock. Covered with drape.  Skin to right hip protected with drape. One piece of black foam bridge used to bridge to right hip and covered with drape (total of two black foam dressings). Dressing attached to 155mmHg continuous negative pressure via T.R.A.C. pad. An immediate seal is achieved.   Next scheduled dressing change is Friday, 05/07/20.  [place] nursing team will follow, and will remain available to this patient, the nursing and medical teams.   Thanks, Maudie Flakes, MSN, RN, [place], Arther Abbott  Pager# [phone]

## 2020-05-05 NOTE — Plan of Care (Signed)
Patient has tolerated her pain with the help of PRN IV med.  Slept good. No BM . Had some nausea at the beginning of the shift and  subsided with PRN med. Patient has intermittent confusion as baseline.

## 2020-05-05 NOTE — Plan of Care (Addendum)
Patient continues to have poor appetite and difficulty with bed mobility. Pain medication given to assist with wound vac change. Patient having dysphagia with oral medications. Patient placed NPO pending SLP evaluation.   Fuller Mandril, RN

## 2020-05-05 NOTE — Progress Notes (Signed)
Daily Progress Note   Patient Name: Tina Salinas       Date: 05/05/2020 DOB: [birth year]  Age: 74 y.o. MRN#: [id number] Attending Physician: Tina Dusky, MD Primary Care Physician: Tina Sons, MD Admit Date: 04/21/2020  Reason for Consultation/Follow-up: Non pain symptom management and Pain control  Subjective: Chart reviewed and updates received by RN.  Patient is sleeping. Requiring much effort to arouse. Does not appear in any acute distress. Patient awakens and is alert to her self only. States the year is 75 and the month is November. Complains of pain. Nurse reports pain is much more controlled and she is able to tolerate dressing, wound vac change today much better today than previous days with IV pain medication. Continues to have episodes of vomiting with oral intake and/or pills. Pending SLP evaluation. Diet remains poor.   I explain to patient that I am worried she continues to decline. Patient states "don't worry. I am ok. Just make sure I am not in pain!"   3149: Spoke with Tina Salinas (APS worker). I provided her with updates on patient. She verbalized understanding. Tina Salinas states DSS is unable to make final decisions for patient as they have not completed legal guardianship for patient. Tina Salinas speaks with her supervisor and reports they are in agreement with her being comfortable knowing patient has mentioned being tired and suffering previously in conversations. Tina Salinas is planning to reach out to patient's friend Tina Salinas to see if she has any knowledge of pre-burial arrangements. She has also offered to come in and support patient if decided to transition to full comfort/EOL to allow her not to be alone.   1530: Updates from RN. Assessed patient at the bedside to compare condition to earlier morning observations. Patient is more alert however, remains confused. Able to state name. Year 1442, month April, she is unable to identify location. She states her husband is  deceased however also states that he is sitting in the room with her. Patient able to appropriately acknowledge that her friend Tina Salinas has just left from visiting. Tina Salinas expresses her feelings of being upset asking "who called her". Advised friend came to visit and no medical staff had called her. She reports she does not want anyone in her business and they have no right to know what she has going on. She again states "the only time you call her is when I am dead! I attempt to ask patient her wishes again regarding comfort and hospice. She states "whatever I just don't want to be in pain or miserable!" She acknowledges she is much more comfortable today. Patient's wishes continue to remain consistent and align with comfort measures. She declines hospice home referral.   In the setting patient has intermittent confusion, I discussed case with my Market researcher, Dr. Rhea Salinas. Patient has been clear she has no-one she would want to make medical decisions. She has no close family that the medical team or her [place] worker has knowledge of other than patient's living mother who is in no condition to make decisions for patient. APS also acknowledges they are unsure her whereabouts or name but do know she is in a facility somewhere with dementia according to previous discussions with patient and friend. Although patient has declined hospice home placement in the past, her request is to be kept comfortable, pain free, and elimination of suffering. Her prognosis is poor, no meaningful recovery expected, no reversible medical conditions, and appear to continue to decline. Patient  is much appropriate to transition to full comfort measures as she requested. Per discussions with Medical Director and team, will re-evaluate tomorrow for continued confusion or not. If patient continues to exhibit confusion she will not be able to make appropriate decisions at which point the medical team would need to decide next steps  for her with understanding of previous wishes. May consider consulting with Ethics team as well for additional support.     Length of Stay: 8 days  Vital Signs: BP (!) 95/58 (BP Location: Left Arm)   Pulse 77   Temp 98.3 F (36.8 C)   Resp 14   Ht 5' (1.524 m)   Wt 43 kg   SpO2 99%   BMI 18.51 kg/m  SpO2: SpO2: 99 % O2 Device: O2 Device: Nasal Cannula O2 Flow Rate: O2 Flow Rate (L/min): 1 L/min  Physical Exam: NAD, frail, chronically ill-appearing            Normal breathing pattern, diminished bilaterally Intermittent confusion, pleasant, alert to self only Palliative Care Assessment & Plan   Code Status: DNR  Goals of Care/Recommendations: Patient continues to decline hospice care at a facility.  Also continues to decline to allow myself or the medical team the opportunity to communicate with her close friend Tina Salinas. Patient continues to express she wishes the focus of her care to be on pain relief/elimination of suffering which I agree with and would highly recommend patient to consider for comfort measures.  Patient is aware of her poor prognosis, however today she is very much confused and not able to make appropriate decisions. Long discussion with APS who acknowledges patient's decline, felt that she is tired and dying, however are not considered her legal guardian and cannot make any final decisions for patient. Tina Salinas (Herbalist) states they support the medical team's decision keeping patient's best interest in mind. They will offer support to the patient if comfort/EOL is decided.  Patient is not appropriate for SNF rehab as she is not at the state that she can participate in therapies. This would only add prolonged suffering with no meaningful recovery.  RN reports patient is tolerating care/wound care much better with IV pain medications. Will continue as patient requested to focus on her pain and comfort with awareness of risk.  Will plan to re-evaluate patient's mental  status on tomorrow. If she continues to exhibit confusion medical team will need to decide to continue to treat aggressively with an end goal or plan vs. Proceeding with comfort care measures.  PMT will continue to support and follow  Prognosis: Poor  Discharge Planning: To Be Determined  Thank you for allowing the Palliative Medicine Team to assist in the care of this patient.  Time Total: 65 min.   Visit consisted of counseling and education dealing with the complex and emotionally intense issues of symptom management and palliative care in the setting of serious and potentially life-threatening illness.Greater than 50%  of this time was spent counseling and coordinating care related to the above assessment and plan.  Alda Lea, AGPCNP-BC  Palliative Medicine Team [phone]

## 2020-05-06 LAB — CBC
HCT: 25.3 % — ABNORMAL LOW (ref 36.0–46.0)
Hemoglobin: 7.5 g/dL — ABNORMAL LOW (ref 12.0–15.0)
MCH: 29.2 pg (ref 26.0–34.0)
MCHC: 29.6 g/dL — ABNORMAL LOW (ref 30.0–36.0)
MCV: 98.4 fL (ref 80.0–100.0)
Platelets: 370 10*3/uL (ref 150–400)
RBC: 2.57 MIL/uL — ABNORMAL LOW (ref 3.87–5.11)
RDW: 25.1 % — ABNORMAL HIGH (ref 11.5–15.5)
WBC: 8.2 10*3/uL (ref 4.0–10.5)
nRBC: 2.6 % — ABNORMAL HIGH (ref 0.0–0.2)

## 2020-05-06 LAB — BASIC METABOLIC PANEL
Anion gap: 13 (ref 5–15)
BUN: 18 mg/dL (ref 8–23)
CO2: 19 mmol/L — ABNORMAL LOW (ref 22–32)
Calcium: 7.4 mg/dL — ABNORMAL LOW (ref 8.9–10.3)
Chloride: 112 mmol/L — ABNORMAL HIGH (ref 98–111)
Creatinine, Ser: 0.89 mg/dL (ref 0.44–1.00)
GFR, Estimated: >60 mL/min (ref >60)
Glucose, Bld: 54 mg/dL — ABNORMAL LOW (ref 70–99)
Potassium: 3.7 mmol/L (ref 3.5–5.1)
Sodium: 144 mmol/L (ref 135–145)

## 2020-05-06 LAB — GLUCOSE, CAPILLARY
Glucose-Capillary: 183 mg/dL — ABNORMAL HIGH (ref 70–99)
Glucose-Capillary: 44 mg/dL — CL (ref 70–99)

## 2020-05-06 MED ORDER — OXYCODONE HCL 5 MG/5ML PO SOLN
5.0000 mg | Freq: Four times a day (QID) | ORAL | Status: DC | PRN
  Administered 2020-05-06 – 2020-05-07 (×2): 5 mg via ORAL
  Filled 2020-05-06 (×2): qty 5

## 2020-05-06 MED ORDER — LEVALBUTEROL HCL 0.63 MG/3ML IN NEBU
0.6300 mg | INHALATION_SOLUTION | Freq: Four times a day (QID) | RESPIRATORY_TRACT | Status: DC | PRN
  Filled 2020-05-06: qty 3

## 2020-05-06 MED ORDER — DEXTROSE 50 % IV SOLN
INTRAVENOUS | Status: AC
  Filled 2020-05-06: qty 50

## 2020-05-06 MED ORDER — IPRATROPIUM BROMIDE 0.02 % IN SOLN: 0.5000 mg | Freq: Four times a day (QID) | RESPIRATORY_TRACT | Status: DC | PRN

## 2020-05-06 MED ORDER — DEXTROSE 50 % IV SOLN
1.0000 | Freq: Once | INTRAVENOUS | Status: AC
  Administered 2020-05-06: 50 mL via INTRAVENOUS

## 2020-05-06 NOTE — Progress Notes (Signed)
Nutrition Follow Up Note   DOCUMENTATION CODES:   Severe malnutrition in context of chronic illness  INTERVENTION:   Ensure Enlive po TID, each supplement provides 350 kcal and 20 grams of protein  Vital Cuisine TID, each supplement provides 520kcal and 22g of protein.   Magic cup TID with meals, each supplement provides 290 kcal and 9 grams of protein  Ocuvite daily for wound healing (provides zinc, vitamin A, vitamin C, Vitamin E, copper, and selenium)  Vitamin C 230m po BID  NUTRITION DIAGNOSIS:   Severe Malnutrition related to  (cerebral palsy, CHF, chronic wounds) as evidenced by severe fat depletion, severe muscle depletion.  GOAL:   Patient will meet greater than or equal to 90% of their needs  -not met   MONITOR:   PO intake, Supplement acceptance, Labs, Weight trends, Skin, I & O's  ASSESSMENT:   74y.o. female with medical history significant for CAD s/p stent angioplasty, CHF with preserved LVEF, peripheral vascular disease, rheumatoid arthritis, stage III CKD, atrial fibrillation, NSTEMI, HTN, bedbound and cerebral palsy who is admitted with sepsis, UTI and sacral wound   Pt s/p I & D with VAC 10/13  Pt continues to have poor appetite and oral intake in hospital; pt eating <25% of meals and is refusing most of her supplements. No BM noted since 10/12; MD notified. Ideally, pt would likely need PEG tube placement and nutrition support to be able to meet her estimated needs and support wound healing. Pt would not likely want tube placement as she has refused nutritional interventions in the past. Palliative care following; RD will monitor for GOC.   Medications reviewed and include: vitamin C, aspirin, oscal w/ D, D3, celexa, colace, lovenox, ferrous fumerate, BI50 folic acid, megace, remeron, ocuvite, miralax, KCl, prednisone, unasyn  Labs reviewed: Hgb 7.5(L), Hct 25.3(L)  Diet Order:   Diet Order            DIET - DYS 1 Room service appropriate? Yes; Fluid  consistency: Thin  Diet effective now                EDUCATION NEEDS:   No education needs have been identified at this time  Skin:  Skin Assessment: Reviewed RN Assessment (Stage IV sacrum 8 x 13 x 1cm)  Last BM:  10/12  Height:   Ht Readings from Last 1 Encounters:  04/28/20 5' (1.524 m)    Weight:   Wt Readings from Last 1 Encounters:  04/28/20 43 kg    Ideal Body Weight:  45.4 kg  BMI:  Body mass index is 18.51 kg/m.  Estimated Nutritional Needs:   Kcal:  1300-1500kcal/day  Protein:  65-75g/day  Fluid:  1-1.2L/day  CKoleen DistanceMS, RD, LDN Please refer to [facility]for RD and/or RD on-call/weekend/after hours pager

## 2020-05-06 NOTE — Progress Notes (Addendum)
   05/06/20 1150  Clinical Encounter Type  Visited With Patient  Visit Type Initial  Referral From Nurse  Consult/Referral To Chaplain  Chaplain responded to an OR, When chaplain arrive at the room, nurse was brushing Pt's teeth, therefore chaplain stood at the door and nurse told her to come in. Before leaving nurse gave Pt some water. After nurse left Pt asked chaplain to give her more water and chaplain did. Pt continuously thanked chaplain for coming to see her. Pt talked about trying to leave her room. She seems confused. 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 Pt said when she doesn't feed them, they come to her window and peck on the window. She said once she over slept and got up for work because the birds woke her up. Pt asked chaplain to turn the television on and when chaplain turned it on, Pt said no, that's death music. Chaplain repeated Death music. So chaplain came turning until Pt told chaplain to stop. She said she wanted to watch an action show. PT asked chaplain several times to give her water and chaplain assisted her with her water. Pt also asked for a blanket, chaplain went to nurse's station and brought back a warm blanket. Before leaving chaplain prayed with Pt. Pt asked chaplain to come back and see her and chaplain told he that she would.

## 2020-05-06 NOTE — Evaluation (Signed)
Clinical/Bedside Swallow Evaluation Patient Details  Name: Tina Salinas MRN: [id number] Date of Birth: 02/18/1946  Today's Date: 05/06/2020 Time: SLP Start Time (ACUTE ONLY): 0900 SLP Stop Time (ACUTE ONLY): 0924 SLP Time Calculation (min) (ACUTE ONLY): 24 min  Past Medical History:  Past Medical History:  Diagnosis Date  . Adenoma   . CAP (community acquired pneumonia) 11/16/2017  . Cerebral palsy ([place])   . CHF (congestive heart failure) ([place])   . Femur fracture ([place])   . History of blood transfusion   . Hypertension   . Osteoarthritis   . Osteoporosis   . Rheumatoid arthritis [facility])    Past Surgical History:  Past Surgical History:  Procedure Laterality Date  . BREAST ENHANCEMENT SURGERY Bilateral 1980  . COLONOSCOPY    . COLONOSCOPY WITH PROPOFOL N/A 06/18/2019   Procedure: COLONOSCOPY WITH PROPOFOL;  Surgeon: Robert Bellow, MD;  Location: [facility] ENDOSCOPY;  Service: Endoscopy;  Laterality: N/A;  . CORONARY ANGIOPLASTY WITH STENT PLACEMENT  07/13/2008, 08/19/2008  . ESOPHAGOGASTRODUODENOSCOPY (EGD) WITH PROPOFOL N/A 06/18/2019   Procedure: ESOPHAGOGASTRODUODENOSCOPY (EGD) WITH PROPOFOL;  Surgeon: Robert Bellow, MD;  Location: [facility] ENDOSCOPY;  Service: Endoscopy;  Laterality: N/A;  . HIP ARTHROPLASTY N/A 2000  . INCISION AND DRAINAGE ABSCESS N/A 04/28/2020   Procedure: INCISION AND DRAINAGE ABSCESS;  Surgeon: Olean Ree, MD;  Location: [facility] ORS;  Service: General;  Laterality: N/A;  . JOINT REPLACEMENT    . LEFT HEART CATH AND CORONARY ANGIOGRAPHY N/A 05/16/2017   Procedure: LEFT HEART CATH AND CORONARY ANGIOGRAPHY;  Surgeon: Teodoro Spray, MD;  Location: [place] CV LAB;  Service: Cardiovascular;  Laterality: N/A;  . LEFT HEART CATH AND CORONARY ANGIOGRAPHY N/A 04/01/2020   Procedure: LEFT HEART CATH AND CORONARY ANGIOGRAPHY;  Surgeon: Isaias Cowman, MD;  Location: [place] CV LAB;  Service: Cardiovascular;  Laterality: N/A;  . TOTAL KNEE ARTHROPLASTY  Left 09/2007, 01/2008   HPI:  Pt is 74 y/o F with PMH: cerebral palsy and combined hert failure. Pt presented to ER secondary to progressive SOB, chest pain; admitted for management of NSTEMI. Sacral wound addressed in OR, wound vac placed.   Assessment / Plan / Recommendation Clinical Impression  Pt is at an extremely high risk of aspiration in the setting of severe deconditioning, bed bound and significantly altered mental status. As such she is not able to follow any directions and she is very difficult to redirect to tasks at hand d/t perseverative talking about inaccurate information. During this session pt was perseverative on a "2 hour long test" and "going home afterwards." Despite redirection to food in her mouth and needing to swallow, pt continued talking about "going to [facility] for the test." Clinically, pt continued thin liquids via straw and cup without any overt s/s of aspiration or dysphagia. In fact she consumed multiple sips via straw with no adverse effects at bedside. Pt's oral phase is greatly impacted by her AMS. She frequently talks while having puree and graham which results in oral residue, multiple swallows and delayed throat clearing with solid graham cracker. At this time, would recommend dysphagia 1 diet with thin liquids, medicines crushed in puree and STRICT ASPIRATION PRECAUTIONS. Unfortunately ST intervention is not able to reduce pt's aspiration risk. Pt stated that she "didn't care" what she eats and states that if thirsty she "wants water."   SLP Visit Diagnosis: Dysphagia, oropharyngeal phase (R13.12)    Aspiration Risk  Severe aspiration risk;Risk for inadequate nutrition/hydration    Diet Recommendation Thin  liquid;Dysphagia 1 (Puree)   Liquid Administration via: Cup;Straw Medication Administration: Crushed with puree Supervision: Full supervision/cueing for compensatory strategies;Staff to assist with self feeding Compensations: Minimize environmental  distractions;Slow rate;Small sips/bites Postural Changes: Seated upright at 90 degrees;Remain upright for at least 30 minutes after po intake    Other  Recommendations Oral Care Recommendations: Oral care BID   Follow up Recommendations  (Palliative Care and Hospice Services)      Frequency and Duration            Prognosis        Swallow Study   General Date of Onset: 05/05/20 HPI: Pt is 74 y/o F with PMH: cerebral palsy and combined hert failure. Pt presented to ER secondary to progressive SOB, chest pain; admitted for management of NSTEMI. Sacral wound addressed in OR, wound vac placed. Type of Study: Bedside Swallow Evaluation Previous Swallow Assessment: none in chart Diet Prior to this Study: NPO Temperature Spikes Noted: No Respiratory Status: Nasal cannula (1 liter) History of Recent Intubation: No Behavior/Cognition: Alert;Agitated;Confused;Distractible;Requires cueing;Doesn't follow directions Oral Cavity Assessment: Dried secretions Oral Care Completed by SLP: Yes Oral Cavity - Dentition: Adequate natural dentition Vision: Functional for self-feeding Self-Feeding Abilities: Total assist Patient Positioning: Upright in bed Baseline Vocal Quality: Normal Volitional Cough: Cognitively unable to elicit Volitional Swallow: Unable to elicit    Oral/Motor/Sensory Function Overall Oral Motor/Sensory Function: Within functional limits   Ice Chips Ice chips: Not tested   Thin Liquid Thin Liquid: Impaired Presentation: Cup;Straw Oral Phase Impairments: Reduced labial seal (with straw)    Nectar Thick Nectar Thick Liquid: Not tested   Honey Thick Honey Thick Liquid: Not tested   Puree Puree: Impaired Presentation: Spoon Oral Phase Impairments: Poor awareness of bolus (talking while bolus present in mouth) Oral Phase Functional Implications: Oral residue Pharyngeal Phase Impairments: Multiple swallows   Solid     Solid: Impaired Oral Phase Impairments: Reduced  lingual movement/coordination;Poor awareness of bolus;Impaired mastication Oral Phase Functional Implications: Oral residue Pharyngeal Phase Impairments: Multiple swallows;Throat Clearing - Immediate     Tivis Wherry B. Rutherford Nail M.S., CCC-SLP, [place] Office [phone]  Ilona Colley 05/06/2020,1:09 PM

## 2020-05-06 NOTE — Progress Notes (Addendum)
   05/06/20 1600  Clinical Encounter Type  Visited With Family  Visit Type Follow-up  Referral From Chaplain;Other (Comment)  Consult/Referral To Chaplain  Chaplain received call from switchboard saying lady wanted to know it chaplains would notarize a document. I told switchboard operator chaplains do not do notaries. She gave chaplain the lady's name and number asking chaplain to call her back. The lady's name is Tina Salinas and her number is [phone]. Chaplain called lady back, introducing herself. Tina Salinas said you are the lady I spoke with earlier and I said yes. Tina Salinas explained she has a new Will and needs to have it notarized. I told her I was not sure if that was something our notaries would notarized. Chaplain made several attempts to reach notaries with no success. As chaplain was charting she started feeling uneasy about this and walked to the unit to speak with the nurse. Nurse Butch Penny was with a pt, but told chaplain she would come out to speak with her as soon as she finished with the pt she was with. When nurse Butch Penny came out she explained to chaplain that she was uncomfortable with this because Ms. Sherard is confused. At time she doesn't know what year it is and she is calling blankets, jackets. I explained with Pt being confused we would not be able to complete an AD and notarizing a Will makes chaplain even more nervous.   Chaplain called Tina Salinas back explaining no one in [facility] would be willing to notarize this document. Chaplain also explained that she thought she was trying to get an attorney to come up. Tina Salinas said if I bring an attorney up, than can I do this. Chaplain said yes, because having an attorney involved takes legal ramifications off of the hospital and places them on the attorney. Tina Salinas said ok. I am in touch with an attorney that will not be back in his office until Monday. Chaplain said fine, if you get him to come and you have trouble getting him up, have chaplain paged.

## 2020-05-06 NOTE — Progress Notes (Addendum)
   05/06/20 1600  Clinical Encounter Type  Visited With Family  Visit Type Follow-up  Referral From Chaplain;Other (Comment)  Consult/Referral To Chaplain  Switchboard called saying a lady wanted to know if chaplain notarized documents. I told the switchboard that chaplains do not notarized, but she wanted me to take the lady's name and telephone number and I did. It was the lady [name] that I met earlier. Her number is [phone]. When I called back, she said I believe you are the lady I saw earlier and I said yes. She informed me that she has a new will and that she is the one things will go to. Per the lady from [employer], [name] is Pt's POA and is in charge of all her finances. [name] needs the new Will notarized. I told her that I was not sure if I could find someone to notarize the document. I called several people in the hospital, but no one answered. I will reach out to others in the morning to see if someone will notarize the will. Chaplain is uncomfortable with this because Pt is confused and nurse said she is uncomfortable with this because Pt is confused. Nurse said she will talk with chaplain after she is finished working with another patient. Nurse Butch Penny agreed Pt is confused, Chaplain will call [name] back and tell her this is not something our notaries will do.

## 2020-05-06 NOTE — Progress Notes (Signed)
Noted glucose of 54 on am labs, re-checked on unit with result of 44 at this time. Patient asymptomatic. Secure chat sent to Dr. [name].

## 2020-05-06 NOTE — Progress Notes (Signed)
PROGRESS NOTE    Tina Salinas  DTO:[id number] DOB: 09/14/1945 DOA: 04/21/2020 PCP: Birdie Sons, MD   Assessment & Plan:   Active Problems:   Adjustment disorder with depressed mood   Infantile cerebral palsy (HCC)   Chronic combined systolic and diastolic CHF (congestive heart failure) (HCC)   Malnutrition of moderate degree   Hypokalemia   Pressure injury of skin   Sepsis ([place])   Suspected sepsis: likely secondary to sacral decubitus. Sepsis resolved. Continue on IV zosyn. Continue on IVFs. Urine cx NGTD. Blood cxs NGTD  Lactic acidosis: resolved  Stage III sacral ulcer: s/p I&D on 04/28/20. Wound cx grew pseudomonas. Continue on IV zosyn, will need a total of 14 day course. Continue w/ wound care  RUE DVT: continue on full dose lovenox   Fungal cystitis: completed anti-fungal course   Hypokalemia: within normal limits. Will continue to monitor  Dysphagia: started on dysphagia diet as per speech   Diarrhea: GI PCR panel is neg. Imodium prn   Acute on chronic systolic CHF: s/p IV lasix. Monitor I/Os. Echo showed EF <20%.   CAD: cardiac cath done on 9/21 showed nonobstructive coronary artery disease.Continue on aspirin.  PAF: continue on amiodarone. Continue to hold BB   HTN: continue on amiodarone. Continue on hold BB   Hx of rheumatoid arthritis: continue on prednisone, methotrexate, hydroxychloroquine  Adjustment disorder with depressed mood: continue on celexa.   Hyperlipidemia: continue on statin   Infantile cerebral palsy: bedridden. Continue w/ supportive care    Severe malnutrition: BMI 18.51. Continue w/ nutritional supplements   Failure to thrive: secondary to all above. Palliative care following. Pt is considering comfort care but is intermittently confused   Thrombocytosis: resolved  ACD: no need for a transfusion at this. Will continue to monitor    DVT prophylaxis: lovenox Code Status: DNR Family Communication:  Disposition  Plan: likely d/c to SNF   Status is: Inpatient  Remains inpatient appropriate because:Unsafe d/c plan and IV treatments appropriate due to intensity of illness or inability to take PO, CM is working on d/c placement at long term nursing home vs comfort care    Dispo: The patient is from: Home              Anticipated d/c is to: SNF vs long term nursing home               Anticipated d/c date is: > 3 days              Patient currently is not medically stable to d/c.    Consultants:   General surg   Chaplain    Procedures:    Antimicrobials: zosyn    Subjective: Pt is oriented to person, place  Objective: Vitals:   05/05/20 2116 05/06/20 0000 05/06/20 0017 05/06/20 0421  BP:  105/61 105/61 95/60  Pulse:   69 67  Resp:  16 16 16   Temp:  97.8 F (36.6 C) 97.8 F (36.6 C) 98.1 F (36.7 C)  TempSrc:  Oral Oral   SpO2: 99% 100% 100% 100%  Weight:      Height:        Intake/Output Summary (Last 24 hours) at 05/06/2020 0741 Last data filed at 05/06/2020 0600 Gross per 24 hour  Intake 243.45 ml  Output 450 ml  Net -206.55 ml   Filed Weights   04/21/20 1238 04/28/20 1251  Weight: 43 kg 43 kg    Examination:  General exam: Appears agitated. Appears  older than stated age  Respiratory system: diminished breath sounds b/l  Cardiovascular system: S1&S2+. No rubs or gallops Gastrointestinal system: Abdomen is nondistended, soft and nontender.  Hyperactive bowel sounds heard. Central nervous system: Oriented to person & place. Moves all extremities  Psychiatry: Judgement and insight appear abnormal. Appears agitated and frustrated    Data Reviewed: I have personally reviewed following labs and imaging studies  CBC: Recent Labs  Lab 05/01/20 0317 05/02/20 0448 05/03/20 0327 05/05/20 0510 05/06/20 0542  WBC 11.5* 12.5* 11.6* 10.2 8.2  HGB 8.8* 8.2* 8.2* 7.5* 7.5*  HCT 30.8* 27.5* 28.1* 25.8* 25.3*  MCV 98.1 96.2 96.9 97.7 98.4  PLT 394 416* 461* 516*  784   Basic Metabolic Panel: Recent Labs  Lab 05/01/20 0317 05/02/20 0448 05/03/20 0327 05/05/20 0510 05/06/20 0542  NA 144 141 141 141 144  K 4.6 4.5 4.6 4.4 3.7  CL 119* 115* 112* 109 112*  CO2 19* 20* 24 22 19*  GLUCOSE 105* 125* 143* 90 54*  BUN 14 14 17 22 18   CREATININE 0.80 0.83 0.89 1.06* 0.89  CALCIUM 7.3* 7.5* 7.7* 7.8* 7.4*   GFR: Estimated Creatinine Clearance: 38.8 mL/min (by C-G formula based on SCr of 0.89 mg/dL). Liver Function Tests: No results for input(s): AST, ALT, ALKPHOS, BILITOT, PROT, ALBUMIN in the last 168 hours. No results for input(s): LIPASE, AMYLASE in the last 168 hours. No results for input(s): AMMONIA in the last 168 hours. Coagulation Profile: No results for input(s): INR, PROTIME in the last 168 hours. Cardiac Enzymes: No results for input(s): CKTOTAL, CKMB, CKMBINDEX, TROPONINI in the last 168 hours. BNP (last 3 results) No results for input(s): PROBNP in the last 8760 hours. HbA1C: No results for input(s): HGBA1C in the last 72 hours. CBG: Recent Labs  Lab 05/06/20 0733  GLUCAP 44*   Lipid Profile: No results for input(s): CHOL, HDL, LDLCALC, TRIG, CHOLHDL, LDLDIRECT in the last 72 hours. Thyroid Function Tests: No results for input(s): TSH, T4TOTAL, FREET4, T3FREE, THYROIDAB in the last 72 hours. Anemia Panel: No results for input(s): VITAMINB12, FOLATE, FERRITIN, TIBC, IRON, RETICCTPCT in the last 72 hours. Sepsis Labs: No results for input(s): PROCALCITON, LATICACIDVEN in the last 168 hours.  Recent Results (from the past 240 hour(s))  Urine culture     Status: Abnormal   Collection Time: 04/26/20  2:58 PM   Specimen: Urine, Random  Result Value Ref Range Status   Specimen Description   Final    URINE, RANDOM Performed at [facility], [address] [place], [place] [address]    Special Requests   Final    NONE Performed at [facility], [address] [place], [place] [address]    Culture  MULTIPLE SPECIES PRESENT, SUGGEST RECOLLECTION (A)  Final   Report Status 04/27/2020 FINAL  Final  Gastrointestinal Panel by PCR , Stool     Status: None   Collection Time: 04/26/20  2:58 PM   Specimen: Stool  Result Value Ref Range Status   Campylobacter species NOT DETECTED NOT DETECTED Final   Plesimonas shigelloides NOT DETECTED NOT DETECTED Final   Salmonella species NOT DETECTED NOT DETECTED Final   Yersinia enterocolitica NOT DETECTED NOT DETECTED Final   Vibrio species NOT DETECTED NOT DETECTED Final   Vibrio cholerae NOT DETECTED NOT DETECTED Final   Enteroaggregative E coli (EAEC) NOT DETECTED NOT DETECTED Final   Enteropathogenic E coli (EPEC) NOT DETECTED NOT DETECTED Final   Enterotoxigenic E coli (ETEC) NOT DETECTED  NOT DETECTED Final   Shiga like toxin producing E coli (STEC) NOT DETECTED NOT DETECTED Final   Shigella/Enteroinvasive E coli (EIEC) NOT DETECTED NOT DETECTED Final   Cryptosporidium NOT DETECTED NOT DETECTED Final   Cyclospora cayetanensis NOT DETECTED NOT DETECTED Final   Entamoeba histolytica NOT DETECTED NOT DETECTED Final   Giardia lamblia NOT DETECTED NOT DETECTED Final   Adenovirus F40/41 NOT DETECTED NOT DETECTED Final   Astrovirus NOT DETECTED NOT DETECTED Final   Norovirus GI/GII NOT DETECTED NOT DETECTED Final   Rotavirus A NOT DETECTED NOT DETECTED Final   Sapovirus (I, II, IV, and V) NOT DETECTED NOT DETECTED Final    Comment: Performed at [facility], [address], [place], [place] [address]  Respiratory Panel by RT PCR (Flu A&B, Covid) - Nasopharyngeal Swab     Status: None   Collection Time: 04/26/20  3:51 PM   Specimen: Nasopharyngeal Swab  Result Value Ref Range Status   SARS Coronavirus 2 by RT PCR NEGATIVE NEGATIVE Final    Comment: (NOTE) SARS-CoV-2 target nucleic acids are NOT DETECTED.  The SARS-CoV-2 RNA is generally detectable in upper respiratoy specimens during the acute phase of infection. The  lowest concentration of SARS-CoV-2 viral copies this assay can detect is 131 copies/mL. A negative result does not preclude SARS-Cov-2 infection and should not be used as the sole basis for treatment or other patient management decisions. A negative result may occur with  improper specimen collection/handling, submission of specimen other than nasopharyngeal swab, presence of viral mutation(s) within the areas targeted by this assay, and inadequate number of viral copies (<131 copies/mL). A negative result must be combined with clinical observations, patient history, and epidemiological information. The expected result is Negative.  Fact Sheet for Patients:  PinkCheek.be  Fact Sheet for Healthcare Providers:  GravelBags.it  This test is no t yet approved or cleared by the [place] FDA and  has been authorized for detection and/or diagnosis of SARS-CoV-2 by FDA under an Emergency Use Authorization (EUA). This EUA will remain  in effect (meaning this test can be used) for the duration of the COVID-19 declaration under Section 564(b)(1) of the Act, 21 U.S.C. section 360bbb-3(b)(1), unless the authorization is terminated or revoked sooner.     Influenza A by PCR NEGATIVE NEGATIVE Final   Influenza B by PCR NEGATIVE NEGATIVE Final    Comment: (NOTE) The Xpert Xpress SARS-CoV-2/FLU/RSV assay is intended as an aid in  the diagnosis of influenza from Nasopharyngeal swab specimens and  should not be used as a sole basis for treatment. Nasal washings and  aspirates are unacceptable for Xpert Xpress SARS-CoV-2/FLU/RSV  testing.  Fact Sheet for Patients: PinkCheek.be  Fact Sheet for Healthcare Providers: GravelBags.it  This test is not yet approved or cleared by the [place] FDA and  has been authorized for detection and/or diagnosis of SARS-CoV-2 by  FDA under  an Emergency Use Authorization (EUA). This EUA will remain  in effect (meaning this test can be used) for the duration of the  Covid-19 declaration under Section 564(b)(1) of the Act, 21  U.S.C. section 360bbb-3(b)(1), unless the authorization is  terminated or revoked. Performed at [facility], [address], [place],  [address]   Aerobic/Anaerobic Culture (surgical/deep wound)     Status: None   Collection Time: 04/28/20  2:10 PM   Specimen: Wound; Tissue  Result Value Ref Range Status   Specimen Description   Final    WOUND Performed at [facility], [address], [place], [place] [address]    Special Requests   Final    TISSUE FROM SACRAL DECUBITIS WOUND Performed at [facility], [address] [place], [place] [address]    Gram Stain   Final    ABUNDANT WBC PRESENT, PREDOMINANTLY PMN FEW GRAM NEGATIVE RODS    Culture   Final    FEW PSEUDOMONAS AERUGINOSA FEW ENTEROCOCCUS FAECALIS FEW BACTEROIDES SPECIES BETA LACTAMASE POSITIVE Performed at [facility], [place] [address] [place], [place] [address]    Report Status 05/02/2020 FINAL  Final   Organism ID, Bacteria PSEUDOMONAS AERUGINOSA  Final   Organism ID, Bacteria ENTEROCOCCUS FAECALIS  Final      Susceptibility   Enterococcus faecalis - MIC*    AMPICILLIN <=2 SENSITIVE Sensitive     VANCOMYCIN 2 SENSITIVE Sensitive     GENTAMICIN SYNERGY SENSITIVE Sensitive     * FEW ENTEROCOCCUS FAECALIS   Pseudomonas aeruginosa - MIC*    CEFTAZIDIME 4 SENSITIVE Sensitive     CIPROFLOXACIN <=0.25 SENSITIVE Sensitive     GENTAMICIN <=1 SENSITIVE Sensitive     IMIPENEM 2 SENSITIVE Sensitive     PIP/TAZO 16 SENSITIVE Sensitive     CEFEPIME 2 SENSITIVE Sensitive     * FEW PSEUDOMONAS AERUGINOSA  Urine Culture     Status: Abnormal   Collection Time: 04/29/20  4:30 AM   Specimen: Urine, Random  Result Value Ref Range Status   Specimen Description   Final    URINE, RANDOM Performed  at [facility], [address] [place], [place] [address]    Special Requests   Final    NONE Performed at [facility], [address] [place], [place] [address]    Culture 40,000 COLONIES/mL YEAST (A)  Final   Report Status 04/30/2020 FINAL  Final         Radiology Studies: No results found.      Scheduled Meds: . amiodarone  200 mg Oral Daily  . vitamin C  250 mg Oral BID  . aspirin EC  81 mg Oral Daily  . calcium-vitamin D  2 tablet Oral Q breakfast  . Chlorhexidine Gluconate Cloth  6 each Topical Daily  . cholecalciferol  1,000 Units Oral Daily  . citalopram  10 mg Oral Daily  . dextrose  1 ampule Intravenous Once  . docusate sodium  100 mg Oral BID  . enoxaparin (LOVENOX) injection  1 mg/kg Subcutaneous Q12H  . feeding supplement  237 mL Oral TID BM  . ferrous [id number] C-folic acid  1 capsule Oral BID PC  . hydroxychloroquine  400 mg Oral Daily  . latanoprost  1 drop Both Eyes QHS  . megestrol  400 mg Oral Daily  . mirtazapine  15 mg Oral QHS  . multivitamin-lutein  1 capsule Oral Daily  . oxyCODONE  10 mg Oral Q12H  . polyethylene glycol  17 g Oral Daily  . potassium chloride  40 mEq Oral Once  . pravastatin  40 mg Oral QHS  . predniSONE  2.5 mg Oral Q breakfast  . sodium chloride flush  10-40 mL Intracatheter Q12H   Continuous Infusions: . sodium chloride 10 mL/hr at 05/06/20 0600  . piperacillin-tazobactam (ZOSYN)  IV Stopped (05/06/20 0357)     LOS: 9 days    Time spent: 32 mins     Wyvonnia Dusky, MD [facility] Pager [phone]  If 7PM-7AM, please contact night-coverage [email or web address] 05/06/2020, 7:41 AM

## 2020-05-06 NOTE — Progress Notes (Addendum)
Daily Progress Note   Patient Name: Tina Salinas       Date: 05/06/2020 DOB: October 11, 1945  Age: 74 y.o. MRN#: [id number] Attending Physician: Wyvonnia Dusky, MD Primary Care Physician: Birdie Sons, MD Admit Date: 04/21/2020  Reason for Consultation/Follow-up: Establishing goals of care, Non pain symptom management and Pain control  Subjective: Patient awake, appears to be watching tv. Alert to self only. Complains of pain. Able to share that she has refused care due to uncontrolled pain and the "fear of agony". She states she is not going to put herself through anything that will call her to feel any worst than what she does and it doesn't matter who likes it or not.   [name] speaks appropriately during most of my discussion with similar responses that she provided early on during her admission. Patient becomes tearful expressing "I don't know how much more suffering I can take!" She speaks to her prepaid burial plot and wanting to be buried next to her husband. She states she is miserable. Therapeutic listening and support provided.  [name] states her husband is buried at [facility] and this is where her prepaid lot is also. ( I was able to search and verify the location which is what she described).   I again provided education and recommendations to transition her care to focus on comfort/EOL care. Patient expresses she would like to be comfortable and is ok if she passes away. Stating "that would be a blessing!" I offered her the opportunity to be referred to residential hospice facility. She requested to discuss further. I explained the goals and philosophy of care for hospice. She verbalized understanding now stating she would like to think about going to the hospice home especially if they are going to focus on her pain and comfort.   She continues to decline for discussions to take place with her friend land or anyone else outside of her [place] workers.  Prior to  leaving patient's room a false CODE BLUE alarm with sit on the department.  Patient asking that someone die.  After investigating the false alarm I came back and followed up with her and to close our conversation.  I informed her that all was well and no one was in an emergent situation.  Patient states "I guess I was not lucky enough to die today".  I expressed to [name] my concerns that she continues to speak of comfort and end-of-life.  I provided support explaining the medical team wanted to do what is best for her and allow her comfort with a goal of minimizing/eliminating any perceived/observed suffering/discomfort.  Patient states "that is what I wrote".   Despite patient's continued verbalized wishes for comfort and elimination of suffering it is hard to determine the accuracy of her decisions given her intermittent confusion.  I do feel patient would most benefit from transitioning to full comfort and potentially hospice referral.  Unfortunately patient does not have a designated decision maker which makes transitioning her care to comfort a little more difficult.  APS has been updated and aware of patient's condition and challenges in care. [name] again expresses comfort would be in the best interest for Ms. [name] with emphasis they are not able to make such decisions for her.   Length of Stay: 9 days  Vital Signs: BP 109/64 (BP Location: Left Arm)   Pulse 73   Temp 98.2 F (36.8 C) (Oral)   Resp 16   Ht  5' (1.524 m)   Wt 43 kg   SpO2 100%   BMI 18.51 kg/m  SpO2: SpO2: 100 % O2 Device: O2 Device: Nasal Cannula O2 Flow Rate: O2 Flow Rate (L/min): 1 L/min  Physical Exam: Awake, alert to self, chronically-ill appearing Intermittent confusion, mood appropriate, will follow commands Diminished bilaterally             Palliative Care Assessment & Plan  Code Status: DNR  Goals of Care/Recommendations: Patient with intermittent confusion. Continues to clearly express wishes not  to suffer during since her admission. Continues to have pain/discomfort. Is suffering which she does not want. Unfortunately no decision maker. APS cannot make decisions.  Patient is appropriate to transition care to comfort. Discussed with Dr. Hilma Favors (Palliative Medical Director). [name] (APS) updated. Both Dr. Hilma Favors and APS agrees on comfort if medical team is all in agreement and given patient's expressed wishes. I have reached out to Amy, Therapist, sports for [employer] for further insight.  Patient's prognosis is poor. No meaningful recovery. Patient is not appropriate for SNF rehab as she is not at the state that she can participate in therapies. This would only add prolonged suffering with no meaningful recovery.  Spoke with Amy, RN Administrator, sports) she and Albin Felling will review chart and provide any further recommendations to assist with patient's care/decisions.  PMT will continue to support. I will be off-service after today. No weekend coverage. If no final decisions will follow-up on Monday.  Continue PRN medications with a goal of comfort/pain relief.   Prognosis: POOR  Discharge Planning: To Be Determined  Thank you for allowing the Palliative Medicine Team to assist in the care of this patient.  Time Total: 45 min.   Visit consisted of counseling and education dealing with the complex and emotionally intense issues of symptom management and palliative care in the setting of serious and potentially life-threatening illness.Greater than 50%  of this time was spent counseling and coordinating care related to the above assessment and plan.  Alda Lea, AGPCNP-BC  Palliative Medicine Team [phone]

## 2020-05-06 NOTE — Ethics Note (Signed)
Alda Lea, Palliative Care NP, contacted ethics committee regarding this patient transitioning to comfort care. The patient has no family to speak for her in the event she is not capable to speak for herself. Patient stated, per JWWZL'Y note, the medical team is asked to make those final decisions for her if she is not able to do so herself.   As stated in [place] notes, goals of care was established with patient and patient stated her quality of life is poor.  The pt is a DNR.  Per Lexine Baton, both she and Dr. [name], the attending physician are in agreement to transition to comfort care.  The ethics committee support the medical team's plan for comfort care.

## 2020-05-06 NOTE — Progress Notes (Signed)
PT Cancellation Note  Patient Details Name: Tina OTTAWAY MRN: [id number] DOB: 1945/09/08   Cancelled Treatment:     PT attempt. Pt refused. " I'm dying and don't want to be tortured." pt has had palliative consult and is looking like transitioning to comfort measures. Will sign off. Please reorder if plans change going forward.    Willette Pa 05/06/2020, 3:45 PM

## 2020-05-06 NOTE — Care Management Important Message (Signed)
Important Message  Patient Details  Name: Tina Salinas MRN: [id number] Date of Birth: [birth year]   Medicare Important Message Given:  Yes     [name] 05/06/2020, 1:33 PM

## 2020-05-06 NOTE — Progress Notes (Addendum)
   05/06/20 1400  Clinical Encounter Type  Visited With Patient and family together;Other (Comment)  Visit Type Follow-up  Referral From Chaplain  Consult/Referral To Chaplain  Chaplain answered page to assist Pt with her will. When chaplain arrived at the room, she met Pt's friend, [name], and [place] worker, Dreama Saa. They were told to call the chaplain because Pt is would like to revoke and change her will. Chaplain explain she is unable to help with this. Chaplain suggested they contact and attorney or go online to get information. DHS worker said [name] has been in contact with an attorney.  PLEASE ALLOW ATTORNEY UP TO SEE PATIENT TO HELP HER COMPLETE HER WILL.

## 2020-05-06 NOTE — Progress Notes (Signed)
Attempted to provide incontinent care to patient with help of nursing student, Mickel Baas. Patient refusing to turn any further to allow gown to be placed, refusing to be pulled up in the bed to eat, stated she just wouldn't eat if it meant she had to be pulled up. Attempted to encourage patient to allow staff to finish placing clean gown, patient refused, patient stated "I don't have to, the only thing I have to do is die and pay taxes."

## 2020-05-07 ENCOUNTER — Encounter: Payer: Medicare HMO | Admitting: Family Medicine

## 2020-05-07 DIAGNOSIS — Z66 Do not resuscitate: Secondary | ICD-10-CM

## 2020-05-07 DIAGNOSIS — Z515 Encounter for palliative care: Secondary | ICD-10-CM

## 2020-05-07 DIAGNOSIS — Z7189 Other specified counseling: Secondary | ICD-10-CM

## 2020-05-07 DIAGNOSIS — R627 Adult failure to thrive: Secondary | ICD-10-CM

## 2020-05-07 LAB — BASIC METABOLIC PANEL
Anion gap: 9 (ref 5–15)
BUN: 16 mg/dL (ref 8–23)
CO2: 22 mmol/L (ref 22–32)
Calcium: 7.5 mg/dL — ABNORMAL LOW (ref 8.9–10.3)
Chloride: 111 mmol/L (ref 98–111)
Creatinine, Ser: 0.68 mg/dL (ref 0.44–1.00)
GFR, Estimated: >60 mL/min (ref >60)
Glucose, Bld: 140 mg/dL — ABNORMAL HIGH (ref 70–99)
Potassium: 3.2 mmol/L — ABNORMAL LOW (ref 3.5–5.1)
Sodium: 142 mmol/L (ref 135–145)

## 2020-05-07 LAB — CBC
HCT: 28.3 % — ABNORMAL LOW (ref 36.0–46.0)
Hemoglobin: 8.1 g/dL — ABNORMAL LOW (ref 12.0–15.0)
MCH: 28 pg (ref 26.0–34.0)
MCHC: 28.6 g/dL — ABNORMAL LOW (ref 30.0–36.0)
MCV: 97.9 fL (ref 80.0–100.0)
Platelets: 397 10*3/uL (ref 150–400)
RBC: 2.89 MIL/uL — ABNORMAL LOW (ref 3.87–5.11)
RDW: 25 % — ABNORMAL HIGH (ref 11.5–15.5)
WBC: 8.2 10*3/uL (ref 4.0–10.5)
nRBC: 1.8 % — ABNORMAL HIGH (ref 0.0–0.2)

## 2020-05-07 MED ORDER — HALOPERIDOL 0.5 MG PO TABS
0.5000 mg | ORAL_TABLET | ORAL | Status: DC | PRN
  Filled 2020-05-07: qty 1

## 2020-05-07 MED ORDER — POLYVINYL ALCOHOL 1.4 % OP SOLN
1.0000 [drp] | Freq: Four times a day (QID) | OPHTHALMIC | Status: DC | PRN
  Filled 2020-05-07: qty 15

## 2020-05-07 MED ORDER — HYDROMORPHONE HCL 1 MG/ML IJ SOLN
0.5000 mg | INTRAMUSCULAR | Status: DC | PRN
  Administered 2020-05-09 (×2): 0.5 mg via INTRAVENOUS
  Filled 2020-05-07 (×2): qty 0.5

## 2020-05-07 MED ORDER — OXYCODONE HCL 5 MG/5ML PO SOLN
5.0000 mg | Freq: Four times a day (QID) | ORAL | Status: DC
  Administered 2020-05-07 – 2020-05-10 (×10): 5 mg via ORAL
  Filled 2020-05-07 (×11): qty 5

## 2020-05-07 MED ORDER — HALOPERIDOL LACTATE 2 MG/ML PO CONC
0.5000 mg | ORAL | Status: DC | PRN
  Filled 2020-05-07: qty 0.3

## 2020-05-07 MED ORDER — ACETAMINOPHEN 650 MG RE SUPP: 650.0000 mg | Freq: Four times a day (QID) | RECTAL | Status: DC | PRN

## 2020-05-07 MED ORDER — GLYCOPYRROLATE 1 MG PO TABS
1.0000 mg | ORAL_TABLET | ORAL | Status: DC | PRN
  Filled 2020-05-07: qty 1

## 2020-05-07 MED ORDER — GLYCOPYRROLATE 0.2 MG/ML IJ SOLN
0.2000 mg | INTRAMUSCULAR | Status: DC | PRN
  Filled 2020-05-07: qty 1

## 2020-05-07 MED ORDER — LORAZEPAM 1 MG PO TABS: 1.0000 mg | ORAL_TABLET | ORAL | Status: DC | PRN

## 2020-05-07 MED ORDER — BISACODYL 5 MG PO TBEC: 10.0000 mg | DELAYED_RELEASE_TABLET | Freq: Every day | ORAL | Status: DC | PRN

## 2020-05-07 MED ORDER — POTASSIUM CHLORIDE 20 MEQ PO PACK
40.0000 meq | PACK | Freq: Once | ORAL | Status: AC
  Administered 2020-05-07: 40 meq via ORAL
  Filled 2020-05-07: qty 2

## 2020-05-07 MED ORDER — LORAZEPAM 2 MG/ML PO CONC: 1.0000 mg | ORAL | Status: DC | PRN

## 2020-05-07 MED ORDER — HALOPERIDOL LACTATE 5 MG/ML IJ SOLN: 0.5000 mg | INTRAMUSCULAR | Status: DC | PRN

## 2020-05-07 MED ORDER — ACETAMINOPHEN 325 MG PO TABS: 650.0000 mg | ORAL_TABLET | Freq: Four times a day (QID) | ORAL | Status: DC | PRN

## 2020-05-07 MED ORDER — LORAZEPAM 2 MG/ML IJ SOLN: 1.0000 mg | INTRAMUSCULAR | Status: DC | PRN

## 2020-05-07 MED ORDER — BIOTENE DRY MOUTH MT LIQD: 15.0000 mL | OROMUCOSAL | Status: DC | PRN

## 2020-05-07 NOTE — Progress Notes (Signed)
[facility]) Hospital Liaison RN note:  Received request from [place] Scrape, [facility] for interest in [place]. Chart will be under review. I have clarified with [place] that patient's friend may sign her consents since patient has not family and is confused.    Hospice Home does not have a bed to offer today. Hospital care team is aware. [facility] Liaison will follow for room availability.  Please call with any hospice related questions or concerns.  Thank you for the opportunity to participate in this patient's care.  Zandra Abts, RN [facility] Liaison [phone]

## 2020-05-07 NOTE — TOC Progression Note (Signed)
Transition of Care [facility]) - Progression Note    Patient Details  Name: Tina Salinas MRN: [id number] Date of Birth: 1945-08-17  Transition of [facility]) CM/SW [place], LCSW Phone Number: 05/07/2020, 3:49 PM  Clinical Narrative:  Patient made comfort care today. Per palliative NP, patient is appropriate for the hospice house. Patient's friend/financial POA Norma Fredrickson is aware and willing to sign her consents once she admits there. Referral made to Zandra Abts, RN at [facility]. No bed today.  Expected Discharge Plan: Long Term Nursing Home Barriers to Discharge: ED Patient Insisting on an Alternate Living Situation/Facility  Expected Discharge Plan and Services Expected Discharge Plan: Long Term Nursing Home In-house Referral: Clinical Social Work   Post Acute Care Choice: Nursing Home Living arrangements for the past 2 months: Single Family Home                                       Social Determinants of Health (SDOH) Interventions    Readmission Risk Interventions No flowsheet data found.
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
## 2020-05-07 NOTE — Progress Notes (Signed)
OT Cancellation Note  Patient Details Name: Tina Salinas MRN: [id number] DOB: May 21, 1946   Cancelled Treatment:    Reason Eval/Treat Not Completed: Other (comment). Per [facility] - pt noted to be transitioning to comfort care. Will sign off at this time. Please reconsult if change of plans. Thank you.   Dessie Coma, M.S. OTR/L  05/07/20, 10:56 AM  ascom [phone]

## 2020-05-07 NOTE — Consult Note (Signed)
[facility] Nurse wound follow up Wound type: Stage 4 pressure injury to sacrum with NPWT Measurement:8 x 13 x 1cm Wound RUE:AVWU pink with with fibrinous material (slough) in wound bed.  Bone palpable. Drainage (amount, consistency, odor) Small amount serous Periwound: Intact, dry. Small amount of skin breakdown in the periwound at 6 o'clock, improved with piaulement of skin barrier ring from 4-7 o'clock on Wednesday. Will repeat this today. Dressing procedure/placement/frequency: Two pieces of black foam removed, wound cleansed and two pieces of black foam placed today. One is used to obliterate dead space in the central wound, the other is used to bridge to the right hip for pressure redistribution.  Dressing is attached to 133mmHg continuous negative pressure and an immediate seal is achieved.  Next dressing change is due on Monday, 05/10/20.  [facility] nursing team will follow, and will remain available to this patient, the nursing and medical teams.   Thanks, Maudie Flakes, MSN, RN, [place], Arther Abbott  Pager# [phone]

## 2020-05-07 NOTE — Progress Notes (Signed)
PROGRESS NOTE    Tina Salinas  GEX:[id number] DOB: February 09, 1946 DOA: 04/21/2020 PCP: Birdie Sons, MD   Assessment & Plan:   Active Problems:   Adjustment disorder with depressed mood   Infantile cerebral palsy (HCC)   Chronic combined systolic and diastolic CHF (congestive heart failure) (HCC)   Malnutrition of moderate degree   Hypokalemia   Pressure injury of skin   Sepsis ([place])  Failure to thrive: secondary to all below. Will transition the pt to comfort care only as myself, palliative care, ethics team agree that comfort care is what's best for the pt given the pt's poor quality of life and multiple medical conditions stated below. Palliative recs apprec   Suspected sepsis: likely secondary to sacral decubitus. Sepsis resolved. Continue on IV zosyn. Continue on IVFs. Urine cx NGTD. Blood cxs NGTD  Lactic acidosis: resolved  Stage III sacral ulcer: s/p I&D on 04/28/20. Wound cx grew pseudomonas. Continue on IV zosyn, will need a total of 14 day course. Continue w/ wound care  RUE DVT: continue on full dose lovenox   Fungal cystitis: completed anti-fungal course   Hypokalemia: within normal limits. Will continue to monitor  Dysphagia: started on dysphagia diet as per speech   Diarrhea: GI PCR panel is neg. Imodium prn   Acute on chronic systolic CHF: s/p IV lasix. Monitor I/Os. Echo showed EF <20%.   CAD: cardiac cath done on 9/21 showed nonobstructive coronary artery disease.Continue on aspirin.  PAF: continue on amiodarone. Continue to hold BB   HTN: continue on amiodarone. Continue on hold BB   Hx of rheumatoid arthritis: continue on prednisone, methotrexate, hydroxychloroquine  Adjustment disorder with depressed mood: continue on celexa.   Hyperlipidemia: continue on statin   Infantile cerebral palsy: bedridden. Continue w/ supportive care    Severe malnutrition: BMI 18.51. Continue w/ nutritional supplements    Thrombocytosis: resolved  ACD:  no need for a transfusion at this. Will continue to monitor    DVT prophylaxis: lovenox Code Status: DNR Family Communication:  Disposition Plan: likely d/c to SNF   Status is: Inpatient  Remains inpatient appropriate because:Unsafe d/c plan and IV treatments appropriate due to intensity of illness or inability to take PO will transition pt to comfort care only today    Dispo: The patient is from: Home              Anticipated d/c is to: unclear, will transition pt to comfort care only today              Anticipated d/c date is: > 3 days              Patient currently is not medically stable to d/c.    Consultants:   General surg   Chaplain   Palliative care    Procedures:    Antimicrobials: zosyn    Subjective: Pt is oriented to person only. Pt is very agitated   Objective: Vitals:   05/06/20 1557 05/06/20 2025 05/07/20 0004 05/07/20 0431  BP: (!) 87/42 (!) 90/45 101/64 92/61  Pulse: 68 78  68  Resp: 16 20 19 20   Temp: (!) 97.5 F (36.4 C) 98.6 F (37 C) 98 F (36.7 C) 97.7 F (36.5 C)  TempSrc: Oral Oral Oral Oral  SpO2: 98% 98% 99% 100%  Weight:      Height:        Intake/Output Summary (Last 24 hours) at 05/07/2020 0734 Last data filed at 05/07/2020 0500 Gross per 24  hour  Intake 532.85 ml  Output 550 ml  Net -17.15 ml   Filed Weights   04/21/20 1238 04/28/20 1251  Weight: 43 kg 43 kg    Examination:  General exam: Appears uncomfortable and agitated. Appears frail  Respiratory system: decreased breath sounds b/l  Cardiovascular system: S1&S2+. No rubs or gallops Gastrointestinal system: Abdomen is nondistended, soft and nontender. Hypoactive bowel sounds  Central nervous system: Oriented to person only. Moves all extremities  Psychiatry: judgement and insight appear abnormal. Agitated and frustrated     Data Reviewed: I have personally reviewed following labs and imaging studies  CBC: Recent Labs  Lab 05/02/20 0448 05/03/20 0327  05/05/20 0510 05/06/20 0542 05/07/20 0345  WBC 12.5* 11.6* 10.2 8.2 8.2  HGB 8.2* 8.2* 7.5* 7.5* 8.1*  HCT 27.5* 28.1* 25.8* 25.3* 28.3*  MCV 96.2 96.9 97.7 98.4 97.9  PLT 416* 461* 516* 370 884   Basic Metabolic Panel: Recent Labs  Lab 05/02/20 0448 05/03/20 0327 05/05/20 0510 05/06/20 0542 05/07/20 0345  NA 141 141 141 144 142  K 4.5 4.6 4.4 3.7 3.2*  CL 115* 112* 109 112* 111  CO2 20* 24 22 19* 22  GLUCOSE 125* 143* 90 54* 140*  BUN 14 17 22 18 16   CREATININE 0.83 0.89 1.06* 0.89 0.68  CALCIUM 7.5* 7.7* 7.8* 7.4* 7.5*   GFR: Estimated Creatinine Clearance: 43.1 mL/min (by C-G formula based on SCr of 0.68 mg/dL). Liver Function Tests: No results for input(s): AST, ALT, ALKPHOS, BILITOT, PROT, ALBUMIN in the last 168 hours. No results for input(s): LIPASE, AMYLASE in the last 168 hours. No results for input(s): AMMONIA in the last 168 hours. Coagulation Profile: No results for input(s): INR, PROTIME in the last 168 hours. Cardiac Enzymes: No results for input(s): CKTOTAL, CKMB, CKMBINDEX, TROPONINI in the last 168 hours. BNP (last 3 results) No results for input(s): PROBNP in the last 8760 hours. HbA1C: No results for input(s): HGBA1C in the last 72 hours. CBG: Recent Labs  Lab 05/06/20 0733 05/06/20 0804  GLUCAP 44* 183*   Lipid Profile: No results for input(s): CHOL, HDL, LDLCALC, TRIG, CHOLHDL, LDLDIRECT in the last 72 hours. Thyroid Function Tests: No results for input(s): TSH, T4TOTAL, FREET4, T3FREE, THYROIDAB in the last 72 hours. Anemia Panel: No results for input(s): VITAMINB12, FOLATE, FERRITIN, TIBC, IRON, RETICCTPCT in the last 72 hours. Sepsis Labs: No results for input(s): PROCALCITON, LATICACIDVEN in the last 168 hours.  Recent Results (from the past 240 hour(s))  Aerobic/Anaerobic Culture (surgical/deep wound)     Status: None   Collection Time: 04/28/20  2:10 PM   Specimen: Wound; Tissue  Result Value Ref Range Status   Specimen  Description   Final    WOUND Performed at [facility], [address] [place], [place] [address]    Special Requests   Final    TISSUE FROM SACRAL DECUBITIS WOUND Performed at [facility], [address] [place], [place] [address]    Gram Stain   Final    ABUNDANT WBC PRESENT, PREDOMINANTLY PMN FEW GRAM NEGATIVE RODS    Culture   Final    FEW PSEUDOMONAS AERUGINOSA FEW ENTEROCOCCUS FAECALIS FEW BACTEROIDES SPECIES BETA LACTAMASE POSITIVE Performed at [facility], [place] [address], [place],  [address]    Report Status 05/02/2020 FINAL  Final   Organism ID, Bacteria PSEUDOMONAS AERUGINOSA  Final   Organism ID, Bacteria ENTEROCOCCUS FAECALIS  Final      Susceptibility   Enterococcus  faecalis - MIC*    AMPICILLIN <=2 SENSITIVE Sensitive     VANCOMYCIN 2 SENSITIVE Sensitive     GENTAMICIN SYNERGY SENSITIVE Sensitive     * FEW ENTEROCOCCUS FAECALIS   Pseudomonas aeruginosa - MIC*    CEFTAZIDIME 4 SENSITIVE Sensitive     CIPROFLOXACIN <=0.25 SENSITIVE Sensitive     GENTAMICIN <=1 SENSITIVE Sensitive     IMIPENEM 2 SENSITIVE Sensitive     PIP/TAZO 16 SENSITIVE Sensitive     CEFEPIME 2 SENSITIVE Sensitive     * FEW PSEUDOMONAS AERUGINOSA  Urine Culture     Status: Abnormal   Collection Time: 04/29/20  4:30 AM   Specimen: Urine, Random  Result Value Ref Range Status   Specimen Description   Final    URINE, RANDOM Performed at [facility], [address], [place], [place] [address]    Special Requests   Final    NONE Performed at [facility], [address] [place], [place] [address]    Culture 40,000 COLONIES/mL YEAST (A)  Final   Report Status 04/30/2020 FINAL  Final         Radiology Studies: No results found.      Scheduled Meds: . amiodarone  200 mg Oral Daily  . vitamin C  250 mg Oral BID  . aspirin EC  81 mg Oral Daily  . calcium-vitamin D  2 tablet Oral Q breakfast  . Chlorhexidine Gluconate  Cloth  6 each Topical Daily  . cholecalciferol  1,000 Units Oral Daily  . citalopram  10 mg Oral Daily  . docusate sodium  100 mg Oral BID  . enoxaparin (LOVENOX) injection  1 mg/kg Subcutaneous Q12H  . feeding supplement  237 mL Oral TID BM  . ferrous [id number] C-folic acid  1 capsule Oral BID PC  . hydroxychloroquine  400 mg Oral Daily  . latanoprost  1 drop Both Eyes QHS  . megestrol  400 mg Oral Daily  . mirtazapine  15 mg Oral QHS  . multivitamin-lutein  1 capsule Oral Daily  . polyethylene glycol  17 g Oral Daily  . potassium chloride  40 mEq Oral Once  . pravastatin  40 mg Oral QHS  . predniSONE  2.5 mg Oral Q breakfast  . sodium chloride flush  10-40 mL Intracatheter Q12H   Continuous Infusions: . sodium chloride Stopped (05/06/20 1620)  . piperacillin-tazobactam (ZOSYN)  IV 3.375 g (05/07/20 0002)     LOS: 10 days    Time spent: 30 mins     Wyvonnia Dusky, MD [facility] Pager [phone]  If 7PM-7AM, please contact night-coverage [email or web address] 05/07/2020, 7:34 AM

## 2020-05-08 NOTE — Progress Notes (Signed)
PROGRESS NOTE    Tina Salinas  JSH:[id number] DOB: Aug 28, 1945 DOA: 04/21/2020 PCP: Birdie Sons, MD   Assessment & Plan:   Active Problems:   Adjustment disorder with depressed mood   Infantile cerebral palsy (HCC)   Chronic combined systolic and diastolic CHF (congestive heart failure) (HCC)   Malnutrition of moderate degree   Hypokalemia   Pressure injury of skin   Sepsis ([place])   Goals of care, counseling/discussion   Palliative care by specialist   DNR (do not resuscitate)   Comfort measures only status  Failure to thrive: secondary to all below. Continue w/ comfort care only. Palliative care following and recs apprec   Suspected sepsis: likely secondary to sacral decubitus. Sepsis resolved.  Urine cx NGTD. Blood cxs NGTD  Lactic acidosis: resolved  Stage III sacral ulcer: s/p I&D on 04/28/20. Wound cx grew pseudomonas. Comfort care only   RUE DVT: comfort care only   Fungal cystitis: completed anti-fungal course   Hypokalemia: within normal limits. Will continue to monitor  Dysphagia: continue on dysphagia diet as tolerated   Diarrhea: GI PCR panel is neg. Imodium prn   Acute on chronic systolic CHF: s/p IV lasix. Echo showed EF <20%.   CAD: cardiac cath done on 9/21 showed nonobstructive coronary artery disease.  PAF: comfort care only   HTN: comfort care only   Hx of rheumatoid arthritis: continue on prednisone  Adjustment disorder with depressed mood: continue on celexa.   Hyperlipidemia: comfort care only   Infantile cerebral palsy: bedridden. Continue w/ supportive care    Severe malnutrition: BMI 18.51. Continue w/ nutritional supplements    Thrombocytosis: resolved  ACD: no need for a transfusion at this.   DVT prophylaxis: comfort care only  Code Status: DNR Family Communication:  Disposition Plan: unclear as pt is on comfort care only   Status is: Inpatient  Remains inpatient appropriate because:Unsafe d/c plan and IV  treatments appropriate due to intensity of illness or inability to take PO  Continue w/ comfort care    Dispo: The patient is from: Home              Anticipated d/c is to: unclear, continue w/ comfort care only               Anticipated d/c date is: > 3 days              Patient currently is not medically stable to d/c.    Consultants:   General surg   Chaplain   Palliative care    Procedures:    Antimicrobials: zosyn    Subjective: Pt is lethargic.   Objective: Vitals:   05/07/20 1127 05/07/20 1140 05/07/20 1633 05/07/20 2117  BP:  103/60 115/74 (!) 96/59  Pulse: 80 75 84 77  Resp: 16  16 18   Temp: 97.7 F (36.5 C)  97.6 F (36.4 C) 98.3 F (36.8 C)  TempSrc:    Oral  SpO2: 99%  100% 100%  Weight:      Height:        Intake/Output Summary (Last 24 hours) at 05/08/2020 0711 Last data filed at 05/07/2020 1846 Gross per 24 hour  Intake 0.18 ml  Output --  Net 0.18 ml   Filed Weights   04/21/20 1238 04/28/20 1251  Weight: 43 kg 43 kg    Examination:  General exam: Appears lethargic  Respiratory system: diminished breath sounds b/l Cardiovascular system: S1/S2+. No rubs or gallops Gastrointestinal system: Abdomen is  nondistended, soft and nontender. Hypoactive bowel sounds  Central nervous system: Oriented to person only. Moves all extremities  Psychiatry: judgement and insight appear abnormal. Flat mood and affect    Data Reviewed: I have personally reviewed following labs and imaging studies  CBC: Recent Labs  Lab 05/02/20 0448 05/03/20 0327 05/05/20 0510 05/06/20 0542 05/07/20 0345  WBC 12.5* 11.6* 10.2 8.2 8.2  HGB 8.2* 8.2* 7.5* 7.5* 8.1*  HCT 27.5* 28.1* 25.8* 25.3* 28.3*  MCV 96.2 96.9 97.7 98.4 97.9  PLT 416* 461* 516* 370 428   Basic Metabolic Panel: Recent Labs  Lab 05/02/20 0448 05/03/20 0327 05/05/20 0510 05/06/20 0542 05/07/20 0345  NA 141 141 141 144 142  K 4.5 4.6 4.4 3.7 3.2*  CL 115* 112* 109 112* 111  CO2 20*  24 22 19* 22  GLUCOSE 125* 143* 90 54* 140*  BUN 14 17 22 18 16   CREATININE 0.83 0.89 1.06* 0.89 0.68  CALCIUM 7.5* 7.7* 7.8* 7.4* 7.5*   GFR: Estimated Creatinine Clearance: 43.1 mL/min (by C-G formula based on SCr of 0.68 mg/dL). Liver Function Tests: No results for input(s): AST, ALT, ALKPHOS, BILITOT, PROT, ALBUMIN in the last 168 hours. No results for input(s): LIPASE, AMYLASE in the last 168 hours. No results for input(s): AMMONIA in the last 168 hours. Coagulation Profile: No results for input(s): INR, PROTIME in the last 168 hours. Cardiac Enzymes: No results for input(s): CKTOTAL, CKMB, CKMBINDEX, TROPONINI in the last 168 hours. BNP (last 3 results) No results for input(s): PROBNP in the last 8760 hours. HbA1C: No results for input(s): HGBA1C in the last 72 hours. CBG: Recent Labs  Lab 05/06/20 0733 05/06/20 0804  GLUCAP 44* 183*   Lipid Profile: No results for input(s): CHOL, HDL, LDLCALC, TRIG, CHOLHDL, LDLDIRECT in the last 72 hours. Thyroid Function Tests: No results for input(s): TSH, T4TOTAL, FREET4, T3FREE, THYROIDAB in the last 72 hours. Anemia Panel: No results for input(s): VITAMINB12, FOLATE, FERRITIN, TIBC, IRON, RETICCTPCT in the last 72 hours. Sepsis Labs: No results for input(s): PROCALCITON, LATICACIDVEN in the last 168 hours.  Recent Results (from the past 240 hour(s))  Aerobic/Anaerobic Culture (surgical/deep wound)     Status: None   Collection Time: 04/28/20  2:10 PM   Specimen: Wound; Tissue  Result Value Ref Range Status   Specimen Description   Final    WOUND Performed at [facility], [address], [place], [place] [address]    Special Requests   Final    TISSUE FROM SACRAL DECUBITIS WOUND Performed at [facility], [address] [place], [place] [address]    Gram Stain   Final    ABUNDANT WBC PRESENT, PREDOMINANTLY PMN FEW GRAM NEGATIVE RODS    Culture   Final    FEW PSEUDOMONAS AERUGINOSA FEW  ENTEROCOCCUS FAECALIS FEW BACTEROIDES SPECIES BETA LACTAMASE POSITIVE Performed at [facility], [place] [address], [place], [place] [address]    Report Status 05/02/2020 FINAL  Final   Organism ID, Bacteria PSEUDOMONAS AERUGINOSA  Final   Organism ID, Bacteria ENTEROCOCCUS FAECALIS  Final      Susceptibility   Enterococcus faecalis - MIC*    AMPICILLIN <=2 SENSITIVE Sensitive     VANCOMYCIN 2 SENSITIVE Sensitive     GENTAMICIN SYNERGY SENSITIVE Sensitive     * FEW ENTEROCOCCUS FAECALIS   Pseudomonas aeruginosa - MIC*    CEFTAZIDIME 4 SENSITIVE Sensitive     CIPROFLOXACIN <=0.25 SENSITIVE Sensitive     GENTAMICIN <=1  SENSITIVE Sensitive     IMIPENEM 2 SENSITIVE Sensitive     PIP/TAZO 16 SENSITIVE Sensitive     CEFEPIME 2 SENSITIVE Sensitive     * FEW PSEUDOMONAS AERUGINOSA  Urine Culture     Status: Abnormal   Collection Time: 04/29/20  4:30 AM   Specimen: Urine, Random  Result Value Ref Range Status   Specimen Description   Final    URINE, RANDOM Performed at [facility], [address], [place], [place] [address]    Special Requests   Final    NONE Performed at [facility], [address] [place], [place] [address]    Culture 40,000 COLONIES/mL YEAST (A)  Final   Report Status 04/30/2020 FINAL  Final         Radiology Studies: No results found.      Scheduled Meds: . citalopram  10 mg Oral Daily  . docusate sodium  100 mg Oral BID  . feeding supplement  237 mL Oral TID BM  . hydroxychloroquine  400 mg Oral Daily  . latanoprost  1 drop Both Eyes QHS  . mirtazapine  15 mg Oral QHS  . oxyCODONE  5 mg Oral Q6H  . polyethylene glycol  17 g Oral Daily  . predniSONE  2.5 mg Oral Q breakfast  . sodium chloride flush  10-40 mL Intracatheter Q12H   Continuous Infusions: . sodium chloride Stopped (05/07/20 0930)     LOS: 11 days    Time spent: 25 mins     Wyvonnia Dusky, MD [facility] Pager [phone]  If  7PM-7AM, please contact night-coverage [email or web address] 05/08/2020, 7:11 AM

## 2020-05-08 NOTE — Progress Notes (Signed)
Patient very confused this morning, feels we are preparing to go to a party. Patient refused her meds, was able to convince her to take pain medication and to accept mouth care. Patient refused any other repositioning at this time.

## 2020-05-08 NOTE — Consult Note (Signed)
[facility] Nurse Consult Note: I initiated a conversation via [place] with patient's MD today regarding discontinuing NPWT dressing changes. Patient is now [place] and the dressing changes are very painful for her despite premedication and bridging to the hip. Will provide Nursing with that guidance and provide Orders for topical care to be performed daily.  [place] nursing team will not follow, but will remain available to this patient, the nursing and medical teams.  Please re-consult if needed. Thanks, Maudie Flakes, MSN, RN, [place], Arther Abbott  Pager# [phone]

## 2020-05-09 NOTE — TOC Progression Note (Signed)
Transition of Care [facility]) - Progression Note    Patient Details  Name: Tina Salinas MRN: [id number] Date of Birth: June 05, 1946  [facility] CM/SW Contact  Zigmund Daniel Dorian Pod, RN Phone Number: 05/09/2020, 10:37 AM  Clinical Narrative:    RN followed up with [facility]) for inpt hospice who has indicated no beds are available at this time.   TOC team will continue to follow.  Expected Discharge Plan: Long Term Nursing Home Barriers to Discharge: ED Patient Insisting on an Alternate Living Situation/Facility  Expected Discharge Plan and Services Expected Discharge Plan: Long Term Nursing Home In-house Referral: Clinical Social Work   Post Acute Care Choice: Nursing Home Living arrangements for the past 2 months: Single Family Home                                       Social Determinants of Health (SDOH) Interventions    Readmission Risk Interventions No flowsheet data found.

## 2020-05-09 NOTE — Progress Notes (Signed)
PROGRESS NOTE    Tina Salinas  YSA:[id number] DOB: Mar 16, 1946 DOA: 04/21/2020 PCP: Birdie Sons, MD   Assessment & Plan:   Active Problems:   Adjustment disorder with depressed mood   Infantile cerebral palsy (HCC)   Chronic combined systolic and diastolic CHF (congestive heart failure) (HCC)   Malnutrition of moderate degree   Hypokalemia   Pressure injury of skin   Sepsis ([place])   Goals of care, counseling/discussion   Palliative care by specialist   DNR (do not resuscitate)   Comfort measures only status  Failure to thrive: secondary to all below. Continue w/ comfort care only. Continue on dilaudid, oxycodone prn. Palliative care following and recs apprec   Suspected sepsis: likely secondary to sacral decubitus. Sepsis resolved.  Urine cx NGTD. Blood cxs NGTD  Lactic acidosis: resolved  Stage III sacral ulcer: s/p I&D on 04/28/20. Wound cx grew pseudomonas. Comfort care only   RUE DVT: comfort care only   Fungal cystitis: completed anti-fungal course   Hypokalemia: within normal limits. Will continue to monitor  Dysphagia: continue on dysphagia diet as tolerated   Diarrhea: GI PCR panel is neg. Imodium prn   Acute on chronic systolic CHF: s/p IV lasix. Echo showed EF <20%.   CAD: cardiac cath done on 9/21 showed nonobstructive coronary artery disease.  PAF: comfort care only   HTN: comfort care only   Hx of rheumatoid arthritis: continue on prednisone  Adjustment disorder with depressed mood: continue on celexa.   Hyperlipidemia: comfort care only   Infantile cerebral palsy: bedridden. Continue w/ supportive care    Severe malnutrition: BMI 18.51. Continue w/ nutritional supplements    Thrombocytosis: resolved  ACD: no need for a transfusion at this.   DVT prophylaxis: comfort care only  Code Status: DNR Family Communication:  Disposition Plan: unclear as pt is on comfort care only   Status is: Inpatient  Remains inpatient  appropriate because:Unsafe d/c plan and IV treatments appropriate due to intensity of illness or inability to take PO  Continue w/ comfort care    Dispo: The patient is from: Home              Anticipated d/c is to: unclear, continue w/ comfort care only               Anticipated d/c date is: > 3 days              Patient currently is not medically stable to d/c.    Consultants:   General surg   Chaplain   Palliative care    Procedures:    Antimicrobials: zosyn    Subjective: Pt is lethargic.   Objective: Vitals:   05/07/20 1633 05/07/20 2117 05/08/20 1258 05/08/20 2029  BP: 115/74 (!) 96/59 102/77 106/68  Pulse: 84 77 77 85  Resp: 16 18 15 20   Temp: 97.6 F (36.4 C) 98.3 F (36.8 C) 98.5 F (36.9 C) 97.8 F (36.6 C)  TempSrc:  Oral Oral Oral  SpO2: 100% 100% 100% 100%  Weight:      Height:        Intake/Output Summary (Last 24 hours) at 05/09/2020 0735 Last data filed at 05/09/2020 0500 Gross per 24 hour  Intake 120 ml  Output 400 ml  Net -280 ml   Filed Weights   04/21/20 1238 04/28/20 1251  Weight: 43 kg 43 kg    Examination:  General exam: Appears lethargic Respiratory system: decreased breath sounds b/l  Cardiovascular system:  S1/S2+. No rubs or gallops Gastrointestinal system: Abdomen is nondistended, soft and nontender. Hypoactive bowel sounds  Central nervous system: Oriented to person only. Moves all extremities  Psychiatry: judgement and insight appear abnormal. Flat mood and affect     Data Reviewed: I have personally reviewed following labs and imaging studies  CBC: Recent Labs  Lab 05/03/20 0327 05/05/20 0510 05/06/20 0542 05/07/20 0345  WBC 11.6* 10.2 8.2 8.2  HGB 8.2* 7.5* 7.5* 8.1*  HCT 28.1* 25.8* 25.3* 28.3*  MCV 96.9 97.7 98.4 97.9  PLT 461* 516* 370 245   Basic Metabolic Panel: Recent Labs  Lab 05/03/20 0327 05/05/20 0510 05/06/20 0542 05/07/20 0345  NA 141 141 144 142  K 4.6 4.4 3.7 3.2*  CL 112* 109 112*  111  CO2 24 22 19* 22  GLUCOSE 143* 90 54* 140*  BUN 17 22 18 16   CREATININE 0.89 1.06* 0.89 0.68  CALCIUM 7.7* 7.8* 7.4* 7.5*   GFR: Estimated Creatinine Clearance: 43.1 mL/min (by C-G formula based on SCr of 0.68 mg/dL). Liver Function Tests: No results for input(s): AST, ALT, ALKPHOS, BILITOT, PROT, ALBUMIN in the last 168 hours. No results for input(s): LIPASE, AMYLASE in the last 168 hours. No results for input(s): AMMONIA in the last 168 hours. Coagulation Profile: No results for input(s): INR, PROTIME in the last 168 hours. Cardiac Enzymes: No results for input(s): CKTOTAL, CKMB, CKMBINDEX, TROPONINI in the last 168 hours. BNP (last 3 results) No results for input(s): PROBNP in the last 8760 hours. HbA1C: No results for input(s): HGBA1C in the last 72 hours. CBG: Recent Labs  Lab 05/06/20 0733 05/06/20 0804  GLUCAP 44* 183*   Lipid Profile: No results for input(s): CHOL, HDL, LDLCALC, TRIG, CHOLHDL, LDLDIRECT in the last 72 hours. Thyroid Function Tests: No results for input(s): TSH, T4TOTAL, FREET4, T3FREE, THYROIDAB in the last 72 hours. Anemia Panel: No results for input(s): VITAMINB12, FOLATE, FERRITIN, TIBC, IRON, RETICCTPCT in the last 72 hours. Sepsis Labs: No results for input(s): PROCALCITON, LATICACIDVEN in the last 168 hours.  No results found for this or any previous visit (from the past 240 hour(s)).       Radiology Studies: No results found.      Scheduled Meds: . citalopram  10 mg Oral Daily  . docusate sodium  100 mg Oral BID  . feeding supplement  237 mL Oral TID BM  . hydroxychloroquine  400 mg Oral Daily  . latanoprost  1 drop Both Eyes QHS  . mirtazapine  15 mg Oral QHS  . oxyCODONE  5 mg Oral Q6H  . polyethylene glycol  17 g Oral Daily  . predniSONE  2.5 mg Oral Q breakfast  . sodium chloride flush  10-40 mL Intracatheter Q12H   Continuous Infusions: . sodium chloride Stopped (05/07/20 0930)     LOS: 12 days    Time  spent: 22 mins     Wyvonnia Dusky, MD [facility] Pager [phone]  If 7PM-7AM, please contact night-coverage [email or web address] 05/09/2020, 7:35 AM

## 2020-05-10 MED ORDER — OXYCODONE HCL 5 MG/5ML PO SOLN: 5.0000 mg | Freq: Four times a day (QID) | ORAL | 0 refills | Status: AC

## 2020-05-10 NOTE — Plan of Care (Signed)
°  Problem: Education: Goal: Knowledge of General Education information will improve Description: Including pain rating scale, medication(s)/side effects and non-pharmacologic comfort measures Outcome: Adequate for Discharge   Problem: Health Behavior/Discharge Planning: Goal: Ability to manage health-related needs will improve Outcome: Adequate for Discharge   Problem: Activity: Goal: Risk for activity intolerance will decrease Outcome: Adequate for Discharge   Problem: Nutrition: Goal: Adequate nutrition will be maintained Outcome: Adequate for Discharge   Problem: Coping: Goal: Level of anxiety will decrease Outcome: Adequate for Discharge   Problem: Elimination: Goal: Will not experience complications related to bowel motility Outcome: Adequate for Discharge Goal: Will not experience complications related to urinary retention Outcome: Adequate for Discharge   Problem: Pain Managment: Goal: General experience of comfort will improve Outcome: Adequate for Discharge   Problem: Safety: Goal: Ability to remain free from injury will improve Outcome: Adequate for Discharge   Problem: Skin Integrity: Goal: Risk for impaired skin integrity will decrease Outcome: Adequate for Discharge   

## 2020-05-10 NOTE — Progress Notes (Signed)
[facility]) Hospital Liaison RN note:  Visited with patient in room. She was resting in bed and alert to self. Eligibility has been approved for [facility] and they do have a bed available today. Spoke with friend, Norma Fredrickson on the phone to explain services. She verbalized understanding. She is going to sign consents at the [facility] at 1 pm today and transport is arranged for 5pm. I will fax the discharge summary once available.  Please call with any hospice related questions or concerns.  Thank you for the opportunity to participate in this patient's care.  Zandra Abts, RN [facility] Liaison [phone]

## 2020-05-10 NOTE — Progress Notes (Signed)
   05/10/20 1255  Clinical Encounter Type  Visited With Patient;Health care provider  Visit Type Follow-up  Referral From Chaplain  Consult/Referral To [place] followed up Pt, but didn't stay because NT was feeding her and chaplain did not want to interrupt.

## 2020-05-10 NOTE — Care Plan (Signed)
Report called to hospice house Sharlyne Pacas at [phone].

## 2020-05-10 NOTE — Discharge Summary (Signed)
Physician Discharge Summary  Tina Salinas:[id number] DOB: 11-Dec-1945 DOA: 04/21/2020  PCP: Tina Sons, MD  Admit date: 04/21/2020 Discharge date: 05/10/2020  Admitted From: home Disposition:  Hospice facility   Recommendations for Outpatient Follow-up:  1. Follow up with hospice provider ASAP  Home Health: no Equipment/Devices:  Discharge Condition: hospice/comfort care CODE STATUS: DNR/DNI Diet recommendation: dysphagia I as tolerated    Brief/Interim Summary: HPI was taken from Dr. Tawanna Solo: Tina Salinas is a 74 y.o. female with medical history significant of cerebral palsy, congestive heart failure, hypertension, rheumatoid arthritis, hyperlipidemia, proximal A. fib who presents from home with complaints of generalized weakness, failure to thrive.  She does not have any family members and she lives alone and is taken care of by home health aides.  She is under adult protective services.  She does not ambulate and stays on the bed.  Last time she ambulated was a long time ago as per the patient.  Recently, patient has started firing the home health providers and APS is trying to arrange a placement facility.  She was presented to the emergency department with failure to thrive and generalized weakness as well as psychiatric evaluation. She was also seen by psychiatry during this hospitalization who did not recommend inpatient psychiatric admission.  On presentation the emergency department on 04/21/2020 she was suspected to have urine tract infection per the blood work done today showed elevated lactate, severe hypokalemia so we were requested for medical admission.  Social worker has been actively following her in the emergency department since last few days for placement. Patient seen and examined at the bedside in the emergency department.  During my evaluation, she was alert and awake, oriented to the place and year and denied any specific complaints.  RN reported me that she is  having diarrhea. Patient denied any chest pain, shortness of breath, fever, cough, abdomen, nausea, vomiting, dysuria.  She repeatedly told me that she does not want to be placed in a nursing facility and wants to go back to home.  ED Course: Hemodynamically stable during my evaluation but her blood pressure was soft.  She looked comfortable.  She communicates well.  She says she has been bedbound for last several years but she used to work when she was younger.  She does not have any family members but has friends.  She denied firing the home health service staff.  Hospital Course from Dr. Lenise Herald 10/19-10/25/21: Pt evidently presented w/ likely sepsis secondary sacral decubitus ulcer. Pt had an I&D on 04/28/20 and wound cxs grew pseudomonas. Pt was treated w/ IV zosyn, wound vac and wound dressing changes. Pt was barely able to tolerate wound dressing changes often screaming "I must be in hell."  Also, pt's mental status was waxing and waning throughout hospital stay. Pt appetite continued to diminished even though pt was already severely malnourished. Of note, palliative care had been following the pt and recommended that comfort care be started after a discussion with the pt, myself as well as the ethics team. Unfortunately, pt does not have a MPOA to help make medical decisions for the pt. Pt was transitioned to comfort care secondary to failure to thrive, poor quality of life as well as all below stated health problems. For more information please see other progress/consult notes.    Discharge Diagnoses:  Active Problems:   Adjustment disorder with depressed mood   Infantile cerebral palsy (HCC)   Chronic combined systolic and diastolic CHF (congestive  heart failure) (HCC)   Malnutrition of moderate degree   Hypokalemia   Pressure injury of skin   Sepsis ([place])   Goals of care, counseling/discussion   Palliative care by specialist   DNR (do not resuscitate)   Comfort measures only  status Failure to thrive: secondary to all below. Continue w/ comfort care only. Continue on dilaudid, oxycodone prn. Palliative care following and recs apprec   Suspected sepsis: likely secondary to sacral decubitus. Sepsis resolved.  Urine cx NGTD. Blood cxs NGTD  Lactic acidosis: resolved  Stage III sacral ulcer: s/p I&D on 04/28/20. Wound cx grew pseudomonas. Comfort care only   RUE DVT: comfort care only   Fungal cystitis: completed anti-fungal course   Hypokalemia: within normal limits. Will continue to monitor  Dysphagia: continue on dysphagia diet as tolerated   Diarrhea:GI PCR panel is neg. Imodium prn   Acute on chronic systolic CHF: s/p IV lasix. Echo showed EF <20%.   CAD: cardiac cath done on 9/21 showed nonobstructive coronary artery disease.  PAF: comfort care only   HTN: comfort care only   Hx of rheumatoid arthritis: continue on prednisone  Adjustment disorder with depressed mood: continue on celexa.   Hyperlipidemia: comfort care only   Infantile cerebral palsy: bedridden. Continue w/ supportive care    Severe malnutrition: BMI 18.51. Continue w/ nutritional supplements    Thrombocytosis: resolved  ACD: no need for a transfusion at this.   Discharge Instructions  Discharge Instructions    Diet full liquid   Complete by: As directed    Continue on dysphagia diet as tolerated   Discharge instructions   Complete by: As directed    F/u w/ hospice provider ASAP   Discharge wound care:   Complete by: As directed    As stated above but pt does not often tolerate wound care. Ok to not do wound care   Increase activity slowly   Complete by: As directed      Allergies as of 05/10/2020      Reactions   Etodolac    Other reaction(s): Unknown   Sulfa Antibiotics    Tetracycline       Medication List    STOP taking these medications   megestrol 400 MG/10ML suspension Commonly known as: MEGACE     TAKE these medications    alendronate 70 MG tablet Commonly known as: FOSAMAX Take 70 mg by mouth once a week.   amiodarone 200 MG tablet Commonly known as: PACERONE Take 200 mg by mouth daily.   aspirin 81 MG tablet Take 81 mg by mouth daily.   calcium-vitamin D 500-200 MG-UNIT tablet Commonly known as: OSCAL WITH D Take 2 tablets by mouth daily with breakfast.   carvedilol 3.125 MG tablet Commonly known as: COREG Take 3.125 mg by mouth 2 (two) times daily.   cholecalciferol 1000 units tablet Commonly known as: VITAMIN D Take 1,000 Units by mouth daily.   docusate sodium 100 MG capsule Commonly known as: COLACE Take 1 capsule (100 mg total) by mouth daily.   ferrous [id number] C-folic acid capsule Commonly known as: TRINSICON / FOLTRIN Take 1 capsule by mouth 2 (two) times daily after a meal.   furosemide 20 MG tablet Commonly known as: Lasix Take 1 tablet (20 mg total) by mouth daily as needed for fluid or edema (weight gain of 3lbs in 1 day or 5 lbs in 3 days).   HYDROcodone-acetaminophen 7.5-325 MG tablet Commonly known as: NORCO Take 1-2 tablets by mouth  3 (three) times daily as needed for moderate pain.   hydroxychloroquine 200 MG tablet Commonly known as: PLAQUENIL Take 400 mg by mouth daily.   isosorbide mononitrate 30 MG 24 hr tablet Commonly known as: IMDUR Take 1 tablet (30 mg total) by mouth daily.   Methotrexate 25 MG/ML Sosy Inject 0.8 mLs into the muscle every 7 (seven) days.   mirtazapine 30 MG tablet Commonly known as: Remeron Take 1 tablet (30 mg total) by mouth at bedtime.   oxybutynin 5 MG tablet Commonly known as: DITROPAN TAKE 1 TABLET BY MOUTH TWICE DAILY AS NEEDED FOR BLADDER SPASMS   oxyCODONE 5 MG/5ML solution Commonly known as: ROXICODONE Take 5 mLs (5 mg total) by mouth every 6 (six) hours.   pravastatin 40 MG tablet Commonly known as: PRAVACHOL Take 1 tablet (40 mg total) by mouth at bedtime.   predniSONE 2.5 MG tablet Commonly known  as: DELTASONE Take 2.5 mg by mouth daily with breakfast.   promethazine 25 MG tablet Commonly known as: PHENERGAN Take 25 mg by mouth every 6 (six) hours as needed for nausea or vomiting.   Vitamin D (Ergocalciferol) 1.25 MG (50000 UNIT) Caps capsule Commonly known as: DRISDOL Take 1 capsule (50,000 Units total) by mouth every 7 (seven) days.   Xalatan 0.005 % ophthalmic solution Generic drug: latanoprost Place 1 drop into both eyes at bedtime.            Discharge Care Instructions  (From admission, onward)         Start     Ordered   05/10/20 0000  Discharge wound care:       Comments: As stated above but pt does not often tolerate wound care. Ok to not do wound care   05/10/20 1200          Allergies  Allergen Reactions  . Etodolac     Other reaction(s): Unknown  . Sulfa Antibiotics   . Tetracycline     Consultations:  General surg    Procedures/Studies: US Venous Img Upper Uni Right(DVT)  Result Date: 05/03/2020 CLINICAL DATA:  Central line placement. EXAM: Right UPPER EXTREMITY VENOUS DOPPLER ULTRASOUND TECHNIQUE: Gray-scale sonography with graded compression, as well as color Doppler and duplex ultrasound were performed to evaluate the upper extremity deep venous system from the level of the subclavian vein and including the jugular, axillary, basilic, radial, ulnar and upper cephalic vein. Spectral Doppler was utilized to evaluate flow at rest and with distal augmentation maneuvers. COMPARISON:  None. FINDINGS: Contralateral Subclavian Vein: Respiratory phasicity is normal and symmetric with the symptomatic side. No evidence of thrombus. Normal compressibility. Internal Jugular Vein: No evidence of thrombus. Normal compressibility, respiratory phasicity and response to augmentation. Subclavian Vein: Noncompressible with no flow consistent with occlusive thrombus. Axillary Vein: Noncompressible with no flow consistent with occlusive thrombus. Cephalic Vein:  Not visualized. Basilic Vein: Noncompressible with no flow consistent with occlusive thrombus. Brachial Veins: No evidence of thrombus. Normal compressibility, respiratory phasicity and response to augmentation. Radial Veins: No evidence of thrombus. Normal compressibility, respiratory phasicity and response to augmentation. Ulnar Veins: No evidence of thrombus. Normal compressibility, respiratory phasicity and response to augmentation. Venous Reflux:  None visualized. Other Findings:  None visualized. IMPRESSION: Deep venous thrombosis is seen within the right subclavian, axillary and basilic veins. Electronically Signed   By: Marijo Conception M.D.   On: 05/03/2020 15:17   DG Chest Port 1 View  Result Date: 05/03/2020 CLINICAL DATA:  PICC placement EXAM: PORTABLE CHEST  1 VIEW COMPARISON:  04/21/2020 FINDINGS: Right arm PICC tip at the cavoatrial junction. Progression of diffuse bilateral airspace disease. Progression of left lower lobe collapse and left effusion. Small right effusion. IMPRESSION: PICC tip at the cavoatrial junction Worsening bilateral airspace disease consistent with pneumonia. Electronically Signed   By: Franchot Gallo M.D.   On: 05/03/2020 13:49   DG Chest Port 1 View  Result Date: 04/21/2020 CLINICAL DATA:  Questionable sepsis. EXAM: PORTABLE CHEST 1 VIEW COMPARISON:  None. FINDINGS: Enlarged cardiac silhouette. Chronic bronchitic markings. No effusion, infiltrate or pneumothorax. Bilateral breast prosthetics noted. Degenerative changes of the shoulders. IMPRESSION: No clear acute cardiopulmonary process. Electronically Signed   By: Suzy Bouchard M.D.   On: 04/21/2020 13:34   [place] EKG SITE RITE  Result Date: 04/28/2020 If Site Rite image not attached, placement could not be confirmed due to current cardiac rhythm.    Subjective: Pt is still lethargic but able to answer questions intermittently     Discharge Exam: Vitals:   05/09/20 2325 05/10/20 0911  BP: 113/61 116/66   Pulse: 72 77  Resp: 16 18  Temp: 98 F (36.7 C) (!) 97.3 F (36.3 C)  SpO2: 99% 100%   Vitals:   05/08/20 2029 05/09/20 0923 05/09/20 2325 05/10/20 0911  BP: 106/68 120/80 113/61 116/66  Pulse: 85 80 72 77  Resp: 20 20 16 18   Temp: 97.8 F (36.6 C) 98 F (36.7 C) 98 F (36.7 C) (!) 97.3 F (36.3 C)  TempSrc: Oral Oral  Oral  SpO2: 100% 100% 99% 100%  Weight:      Height:        General exam: Appears lethargic  Respiratory system: diminished breath sounds b/l  Cardiovascular system: S1/S2+. No rubs or gallops Gastrointestinal system: Abdomen is nondistended, soft and nontender. Hypoactive bowel sounds  Central nervous system: Oriented to person only. Moves all extremities  Psychiatry: judgement and insight appear abnormal. Flat mood and affect    The results of significant diagnostics from this hospitalization (including imaging, microbiology, ancillary and laboratory) are listed below for reference.     Microbiology: No results found for this or any previous visit (from the past 240 hour(s)).   Labs: BNP (last 3 results) Recent Labs    10/02/19 0229 11/28/19 1400 11/30/19 0736  BNP 2,680.0* 3,486.0* 536.1*   Basic Metabolic Panel: Recent Labs  Lab 05/05/20 0510 05/06/20 0542 05/07/20 0345  NA 141 144 142  K 4.4 3.7 3.2*  CL 109 112* 111  CO2 22 19* 22  GLUCOSE 90 54* 140*  BUN 22 18 16   CREATININE 1.06* 0.89 0.68  CALCIUM 7.8* 7.4* 7.5*   Liver Function Tests: No results for input(s): AST, ALT, ALKPHOS, BILITOT, PROT, ALBUMIN in the last 168 hours. No results for input(s): LIPASE, AMYLASE in the last 168 hours. No results for input(s): AMMONIA in the last 168 hours. CBC: Recent Labs  Lab 05/05/20 0510 05/06/20 0542 05/07/20 0345  WBC 10.2 8.2 8.2  HGB 7.5* 7.5* 8.1*  HCT 25.8* 25.3* 28.3*  MCV 97.7 98.4 97.9  PLT 516* 370 397   Cardiac Enzymes: No results for input(s): CKTOTAL, CKMB, CKMBINDEX, TROPONINI in the last 168  hours. BNP: Invalid input(s): POCBNP CBG: Recent Labs  Lab 05/06/20 0733 05/06/20 0804  GLUCAP 44* 183*   D-Dimer No results for input(s): DDIMER in the last 72 hours. Hgb A1c No results for input(s): HGBA1C in the last 72 hours. Lipid Profile No results for input(s): CHOL, HDL, LDLCALC,  TRIG, CHOLHDL, LDLDIRECT in the last 72 hours. Thyroid function studies No results for input(s): TSH, T4TOTAL, T3FREE, THYROIDAB in the last 72 hours.  Invalid input(s): FREET3 Anemia work up No results for input(s): VITAMINB12, FOLATE, FERRITIN, TIBC, IRON, RETICCTPCT in the last 72 hours. Urinalysis    Component Value Date/Time   COLORURINE AMBER (A) 04/26/2020 1458   APPEARANCEUR CLOUDY (A) 04/26/2020 1458   LABSPEC 1.044 (H) 04/26/2020 1458   PHURINE 5.0 04/26/2020 1458   GLUCOSEU NEGATIVE 04/26/2020 1458   HGBUR SMALL (A) 04/26/2020 1458   BILIRUBINUR NEGATIVE 04/26/2020 1458   KETONESUR 20 (A) 04/26/2020 1458   PROTEINUR 100 (A) 04/26/2020 1458   NITRITE POSITIVE (A) 04/26/2020 1458   LEUKOCYTESUR TRACE (A) 04/26/2020 1458   Sepsis Labs Invalid input(s): PROCALCITONIN,  WBC,  LACTICIDVEN Microbiology No results found for this or any previous visit (from the past 240 hour(s)).   Time coordinating discharge: Over 30 minutes  SIGNED:   Wyvonnia Dusky, MD  [facility] 05/10/2020, 1:24 PM Pager   If 7PM-7AM, please contact night-coverage [email or web address]

## 2020-05-10 NOTE — Discharge Planning (Signed)
Cell phone sent with patient and charger.

## 2020-05-10 NOTE — Progress Notes (Signed)
   Daily Progress Note   Patient Name: Tina Salinas       Date: 05/10/2020 DOB: [birth year]  Age: 74 y.o. MRN#: [id number] Attending Physician: Wyvonnia Dusky, MD Primary Care Physician: Birdie Sons, MD Admit Date: 04/21/2020  Reason for Consultation/Follow-up: Non pain symptom management, Pain control, Psychosocial/spiritual support and Terminal Care  Subjective: Chart Reviewed and updates received from RN.   Patient somnolent but easily aroused with verbal stimuli. Alert to self. Denies pain or discomfort. Continues to have poor appetite and high risk of aspiration. No family at the bedside.   Patient sharing that she is glad her pain is finally controlled and she feels at peace. States "I am ready to leave this earth and go be with my husband. He told me last night he was waiting on me!" She states she has been miserable a long time! Therapeutic listening and support provided.   Patient's friend Tina Salinas called and updates provided. Education provided on comfort and hospice home transfer once bed available.   All questions answered and support provided.   Length of Stay: 13 days  Vital Signs: BP 116/66 (BP Location: Left Arm)   Pulse 77   Temp (!) 97.3 F (36.3 C) (Oral)   Resp 18   Ht 5' (1.524 m)   Wt 43 kg   SpO2 100%   BMI 18.51 kg/m  SpO2: SpO2: 100 % O2 Device: O2 Device: Room Air O2 Flow Rate: O2 Flow Rate (L/min): 2 L/min  Physical Exam: Somnolent, chronically-ill appearing Normal breathing pattern Alert to self only              Palliative Care Assessment & Plan   Code Status: DNR  Goals of Care/Recommendations: Continue full comfort care measurs Pending transfer to residential hospice facility once bed available.  Tina Salinas updated and education provided on comfort and transfer process once bed available.  PMT will continue to support and follow as needed.   Prognosis: < 2 weeks  Discharge Planning: Hospice facility  Thank you for allowing  the Palliative Medicine Team to assist in the care of this patient.  Time Total: 35 min.   Visit consisted of counseling and education dealing with the complex and emotionally intense issues of symptom management and palliative care in the setting of serious and potentially life-threatening illness.Greater than 50%  of this time was spent counseling and coordinating care related to the above assessment and plan.  Alda Lea, AGPCNP-BC  Palliative Medicine Team [phone]

## 2020-05-10 NOTE — TOC Progression Note (Signed)
Transition of Care [facility]) - Progression Note    Patient Details  Name: ZAMORIA BOSS MRN: [id number] Date of Birth: February 09, 1946  [facility] CM/SW Contact  Beverly Sessions, RN Phone Number: 05/10/2020, 9:23 AM  Clinical Narrative:     Per Kieth Brightly with [facility] no residential hospice be at this time   Expected Discharge Plan: [place] Barriers to Discharge: ED Patient Insisting on an Alternate Living Situation/Facility  Expected Discharge Plan and Services Expected Discharge Plan: [place] In-house Referral: Clinical Social Work   Post Acute Care Choice: Nursing Home Living arrangements for the past 2 months: Single Family Home                                       Social Determinants of Health (SDOH) Interventions    Readmission Risk Interventions No flowsheet data found.

## 2020-05-10 NOTE — TOC Transition Note (Signed)
Transition of Care [facility]) - CM/SW Discharge Note   Patient Details  Name: Tina Salinas MRN: [id number] Date of Birth: 21-May-1946  [facility] CM/SW Contact:  Beverly Sessions, RN Phone Number: 05/10/2020, 3:04 PM   Clinical Narrative:      Patient to discharge to the hospice home of [facility] today EMS packet printed.  EM[facility]   Bedside RN has called report to the hospice home  Voicemail let for Roni with APS to update  Final next level of care: [place] Barriers to Discharge: No Barriers Identified   Patient Goals and CMS Choice Patient states their goals for this hospitalization and ongoing recovery are:: Patient stated she is no longer able to care for herself.  She spoke with Raymond Gurney APS/DDS and requested assistance w/ LTC placement. CMS Medicare.gov Compare Post Acute Care list provided to:: Patient Represenative (must comment) ([place] APS/DSS) Choice offered to / list presented to : NA ([place] APS/DSS)  Discharge Placement                Patient to be transferred to facility by: EMS      Discharge Plan and Services In-house Referral: Clinical Social Work   Post Acute Care Choice: Nursing Home                               Social Determinants of Health (SDOH) Interventions     Readmission Risk Interventions Readmission Risk Prevention Plan 05/10/2020  Transportation Screening Complete  Medication Review ([place]) Complete  PCP or Specialist appointment within 3-5 days of discharge (No Data)  [place] or [place] (No Data)  [place] (No Data)  Some recent data might be hidden

## 2020-05-10 NOTE — Care Management Important Message (Signed)
Important Message  Patient Details  Name: Tina Salinas MRN: [id number] Date of Birth: 02/06/1946   Medicare Important Message Given:  Yes     [name] 05/10/2020, 12:04 PM

## 2020-05-10 NOTE — Progress Notes (Signed)
PROGRESS NOTE    Tina Salinas  YHC:[id number] DOB: 1946/04/27 DOA: 04/21/2020 PCP: Birdie Sons, MD   Assessment & Plan:   Active Problems:   Adjustment disorder with depressed mood   Infantile cerebral palsy (HCC)   Chronic combined systolic and diastolic CHF (congestive heart failure) (HCC)   Malnutrition of moderate degree   Hypokalemia   Pressure injury of skin   Sepsis ([place])   Goals of care, counseling/discussion   Palliative care by specialist   DNR (do not resuscitate)   Comfort measures only status  Failure to thrive: secondary to all below. Continue w/ comfort care only. Continue on dilaudid, oxycodone prn. Palliative care following and recs apprec   Suspected sepsis: likely secondary to sacral decubitus. Sepsis resolved.  Urine cx NGTD. Blood cxs NGTD  Lactic acidosis: resolved  Stage III sacral ulcer: s/p I&D on 04/28/20. Wound cx grew pseudomonas. Comfort care only   RUE DVT: comfort care only   Fungal cystitis: completed anti-fungal course   Hypokalemia: within normal limits. Will continue to monitor  Dysphagia: continue on dysphagia diet as tolerated   Diarrhea: GI PCR panel is neg. Imodium prn   Acute on chronic systolic CHF: s/p IV lasix. Echo showed EF <20%.   CAD: cardiac cath done on 9/21 showed nonobstructive coronary artery disease.  PAF: comfort care only   HTN: comfort care only   Hx of rheumatoid arthritis: continue on prednisone  Adjustment disorder with depressed mood: continue on celexa.   Hyperlipidemia: comfort care only   Infantile cerebral palsy: bedridden. Continue w/ supportive care    Severe malnutrition: BMI 18.51. Continue w/ nutritional supplements    Thrombocytosis: resolved  ACD: no need for a transfusion at this.   DVT prophylaxis: comfort care only  Code Status: DNR Family Communication:  Disposition Plan: unclear as pt is on comfort care only   Status is: Inpatient  Remains inpatient  appropriate because:Unsafe d/c plan and IV treatments appropriate due to intensity of illness or inability to take PO  Continue w/ comfort care    Dispo: The patient is from: Home              Anticipated d/c is to: unclear, continue w/ comfort care only               Anticipated d/c date is: > 3 days              Patient currently is not medically stable to d/c.    Consultants:   General surg   Chaplain   Palliative care    Procedures:    Antimicrobials: zosyn    Subjective: Pt is still lethargic but able to answer questions intermittently    Objective: Vitals:   05/08/20 1258 05/08/20 2029 05/09/20 0923 05/09/20 2325  BP: 102/77 106/68 120/80 113/61  Pulse: 77 85 80 72  Resp: 15 20 20 16   Temp: 98.5 F (36.9 C) 97.8 F (36.6 C) 98 F (36.7 C) 98 F (36.7 C)  TempSrc: Oral Oral Oral   SpO2: 100% 100% 100% 99%  Weight:      Height:        Intake/Output Summary (Last 24 hours) at 05/10/2020 0716 Last data filed at 05/09/2020 2113 Gross per 24 hour  Intake 10 ml  Output 0 ml  Net 10 ml   Filed Weights   04/21/20 1238 04/28/20 1251  Weight: 43 kg 43 kg    Examination:  General exam: Appears lethargic  Respiratory  system: diminished breath sounds b/l  Cardiovascular system: S1/S2+. No rubs or gallops Gastrointestinal system: Abdomen is nondistended, soft and nontender. Hypoactive bowel sounds  Central nervous system: Oriented to person only. Moves all extremities  Psychiatry: judgement and insight appear abnormal. Flat mood and affect     Data Reviewed: I have personally reviewed following labs and imaging studies  CBC: Recent Labs  Lab 05/05/20 0510 05/06/20 0542 05/07/20 0345  WBC 10.2 8.2 8.2  HGB 7.5* 7.5* 8.1*  HCT 25.8* 25.3* 28.3*  MCV 97.7 98.4 97.9  PLT 516* 370 979   Basic Metabolic Panel: Recent Labs  Lab 05/05/20 0510 05/06/20 0542 05/07/20 0345  NA 141 144 142  K 4.4 3.7 3.2*  CL 109 112* 111  CO2 22 19* 22  GLUCOSE  90 54* 140*  BUN 22 18 16   CREATININE 1.06* 0.89 0.68  CALCIUM 7.8* 7.4* 7.5*   GFR: Estimated Creatinine Clearance: 43.1 mL/min (by C-G formula based on SCr of 0.68 mg/dL). Liver Function Tests: No results for input(s): AST, ALT, ALKPHOS, BILITOT, PROT, ALBUMIN in the last 168 hours. No results for input(s): LIPASE, AMYLASE in the last 168 hours. No results for input(s): AMMONIA in the last 168 hours. Coagulation Profile: No results for input(s): INR, PROTIME in the last 168 hours. Cardiac Enzymes: No results for input(s): CKTOTAL, CKMB, CKMBINDEX, TROPONINI in the last 168 hours. BNP (last 3 results) No results for input(s): PROBNP in the last 8760 hours. HbA1C: No results for input(s): HGBA1C in the last 72 hours. CBG: Recent Labs  Lab 05/06/20 0733 05/06/20 0804  GLUCAP 44* 183*   Lipid Profile: No results for input(s): CHOL, HDL, LDLCALC, TRIG, CHOLHDL, LDLDIRECT in the last 72 hours. Thyroid Function Tests: No results for input(s): TSH, T4TOTAL, FREET4, T3FREE, THYROIDAB in the last 72 hours. Anemia Panel: No results for input(s): VITAMINB12, FOLATE, FERRITIN, TIBC, IRON, RETICCTPCT in the last 72 hours. Sepsis Labs: No results for input(s): PROCALCITON, LATICACIDVEN in the last 168 hours.  No results found for this or any previous visit (from the past 240 hour(s)).       Radiology Studies: No results found.      Scheduled Meds: . citalopram  10 mg Oral Daily  . docusate sodium  100 mg Oral BID  . feeding supplement  237 mL Oral TID BM  . hydroxychloroquine  400 mg Oral Daily  . latanoprost  1 drop Both Eyes QHS  . mirtazapine  15 mg Oral QHS  . oxyCODONE  5 mg Oral Q6H  . polyethylene glycol  17 g Oral Daily  . predniSONE  2.5 mg Oral Q breakfast  . sodium chloride flush  10-40 mL Intracatheter Q12H   Continuous Infusions: . sodium chloride Stopped (05/07/20 0930)     LOS: 13 days    Time spent: 20 mins     Wyvonnia Dusky, MD [facility] Pager [phone]  If 7PM-7AM, please contact night-coverage [email or web address] 05/10/2020, 7:16 AM

## 2020-06-14 ENCOUNTER — Encounter: Payer: Medicare HMO | Admitting: Family Medicine

## 2020-06-16 DEATH — deceased
